# Patient Record
Sex: Male | Born: 1975
Health system: Southern US, Community
[De-identification: ages and names within clinical notes are randomized; demographics above are authoritative.]

## PROBLEM LIST (undated history)

## (undated) DIAGNOSIS — D61818 Other pancytopenia: Secondary | ICD-10-CM

## (undated) DIAGNOSIS — E11621 Type 2 diabetes mellitus with foot ulcer: Secondary | ICD-10-CM

## (undated) DIAGNOSIS — T1491XA Suicide attempt, initial encounter: Secondary | ICD-10-CM

## (undated) DIAGNOSIS — F99 Mental disorder, not otherwise specified: Secondary | ICD-10-CM

## (undated) DIAGNOSIS — F32A Depression, unspecified: Secondary | ICD-10-CM

## (undated) DIAGNOSIS — L97511 Non-pressure chronic ulcer of other part of right foot limited to breakdown of skin: Secondary | ICD-10-CM

## (undated) DIAGNOSIS — E119 Type 2 diabetes mellitus without complications: Secondary | ICD-10-CM

## (undated) DIAGNOSIS — F329 Major depressive disorder, single episode, unspecified: Secondary | ICD-10-CM

## (undated) DIAGNOSIS — K219 Gastro-esophageal reflux disease without esophagitis: Secondary | ICD-10-CM

## (undated) DIAGNOSIS — K7460 Unspecified cirrhosis of liver: Secondary | ICD-10-CM

## (undated) DIAGNOSIS — M869 Osteomyelitis, unspecified: Secondary | ICD-10-CM

## (undated) DIAGNOSIS — L0390 Cellulitis, unspecified: Secondary | ICD-10-CM

## (undated) DIAGNOSIS — L02611 Cutaneous abscess of right foot: Secondary | ICD-10-CM

## (undated) DIAGNOSIS — L97509 Non-pressure chronic ulcer of other part of unspecified foot with unspecified severity: Secondary | ICD-10-CM

## (undated) DIAGNOSIS — L03115 Cellulitis of right lower limb: Secondary | ICD-10-CM

## (undated) DIAGNOSIS — E1100 Type 2 diabetes mellitus with hyperosmolarity without nonketotic hyperglycemic-hyperosmolar coma (NKHHC): Secondary | ICD-10-CM

## (undated) DIAGNOSIS — T300 Burn of unspecified body region, unspecified degree: Secondary | ICD-10-CM

## (undated) DIAGNOSIS — E11628 Type 2 diabetes mellitus with other skin complications: Secondary | ICD-10-CM

## (undated) DIAGNOSIS — F1010 Alcohol abuse, uncomplicated: Secondary | ICD-10-CM

## (undated) DIAGNOSIS — K7010 Alcoholic hepatitis without ascites: Secondary | ICD-10-CM

## (undated) HISTORY — PX: LEG SURGERY: SHX1003

## (undated) HISTORY — DX: Suicide attempt, initial encounter: T14.91XA

## (undated) HISTORY — PX: SKIN GRAFT: SHX250

---
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
## 2009-09-25 ENCOUNTER — Emergency Department (HOSPITAL_COMMUNITY): Admission: EM | Admit: 2009-09-25 | Discharge: 2009-09-25 | Payer: Self-pay | Admitting: Emergency Medicine

## 2010-12-16 ENCOUNTER — Inpatient Hospital Stay (HOSPITAL_COMMUNITY)
Admission: EM | Admit: 2010-12-16 | Discharge: 2010-12-21 | DRG: 540 | Disposition: A | Discharge status: Home / Self Care | Payer: Medicaid Other | Attending: Internal Medicine | Admitting: Internal Medicine

## 2010-12-16 ENCOUNTER — Encounter: Payer: Self-pay | Admitting: Emergency Medicine

## 2010-12-16 DIAGNOSIS — L02619 Cutaneous abscess of unspecified foot: Secondary | ICD-10-CM | POA: Diagnosis present

## 2010-12-16 DIAGNOSIS — R7402 Elevation of levels of lactic acid dehydrogenase (LDH): Secondary | ICD-10-CM | POA: Diagnosis present

## 2010-12-16 DIAGNOSIS — L03115 Cellulitis of right lower limb: Secondary | ICD-10-CM

## 2010-12-16 DIAGNOSIS — T148XXA Other injury of unspecified body region, initial encounter: Secondary | ICD-10-CM

## 2010-12-16 DIAGNOSIS — W19XXXA Unspecified fall, initial encounter: Secondary | ICD-10-CM | POA: Diagnosis present

## 2010-12-16 DIAGNOSIS — S2020XA Contusion of thorax, unspecified, initial encounter: Secondary | ICD-10-CM | POA: Diagnosis present

## 2010-12-16 DIAGNOSIS — E876 Hypokalemia: Secondary | ICD-10-CM | POA: Diagnosis not present

## 2010-12-16 DIAGNOSIS — K7010 Alcoholic hepatitis without ascites: Secondary | ICD-10-CM | POA: Diagnosis present

## 2010-12-16 DIAGNOSIS — T65891A Toxic effect of other specified substances, accidental (unintentional), initial encounter: Secondary | ICD-10-CM | POA: Diagnosis present

## 2010-12-16 DIAGNOSIS — R7401 Elevation of levels of liver transaminase levels: Secondary | ICD-10-CM | POA: Diagnosis present

## 2010-12-16 DIAGNOSIS — F10939 Alcohol use, unspecified with withdrawal, unspecified: Secondary | ICD-10-CM | POA: Diagnosis present

## 2010-12-16 DIAGNOSIS — A4901 Methicillin susceptible Staphylococcus aureus infection, unspecified site: Secondary | ICD-10-CM | POA: Diagnosis present

## 2010-12-16 DIAGNOSIS — F1020 Alcohol dependence, uncomplicated: Secondary | ICD-10-CM | POA: Diagnosis present

## 2010-12-16 DIAGNOSIS — J9819 Other pulmonary collapse: Secondary | ICD-10-CM | POA: Diagnosis present

## 2010-12-16 DIAGNOSIS — K2920 Alcoholic gastritis without bleeding: Secondary | ICD-10-CM | POA: Diagnosis present

## 2010-12-16 DIAGNOSIS — E871 Hypo-osmolality and hyponatremia: Secondary | ICD-10-CM | POA: Diagnosis not present

## 2010-12-16 DIAGNOSIS — F1010 Alcohol abuse, uncomplicated: Secondary | ICD-10-CM | POA: Diagnosis present

## 2010-12-16 DIAGNOSIS — T510X4A Toxic effect of ethanol, undetermined, initial encounter: Secondary | ICD-10-CM | POA: Diagnosis present

## 2010-12-16 DIAGNOSIS — D6959 Other secondary thrombocytopenia: Secondary | ICD-10-CM | POA: Diagnosis present

## 2010-12-16 DIAGNOSIS — M869 Osteomyelitis, unspecified: Principal | ICD-10-CM | POA: Diagnosis present

## 2010-12-16 DIAGNOSIS — F10239 Alcohol dependence with withdrawal, unspecified: Secondary | ICD-10-CM | POA: Diagnosis present

## 2010-12-16 HISTORY — DX: Gastro-esophageal reflux disease without esophagitis: K21.9

## 2010-12-16 HISTORY — DX: Burn of unspecified body region, unspecified degree: T30.0
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
## 2010-12-16 NOTE — ED Notes (Signed)
Spanish speaker states bleeding of right foot x 3 wks getting worse.  Denies injury.  Surgery 18 years ago to same foot. Feels bad.

## 2010-12-16 NOTE — ED Notes (Signed)
Pt has an ulcer noted on the bottom of his right foot at the ball of the foot and a large scar to the bottom of the foot in the middle  Bleeding noted

## 2010-12-17 ENCOUNTER — Emergency Department (HOSPITAL_COMMUNITY): Payer: Medicaid Other

## 2010-12-17 ENCOUNTER — Observation Stay (HOSPITAL_COMMUNITY): Payer: Medicaid Other

## 2010-12-17 ENCOUNTER — Other Ambulatory Visit: Payer: Self-pay | Admitting: Orthopedic Surgery

## 2010-12-17 ENCOUNTER — Encounter (HOSPITAL_COMMUNITY): Payer: Self-pay | Admitting: Emergency Medicine

## 2010-12-17 DIAGNOSIS — L03115 Cellulitis of right lower limb: Secondary | ICD-10-CM

## 2010-12-17 DIAGNOSIS — M869 Osteomyelitis, unspecified: Secondary | ICD-10-CM | POA: Diagnosis present

## 2010-12-17 LAB — DIFFERENTIAL
Basophils Relative: 1 % (ref 0–1)
Eosinophils Absolute: 0.1 10*3/uL (ref 0.0–0.7)
Lymphocytes Relative: 32 % (ref 12–46)
Monocytes Absolute: 0.3 10*3/uL (ref 0.1–1.0)
Neutrophils Relative %: 58 % (ref 43–77)

## 2010-12-17 LAB — CBC
Hemoglobin: 14.4 g/dL (ref 13.0–17.0)
MCH: 34.6 pg — ABNORMAL HIGH (ref 26.0–34.0)
MCHC: 35.9 g/dL (ref 30.0–36.0)
Platelets: 61 10*3/uL — ABNORMAL LOW (ref 150–400)
RBC: 4.16 MIL/uL — ABNORMAL LOW (ref 4.22–5.81)

## 2010-12-17 LAB — HEPATIC FUNCTION PANEL
ALT: 48 U/L (ref 0–53)
AST: 110 U/L — ABNORMAL HIGH (ref 0–37)
Bilirubin, Direct: 0.3 mg/dL (ref 0.0–0.3)
Total Bilirubin: 0.7 mg/dL (ref 0.3–1.2)

## 2010-12-17 LAB — BASIC METABOLIC PANEL
Calcium: 8.1 mg/dL — ABNORMAL LOW (ref 8.4–10.5)
GFR calc Af Amer: >90 mL/min (ref >90)
GFR calc non Af Amer: >90 mL/min (ref >90)
Glucose, Bld: 148 mg/dL — ABNORMAL HIGH (ref 70–99)
Potassium: 3.1 mEq/L — ABNORMAL LOW (ref 3.5–5.1)
Sodium: 137 mEq/L (ref 135–145)

## 2010-12-17 LAB — VITAMIN B12: Vitamin B-12: 1520 pg/mL — ABNORMAL HIGH (ref 211–911)

## 2010-12-17 LAB — MRSA PCR SCREENING: MRSA by PCR: NEGATIVE

## 2010-12-17 LAB — GLUCOSE, CAPILLARY: Glucose-Capillary: 138 mg/dL — ABNORMAL HIGH (ref 70–99)

## 2010-12-17 LAB — HIV ANTIBODY (ROUTINE TESTING W REFLEX): HIV: NONREACTIVE

## 2010-12-17 MED ORDER — SODIUM CHLORIDE 0.9 % IJ SOLN: 3.0000 mL | INTRAMUSCULAR | Status: DC | PRN

## 2010-12-17 MED ORDER — POTASSIUM CHLORIDE CRYS ER 20 MEQ PO TBCR
40.0000 meq | EXTENDED_RELEASE_TABLET | Freq: Once | ORAL | Status: AC
  Administered 2010-12-17: 40 meq via ORAL
  Filled 2010-12-17: qty 1

## 2010-12-17 MED ORDER — VANCOMYCIN HCL IN DEXTROSE 1-5 GM/200ML-% IV SOLN
1000.0000 mg | INTRAVENOUS | Status: AC
  Administered 2010-12-17: 1000 mg via INTRAVENOUS
  Filled 2010-12-17 (×2): qty 200

## 2010-12-17 MED ORDER — THERA M PLUS PO TABS
1.0000 | ORAL_TABLET | Freq: Every day | ORAL | Status: DC
  Administered 2010-12-17 – 2010-12-21 (×5): 1 via ORAL
  Filled 2010-12-17 (×6): qty 1

## 2010-12-17 MED ORDER — PIPERACILLIN-TAZOBACTAM 3.375 G IVPB
3.3750 g | Freq: Three times a day (TID) | INTRAVENOUS | Status: DC
  Administered 2010-12-17 – 2010-12-21 (×12): 3.375 g via INTRAVENOUS
  Filled 2010-12-17 (×15): qty 50

## 2010-12-17 MED ORDER — ENOXAPARIN SODIUM 40 MG/0.4ML ~~LOC~~ SOLN
40.0000 mg | SUBCUTANEOUS | Status: DC
  Filled 2010-12-17: qty 0.4

## 2010-12-17 MED ORDER — NICOTINE 14 MG/24HR TD PT24
14.0000 mg | MEDICATED_PATCH | Freq: Every day | TRANSDERMAL | Status: DC
  Administered 2010-12-18 – 2010-12-21 (×4): 14 mg via TRANSDERMAL
  Filled 2010-12-17 (×5): qty 1

## 2010-12-17 MED ORDER — MORPHINE SULFATE 4 MG/ML IJ SOLN
4.0000 mg | Freq: Once | INTRAMUSCULAR | Status: AC
  Administered 2010-12-17: 4 mg via INTRAVENOUS
  Filled 2010-12-17 (×2): qty 1

## 2010-12-17 MED ORDER — CHLORDIAZEPOXIDE HCL 25 MG PO CAPS: 25.0000 mg | ORAL_CAPSULE | Freq: Three times a day (TID) | ORAL | Status: DC

## 2010-12-17 MED ORDER — PIPERACILLIN-TAZOBACTAM 3.375 G IVPB 30 MIN
3.3750 g | INTRAVENOUS | Status: AC
  Administered 2010-12-17: 3.375 g via INTRAVENOUS
  Filled 2010-12-17: qty 50

## 2010-12-17 MED ORDER — ACETAMINOPHEN 650 MG RE SUPP: 650.0000 mg | Freq: Four times a day (QID) | RECTAL | Status: DC | PRN

## 2010-12-17 MED ORDER — ONDANSETRON HCL 4 MG PO TABS: 4.0000 mg | ORAL_TABLET | Freq: Four times a day (QID) | ORAL | Status: DC | PRN

## 2010-12-17 MED ORDER — LORAZEPAM 1 MG PO TABS
0.0000 mg | ORAL_TABLET | Freq: Four times a day (QID) | ORAL | Status: AC
  Administered 2010-12-17 (×2): 2 mg via ORAL
  Administered 2010-12-17 – 2010-12-19 (×5): 1 mg via ORAL
  Filled 2010-12-17: qty 2

## 2010-12-17 MED ORDER — PANTOPRAZOLE SODIUM 40 MG PO TBEC
40.0000 mg | DELAYED_RELEASE_TABLET | Freq: Every day | ORAL | Status: DC
  Administered 2010-12-18 – 2010-12-21 (×4): 40 mg via ORAL
  Filled 2010-12-17 (×6): qty 1

## 2010-12-17 MED ORDER — GADOBENATE DIMEGLUMINE 529 MG/ML IV SOLN
15.0000 mL | Freq: Once | INTRAVENOUS | Status: AC | PRN
  Administered 2010-12-17: 15 mL via INTRAVENOUS

## 2010-12-17 MED ORDER — LORAZEPAM 1 MG PO TABS
1.0000 mg | ORAL_TABLET | Freq: Four times a day (QID) | ORAL | Status: AC | PRN
  Administered 2010-12-18: 1 mg via ORAL
  Filled 2010-12-17 (×5): qty 1
  Filled 2010-12-17: qty 2
  Filled 2010-12-17 (×3): qty 1

## 2010-12-17 MED ORDER — FOLIC ACID 1 MG PO TABS
1.0000 mg | ORAL_TABLET | Freq: Every day | ORAL | Status: DC
  Administered 2010-12-18 – 2010-12-21 (×4): 1 mg via ORAL
  Filled 2010-12-17 (×5): qty 1

## 2010-12-17 MED ORDER — ONDANSETRON HCL 4 MG/2ML IJ SOLN: 4.0000 mg | Freq: Four times a day (QID) | INTRAMUSCULAR | Status: DC | PRN

## 2010-12-17 MED ORDER — CLINDAMYCIN HCL 150 MG PO CAPS: 150.0000 mg | ORAL_CAPSULE | Freq: Four times a day (QID) | ORAL | Status: DC

## 2010-12-17 MED ORDER — ALUM & MAG HYDROXIDE-SIMETH 200-200-20 MG/5ML PO SUSP
30.0000 mL | Freq: Four times a day (QID) | ORAL | Status: DC | PRN
  Administered 2010-12-17: 30 mL via ORAL
  Filled 2010-12-17: qty 30

## 2010-12-17 MED ORDER — LORAZEPAM 1 MG PO TABS
0.0000 mg | ORAL_TABLET | Freq: Two times a day (BID) | ORAL | Status: AC
  Administered 2010-12-19 – 2010-12-20 (×3): 1 mg via ORAL
  Filled 2010-12-17: qty 1

## 2010-12-17 MED ORDER — TETANUS-DIPHTH-ACELL PERTUSSIS 5-2.5-18.5 LF-MCG/0.5 IM SUSP
0.5000 mL | Freq: Once | INTRAMUSCULAR | Status: AC
  Administered 2010-12-17: 0.5 mL via INTRAMUSCULAR
  Filled 2010-12-17: qty 0.5

## 2010-12-17 MED ORDER — MORPHINE SULFATE 2 MG/ML IJ SOLN: 2.0000 mg | INTRAMUSCULAR | Status: DC | PRN

## 2010-12-17 MED ORDER — FOLIC ACID 1 MG PO TABS: 1.0000 mg | ORAL_TABLET | Freq: Every day | ORAL | Status: DC

## 2010-12-17 MED ORDER — ACETAMINOPHEN 325 MG PO TABS: 650.0000 mg | ORAL_TABLET | Freq: Four times a day (QID) | ORAL | Status: DC | PRN

## 2010-12-17 MED ORDER — OXYCODONE-ACETAMINOPHEN 5-325 MG PO TABS: 1.0000 | ORAL_TABLET | ORAL | Status: AC | PRN

## 2010-12-17 MED ORDER — LORAZEPAM 2 MG/ML IJ SOLN: 1.0000 mg | Freq: Four times a day (QID) | INTRAMUSCULAR | Status: AC | PRN

## 2010-12-17 MED ORDER — VANCOMYCIN HCL IN DEXTROSE 1-5 GM/200ML-% IV SOLN
1000.0000 mg | Freq: Once | INTRAVENOUS | Status: AC
  Administered 2010-12-17: 1000 mg via INTRAVENOUS
  Filled 2010-12-17: qty 200

## 2010-12-17 MED ORDER — VANCOMYCIN HCL IN DEXTROSE 1-5 GM/200ML-% IV SOLN
1000.0000 mg | Freq: Three times a day (TID) | INTRAVENOUS | Status: DC
  Administered 2010-12-17 – 2010-12-19 (×5): 1000 mg via INTRAVENOUS
  Filled 2010-12-17 (×8): qty 200

## 2010-12-17 MED ORDER — THIAMINE HCL 100 MG/ML IJ SOLN
100.0000 mg | Freq: Every day | INTRAMUSCULAR | Status: DC
  Filled 2010-12-17 (×5): qty 2

## 2010-12-17 MED ORDER — HYDROCODONE-ACETAMINOPHEN 5-325 MG PO TABS
1.0000 | ORAL_TABLET | ORAL | Status: DC | PRN
  Administered 2010-12-17 – 2010-12-20 (×3): 2 via ORAL
  Filled 2010-12-17 (×3): qty 2

## 2010-12-17 MED ORDER — VITAMIN B-1 100 MG PO TABS
100.0000 mg | ORAL_TABLET | Freq: Every day | ORAL | Status: DC
  Administered 2010-12-18 – 2010-12-21 (×4): 100 mg via ORAL
  Filled 2010-12-17 (×5): qty 1

## 2010-12-17 MED ORDER — VITAMIN B-1 100 MG PO TABS: 100.0000 mg | ORAL_TABLET | Freq: Every day | ORAL | Status: DC

## 2010-12-17 MED ORDER — CIPROFLOXACIN HCL 500 MG PO TABS: 500.0000 mg | ORAL_TABLET | Freq: Two times a day (BID) | ORAL | Status: DC
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
## 2010-12-17 NOTE — ED Notes (Signed)
Radiology staff in ED, asked about MRI, was told Mitchell [name] was called in and would be here in about 30 minutes, will monitor.
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
## 2010-12-17 NOTE — ED Notes (Signed)
Called report to Lyda Perone, RN, awaiting STAT MR of R foot prior to transferring pt, attempted to call MRI and Radiology but no answer, will inform Diane, Consulting civil engineer and monitor.

## 2010-12-17 NOTE — ED Notes (Signed)
Collected first set of blood cultures in the right forearm vein

## 2010-12-17 NOTE — ED Notes (Signed)
Pt transported to 1538 accompanied by [place], NT with meds, chart and personal belongings, condition stable at time of transfer.
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
## 2010-12-17 NOTE — Progress Notes (Signed)
Admission Note: Patient speaks spanish and no english. RN used [employer] and spanish interpretor to complete admission assessment and complete floor introduction. Sheran Luz RN BSN
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
## 2010-12-17 NOTE — ED Notes (Signed)
Pt. had second set of blood cultures drawn. Blood was drawn out of the right hand with three cc in both the bottles.

## 2010-12-18 LAB — CBC
HCT: 40.8 % (ref 39.0–52.0)
MCHC: 34.8 g/dL (ref 30.0–36.0)
Platelets: 64 10*3/uL — ABNORMAL LOW (ref 150–400)
RDW: 12.9 % (ref 11.5–15.5)
WBC: 3.9 10*3/uL — ABNORMAL LOW (ref 4.0–10.5)

## 2010-12-18 LAB — BASIC METABOLIC PANEL
BUN: 4 mg/dL — ABNORMAL LOW (ref 6–23)
Chloride: 96 mEq/L (ref 96–112)
Creatinine, Ser: 0.44 mg/dL — ABNORMAL LOW (ref 0.50–1.35)
GFR calc Af Amer: >90 mL/min (ref >90)
GFR calc non Af Amer: >90 mL/min (ref >90)

## 2010-12-18 LAB — HEPATIC FUNCTION PANEL
AST: 117 U/L — ABNORMAL HIGH (ref 0–37)
Albumin: 3.1 g/dL — ABNORMAL LOW (ref 3.5–5.2)
Total Protein: 8.4 g/dL — ABNORMAL HIGH (ref 6.0–8.3)

## 2010-12-18 LAB — MAGNESIUM: Magnesium: 1.8 mg/dL (ref 1.5–2.5)

## 2010-12-18 MED ORDER — MAGNESIUM OXIDE 400 MG PO TABS
200.0000 mg | ORAL_TABLET | Freq: Every day | ORAL | Status: DC
  Administered 2010-12-18 – 2010-12-21 (×4): 200 mg via ORAL
  Filled 2010-12-18 (×6): qty 0.5

## 2010-12-18 MED ORDER — POTASSIUM CHLORIDE CRYS ER 20 MEQ PO TBCR
40.0000 meq | EXTENDED_RELEASE_TABLET | Freq: Every day | ORAL | Status: DC
  Administered 2010-12-18 – 2010-12-21 (×4): 40 meq via ORAL
  Filled 2010-12-18 (×5): qty 2

## 2010-12-18 MED ORDER — POTASSIUM CHLORIDE CRYS ER 20 MEQ PO TBCR
40.0000 meq | EXTENDED_RELEASE_TABLET | Freq: Once | ORAL | Status: AC
  Administered 2010-12-18: 40 meq via ORAL
  Filled 2010-12-18: qty 2

## 2010-12-18 MED ORDER — POTASSIUM CHLORIDE 10 MEQ/100ML IV SOLN
10.0000 meq | INTRAVENOUS | Status: AC
  Administered 2010-12-18 (×3): 10 meq via INTRAVENOUS
  Filled 2010-12-18 (×3): qty 100

## 2010-12-18 NOTE — Progress Notes (Signed)
Subjective: Pain right foot better. No nausea or vomiting , feeling ok.  Objective: Filed Vitals:   12/17/10 2130 12/18/10 0145 12/18/10 0500 12/18/10 1200  BP: 124/76 120/77 125/84 127/86  Pulse: 80 78 81 89  Temp: 98.9 F (37.2 C) 98.4 F (36.9 C) 98.6 F (37 C) 98.4 F (36.9 C)  TempSrc: Oral Oral Oral Oral  Resp: 18 18 18 18   Height:      Weight:      SpO2: 98% 95% 98% 97%   Weight change:   Intake/Output Summary (Last 24 hours) at 12/18/10 1244 Last data filed at 12/18/10 0600  Gross per 24 hour  Intake   1150 ml  Output    150 ml  Net   1000 ml    General: Alert, awake, oriented x3, in no acute distress.  HEENT: No bruits, no goiter.  Heart: Regular rate and rhythm, without murmurs, rubs, gallops.  Lungs: Crackles left side, bilateral air movement.  Abdomen: Soft, nontender, nondistended, positive bowel sounds.  Neuro: Grossly intact, nonfocal. Extremities: Right foot swelling decrease, redness has decrease.    Lab Results:  [facility] 12/18/10 0420 12/17/10 0210  NA 133* 137  K 2.9* 3.1*  CL 96 100  CO2 29 26  GLUCOSE 154* 148*  BUN 4* 4*  CREATININE 0.44* 0.44*  CALCIUM 8.6 8.1*  MG 1.8 --  PHOS -- --    Basename 12/18/10 0420 12/17/10 0555  AST 117* 110*  ALT 47 48  ALKPHOS 93 92  BILITOT 1.4* 0.7  PROT 8.4* 8.3  ALBUMIN 3.1* 3.1*   No results found for this basename: LIPASE:2,AMYLASE:2 in the last 72 hours  Basename 12/18/10 0420 12/17/10 0210  WBC 3.9* 4.6  NEUTROABS -- 2.7  HGB 14.2 14.4  HCT 40.8 40.1  MCV 98.1 96.4  PLT 64* 61*    Basename 12/17/10 1431  VITAMINB12 1520*  FOLATE --  FERRITIN --  TIBC --  IRON --  RETICCTPCT --    Micro Results: Recent Results (from the past 240 hour(s))  CULTURE, BLOOD (ROUTINE X 2)     Status: Normal (Preliminary result)   Collection Time   12/17/10 10:23 AM      Component Value Range Status Comment   Specimen Description BLOOD RIGHT FOREARM   Final    Special Requests BOTTLES DRAWN  AEROBIC AND ANAEROBIC 5 CC EACH   Final    Setup Time [id number]   Final    Culture     Final    Value:        BLOOD CULTURE RECEIVED NO GROWTH TO DATE CULTURE WILL BE HELD FOR 5 DAYS BEFORE ISSUING A FINAL NEGATIVE REPORT   Report Status PENDING   Incomplete   CULTURE, BLOOD (ROUTINE X 2)     Status: Normal (Preliminary result)   Collection Time   12/17/10 11:00 AM      Component Value Range Status Comment   Specimen Description BLOOD   Final    Special Requests BOTTLES DRAWN AEROBIC AND ANAEROBIC 3 CC EACH   Final    Setup Time [id number]   Final    Culture     Final    Value:        BLOOD CULTURE RECEIVED NO GROWTH TO DATE CULTURE WILL BE HELD FOR 5 DAYS BEFORE ISSUING A FINAL NEGATIVE REPORT   Report Status PENDING   Incomplete   WOUND CULTURE     Status: Normal (Preliminary result)   Collection Time  12/17/10  3:16 PM      Component Value Range Status Comment   Specimen Description FOOT   Final    Special Requests Normal   Final    Gram Stain     Final    Value: NO WBC SEEN     NO SQUAMOUS EPITHELIAL CELLS SEEN     NO ORGANISMS SEEN   Culture Culture reincubated for better growth   Final    Report Status PENDING   Incomplete   MRSA PCR SCREENING     Status: Normal   Collection Time   12/17/10  3:16 PM      Component Value Range Status Comment   MRSA by PCR NEGATIVE  NEGATIVE  Final     Studies/Results: Dg Chest 2 View  12/17/2010  *RADIOLOGY REPORT*  Clinical Data: Bruising, elevated liver enzymes.  CHEST - 2 VIEW  Comparison: None.  Findings: Hypoaeration results in interstitial crowding.  There is mild left lower lobe consolidation.  No pneumothorax.  Heart size mildly enlarged.  No acute osseous abnormality.  IMPRESSION: Hypoaeration results in interstitial crowding.  Mild left lower lobe opacity; atelectasis versus pneumonia.  Original Report Authenticated By: Waneta Martins, M.D.   Dg Abd 1 View  12/17/2010  *RADIOLOGY REPORT*  Clinical Data: Bruising,  tenderness, elevated liver enzyme.  ABDOMEN - 1 VIEW  Comparison: None.  Findings: Single frontal radiograph of the abdomen.  The upper abdomen/hemidiaphragms and the lower pelvis are excluded from the image.  Nonobstructive bowel gas pattern.  Organ outlines are normal where seen.  No acute osseous abnormality identified. Sacroiliac joints are intact.  The lower sacrum is obscured by overlying bowel.  The inferior pubic rami are not included on the image.  IMPRESSION: Nonobstructive bowel gas pattern.  Original Report Authenticated By: Waneta Martins, M.D.   Mr Foot Right W Wo Contrast  12/17/2010  *RADIOLOGY REPORT*  Clinical Data: Open wound plantar surface of the forefoot.  MRI OF THE RIGHT FOREFOOT WITHOUT AND WITH CONTRAST  Technique:  Multiplanar, multisequence MR imaging was performed both before and after administration of intravenous contrast.  Contrast: 15mL MULTIHANCE GADOBENATE DIMEGLUMINE 529 MG/ML IV SOLN  Comparison: Plain films right foot 12/17/2010 and 09/25/2009.  Findings: Fusion of the first MTP joint and synostosis across the distal first and second and second and third metatarsals is noted. Osseous fusion throughout the midfoot is also seen. The patient has severe talonavicular degenerative change.  There is a wound on the plantar surface of the forefoot with underlying enhancement compatible with cellulitis or granulation tissue.  Flattening of the plantar surfaces of the heads of the first, second and third metatarsals is noted and seen on the comparison plain films.  No focal fluid collection to suggest abscess is identified.  There is mild edema and post contrast enhancement in the plantar surface of the head of the first metatarsal. There also appears to be some soft tissue edema and enhancement subjacent to the nail of the great toe.  No focal fluid collection is identified in this location.  No definite osseous enhancement.  IMPRESSION:  1.  Plantar soft tissue wound with post  contrast enhancement compatible with the presence of granulation tissue or cellulitis. No abscess. 2.  Chronic erosions in the heads of the first, second and third metatarsals are likely pressure related.  Mild edema in the plantar surface of the head of the first metatarsal could be due to stress change or possibly osteomyelitis.  Stress change is  favored. 3.  Soft tissue edema and enhancement subjacent to the nail of the great toe may be due to cellulitis.  No abscess or convincing evidence of osteomyelitis. 4.  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  Osseous fusion of the midfoot and first MTP joint also seen.  Original Report Authenticated By: Bernadene Bell. D'ALESSIO, M.D.   Dg Foot Complete Right  12/17/2010  *RADIOLOGY REPORT*  Clinical Data: Right foot pain, swelling.  RIGHT FOOT COMPLETE - 3+ VIEW  Comparison: 09/25/2009  Findings: Diffuse osteopenia.  Multiple tarsal bone fusion, similar to prior.  Advanced midfoot and phalangeal degenerative changes. Large first metacarpal phalangeal joint spur, similar to prior. There is increased soft tissue swelling.  IMPRESSION: Increased soft tissue swelling.  Chronic osseous findings are similar to prior.  No definite acute osseous change.  Original Report Authenticated By: Waneta Martins, M.D.    Medications: I have reviewed the patient's current medications.   Patient Active Hospital Problem List:  1Right foot cellulitis: Chronic changes of osteomyelitis right foot.  Continue with Vancomycin and zosyn day 2 Appreciate ortho help. No surgery at this time. Patient will need to follow up with Dr Montez Morita in 1 week.  I will discharge patient on Ciprofloxacin and bactrim.  2Alcohol Abuse:  CIWA protocol. . Mild increase bilirubin and AST.  Thiamine and Folate.  3DVT Prophylaxis  SCD, No Lovenox due to thrombocytopenia.  4Thrombocytopenia: Likely secondary to alcohol use. Monitor.   5Nausea and vomiting :  Resolved. ? Due to alcoholic gastritis  Protonix ordered an anti-emetics  6Generalized torso bruising  After a fall.  the KUB and chest x-ray negative.  7Chest Xray atelectasis Vs PNA:  Already on antibiotics , facvor atelectasis.  8-Hypokalemia: will replete with kCL 10 meq IV times 3 run and 40 meq po times 2.  9-Transaminases: Likely secondary to alcohol, repeat in am. Will check PT.      LOS: 2 days   Lewellyn Fultz M.D.  [facility] 12/18/2010, 12:44 PM

## 2010-12-18 NOTE — Progress Notes (Signed)
Pt speaks Spanish, no other needs. Staff needs to use pacific due to holiday coverage.  Mitchell [name] 12/18/2010 8:50 AM

## 2010-12-18 NOTE — Consult Note (Signed)
WOC consult Note Reason for Consult:Right foot wound  Patient not seen in consultation as ortho consult requested simultaneously.  Dr. Myrtie Neither has seen and provided orders for conservative wound care in his note.  I will transcribe these to the nursing orders for continuing care until patient is seen by Dr. Montez Morita in his office in 1 week. I will not follow.  Please re-consult if needed. Thanks, Ladona Mow, MSN, RN, [facility], CWOCN [phone])

## 2010-12-19 LAB — CBC
Hemoglobin: 14.6 g/dL (ref 13.0–17.0)
MCH: 33.6 pg (ref 26.0–34.0)
RBC: 4.35 MIL/uL (ref 4.22–5.81)
WBC: 3.8 10*3/uL — ABNORMAL LOW (ref 4.0–10.5)

## 2010-12-19 LAB — COMPREHENSIVE METABOLIC PANEL
AST: 163 U/L — ABNORMAL HIGH (ref 0–37)
Albumin: 3.1 g/dL — ABNORMAL LOW (ref 3.5–5.2)
BUN: 4 mg/dL — ABNORMAL LOW (ref 6–23)
CO2: 25 mEq/L (ref 19–32)
Calcium: 8.6 mg/dL (ref 8.4–10.5)
Creatinine, Ser: 0.49 mg/dL — ABNORMAL LOW (ref 0.50–1.35)
GFR calc non Af Amer: >90 mL/min (ref >90)

## 2010-12-19 LAB — PROTIME-INR: INR: 1.45 (ref 0.00–1.49)

## 2010-12-19 LAB — VANCOMYCIN, TROUGH: Vancomycin Tr: 8.7 ug/mL — ABNORMAL LOW (ref 10.0–20.0)

## 2010-12-19 LAB — APTT: aPTT: 46 seconds — ABNORMAL HIGH (ref 24–37)

## 2010-12-19 MED ORDER — VANCOMYCIN HCL 1000 MG IV SOLR
1750.0000 mg | Freq: Three times a day (TID) | INTRAVENOUS | Status: DC
  Administered 2010-12-19 – 2010-12-21 (×7): 1750 mg via INTRAVENOUS
  Filled 2010-12-19 (×9): qty 1750

## 2010-12-19 NOTE — Progress Notes (Signed)
Subjective:  Feeling better less tremors. Pain right foot better.  Objective: Filed Vitals:   12/18/10 2138 12/19/10 0029 12/19/10 0500 12/19/10 1322  BP: 124/90 118/82 133/87 113/76  Pulse: 86 77 80 82  Temp: 98.6 F (37 C) 98.5 F (36.9 C) 98.6 F (37 C) 98.1 F (36.7 C)  TempSrc: Oral Oral Oral Oral  Resp: 16 18 18 18   Height:      Weight:      SpO2: 96% 99% 98% 97%   Weight change:   Intake/Output Summary (Last 24 hours) at 12/19/10 1416 Last data filed at 12/19/10 0649  Gross per 24 hour  Intake    860 ml  Output      0 ml  Net    860 ml    General: Alert, awake, oriented x3, in no acute distress.  HEENT: No bruits, no goiter.  Heart: Regular rate and rhythm, without murmurs, rubs, gallops.  Lungs: Crackles left side, bilateral air movement.  Abdomen: Soft, nontender, nondistended, positive bowel sounds.  Neuro: Grossly intact, nonfocal. Extremities: Right foot with dressing less bleeding   Lab Results:  Basename 12/19/10 0500 12/18/10 0420  NA 131* 133*  K 3.4* 2.9*  CL 99 96  CO2 25 29  GLUCOSE 176* 154*  BUN 4* 4*  CREATININE 0.49* 0.44*  CALCIUM 8.6 8.6  MG -- 1.8  PHOS -- --    Basename 12/19/10 0500 12/18/10 0420  AST 163* 117*  ALT 63* 47  ALKPHOS 95 93  BILITOT 2.0* 1.4*  PROT 8.6* 8.4*  ALBUMIN 3.1* 3.1*   No results found for this basename: LIPASE:2,AMYLASE:2 in the last 72 hours  Basename 12/19/10 0500 12/18/10 0420 12/17/10 0210  WBC 3.8* 3.9* --  NEUTROABS -- -- 2.7  HGB 14.6 14.2 --  HCT 42.3 40.8 --  MCV 97.2 98.1 --  PLT 56* 64* --    Basename 12/17/10 1431  VITAMINB12 1520*  FOLATE --  FERRITIN --  TIBC --  IRON --  RETICCTPCT --    Micro Results: Recent Results (from the past 240 hour(s))  CULTURE, BLOOD (ROUTINE X 2)     Status: Normal (Preliminary result)   Collection Time   12/17/10 10:23 AM      Component Value Range Status Comment   Specimen Description BLOOD RIGHT FOREARM   Final    Special Requests  BOTTLES DRAWN AEROBIC AND ANAEROBIC 5 CC EACH   Final    Setup Time [id number]   Final    Culture     Final    Value:        BLOOD CULTURE RECEIVED NO GROWTH TO DATE CULTURE WILL BE HELD FOR 5 DAYS BEFORE ISSUING A FINAL NEGATIVE REPORT   Report Status PENDING   Incomplete   CULTURE, BLOOD (ROUTINE X 2)     Status: Normal (Preliminary result)   Collection Time   12/17/10 11:00 AM      Component Value Range Status Comment   Specimen Description BLOOD   Final    Special Requests BOTTLES DRAWN AEROBIC AND ANAEROBIC 3 CC EACH   Final    Setup Time [id number]   Final    Culture     Final    Value:        BLOOD CULTURE RECEIVED NO GROWTH TO DATE CULTURE WILL BE HELD FOR 5 DAYS BEFORE ISSUING A FINAL NEGATIVE REPORT   Report Status PENDING   Incomplete   WOUND CULTURE     Status: Normal (  Preliminary result)   Collection Time   12/17/10  3:16 PM      Component Value Range Status Comment   Specimen Description FOOT   Final    Special Requests Normal   Final    Gram Stain     Final    Value: NO WBC SEEN     NO SQUAMOUS EPITHELIAL CELLS SEEN     NO ORGANISMS SEEN   Culture Culture reincubated for better growth   Final    Report Status PENDING   Incomplete   MRSA PCR SCREENING     Status: Normal   Collection Time   12/17/10  3:16 PM      Component Value Range Status Comment   MRSA by PCR NEGATIVE  NEGATIVE  Final     Studies/Results: Mr Foot Right W Wo Contrast  12/17/2010  *RADIOLOGY REPORT*  Clinical Data: Open wound plantar surface of the forefoot.  MRI OF THE RIGHT FOREFOOT WITHOUT AND WITH CONTRAST  Technique:  Multiplanar, multisequence MR imaging was performed both before and after administration of intravenous contrast.  Contrast: 15mL MULTIHANCE GADOBENATE DIMEGLUMINE 529 MG/ML IV SOLN  Comparison: Plain films right foot 12/17/2010 and 09/25/2009.  Findings: Fusion of the first MTP joint and synostosis across the distal first and second and second and third metatarsals is noted.  Osseous fusion throughout the midfoot is also seen. The patient has severe talonavicular degenerative change.  There is a wound on the plantar surface of the forefoot with underlying enhancement compatible with cellulitis or granulation tissue.  Flattening of the plantar surfaces of the heads of the first, second and third metatarsals is noted and seen on the comparison plain films.  No focal fluid collection to suggest abscess is identified.  There is mild edema and post contrast enhancement in the plantar surface of the head of the first metatarsal. There also appears to be some soft tissue edema and enhancement subjacent to the nail of the great toe.  No focal fluid collection is identified in this location.  No definite osseous enhancement.  IMPRESSION:  1.  Plantar soft tissue wound with post contrast enhancement compatible with the presence of granulation tissue or cellulitis. No abscess. 2.  Chronic erosions in the heads of the first, second and third metatarsals are likely pressure related.  Mild edema in the plantar surface of the head of the first metatarsal could be due to stress change or possibly osteomyelitis.  Stress change is favored. 3.  Soft tissue edema and enhancement subjacent to the nail of the great toe may be due to cellulitis.  No abscess or convincing evidence of osteomyelitis. 4.  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  Osseous fusion of the midfoot and first MTP joint also seen.  Original Report Authenticated By: Bernadene Bell. Maricela Curet, M.D.    Medications: I have reviewed the patient's current medications.   Patient Active Hospital Problem List:  1Right foot cellulitis: Chronic changes of osteomyelitis right foot.  Continue with Vancomycin and zosyn day 3  Appreciate ortho help. No surgery at this time. Patient will need to follow up with Dr Montez Morita in 1 week.  I will discharge patient on Ciprofloxacin and bactrim probably. Culture  pending.   2Alcohol Abuse:  CIWA protocol. . Mild increase bilirubin and AST.  Thiamine and Folate.  3DVT Prophylaxis  SCD, No Lovenox due to thrombocytopenia.  4Thrombocytopenia: Likely secondary to alcohol use. Monitor.   5Nausea and vomiting : Resolved.  ?  Due to alcoholic gastritis  Protonix ordered an anti-emetics  6Generalized torso bruising  After a fall.  the KUB and chest x-ray negative.  7Chest Xray atelectasis Vs PNA:  Already on antibiotics , favor atelectasis.  8-Hypokalemia: will replete with kcl daily. 9-Transaminases: Likely secondary to alcohol, mild increase, Repeat in am. I will check liver US. INR 1.4. Hyponatremia : probably secondary to potomania.      LOS: 3 days   Karis Rilling M.D.  [facility] 12/19/2010, 2:16 PM
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
## 2010-12-20 ENCOUNTER — Inpatient Hospital Stay (HOSPITAL_COMMUNITY): Payer: Medicaid Other

## 2010-12-20 LAB — CBC
HCT: 41.1 % (ref 39.0–52.0)
Hemoglobin: 14 g/dL (ref 13.0–17.0)
MCHC: 34.1 g/dL (ref 30.0–36.0)
RBC: 4.19 MIL/uL — ABNORMAL LOW (ref 4.22–5.81)

## 2010-12-20 LAB — COMPREHENSIVE METABOLIC PANEL
ALT: 79 U/L — ABNORMAL HIGH (ref 0–53)
Alkaline Phosphatase: 93 U/L (ref 39–117)
BUN: 6 mg/dL (ref 6–23)
CO2: 26 mEq/L (ref 19–32)
Chloride: 102 mEq/L (ref 96–112)
GFR calc Af Amer: >90 mL/min (ref >90)
GFR calc non Af Amer: >90 mL/min (ref >90)
Glucose, Bld: 146 mg/dL — ABNORMAL HIGH (ref 70–99)
Potassium: 3.6 mEq/L (ref 3.5–5.1)
Total Bilirubin: 2.1 mg/dL — ABNORMAL HIGH (ref 0.3–1.2)
Total Protein: 8.1 g/dL (ref 6.0–8.3)

## 2010-12-20 LAB — VANCOMYCIN, TROUGH: Vancomycin Tr: 22 ug/mL — ABNORMAL HIGH (ref 10.0–20.0)

## 2010-12-20 NOTE — Progress Notes (Signed)
Subjective: Feeling ok, pain better, swelling foot has decreased.  Objective: Filed Vitals:   12/19/10 1748 12/19/10 2206 12/20/10 0619 12/20/10 1335  BP: 112/68 99/64 106/69 106/71  Pulse: 74 71 73 87  Temp: 98.2 F (36.8 C) 98.2 F (36.8 C) 98.2 F (36.8 C) 98.1 F (36.7 C)  TempSrc: Oral Oral Oral Oral  Resp: 18 20 20 18   Height:      Weight:      SpO2: 97% 99% 98% 95%   Weight change:   Intake/Output Summary (Last 24 hours) at 12/20/10 1431 Last data filed at 12/20/10 1610  Gross per 24 hour  Intake   2490 ml  Output    600 ml  Net   1890 ml    General: Alert, awake, oriented x3, in no acute distress.  HEENT: No bruits, no goiter.  Heart: Regular rate and rhythm, without murmurs, rubs, gallops.  Lungs:  bilateral air movement, CTA.  Abdomen: Soft, nontender, nondistended, positive bowel sounds.  Neuro: Grossly intact, nonfocal. Extremities: no edema. Extremities: Right foot with dressing less bleeding    Lab Results:  Basename 12/20/10 0514 12/19/10 0500 12/18/10 0420  NA 135 131* --  K 3.6 3.4* --  CL 102 99 --  CO2 26 25 --  GLUCOSE 146* 176* --  BUN 6 4* --  CREATININE 0.62 0.49* --  CALCIUM 8.8 8.6 --  MG -- -- 1.8  PHOS -- -- --    Basename 12/20/10 0514 12/19/10 0500  AST 172* 163*  ALT 79* 63*  ALKPHOS 93 95  BILITOT 2.1* 2.0*  PROT 8.1 8.6*  ALBUMIN 3.0* 3.1*   No results found for this basename: LIPASE:2,AMYLASE:2 in the last 72 hours  Basename 12/20/10 0514 12/19/10 0500  WBC 4.1 3.8*  NEUTROABS -- --  HGB 14.0 14.6  HCT 41.1 42.3  MCV 98.1 97.2  PLT 66* 56*   Micro Results: Recent Results (from the past 240 hour(s))  CULTURE, BLOOD (ROUTINE X 2)     Status: Normal (Preliminary result)   Collection Time   12/17/10 10:23 AM      Component Value Range Status Comment   Specimen Description BLOOD RIGHT FOREARM   Final    Special Requests BOTTLES DRAWN AEROBIC AND ANAEROBIC 5 CC EACH   Final    Setup Time [id number]   Final    Culture     Final    Value:        BLOOD CULTURE RECEIVED NO GROWTH TO DATE CULTURE WILL BE HELD FOR 5 DAYS BEFORE ISSUING A FINAL NEGATIVE REPORT   Report Status PENDING   Incomplete   CULTURE, BLOOD (ROUTINE X 2)     Status: Normal (Preliminary result)   Collection Time   12/17/10 11:00 AM      Component Value Range Status Comment   Specimen Description BLOOD   Final    Special Requests BOTTLES DRAWN AEROBIC AND ANAEROBIC 3 CC EACH   Final    Setup Time [id number]   Final    Culture     Final    Value:        BLOOD CULTURE RECEIVED NO GROWTH TO DATE CULTURE WILL BE HELD FOR 5 DAYS BEFORE ISSUING A FINAL NEGATIVE REPORT   Report Status PENDING   Incomplete   WOUND CULTURE     Status: Normal (Preliminary result)   Collection Time   12/17/10  3:16 PM      Component Value Range Status Comment  Specimen Description FOOT   Final    Special Requests Normal   Final    Gram Stain     Final    Value: NO WBC SEEN     NO SQUAMOUS EPITHELIAL CELLS SEEN     NO ORGANISMS SEEN   Culture     Final    Value: ABUNDANT STAPHYLOCOCCUS AUREUS     Note: RIFAMPIN AND GENTAMICIN SHOULD NOT BE USED AS SINGLE DRUGS FOR TREATMENT OF STAPH INFECTIONS.   Report Status PENDING   Incomplete   MRSA PCR SCREENING     Status: Normal   Collection Time   12/17/10  3:16 PM      Component Value Range Status Comment   MRSA by PCR NEGATIVE  NEGATIVE  Final     Studies/Results: US Abdomen Port  12/20/2010  *RADIOLOGY REPORT*  Clinical Data:  Abdominal pain  COMPLETE ABDOMINAL ULTRASOUND  Comparison:  None.  Findings:  Gallbladder:  No shadowing gallstones or echogenic sludge.  No gallbladder wall thickening or pericholecystic fluid.  Negative sonographic Murphy's sign according to the ultrasound technologist.  Common bile duct:  5.7 mm diameter, unremarkable  Liver:  No focal lesion identified.  Within normal limits in parenchymal echogenicity.  IVC:  Appears normal.  Pancreas:  No focal abnormality seen.  Spleen:   10.3 cm craniocaudal length, unremarkable.  Right Kidney:  13.2 cm. No hydronephrosis.  Well-preserved cortex. Normal size and parenchymal echotexture without focal abnormalities.  Left Kidney:  13.5 cm. No hydronephrosis.  Well-preserved cortex. Normal size and parenchymal echotexture without focal abnormalities.  Abdominal aorta:  No aneurysm identified.  IMPRESSION: Negative abdominal ultrasound.  Original Report Authenticated By: Osa Craver, M.D.    Medications: I have reviewed the patient's current medications. 1Right foot cellulitis: Chronic changes of osteomyelitis right foot.  Continue with Vancomycin and zosyn day 4, waiting culture sensitivity, wound showed some staph.  Appreciate ortho help. No surgery at this time. Patient will need to follow up with Dr Montez Morita in 1 week.  I will discharge patient on Ciprofloxacin and bactrim probably.  2Alcohol Abuse:  CIWA protocol. . Mild increase bilirubin and AST.  Thiamine and Folate.  3DVT Prophylaxis  SCD, No Lovenox due to thrombocytopenia.  4Thrombocytopenia: Likely secondary to alcohol use. Monitor. Stable.   5Nausea and vomiting : Resolved.  ? Due to alcoholic gastritis  Protonix ordered an anti-emetics  6Generalized torso bruising  After a fall.  the KUB and chest x-ray negative.  7Chest Xray atelectasis Vs PNA:  Already on antibiotics , favor atelectasis.  8-Hypokalemia:  kcl daily.  Resolved. 9-Transaminases: Likely secondary to alcohol, mild increase, Repeat in am. . INR 1.4. [place] negative, Check Viral hepatitis. Probably alcoholic hepatitis.  Hyponatremia : probably secondary to potomania. Resolved.       LOS: 4 days   Indie Boehne M.D.  [facility] 12/20/2010, 2:31 PM

## 2010-12-20 NOTE — Progress Notes (Signed)
ANTIBIOTIC CONSULT NOTE - INITIAL  Pharmacy Consult for Vancomycin Indication: right foot cellulitis with chronic changes of osteomyletitis  No Known Allergies  Patient Measurements: Height: 5' 4.17" (163 cm) Weight: 150 lb 8 oz (68.266 kg) IBW/kg (Calculated) : 59.6   Vital Signs: Temp: 98.2 F (36.8 C) (11/25 0619) Temp src: Oral (11/25 0619) BP: 106/69 mmHg (11/25 0619) Pulse Rate: 73  (11/25 0619) Intake/Output from previous day: 11/24 0701 - 11/25 0700 In: 2850 [P.O.:1200; IV Piggyback:1650] Out: 600 [Urine:600] Intake/Output from this shift:    Labs:  Basename 12/20/10 0514 12/19/10 0500 12/18/10 0420  WBC 4.1 3.8* 3.9*  HGB 14.0 14.6 14.2  PLT 66* 56* 64*  LABCREA -- -- --  CREATININE 0.62 0.49* 0.44*   Estimated Creatinine Clearance: 107.6 ml/min (by C-G formula based on Cr of 0.62).  Basename 12/20/10 1201 12/19/10 1121  VANCOTROUGH 22.0* 8.7*  VANCOPEAK -- --  Drue Dun -- --  GENTTROUGH -- --  GENTPEAK -- --  GENTRANDOM -- --  TOBRATROUGH -- --  TOBRAPEAK -- --  TOBRARND -- --  AMIKACINPEAK -- --  AMIKACINTROU -- --  AMIKACIN -- --     Microbiology: Recent Results (from the past 720 hour(s))  CULTURE, BLOOD (ROUTINE X 2)     Status: Normal (Preliminary result)   Collection Time   12/17/10 10:23 AM      Component Value Range Status Comment   Specimen Description BLOOD RIGHT FOREARM   Final    Special Requests BOTTLES DRAWN AEROBIC AND ANAEROBIC 5 CC EACH   Final    Setup Time [id number]   Final    Culture     Final    Value:        BLOOD CULTURE RECEIVED NO GROWTH TO DATE CULTURE WILL BE HELD FOR 5 DAYS BEFORE ISSUING A FINAL NEGATIVE REPORT   Report Status PENDING   Incomplete   CULTURE, BLOOD (ROUTINE X 2)     Status: Normal (Preliminary result)   Collection Time   12/17/10 11:00 AM      Component Value Range Status Comment   Specimen Description BLOOD   Final    Special Requests BOTTLES DRAWN AEROBIC AND ANAEROBIC 3 CC EACH   Final     Setup Time [id number]   Final    Culture     Final    Value:        BLOOD CULTURE RECEIVED NO GROWTH TO DATE CULTURE WILL BE HELD FOR 5 DAYS BEFORE ISSUING A FINAL NEGATIVE REPORT   Report Status PENDING   Incomplete   WOUND CULTURE     Status: Normal (Preliminary result)   Collection Time   12/17/10  3:16 PM      Component Value Range Status Comment   Specimen Description FOOT   Final    Special Requests Normal   Final    Gram Stain     Final    Value: NO WBC SEEN     NO SQUAMOUS EPITHELIAL CELLS SEEN     NO ORGANISMS SEEN   Culture     Final    Value: ABUNDANT STAPHYLOCOCCUS AUREUS     Note: RIFAMPIN AND GENTAMICIN SHOULD NOT BE USED AS SINGLE DRUGS FOR TREATMENT OF STAPH INFECTIONS.   Report Status PENDING   Incomplete   MRSA PCR SCREENING     Status: Normal   Collection Time   12/17/10  3:16 PM      Component Value Range Status Comment   MRSA by  PCR NEGATIVE  NEGATIVE  Final     Medical History: Past Medical History  Diagnosis Date  . Burn   . GERD (gastroesophageal reflux disease)     Medications:  Scheduled:     . folic acid  1 mg Oral Daily  . LORazepam  0-4 mg Oral Q12H  . magnesium oxide  200 mg Oral Daily  . multivitamins ther. w/minerals  1 tablet Oral Daily  . nicotine  14 mg Transdermal Daily  . pantoprazole  40 mg Oral Q0600  . piperacillin-tazobactam (ZOSYN)  IV  3.375 g Intravenous Q8H  . potassium chloride  40 mEq Oral Daily  . thiamine  100 mg Oral Daily   Or  . thiamine  100 mg Intravenous Daily  . vancomycin  1,750 mg Intravenous Q8H   Assessment:  35YOM on day # 4/x vancomycin and zosyn for right foot cellulitis with chronic changes of osteomyelitis. Pt is afebrile and wbc wnl. SCr bumped slightly to 0.62.  Yesterday's vancomycin trough was 8.7 on vanc 1g IV q8h, so dose had been adjusted to 1750mg  q8h. Trough today resulted as 22 (appears supratherapeutic).  This level was drawn only 6hrs after last dose (instead of an ideal 7 1/2  hours following dose) so will keep current dosing since levels should be falling into therapeutic range at the end of the dosing interval.  Goal of Therapy:  Vancomycin trough level 15-20 mcg/ml and Zosyn adjustments per renal fxn  Plan:  Continue Vancomycin to 1750mg  IV q8h.  Follow up culture results and plan for antibiotics.  Clance Boll 12/20/2010,1:06 PM

## 2010-12-20 NOTE — Plan of Care (Signed)
Problem: Problem: Skin/Wound Progression Goal: OTHER SKIN/WOUND GOAL(S) Outcome: Progressing Dressing changes bid

## 2010-12-21 LAB — COMPREHENSIVE METABOLIC PANEL
Alkaline Phosphatase: 87 U/L (ref 39–117)
BUN: 7 mg/dL (ref 6–23)
CO2: 24 mEq/L (ref 19–32)
Chloride: 104 mEq/L (ref 96–112)
Creatinine, Ser: 0.63 mg/dL (ref 0.50–1.35)
GFR calc non Af Amer: >90 mL/min (ref >90)
Glucose, Bld: 136 mg/dL — ABNORMAL HIGH (ref 70–99)
Potassium: 3.3 mEq/L — ABNORMAL LOW (ref 3.5–5.1)
Total Bilirubin: 1.7 mg/dL — ABNORMAL HIGH (ref 0.3–1.2)

## 2010-12-21 LAB — CBC
MCH: 34.3 pg — ABNORMAL HIGH (ref 26.0–34.0)
MCV: 98.1 fL (ref 78.0–100.0)
Platelets: 71 10*3/uL — ABNORMAL LOW (ref 150–400)
RBC: 4.14 MIL/uL — ABNORMAL LOW (ref 4.22–5.81)
RDW: 13 % (ref 11.5–15.5)
WBC: 4.7 10*3/uL (ref 4.0–10.5)

## 2010-12-21 LAB — WOUND CULTURE: Special Requests: NORMAL

## 2010-12-21 MED ORDER — DOXYCYCLINE HYCLATE 50 MG PO CAPS: 100.0000 mg | ORAL_CAPSULE | Freq: Two times a day (BID) | ORAL | Status: AC

## 2010-12-21 MED ORDER — PANTOPRAZOLE SODIUM 40 MG PO TBEC: 40.0000 mg | DELAYED_RELEASE_TABLET | Freq: Every day | ORAL | Status: DC

## 2010-12-21 MED ORDER — POTASSIUM CHLORIDE CRYS ER 20 MEQ PO TBCR: 40.0000 meq | EXTENDED_RELEASE_TABLET | Freq: Every day | ORAL | Status: DC

## 2010-12-21 MED ORDER — CIPROFLOXACIN HCL 500 MG PO TABS: 500.0000 mg | ORAL_TABLET | Freq: Two times a day (BID) | ORAL | Status: AC

## 2010-12-21 NOTE — Progress Notes (Signed)
Physical Therapy Evaluation Patient Details Name: Mitchell [name] MRN: [id number] DOB: 29-Nov-1975 Today's Date: 12/21/2010 10:56-11:17 Eval 1  Problem List:  Patient Active Problem List  Diagnoses  . Osteomyelitis    Past Medical History:  Past Medical History  Diagnosis Date  . Burn   . GERD (gastroesophageal reflux disease)    Past Surgical History:  Past Surgical History  Procedure Date  . Leg surgery     PT Assessment/Plan/Recommendation PT Assessment Clinical Impression Statement: Pt pleasant and cooperative, had dizziness after getting OOB quickly and walking, resolved after seated rest break. VSS. Pt refused use of cane for ambulation. Plans to DC to sister's home.  PT Recommendation/Assessment: Patent does not need any further PT services No Skilled PT: Patient is modified independent with all activity/mobility;Other (comment) (Pt refused use of cane) PT Goals     PT Evaluation Precautions/Restrictions  Precautions Required Braces or Orthoses: Yes Other Brace/Splint: Darco shoe Restrictions Weight Bearing Restrictions: No Prior Functioning  Home Living Lives With: Spouse Receives Help From: Family (plans to DC to sister's house since wife works days) Home Layout: One level Home Access: Level entry Prior Function Level of Independence: Independent with basic ADLs;Independent with homemaking with ambulation;Independent with gait;Independent with transfers Able to Take Stairs?: Yes 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 Arousal/Alertness: Awake/alert Overall Cognitive Status: Appears within functional limits for tasks assessed Orientation Level: Oriented X4 Sensation/Coordination   Extremity Assessment RLE Assessment RLE Assessment: Within Functional Limits (ankle NT) LLE Assessment LLE Assessment: Within Functional Limits Mobility (including Balance) Transfers Transfers: Yes Sit to Stand: 6: Modified independent  (Device/Increase time);With upper extremity assist;From bed Stand to Sit: 6: Modified independent (Device/Increase time);With armrests;With upper extremity assist;To chair/3-in-1 Ambulation/Gait Ambulation/Gait: Yes Ambulation/Gait Assistance: 5: Supervision (with Darco shoe, Supervision for dizziness, & mild unsteadin) Ambulation Distance (Feet): 150 Feet Assistive device: Other (Comment) (Darco shoe) Gait Pattern: Decreased step length - left;Decreased weight shift to right Gait velocity: increased, pt somewhat impulsive, ? due to EtOH Stairs: No    Exercise    End of Session PT - End of Session Equipment Utilized During Treatment: Gait belt Activity Tolerance: Other (comment) (Pt reported dizziness after walking 25', sat for 2 min) Patient left: in chair;with call bell in reach Nurse Communication: Mobility status for ambulation General Behavior During Session: Other (comment) (Somewhat impulsive) Cognition: WFL for tasks performed Tamala Ser PT 12/21/2010  [phone]   Tamala Ser 12/21/2010, 11:36 AM

## 2010-12-21 NOTE — Progress Notes (Signed)
Occupational Therapy Evaluation Patient Details Name: Mitchell [name] MRN: [id number] DOB: 1975/11/19 Today's Date: 12/21/2010 EV2  [id number] Problem List:  Patient Active Problem List  Diagnoses  . Osteomyelitis    Past Medical History:  Past Medical History  Diagnosis Date  . Burn   . GERD (gastroesophageal reflux disease)    Past Surgical History:  Past Surgical History  Procedure Date  . Leg surgery     OT Assessment/Plan/Recommendation OT Assessment Clinical Impression Statement: Pt s/p admit for osteomyelitis and cellulitis of R foot who is able to complete all adls with supervision.  Pt's sister will be with pt at d/c.  No further acute or post acute OT needs. OT Recommendation/Assessment: Patient does not need any further OT services OT Goals    OT Evaluation Precautions/Restrictions  Precautions Required Braces or Orthoses: Yes Other Brace/Splint: Darco shoe Restrictions Weight Bearing Restrictions: No Prior Functioning Home Living Lives With: Spouse Receives Help From: Family Type of Home: House Home Layout: One level Home Access: Level entry Firefighter: Standard Home Adaptive Equipment: None Prior Function Level of Independence: Independent with basic ADLs;Independent with homemaking with ambulation;Independent with gait;Independent with transfers Able to Take Stairs?: Yes Driving: No Vocation: Part time employment Vocation Requirements: washes dishes at [employer] ADL ADL Eating/Feeding: Simulated;Set up;Other (comment) (Pt with notable tremor in Bilateral UEs) Where Assessed - Eating/Feeding: Chair Grooming: Performed;Teeth care;Supervision/safety Grooming Details (indicate cue type and reason): supervision.  Pt impulsive at times but never lost balance Where Assessed - Grooming: Standing at sink Upper Body Bathing: Simulated;Set up Where Assessed - Upper Body Bathing: Sitting, chair Lower Body Bathing: Simulated;Set up Where Assessed - Lower  Body Bathing: Sit to stand from chair Upper Body Dressing: Performed;Set up Upper Body Dressing Details (indicate cue type and reason): donned gown without assist Where Assessed - Upper Body Dressing: Sitting, bed Lower Body Dressing: Performed;Set up Lower Body Dressing Details (indicate cue type and reason): donned boot and shoe. Where Assessed - Lower Body Dressing: Sit to stand from bed Toilet Transfer: Simulated;Supervision/safety (Pt tends to be impulsive) Toilet Transfer Method: Stand pivot Toilet Transfer Equipment: Regular height toilet;Grab bars Toileting - Clothing Manipulation: Simulated;Supervision/safety Where Assessed - Toileting Clothing Manipulation: Standing Toileting - Hygiene: Supervision/safety Where Assessed - Toileting Hygiene: Sit to stand from 3-in-1 or toilet Tub/Shower Transfer: Not assessed ADL Comments: Pt overall does not need physical assist to do ADLS.  Pt is impulsive with all movements and most likely was PTA.  Pt to d/c home with sister and will have necessary assist. Vision/Perception    Cognition Cognition Arousal/Alertness: Awake/alert Overall Cognitive Status: Appears within functional limits for tasks assessed Orientation Level: Oriented X4 Sensation/Coordination Sensation Light Touch: Appears Intact Coordination Gross Motor Movements are Fluid and Coordinated: Yes Fine Motor Movements are Fluid and Coordinated: No Finger Nose Finger Test: Pt with tremor with all movements with BUES. Extremity Assessment RUE Assessment RUE Assessment: Within Functional Limits LUE Assessment LUE Assessment: Within Functional Limits Mobility  Bed Mobility Bed Mobility: Yes Supine to Sit: 5: Supervision Transfers Transfers: Yes Sit to Stand: 6: Modified independent (Device/Increase time);With upper extremity assist;From bed Stand to Sit: 6: Modified independent (Device/Increase time);With armrests;With upper extremity assist;To chair/3-in-1 Exercises     End of Session OT - End of Session Equipment Utilized During Treatment: Gait belt Activity Tolerance: Patient tolerated treatment well Patient left: in chair;with call bell in reach Nurse Communication: Mobility status for transfers General Behavior During Session: [facility] for tasks performed Cognition: [facility] for tasks performed  Hope Budds  [phone] 12/21/2010, 12:02 PM

## 2010-12-21 NOTE — Progress Notes (Signed)
TALKED TO PATIENT VIA SPANISH INTERPRETER JULIE ABOUT FOLLOW UP MEDICAL CARE; PATIENT IS AGREEABLE TO GO TO [facility]ALTH [facility] - ELIGIBILITY APT IS Feb 04, 2011 AT 11:30 AND APT WITH DR Sherron Flemings FOR Feb 08, 2011 AT 2:45PM; PATIENT GOES TO [facility] TO GET HIS PRESCRIPTIONS FILLED; FINANCIAL COUNSELOR CALLED TO TALK TO PATIENT; B. Anistyn Graddy RN, BSN, MHA
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
## 2010-12-23 LAB — CULTURE, BLOOD (ROUTINE X 2)
Culture  Setup Time: 201211222236
Culture: NO GROWTH

## 2010-12-23 MED ORDER — GADOBENATE DIMEGLUMINE 529 MG/ML IV SOLN
15.0000 mL | Freq: Once | INTRAVENOUS | Status: AC | PRN
  Administered 2010-12-23: 15 mL via INTRAVENOUS

## 2011-02-01 ENCOUNTER — Encounter: Payer: Self-pay | Admitting: Family Medicine

## 2011-02-01 ENCOUNTER — Ambulatory Visit (INDEPENDENT_AMBULATORY_CARE_PROVIDER_SITE_OTHER): Payer: Self-pay | Admitting: Family Medicine

## 2011-02-01 DIAGNOSIS — M86679 Other chronic osteomyelitis, unspecified ankle and foot: Secondary | ICD-10-CM

## 2011-02-01 DIAGNOSIS — M869 Osteomyelitis, unspecified: Secondary | ICD-10-CM

## 2011-02-01 DIAGNOSIS — L97509 Non-pressure chronic ulcer of other part of unspecified foot with unspecified severity: Secondary | ICD-10-CM

## 2011-02-01 DIAGNOSIS — L03115 Cellulitis of right lower limb: Secondary | ICD-10-CM

## 2011-02-01 MED ORDER — CIPROFLOXACIN HCL 500 MG PO TABS: 500.0000 mg | ORAL_TABLET | Freq: Two times a day (BID) | ORAL | Status: AC

## 2011-02-01 MED ORDER — DOXYCYCLINE HYCLATE 100 MG PO TABS: 100.0000 mg | ORAL_TABLET | Freq: Two times a day (BID) | ORAL | Status: DC

## 2011-02-01 NOTE — Progress Notes (Signed)
Due to language barrier, an interpreter was present during the history-taking and subsequent discussion (and for part of the physical exam) with this patient. Interpreter Wyvonnia Dusky for [employer] 15.30Due to language barrier, an interpreter was present during the history-taking and subsequent discussion (and for part of the physical exam) with this patient. Interpreter Wyvonnia Dusky 02/01/2011    02/01/2011

## 2011-02-01 NOTE — Patient Instructions (Signed)
You were seen by Dr. Louanne Belton today. Once you have seen Rudell Cobb and have her card, call back and tell our staff that you are ready for a referral to the wound care clinic.

## 2011-02-02 ENCOUNTER — Encounter: Payer: Self-pay | Admitting: Family Medicine

## 2011-02-02 NOTE — Assessment & Plan Note (Signed)
I will treat with an extended course of doxycycline and Cipro as these appear to have worked in the past. The patient was instructed to visit Rudell Cobb to see if we can get him qualified for financial assistance. At that point in time he is instructed to call our clinic and we will arrange for a wound care referral. While the patient has been seen by a surgeon in the hospital and, at that time, was not recommended for surgery, I would've a low threshold for another referral as I think the patient's chances for avoiding surgery in case of chronic osteomyelitis are small at best. At this point in time the patient is not demonstrating any systemic signs that would make me concerned about a minute loss of the limb or need for hospitalization for IV antibiotics. Patient was instructed on red flags that would prompt return.
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
## 2011-02-03 ENCOUNTER — Other Ambulatory Visit: Payer: Self-pay | Admitting: Family Medicine

## 2011-02-03 ENCOUNTER — Telehealth: Payer: Self-pay | Admitting: *Deleted

## 2011-02-03 ENCOUNTER — Other Ambulatory Visit: Payer: Self-pay | Admitting: *Deleted

## 2011-02-03 MED ORDER — DOXYCYCLINE MONOHYDRATE 100 MG PO TABS: 100.0000 mg | ORAL_TABLET | Freq: Two times a day (BID) | ORAL | Status: AC

## 2011-02-03 MED ORDER — DOXYCYCLINE MONOHYDRATE 150 MG PO TABS: 150.0000 mg | ORAL_TABLET | Freq: Two times a day (BID) | ORAL | Status: DC

## 2011-02-03 NOTE — Telephone Encounter (Signed)
Mitchell [name], . She informed Rondall Allegra that she did pick up antibiotics(2) last night. So patient has both antibiotics and taking them. I had Miranes call patient and spoke with spouse and informed her that another medication will be cal

## 2011-02-06 ENCOUNTER — Encounter (HOSPITAL_COMMUNITY): Payer: Self-pay | Admitting: Adult Health

## 2011-02-06 ENCOUNTER — Other Ambulatory Visit: Payer: Self-pay

## 2011-02-06 ENCOUNTER — Inpatient Hospital Stay (HOSPITAL_COMMUNITY)
Admission: EM | Admit: 2011-02-06 | Discharge: 2011-02-09 | DRG: 918 | Payer: Medicaid Other | Attending: Internal Medicine | Admitting: Internal Medicine

## 2011-02-06 ENCOUNTER — Emergency Department (HOSPITAL_COMMUNITY): Payer: Medicaid Other

## 2011-02-06 DIAGNOSIS — T3791XA Poisoning by unspecified systemic anti-infective and antiparasitics, accidental (unintentional), initial encounter: Secondary | ICD-10-CM | POA: Diagnosis present

## 2011-02-06 DIAGNOSIS — T1491XA Suicide attempt, initial encounter: Secondary | ICD-10-CM

## 2011-02-06 DIAGNOSIS — F329 Major depressive disorder, single episode, unspecified: Secondary | ICD-10-CM | POA: Diagnosis present

## 2011-02-06 DIAGNOSIS — T394X2A Poisoning by antirheumatics, not elsewhere classified, intentional self-harm, initial encounter: Secondary | ICD-10-CM | POA: Diagnosis present

## 2011-02-06 DIAGNOSIS — T50901A Poisoning by unspecified drugs, medicaments and biological substances, accidental (unintentional), initial encounter: Secondary | ICD-10-CM

## 2011-02-06 DIAGNOSIS — T5191XA Toxic effect of unspecified alcohol, accidental (unintentional), initial encounter: Principal | ICD-10-CM | POA: Diagnosis present

## 2011-02-06 DIAGNOSIS — M86679 Other chronic osteomyelitis, unspecified ankle and foot: Secondary | ICD-10-CM | POA: Diagnosis present

## 2011-02-06 DIAGNOSIS — T300 Burn of unspecified body region, unspecified degree: Secondary | ICD-10-CM | POA: Diagnosis present

## 2011-02-06 DIAGNOSIS — F3289 Other specified depressive episodes: Secondary | ICD-10-CM | POA: Diagnosis present

## 2011-02-06 DIAGNOSIS — D696 Thrombocytopenia, unspecified: Secondary | ICD-10-CM | POA: Diagnosis present

## 2011-02-06 DIAGNOSIS — K2920 Alcoholic gastritis without bleeding: Secondary | ICD-10-CM | POA: Diagnosis present

## 2011-02-06 DIAGNOSIS — F1010 Alcohol abuse, uncomplicated: Secondary | ICD-10-CM | POA: Diagnosis present

## 2011-02-06 DIAGNOSIS — T398X2A Poisoning by other nonopioid analgesics and antipyretics, not elsewhere classified, intentional self-harm, initial encounter: Secondary | ICD-10-CM | POA: Diagnosis present

## 2011-02-06 DIAGNOSIS — M869 Osteomyelitis, unspecified: Secondary | ICD-10-CM

## 2011-02-06 DIAGNOSIS — L97509 Non-pressure chronic ulcer of other part of unspecified foot with unspecified severity: Secondary | ICD-10-CM | POA: Diagnosis present

## 2011-02-06 DIAGNOSIS — T50992A Poisoning by other drugs, medicaments and biological substances, intentional self-harm, initial encounter: Secondary | ICD-10-CM | POA: Diagnosis present

## 2011-02-06 DIAGNOSIS — K7010 Alcoholic hepatitis without ascites: Secondary | ICD-10-CM | POA: Diagnosis present

## 2011-02-06 DIAGNOSIS — T39094A Poisoning by salicylates, undetermined, initial encounter: Secondary | ICD-10-CM | POA: Diagnosis present

## 2011-02-06 DIAGNOSIS — K219 Gastro-esophageal reflux disease without esophagitis: Secondary | ICD-10-CM | POA: Diagnosis present

## 2011-02-06 DIAGNOSIS — T6592XA Toxic effect of unspecified substance, intentional self-harm, initial encounter: Secondary | ICD-10-CM | POA: Diagnosis present

## 2011-02-06 DIAGNOSIS — R45851 Suicidal ideations: Secondary | ICD-10-CM

## 2011-02-06 DIAGNOSIS — E876 Hypokalemia: Secondary | ICD-10-CM | POA: Diagnosis present

## 2011-02-06 DIAGNOSIS — T364X1A Poisoning by tetracyclines, accidental (unintentional), initial encounter: Secondary | ICD-10-CM | POA: Diagnosis present

## 2011-02-06 HISTORY — DX: Suicide attempt, initial encounter: T14.91XA

## 2011-02-06 HISTORY — DX: Alcohol abuse, uncomplicated: F10.10

## 2011-02-06 HISTORY — DX: Alcoholic hepatitis without ascites: K70.10

## 2011-02-06 HISTORY — DX: Osteomyelitis, unspecified: M86.9

## 2011-02-06 LAB — SALICYLATE LEVEL: Salicylate Lvl: <2 mg/dL — ABNORMAL LOW (ref 2.8–20.0)

## 2011-02-06 LAB — COMPREHENSIVE METABOLIC PANEL
ALT: 42 U/L (ref 0–53)
AST: 88 U/L — ABNORMAL HIGH (ref 0–37)
CO2: 25 mEq/L (ref 19–32)
Chloride: 101 mEq/L (ref 96–112)
GFR calc non Af Amer: >90 mL/min (ref >90)
Potassium: 2.9 mEq/L — ABNORMAL LOW (ref 3.5–5.1)
Sodium: 141 mEq/L (ref 135–145)
Total Bilirubin: 1.2 mg/dL (ref 0.3–1.2)

## 2011-02-06 LAB — CARDIAC PANEL(CRET KIN+CKTOT+MB+TROPI)
CK, MB: 2.8 ng/mL (ref 0.3–4.0)
Relative Index: 2.7 — ABNORMAL HIGH (ref 0.0–2.5)
Total CK: 104 U/L (ref 7–232)
Troponin I: <0.3 ng/mL (ref <0.30)

## 2011-02-06 LAB — RAPID URINE DRUG SCREEN, HOSP PERFORMED
Amphetamines: NOT DETECTED
Barbiturates: NOT DETECTED
Benzodiazepines: NOT DETECTED
Tetrahydrocannabinol: NOT DETECTED

## 2011-02-06 LAB — DIFFERENTIAL
Basophils Relative: 1 % (ref 0–1)
Eosinophils Absolute: 0.1 10*3/uL (ref 0.0–0.7)
Monocytes Absolute: 0.3 10*3/uL (ref 0.1–1.0)
Neutrophils Relative %: 52 % (ref 43–77)

## 2011-02-06 LAB — CBC
Hemoglobin: 16.1 g/dL (ref 13.0–17.0)
MCH: 33.5 pg (ref 26.0–34.0)
MCHC: 37.1 g/dL — ABNORMAL HIGH (ref 30.0–36.0)

## 2011-02-06 LAB — PROTIME-INR: Prothrombin Time: 17.7 seconds — ABNORMAL HIGH (ref 11.6–15.2)

## 2011-02-06 LAB — ETHANOL: Alcohol, Ethyl (B): 366 mg/dL — ABNORMAL HIGH (ref 0–11)

## 2011-02-06 MED ORDER — PANTOPRAZOLE SODIUM 40 MG PO TBEC
40.0000 mg | DELAYED_RELEASE_TABLET | Freq: Every day | ORAL | Status: DC
  Administered 2011-02-07 – 2011-02-09 (×3): 40 mg via ORAL
  Filled 2011-02-06 (×4): qty 1

## 2011-02-06 MED ORDER — SODIUM CHLORIDE 0.9 % IV SOLN
Freq: Once | INTRAVENOUS | Status: AC
  Administered 2011-02-06: 16:00:00 via INTRAVENOUS

## 2011-02-06 MED ORDER — THIAMINE HCL 100 MG/ML IJ SOLN
100.0000 mg | Freq: Every day | INTRAMUSCULAR | Status: DC
  Filled 2011-02-06: qty 1

## 2011-02-06 MED ORDER — GUAIFENESIN-DM 100-10 MG/5ML PO SYRP: 5.0000 mL | ORAL_SOLUTION | ORAL | Status: DC | PRN

## 2011-02-06 MED ORDER — LORAZEPAM 1 MG PO TABS: 1.0000 mg | ORAL_TABLET | Freq: Four times a day (QID) | ORAL | Status: DC | PRN

## 2011-02-06 MED ORDER — SODIUM CHLORIDE 0.9 % IV SOLN
Freq: Once | INTRAVENOUS | Status: AC
  Administered 2011-02-06: 17:00:00 via INTRAVENOUS

## 2011-02-06 MED ORDER — CHARCOAL ACTIVATED PO LIQD
ORAL | Status: AC
  Administered 2011-02-06: 100 g via ORAL
  Filled 2011-02-06: qty 240

## 2011-02-06 MED ORDER — CHARCOAL ACTIVATED PO LIQD
100.0000 g | Freq: Once | ORAL | Status: AC
  Administered 2011-02-06: 100 g via ORAL
  Filled 2011-02-06: qty 240

## 2011-02-06 MED ORDER — LORAZEPAM 2 MG/ML IJ SOLN: 1.0000 mg | Freq: Four times a day (QID) | INTRAMUSCULAR | Status: DC | PRN

## 2011-02-06 MED ORDER — ONDANSETRON HCL 4 MG/2ML IJ SOLN
4.0000 mg | Freq: Once | INTRAMUSCULAR | Status: AC
  Administered 2011-02-06: 4 mg via INTRAVENOUS

## 2011-02-06 MED ORDER — ONDANSETRON HCL 4 MG PO TABS: 4.0000 mg | ORAL_TABLET | Freq: Four times a day (QID) | ORAL | Status: DC | PRN

## 2011-02-06 MED ORDER — FOLIC ACID 1 MG PO TABS
1.0000 mg | ORAL_TABLET | Freq: Every day | ORAL | Status: DC
  Administered 2011-02-07 – 2011-02-09 (×3): 1 mg via ORAL
  Filled 2011-02-06 (×3): qty 1

## 2011-02-06 MED ORDER — POTASSIUM CHLORIDE 10 MEQ/100ML IV SOLN
10.0000 meq | INTRAVENOUS | Status: AC
  Administered 2011-02-06 (×4): 10 meq via INTRAVENOUS
  Filled 2011-02-06 (×4): qty 100

## 2011-02-06 MED ORDER — ONDANSETRON HCL 4 MG/2ML IJ SOLN: 4.0000 mg | Freq: Four times a day (QID) | INTRAMUSCULAR | Status: DC | PRN

## 2011-02-06 MED ORDER — ADULT MULTIVITAMIN W/MINERALS CH
1.0000 | ORAL_TABLET | Freq: Every day | ORAL | Status: DC
  Administered 2011-02-07 – 2011-02-09 (×3): 1 via ORAL
  Filled 2011-02-06 (×3): qty 1

## 2011-02-06 MED ORDER — ALBUTEROL SULFATE (5 MG/ML) 0.5% IN NEBU: 2.5000 mg | INHALATION_SOLUTION | RESPIRATORY_TRACT | Status: DC | PRN

## 2011-02-06 MED ORDER — VITAMIN B-1 100 MG PO TABS
100.0000 mg | ORAL_TABLET | Freq: Every day | ORAL | Status: DC
  Administered 2011-02-07: 100 mg via ORAL
  Filled 2011-02-06: qty 1

## 2011-02-06 MED ORDER — POTASSIUM CHLORIDE IN NACL 20-0.9 MEQ/L-% IV SOLN
INTRAVENOUS | Status: AC
  Administered 2011-02-07 – 2011-02-08 (×4): via INTRAVENOUS
  Filled 2011-02-06 (×3): qty 1000
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
## 2011-02-06 NOTE — ED Notes (Signed)
Pt. And belongings have both been wanded.
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
## 2011-02-06 NOTE — ED Notes (Addendum)
Overdose on cipro, doxy, asa, pt is alert, oriented intentionally tried to OD. Pt wished to harm self. ETOH on borad and vomitting

## 2011-02-06 NOTE — ED Notes (Signed)
Pt. Inventory: 1 pair of jeans, 1 pair of underwear, 1 t-shirt, 1 belt, 1 pair of socks. Medications found on patient: 1 bottle labeled aspirin (17 pills), 1 bottle of Ciprofloxacin, 1 bottle of Doxy-Cycl, 2 capsules of penicillin, $11.50 in cash, and 1 screw. 1 bag of belongings located behind nurses station.

## 2011-02-07 DIAGNOSIS — T39094A Poisoning by salicylates, undetermined, initial encounter: Secondary | ICD-10-CM

## 2011-02-07 DIAGNOSIS — T3791XA Poisoning by unspecified systemic anti-infective and antiparasitics, accidental (unintentional), initial encounter: Secondary | ICD-10-CM

## 2011-02-07 DIAGNOSIS — T5191XA Toxic effect of unspecified alcohol, accidental (unintentional), initial encounter: Principal | ICD-10-CM

## 2011-02-07 DIAGNOSIS — T50992A Poisoning by other drugs, medicaments and biological substances, intentional self-harm, initial encounter: Secondary | ICD-10-CM

## 2011-02-07 DIAGNOSIS — T364X1A Poisoning by tetracyclines, accidental (unintentional), initial encounter: Secondary | ICD-10-CM

## 2011-02-07 DIAGNOSIS — T394X2A Poisoning by antirheumatics, not elsewhere classified, intentional self-harm, initial encounter: Secondary | ICD-10-CM

## 2011-02-07 DIAGNOSIS — T6592XA Toxic effect of unspecified substance, intentional self-harm, initial encounter: Secondary | ICD-10-CM

## 2011-02-07 LAB — HEPATIC FUNCTION PANEL
AST: 65 U/L — ABNORMAL HIGH (ref 0–37)
Albumin: 3 g/dL — ABNORMAL LOW (ref 3.5–5.2)
Total Bilirubin: 1 mg/dL (ref 0.3–1.2)

## 2011-02-07 LAB — BASIC METABOLIC PANEL
BUN: <3 mg/dL — ABNORMAL LOW (ref 6–23)
CO2: 26 mEq/L (ref 19–32)
Calcium: 7.6 mg/dL — ABNORMAL LOW (ref 8.4–10.5)
Creatinine, Ser: 0.51 mg/dL (ref 0.50–1.35)
GFR calc non Af Amer: >90 mL/min (ref >90)
Glucose, Bld: 157 mg/dL — ABNORMAL HIGH (ref 70–99)

## 2011-02-07 LAB — CBC
MCH: 33 pg (ref 26.0–34.0)
MCHC: 36.1 g/dL — ABNORMAL HIGH (ref 30.0–36.0)
MCV: 91.3 fL (ref 78.0–100.0)
Platelets: 39 10*3/uL — ABNORMAL LOW (ref 150–400)

## 2011-02-07 LAB — CARDIAC PANEL(CRET KIN+CKTOT+MB+TROPI)
CK, MB: 2.6 ng/mL (ref 0.3–4.0)
Relative Index: INVALID (ref 0.0–2.5)
Total CK: 100 U/L (ref 7–232)
Total CK: 97 U/L (ref 7–232)

## 2011-02-07 LAB — MAGNESIUM: Magnesium: 1.5 mg/dL (ref 1.5–2.5)

## 2011-02-07 MED ORDER — POTASSIUM CHLORIDE CRYS ER 20 MEQ PO TBCR
20.0000 meq | EXTENDED_RELEASE_TABLET | Freq: Two times a day (BID) | ORAL | Status: DC
  Administered 2011-02-07 – 2011-02-09 (×5): 20 meq via ORAL
  Filled 2011-02-07 (×6): qty 1

## 2011-02-07 MED ORDER — THIAMINE HCL 100 MG/ML IJ SOLN
Freq: Once | INTRAVENOUS | Status: AC
  Administered 2011-02-07: 15:00:00 via INTRAVENOUS
  Filled 2011-02-07: qty 1000

## 2011-02-07 MED ORDER — LORAZEPAM 2 MG/ML IJ SOLN
1.0000 mg | INTRAMUSCULAR | Status: DC | PRN
  Administered 2011-02-07: 1 mg via INTRAVENOUS
  Filled 2011-02-07: qty 1
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
## 2011-02-08 LAB — COMPREHENSIVE METABOLIC PANEL
ALT: 36 U/L (ref 0–53)
AST: 70 U/L — ABNORMAL HIGH (ref 0–37)
Albumin: 3.2 g/dL — ABNORMAL LOW (ref 3.5–5.2)
CO2: 28 mEq/L (ref 19–32)
Calcium: 8.8 mg/dL (ref 8.4–10.5)
Sodium: 135 mEq/L (ref 135–145)
Total Protein: 8.7 g/dL — ABNORMAL HIGH (ref 6.0–8.3)
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
## 2011-02-08 NOTE — Consults (Signed)
WOC consult Note Reason for Consult: Foot wounds (Chronic).  Has been treated by Dr. Montez Morita for osteo (Ortho). Wound type:two wounds present on plantar aspect of foot.   Pressure Ulcer POA: /No Measurement: Metatarsal head of right great toe: 1cm x 1cm x .2cm.  Metatarsal head of third digit:.5cm round intact blood-filled blister. Wound bed: Of Right great toe wound: clean, pink. Drainage (amount, consistency, odor) small amount serosanguinous. None from blister. Periwound:intact. Dressing procedure/placement/frequency: Will suggest twice daily NS continually moist. While the cause of this admission is unrelated, you may wish to consult Ortho (Dr. Montez Morita) to let him know of patient's admission. I will not follow.  Please re-consult if needed. Thanks, Ladona Mow, MSN, RN, [facility], CWOCN [phone])
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
## 2011-02-08 NOTE — Progress Notes (Signed)
Met with Pt, psych MD and interpreter.  Pt amenable to [facility] tx but is concerned about the care of his children.  Pt to attempt to find care for his children and will discuss this with CSW tomorrow.  CSW to continue to follow.  Providence Crosby, LCSWA Clinical Social Work [phone]

## 2011-02-09 ENCOUNTER — Inpatient Hospital Stay (HOSPITAL_COMMUNITY)
Admission: EM | Admit: 2011-02-09 | Discharge: 2011-02-13 | DRG: 882 | Disposition: A | Discharge status: Home / Self Care | Payer: Federal, State, Local not specified - Other | Source: Intra-hospital | Attending: Psychiatry | Admitting: Psychiatry

## 2011-02-09 DIAGNOSIS — T368X1A Poisoning by other systemic antibiotics, accidental (unintentional), initial encounter: Secondary | ICD-10-CM

## 2011-02-09 DIAGNOSIS — M86679 Other chronic osteomyelitis, unspecified ankle and foot: Secondary | ICD-10-CM

## 2011-02-09 DIAGNOSIS — F4325 Adjustment disorder with mixed disturbance of emotions and conduct: Principal | ICD-10-CM | POA: Diagnosis present

## 2011-02-09 DIAGNOSIS — T39094A Poisoning by salicylates, undetermined, initial encounter: Secondary | ICD-10-CM

## 2011-02-09 DIAGNOSIS — T364X1A Poisoning by tetracyclines, accidental (unintentional), initial encounter: Secondary | ICD-10-CM

## 2011-02-09 DIAGNOSIS — Z79899 Other long term (current) drug therapy: Secondary | ICD-10-CM

## 2011-02-09 DIAGNOSIS — T394X2A Poisoning by antirheumatics, not elsewhere classified, intentional self-harm, initial encounter: Secondary | ICD-10-CM

## 2011-02-09 DIAGNOSIS — Z7982 Long term (current) use of aspirin: Secondary | ICD-10-CM

## 2011-02-09 DIAGNOSIS — T50992A Poisoning by other drugs, medicaments and biological substances, intentional self-harm, initial encounter: Secondary | ICD-10-CM

## 2011-02-09 DIAGNOSIS — F1010 Alcohol abuse, uncomplicated: Secondary | ICD-10-CM

## 2011-02-09 DIAGNOSIS — K219 Gastro-esophageal reflux disease without esophagitis: Secondary | ICD-10-CM

## 2011-02-09 LAB — CBC
HCT: 39.3 % (ref 39.0–52.0)
Hemoglobin: 14.5 g/dL (ref 13.0–17.0)
MCH: 33.8 pg (ref 26.0–34.0)
MCHC: 36.9 g/dL — ABNORMAL HIGH (ref 30.0–36.0)
MCV: 91.6 fL (ref 78.0–100.0)

## 2011-02-09 LAB — DIFFERENTIAL
Basophils Relative: 1 % (ref 0–1)
Eosinophils Absolute: 0.1 10*3/uL (ref 0.0–0.7)
Monocytes Absolute: 0.5 10*3/uL (ref 0.1–1.0)
Monocytes Relative: 12 % (ref 3–12)

## 2011-02-09 MED ORDER — POTASSIUM CHLORIDE CRYS ER 20 MEQ PO TBCR
40.0000 meq | EXTENDED_RELEASE_TABLET | Freq: Two times a day (BID) | ORAL | Status: DC
  Administered 2011-02-09 – 2011-02-13 (×8): 40 meq via ORAL
  Filled 2011-02-09: qty 2
  Filled 2011-02-09: qty 56
  Filled 2011-02-09: qty 1
  Filled 2011-02-09: qty 2
  Filled 2011-02-09: qty 56
  Filled 2011-02-09 (×2): qty 2
  Filled 2011-02-09: qty 1
  Filled 2011-02-09 (×5): qty 2

## 2011-02-09 MED ORDER — PANTOPRAZOLE SODIUM 40 MG PO TBEC
40.0000 mg | DELAYED_RELEASE_TABLET | Freq: Every day | ORAL | Status: DC
  Administered 2011-02-10 – 2011-02-13 (×4): 40 mg via ORAL
  Filled 2011-02-09 (×5): qty 1
  Filled 2011-02-09: qty 14

## 2011-02-09 MED ORDER — ALUM & MAG HYDROXIDE-SIMETH 200-200-20 MG/5ML PO SUSP: 30.0000 mL | ORAL | Status: DC | PRN

## 2011-02-09 MED ORDER — ACETAMINOPHEN 325 MG PO TABS
650.0000 mg | ORAL_TABLET | Freq: Four times a day (QID) | ORAL | Status: DC | PRN
Start: 2011-02-09 — End: 2011-02-13

## 2011-02-09 MED ORDER — TRAZODONE HCL 50 MG PO TABS
50.0000 mg | ORAL_TABLET | Freq: Every evening | ORAL | Status: DC | PRN
  Administered 2011-02-10 – 2011-02-12 (×3): 50 mg via ORAL
  Filled 2011-02-09 (×4): qty 1

## 2011-02-09 MED ORDER — ADULT MULTIVITAMIN W/MINERALS CH: 1.0000 | ORAL_TABLET | Freq: Every day | ORAL | Status: DC

## 2011-02-09 MED ORDER — VITAMIN B-1 100 MG PO TABS
100.0000 mg | ORAL_TABLET | Freq: Every day | ORAL | Status: DC
  Administered 2011-02-10 – 2011-02-13 (×4): 100 mg via ORAL
  Filled 2011-02-09 (×2): qty 1
  Filled 2011-02-09: qty 14
  Filled 2011-02-09 (×3): qty 1

## 2011-02-09 MED ORDER — ACETAMINOPHEN 325 MG PO TABS: 650.0000 mg | ORAL_TABLET | Freq: Four times a day (QID) | ORAL | Status: DC | PRN

## 2011-02-09 MED ORDER — DOXYCYCLINE HYCLATE 100 MG PO TABS
100.0000 mg | ORAL_TABLET | Freq: Two times a day (BID) | ORAL | Status: DC
  Administered 2011-02-09: 100 mg via ORAL
  Filled 2011-02-09 (×3): qty 1

## 2011-02-09 MED ORDER — MAGNESIUM HYDROXIDE 400 MG/5ML PO SUSP: 30.0000 mL | Freq: Every day | ORAL | Status: DC | PRN

## 2011-02-09 MED ORDER — FOLIC ACID 1 MG PO TABS: 1.0000 mg | ORAL_TABLET | Freq: Every day | ORAL | Status: DC

## 2011-02-09 MED ORDER — MAGNESIUM OXIDE 400 MG PO TABS: 400.0000 mg | ORAL_TABLET | Freq: Two times a day (BID) | ORAL | Status: DC

## 2011-02-09 MED ORDER — CITALOPRAM HYDROBROMIDE 20 MG PO TABS
20.0000 mg | ORAL_TABLET | Freq: Every day | ORAL | Status: DC
  Administered 2011-02-09 – 2011-02-12 (×4): 20 mg via ORAL
  Filled 2011-02-09 (×5): qty 1
  Filled 2011-02-09: qty 14
  Filled 2011-02-09: qty 1

## 2011-02-09 MED ORDER — FOLIC ACID 1 MG PO TABS
1.0000 mg | ORAL_TABLET | Freq: Every day | ORAL | Status: DC
  Administered 2011-02-10 – 2011-02-13 (×4): 1 mg via ORAL
  Filled 2011-02-09: qty 14
  Filled 2011-02-09 (×5): qty 1

## 2011-02-09 MED ORDER — THIAMINE HCL 100 MG PO TABS: 100.0000 mg | ORAL_TABLET | Freq: Every day | ORAL | Status: DC
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
## 2011-02-09 NOTE — Progress Notes (Signed)
Patient ID: Mitchell [name], male   DOB: Apr 22, 1975, 36 y.o.   MRN: [id number] Pt attempted suicide by OD on his antibiotics and aspirin and drinking them down with beer. Pt states he is depressed due to two stressor his foot is taking longer than expected to heal and so he is unable to work like he use to. Therefore he is unable to pay rent and keep a roof over his families head. Pt feels that he is not able to take care of his family like he use to so it caused him to be depressed and want to commit suicide. Pt is pleasant and cooperative. Pt primary language is spanish and needs a Nurse, learning disability. Pt also has an infection to his right foot that requires dressing changes.
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
## 2011-02-09 NOTE — Progress Notes (Signed)
Patient ID: Mitchell [name], male   DOB: Feb 27, 1975, 36 y.o.   MRN: [id number] Patient needs spanish interpreter which he had this afternoon and is returning tonight for group.  Patient uses wheelchair for right foot problem.   Dressing changed per doctor order after patient showered.   Patient went to dining room for dinner in wheelchair.   Patient denied SI and HI.   Denied A/V hallucinations.   Denied pain.  Patient has been cooperative and pleasant.

## 2011-02-09 NOTE — Progress Notes (Signed)
Per [facility], bed available.  Notified Pt, RN and MD.    Pt notified family.  Pt signed consents.  Consents faxed to [facility].  Pt to be d/c'd.  Providence Crosby, LCSWA Clinical Social Work [phone]
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
## 2011-02-09 NOTE — Progress Notes (Signed)
Per [facility], Pt has been accepted.  Awaiting a bed.  Interpreter contacted Pt's sister, Eugenio Hoes, at [phone], for collateral information.  Per sister, Pt is in need of help for his mental health.  Sister stated that Pt has been encouraged in the past to get help but that he's been reluctant to seek help.  When asked, sister stated that she didn't have any concerns regarding Pt's children's care in the home.  She stated that she is concerned, however, that Pt's 61-year-old seems to be acting out what he's hearing and seeing his father do.  The son will say that he wants to kill himself, that he's going to cut his veins like his father.  Pt's sister stated that the family has been concerned about this and they have sought assistance from a therapist.  CSW to continue to follow.  Providence Crosby, LCSWA Clinical Social Work [phone]

## 2011-02-10 MED ORDER — MINOCYCLINE HCL 100 MG PO CAPS
100.0000 mg | ORAL_CAPSULE | ORAL | Status: DC
  Administered 2011-02-10 – 2011-02-13 (×7): 100 mg via ORAL
  Filled 2011-02-10: qty 1
  Filled 2011-02-10: qty 10
  Filled 2011-02-10 (×2): qty 1
  Filled 2011-02-10: qty 10
  Filled 2011-02-10 (×7): qty 1

## 2011-02-10 NOTE — Progress Notes (Signed)
Recreation Therapy Notes  02/10/2011         Time: 1430      Group Topic/Focus: The focus of the group is on enhancing the patients' ability to cope with stressors. Patients participated in a relaxation exercise with components of guided imagery, progressive muscle relaxation, and positive affirmations.   Participation Level: Active  Participation Quality: Appropriate and Attentive  Affect: Blunted  Cognitive: Oriented   Additional Comments: None.   Avanell Banwart 02/10/2011 4:15 PM

## 2011-02-10 NOTE — Progress Notes (Signed)
Patient ID: Mitchell [name], male   DOB: 1975-04-18, 36 y.o.   MRN: [id number]  Interpretor was with the pt during group and most of evening shift. Pt reported depressed because due to the injury and condition of his rt foot, it hours were cut from 40 to 10. Stated he couldn't pay the rent and keep a roof over his family's head.  Pt has 2 kids ages 86 and 48 and with his live in girlfriend. 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  Pt reported that even though his alcohol level was 366, however pt states he doesn't drink and only had it that evening that he attempted to commit suicide.

## 2011-02-10 NOTE — [facility] Suicide Risk Assessment (Signed)
Suicide Risk Assessment  Admission Assessment     Demographic factors:  Assessment Details Time of Assessment: Admission Information Obtained From: Patient  Current Mental Status: Patient seen and evaluated in treatment team with interpreter. Chart reviewed. Patient stated that his mood was "not good". His affect was mood congruent and constricted. He denied any current thoughts of self injurious behavior, suicidal ideation or homicidal ideation. He contracted for safety on the unit.  There were no auditory or visual hallucinations, paranoia, delusional thought processes, or mania noted.  Thought process was linear and goal directed.  No psychomotor agitation or retardation was noted. His speech was normal rate, tone and volume. Eye contact was good. Judgment and insight are limited.  Patient has been up and engaged on the unit.  No acute safety concerns reported from team.   Loss Factors:  Loss Factors: Financial problems / change in socioeconomic status; trouble supporting his family s/p reduction in work hours  Historical Factors: Pt denied psych history and any prior suicide attempts.  No hx on antidepressants.  Denied regular alcohol or drug use.  Requested move to [address].  Risk Reduction Factors:  Risk Reduction Factors: Responsible for children under 69 years of age;Sense of responsibility to family;Employed;Positive social support;Positive therapeutic relationship  CLINICAL FACTORS: r/o AD with mixed depressive and anxiety s/s vs. Depressive Disorder NOS; Alcohol Use (no regular alcohol or drug use reported, no w/d s/s at this time).   COGNITIVE FEATURES THAT CONTRIBUTE TO RISK: none.  SUICIDE RISK: Pt viewed as a moderate chronic increased risk of harm to self in light of his past hx and risk factors/recent attempt.  No acute safety concerns on the unit.  Pt contracting for safety and in need of crisis stabilization & Tx.  Meds:   . citalopram  20 mg Oral QHS  . folic acid  1 mg  Oral Daily  . minocycline  100 mg Oral BH-qamhs  . pantoprazole  40 mg Oral Q breakfast  . potassium chloride  40 mEq Oral BID  . thiamine  100 mg Oral Daily  . DISCONTD: doxycycline  100 mg Oral Q12H    PLAN OF CARE: Pt admitted for crisis stabilization and treatment.  Please see orders.   Medications reviewed with pt and medication education provided.  Pt seen and evaluated with treatment team and interpreter.  Will continue q15 minute checks per unit protocol.  No clinical indication for one on one level of observation at this time.  Pt contracting for safety.  Mental health treatment, medication management and continued sobriety will mitigate against the increased risk of harm to self and/or others.  Discussed the importance of Tx with pt, as well as, tools to move forward in a healthy & safe manner.  Pt agreeable with the plan.  Discussed with the team.  Will transfer to [address] if possible, per pt request.  Lupe Carney 02/10/2011, 11:11 AM

## 2011-02-10 NOTE — Progress Notes (Signed)
Pt stated via interpreter that he denied SI/HI, denies pain, has no medical problems. Pt is moving to [address] via MD orders Pleasant and cooperative

## 2011-02-10 NOTE — H&P (Signed)
Pt seen and evaluated upon admission with treatment team and interpreter.  Completed Admission Suicide Risk Assessment.  See orders.  Pt agreeable with plan.  Discussed with team.  Agree with A/P.
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
## 2011-02-10 NOTE — Tx Team (Signed)
Interdisciplinary Treatment Plan Update (Adult)  Date:  02/10/2011  Time Reviewed:  10:01 AM   Progress in Treatment: Attending groups: Yes Participating in groups:  Yes Taking medication as prescribed: Yes Tolerating medication:  Yes Family/Significant othe contact made:  Counselor assessing for appropriate contact Patient understands diagnosis:  Yes Discussing patient identified problems/goals with staff:  Yes Medical problems stabilized or resolved:  Yes Denies suicidal/homicidal ideation: Yes Issues/concerns per patient self-inventory:  None identified Other: N/A  New problem(s) identified: None Identified  Reason for Continuation of Hospitalization: Depression Medication stabilization Suicidal ideation  Interventions implemented related to continuation of hospitalization: mood stabilization, medication monitoring and adjustment, group therapy and psycho education, safety checks q 15 mins  Additional comments: Pt is Spanish speaking - translator used during treatment team  Estimated length of stay: 3-5 days  Discharge Plan: SW will refer pt to appropriate follow up   New goal(s): N/A  Review of initial/current patient goals per problem list:    1.  Goal(s): Address substance use  Met:  Yes  Target date: by discharge  As evidenced by: Pt used alcohol as a suicide attempt - assessed and alcohol doesn't appear to be an issue  2.  Goal (s): Reduce depressive symptoms  Met:  No  Target date: by discharge  As evidenced by: Reducing depression from a 10 to a 3 as reported by pt.    3.  Goal(s): Eliminate suicidal ideation  Met:  No  Target date: by discharge  As evidenced by: pt denying SI   Attendees: Patient:  Mitchell [name] 02/10/2011 10:03 AM   Family:     Physician:  Lupe Carney, DO 02/10/2011 10:01 AM   Nursing: Carolynn Comment, RN 02/10/2011 10:01 AM   Case Manager:  Reyes Ivan, LCSWA 02/10/2011  10:01 AM   Counselor:  Ronda Fairly,  LCSWA 02/10/2011  10:01 AM   Other:  Richelle Ito, LCSW 02/10/2011 10:01 AM   Other:  Ewing Schlein, RN 02/10/2011 10:04 AM   Other:  Tanya Nones, SW intern 02/10/2011 10:04 AM   Other:      Scribe for Treatment Team:   Reyes Ivan 02/10/2011 10:01 AM
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
## 2011-02-10 NOTE — Progress Notes (Signed)
Pt denies pain, denies medical problems, denies SI/HI. Pt seems appropriate and cooperative, smiling. No meds started yet.

## 2011-02-11 DIAGNOSIS — F329 Major depressive disorder, single episode, unspecified: Secondary | ICD-10-CM

## 2011-02-11 DIAGNOSIS — F3289 Other specified depressive episodes: Secondary | ICD-10-CM

## 2011-02-11 NOTE — Progress Notes (Signed)
Patient ID: Mitchell [name], male   DOB: 1975-09-03, 36 y.o.   MRN: [id number] Pt. Reports depression at 5 out of 10. He denies pain, but does request right foot dressing after shower.  Writer redressed and assessed foot, no drainage no redness. Pt. Denies SHI. Monica(interpeuter) available. Staff will continue to monitor q92min for safety.

## 2011-02-11 NOTE — Progress Notes (Signed)
[facility] MD Progress Note  02/11/2011 11:35 AM  S/O: Patient seen and evaluated with interpreter. Chart reviewed. Patient stated that his mood was "better". His affect was mood congruent and brighter. He denied any current thoughts of self injurious behavior, suicidal ideation or homicidal ideation. He contracted for safety on the unit. There were no auditory or visual hallucinations, paranoia, delusional thought processes, or mania noted. Thought process was linear and goal directed. No psychomotor agitation or retardation was noted. His speech was normal rate, tone and volume. Eye contact was good. Judgment and insight are limited. Patient has been up and engaged on the unit. No acute safety concerns reported from team.    Sleep:  Number of Hours: 5.25   Vital Signs:Blood pressure 120/82, pulse 101, temperature 97.4 F (36.3 C), temperature source Oral, resp. rate 18, height 5\' 2"  (1.575 m), weight 67.586 kg (149 lb).  Lab Results: No results found for this or any previous visit (from the past 48 hour(s)).  Meds:   . citalopram  20 mg Oral QHS  . folic acid  1 mg Oral Daily  . minocycline  100 mg Oral BH-qamhs  . pantoprazole  40 mg Oral Q breakfast  . potassium chloride  40 mEq Oral BID  . thiamine  100 mg Oral Daily    A/P: Depressive Disorder NOS; r/o Alcohol Abuse.    Will continue current meds and programming on 500, discussed with Dr. Dan Humphreys.  Will continue to follow.  Medication education completed.  Pros, cons, risks, potential side effects and benefits (including no treatment) were discussed with pt.  Pt agreeable with the plan.  See orders.  Discussed with team.   Lupe Carney 02/11/2011, 11:35 AM

## 2011-02-11 NOTE — Progress Notes (Signed)
[facility] Group Notes:  (Counselor/Nursing/MHT/Case Management/Adjunct)  02/11/2011 2:52 PM  Type of Therapy:  Group Therapy  Participation Level:  Active  Participation Quality:  Appropriate and Attentive  Affect:  Appropriate  Cognitive:  Alert and Appropriate  Insight:  Limited  Engagement in Group:  Limited  Engagement in Therapy:  Limited  Modes of Intervention:  Education and Exploration  Summary of Progress/Problems: Patient participated in relaxation technique using Love and Care Meditation.   Mitchell [name] 02/11/2011, 2:52 PM  Cosigned by: Angus Palms, LCSW

## 2011-02-11 NOTE — Tx Team (Signed)
Initial Interdisciplinary Treatment Plan  PATIENT STRENGTHS: (choose at least two) Ability for insight Average or above average intelligence Supportive family/friends  PATIENT STRESSORS: Financial difficulties Health problems Occupational concerns Substance abuse   PROBLEM LIST: Problem List/Patient Goals Date to be addressed Date deferred Reason deferred Estimated date of resolution  Suicidal attempt by OD      ETOH abuse      Rt foot injury      Depression                                     DISCHARGE CRITERIA:  Ability to meet basic life and health needs Improved stabilization in mood, thinking, and/or behavior Medical problems require only outpatient monitoring Motivation to continue treatment in a less acute level of care Need for constant or close observation no longer present Reduction of life-threatening or endangering symptoms to within safe limits Safe-care adequate arrangements made Verbal commitment to aftercare and medication compliance Withdrawal symptoms are absent or subacute and managed without 24-hour nursing intervention  PRELIMINARY DISCHARGE PLAN: Attend aftercare/continuing care group Attend 12-step recovery group Outpatient therapy Participate in family therapy Return to previous living arrangement Return to previous work or school arrangements  PATIENT/FAMIILY INVOLVEMENT: This treatment plan has been presented to and reviewed with the patient, Mitchell [name], and/or family member.  The patient and family have been given the opportunity to ask questions and make suggestions.  Mitchell [name] 02/11/2011, 5:11 AM

## 2011-02-11 NOTE — Progress Notes (Signed)
Pt attended discharge planning group and actively participated with his translator.  Pt presents with calm mood and affect.  Pt ranks depression and anxiety at a 6 today.  Pt denies SI.  Pt states he has enjoyed groups on the 500 hall because he feels he can relax and learning how to control her anxiety.  Pt continues to minimize his drinking pattern.  Pt referred to [facility] of the [place] for medication management and therapy, as they have Spanish speakers there.  No further needs at this time.  Safety planning and suicide prevention discussed.     Mitchell [name], LCSWA 02/11/2011  10:53 AM

## 2011-02-11 NOTE — Progress Notes (Signed)
Patient ID: Mitchell [name], male   DOB: [birth year], 36 y.o.   MRN: [id number] Pt. Denies pain, dressing in tact and dry on right foot. Pt. Denies SHI. Pt. In wheelchair, maneuvering well. Staff will assist as needed. Staff will monitor q71min for safety.
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
## 2011-02-11 NOTE — Progress Notes (Signed)
Pt attends groups and interpreter is there with him. Pt has had no complaints. Pt dressing on his right foot is dry and intact. Pt was offered support and encouragement. Pt receptive to treatment and safety maintained on unit.

## 2011-02-12 NOTE — [facility] Suicide Risk Assessment (Signed)
Suicide Risk Assessment  Discharge Assessment     Demographic factors:  See chart.  Current Mental Status: Patient seen and evaluated with interpreter. Chart reviewed. Patient stated that his mood was "better". His affect was mood congruent and brighter. He denied any current thoughts of self injurious behavior, suicidal ideation or homicidal ideation. He contracted for safety on the unit and requested d/c home on Saturday. There were no auditory or visual hallucinations, paranoia, delusional thought processes, or mania noted. Thought process was linear and goal directed. No psychomotor agitation or retardation was noted. His speech was normal rate, tone and volume. Eye contact was good. Judgment and insight are limited. Patient has been up and engaged on the unit. No acute safety concerns reported from team.   Loss Factors:  Financial problems / change in socioeconomic status; trouble supporting his family s/p reduction in work hours  Historical Factors: Pt denied psych history and any prior suicide attempts.  No hx on antidepressants.  Denied regular alcohol or drug use.    Risk Reduction Factors:  Responsible for children under age 59 years of age;Sense of responsibility to family;Employed;Positive social support;Positive therapeutic relationship; willingness for Tx/meds  CLINICAL FACTORS: Depressive Disorder NOS  COGNITIVE FEATURES THAT CONTRIBUTE TO RISK: none.  SUICIDE RISK: Pt viewed as a chronic increased risk of harm to self in light of his past hx and risk factors/recent attempt.  No acute safety concerns since on the unit.  Pt contracting for safety and requesting discharge.  Meds:   . citalopram  20 mg Oral QHS  . folic acid  1 mg Oral Daily  . minocycline  100 mg Oral BH-qamhs  . pantoprazole  40 mg Oral Q breakfast  . potassium chloride  40 mEq Oral BID  . thiamine  100 mg Oral Daily    PLAN OF CARE: Pt seen and evaluated with interpreter.  Chart reviewed.  Pt stable for  and requesting discharge on Saturday am. Pt contracting for safety and does not currently meet [facility] involuntary commitment criteria for continued hospitalization.  Mental health treatment, medication management and continued sobriety will mitigate against the increased risk of harm to self and/or others.  Discussed the importance of Tx further with pt, as well as, tools to move forward in a healthy & safe manner.  Pt agreeable with the plan.  Discussed with the team.  Please see orders, follow up plans per team and full discharge summary to be completed by physician extender.   Lupe Carney 02/12/2011, 5:34 PM

## 2011-02-12 NOTE — Progress Notes (Signed)
[facility] Group Notes: (Counselor/Nursing/MHT/Case Management/Adjunct) 02/12/2011   @  11:00am   Type of Therapy:  Group Therapy  Participation Level:  Limited  Participation Quality:  Attentive, Appropriate  Affect:  Appropriate  Cognitive:  Appropriate  Insight:  Limited  Engagement in Group: Good  Engagement in Therapy:  Limited  Modes of Intervention:  Support and Exploration  Summary of Progress/Problems: Mitchell [name] was very attentive, though he did not share much. At the end of group, [name] did state that the relaxation exercises he learned here were very helpful to him, and he would like to use them in his everyday life after discharge.    [name] 02/12/2011 12:24 PM

## 2011-02-12 NOTE — Progress Notes (Addendum)
[facility] Adult Inpatient Family/Significant Other Suicide Prevention Education  Suicide Prevention Education:  Education Completed;  Mitchell [name], wife,  has been identified by the patient as the family member/significant other with whom the patient will be residing, and identified as the person(s) who will aid the patient in the event of a mental health crisis (suicidal ideations/suicide attempt).  With written consent from the patient, the family member/significant other has been provided the following suicide prevention education, prior to the and/or following the discharge of the patient.   * Interpreter was used for this call.   The suicide prevention education provided includes the following:  Suicide risk factors  Suicide prevention and interventions  National Suicide Hotline telephone number  [facility] assessment telephone number  [facility] Emergency Assistance 911  [facility] and/or Residential Mobile Crisis Unit telephone number  Request made of family/significant other to:  Remove weapons (e.g., guns, rifles, knives), all items previously/currently identified as safety concern.    Remove drugs/medications (over-the-counter, prescriptions, illicit drugs), all items previously/currently identified as a safety concern.  Mitchell [name] reports she does not believe Mitchell [name] is a danger to himself or others, but points out that she cannot be sure as she has not seen him since he entered the hospital. She reported that he does not have access to any weapons, and that he does not have many risk factors for suicide. Mitchell [name], via interpreter, verbalized understanding of suicide prevention information and had no further questions. Counselor informed her that a Spanish version of suicide prevention information will be included in discharge paperwork and encouraged her to talk to [place] about it.   Mitchell [name] 02/12/2011, 2:51 PM

## 2011-02-12 NOTE — Progress Notes (Signed)
Pt attended discharge planning group and actively participated.  Pt's translator was not present for group but pt attempted to discuss and interacted with peers.  SW met with pt later with translator present.  SW discussed the CPS report, as SW receieved a message from the CPS worker yesterday.  Pt states the CPS report was made due to pt attempting suicide in the presence of the children.  Pt states there were no other concerns mentioned in the report when the CPS worker visited him yesterday.  SW attempted to contact the CPS worker today and left a voicemail message.  Pt ranks depression at a 5 and anxiety at a 0 today.  Pt denies SI.  Pt states he has gained great coping skills while being here.  Pt states he has learned to improve his self esteem, think before reacting and meditate.  Pt states that mediation is the best skill he learned and requested a relaxation CD from the counselor.  Pt plans to return back to work on Monday and spend time with his children this weekend.  Pt states his wife will provide transportation home.  No further needs at this time.  Safety planning and suicide prevention discussed.     Mitchell [name], LCSWA 02/12/2011  12:06 PM

## 2011-02-12 NOTE — Progress Notes (Signed)
[facility] Group Notes:  (Counselor/Nursing/MHT/Case Management/Adjunct)  02/12/2011 1:15pm  Type of Therapy:  Psychoeducational Skills - [facility] [place]  Participation Level:  Active  Participation Quality:  Appropriate and Attentive  Affect:  Blunted  Cognitive:  Appropriate  Insight:  Good  Engagement in Group:  Good  Engagement in Therapy:  None  Modes of Intervention:  Education and Support  Summary of Progress/Problems: [name] was attentive during the group and expressed interest in [facility] services.   [name] 02/12/2011  2:25 PM

## 2011-02-12 NOTE — Progress Notes (Signed)
Pt rates depression at a 4 and hopelessness at a 1. Pt attends groups and interacts well with peers and staff. Pt was offered support and encouragement. Pt states he is feeling better. Pt also had his dressing on his right foot changed today. Pt dressing is dry and intact. Pt denies SI/HI. Pt is receptive to treatment and safety is maintained on unit.

## 2011-02-12 NOTE — Progress Notes (Signed)
[facility] Case Management Discharge Plan:  Will you be returning to the same living situation after discharge: Yes,  return to own home At discharge, do you have transportation home?:Yes,  wife to provide transportation Do you have the ability to pay for your medications:Yes,  access to meds  Interagency Information:   Release of information consent forms completed and in the chart; pt's signature needed at discharge.   Release of information consent forms completed and in the chart;  Patient's signature needed at discharge.  Patient to Follow up at:  Follow-up Information    Follow up with [facility] of the [place] on 02/15/2011. (Walk in clinic Monday - Friday 8-12 pm, 1-3 pm)    Contact [address]:   [address] [place], [place] [address] [phone]         Patient denies SI/HI:   Yes,  pt denies    Safety Planning and Suicide Prevention discussed:  Yes,  discussed with pt today  Barrier to discharge identified:No.  Summary and Recommendations: Pt to follow up at [facility] where they are able to provide Spanish speaking pts services.  No recommendations from SW.  No further needs voiced by pt.  Pt stable to discharge.     Carmina Miller 02/12/2011, 12:07 PM

## 2011-02-13 MED ORDER — TRAZODONE HCL 50 MG PO TABS: 50.0000 mg | ORAL_TABLET | Freq: Every evening | ORAL | Status: DC | PRN

## 2011-02-13 MED ORDER — CITALOPRAM HYDROBROMIDE 20 MG PO TABS: 20.0000 mg | ORAL_TABLET | Freq: Every day | ORAL | Status: DC

## 2011-02-13 MED ORDER — MINOCYCLINE HCL 100 MG PO CAPS: 100.0000 mg | ORAL_CAPSULE | ORAL | Status: DC

## 2011-02-13 NOTE — Progress Notes (Signed)
Report received from C. Derrell Lolling RN who received report from N. Armanda Magic RN while Clinical research associate was admitting a patient. Writer observed patient lying in bed asleep with eyes closed resp. even and unlabored, no distress noted. Safety maintained on unit, will continue to monitor.

## 2011-02-13 NOTE — Progress Notes (Signed)
Patient ID: Mitchell [name], male   DOB: 07-Feb-1975, 36 y.o.   MRN: [id number] 02/13/2011 Mitchell [name] is DC'd today per MD order. HE is given DC instructions ( with interpreter present) as well as DC sample meds and presriptions. He states he understands these instructions and can and will comply. He signed consent for release of his medical record and then his suicide risk assessment are completed by this nurse.He denies audit, vis, and tactile halluc as well as suicidal ideation. His belongings are returned to him and he is escorted off the unit as pper POC PD RN [facility]
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
## 2011-02-13 NOTE — Progress Notes (Signed)
[facility] Group Notes:  (Counselor/Nursing/MHT/Case Management/Adjunct)  02/13/2011 1315pm  Type of Therapy:  Group Therapy  Participation Level:  Active  Participation Quality:  Appropriate, Attentive, and Sharing  Affect:  Appropriate  Cognitive:  Appropriate  Insight:  Good  Engagement in Group:  Good  Engagement in Therapy:  Good  Modes of Intervention:  Clarification, Education, Problem-solving and Socialization  Summary of Progress/Problems:  Pt. Attended and participated in group on Self-Sabotage and Enabling behaviors and beliefs. Pt. Identified a self-sabotaging belief/behavior and processed on how to break-free from their self-sabotage. Pt. Stated that he cannot identify a sabotaging belief or behavior. Pt. Stated that during a crisis situation he will start to reach out more to others to communicate.   Mitchell [name] 02/13/2011, 3:13 PM

## 2011-02-16 NOTE — Progress Notes (Signed)
Patient Discharge Instructions:  Admission Note Faxed,  02/16/2011 After Visit Summary Faxed,  02/16/2011 Faxed to the Next Level Care provider:  02/16/2011 Facesheet faxed 02/16/2011   Faxed to [facility] @ [phone]  Wandra Scot, 02/16/2011, 2:40 PM
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
## 2011-03-09 ENCOUNTER — Encounter: Payer: Self-pay | Admitting: Family Medicine

## 2011-03-09 ENCOUNTER — Ambulatory Visit (INDEPENDENT_AMBULATORY_CARE_PROVIDER_SITE_OTHER): Payer: Self-pay | Admitting: Family Medicine

## 2011-03-09 VITALS — BP 118/76 | HR 75 | Temp 97.7°F | Wt 157.6 lb

## 2011-03-09 DIAGNOSIS — F4325 Adjustment disorder with mixed disturbance of emotions and conduct: Secondary | ICD-10-CM

## 2011-03-09 DIAGNOSIS — M869 Osteomyelitis, unspecified: Secondary | ICD-10-CM

## 2011-03-09 MED ORDER — CITALOPRAM HYDROBROMIDE 20 MG PO TABS: 20.0000 mg | ORAL_TABLET | Freq: Every day | ORAL | Status: DC

## 2011-03-09 MED ORDER — TRAZODONE HCL 50 MG PO TABS: 50.0000 mg | ORAL_TABLET | Freq: Every evening | ORAL | Status: DC | PRN

## 2011-03-09 MED ORDER — PANTOPRAZOLE SODIUM 40 MG PO TBEC: 40.0000 mg | DELAYED_RELEASE_TABLET | Freq: Every day | ORAL | Status: DC

## 2011-03-09 NOTE — Progress Notes (Signed)
Subjective: The patient is a 36 y.o. year old male who presents today for followup. The patient was hospitalized at behavioral health for suicide attempt in the middle of January. During this time he was started on Celexa, and taken off of his long-term antibiotics. Since that time, he reports that he has been doing well from an emotional standpoint, although he has been feeling slightly more anxious since he ran out of Celexa about 10 days ago. He also has not been sleeping well since he ran out of his trazodone also about 10 days ago. There is significantly less redness in his foot although he still does have intermittent swelling. He has been off of antibiotics for about the last 10 days.  The patient now has an orange card.  Objective:  Filed Vitals:   03/09/11 1344  BP: 118/76  Pulse: 75  Temp: 97.7 F (36.5 C)   Gen: No acute distress Extremities: Left foot is normal. Right foot shows evidence of previous skin grafting surgeries. There is minimal swelling of the right foot and ankle. Nonhealing ulcer on the right first MTP joint without any purulent drainage. This ulcer measures approximately 1 cm in diameter. There is not any evidence of skin breakdown surrounding it, however the area does bleed easily. 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 Wound at distal end of scar has closed. There is no erythema or streaking.  Assessment/Plan: Adjustment Disorder Chronic oseto  Please also see individual problems in problem list for problem-specific plans.

## 2011-03-09 NOTE — Assessment & Plan Note (Signed)
I will restart the patient on his chronic antibiotics. He still has both Cipro and Doxy. These to her chosen to cover both Pseudomonas and MRSA. I will have him back in 3 weeks. I will also put in a referral to wound care to obtain their expertise in this matter. There is not currently any evidence of cellulitis.

## 2011-03-09 NOTE — Assessment & Plan Note (Signed)
I will restart the patient on both Celexa and trazodone. No current suicidal or homicidal ideation. Patient to return in 3-4 weeks to assess effect.

## 2011-03-09 NOTE — Progress Notes (Signed)
Interpreter Wyvonnia Dusky for Dr Louanne Belton 13.30

## 2011-03-09 NOTE — Patient Instructions (Signed)
It was good to see you today! I want you to start back on both the Cipro and the doxycycline. You'll be taking them twice daily. I would like you to be on them for the next month. I am sending in a referral to our wound care center. I want them to help [place] with your foot wound. Come back to see me in 3-4 weeks.

## 2011-03-11 ENCOUNTER — Encounter (HOSPITAL_BASED_OUTPATIENT_CLINIC_OR_DEPARTMENT_OTHER): Payer: Self-pay | Attending: Internal Medicine

## 2011-03-11 DIAGNOSIS — R209 Unspecified disturbances of skin sensation: Secondary | ICD-10-CM | POA: Insufficient documentation

## 2011-03-11 DIAGNOSIS — Z945 Skin transplant status: Secondary | ICD-10-CM | POA: Insufficient documentation

## 2011-03-11 DIAGNOSIS — L97509 Non-pressure chronic ulcer of other part of unspecified foot with unspecified severity: Secondary | ICD-10-CM | POA: Insufficient documentation

## 2011-03-11 DIAGNOSIS — K219 Gastro-esophageal reflux disease without esophagitis: Secondary | ICD-10-CM | POA: Insufficient documentation
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
## 2011-03-30 ENCOUNTER — Ambulatory Visit (INDEPENDENT_AMBULATORY_CARE_PROVIDER_SITE_OTHER): Payer: Self-pay | Admitting: Family Medicine

## 2011-03-30 ENCOUNTER — Encounter: Payer: Self-pay | Admitting: Family Medicine

## 2011-03-30 DIAGNOSIS — M869 Osteomyelitis, unspecified: Secondary | ICD-10-CM

## 2011-03-30 DIAGNOSIS — F4325 Adjustment disorder with mixed disturbance of emotions and conduct: Secondary | ICD-10-CM

## 2011-03-30 NOTE — Assessment & Plan Note (Signed)
However the patient finished his current prescriptions of antibiotics. When he finishes them he will have approximately 3 months of antibiotic treatment. I feel that this is appropriate treatment for his possible osteo-. I will see the patient back after he has been discharge from the wound care clinic.

## 2011-03-30 NOTE — Assessment & Plan Note (Signed)
Patient is doing well with no substance or alcohol use. I will continue his current medications. No suicidal or homicidal ideation.

## 2011-03-30 NOTE — Progress Notes (Signed)
Interpreter Wyvonnia Dusky for Dr Louanne Belton at 14.00 [facility]

## 2011-03-30 NOTE — Patient Instructions (Signed)
It was great to see you today.

## 2011-03-30 NOTE — Progress Notes (Signed)
Subjective: The patient is a 36 y.o. year old male who presents today for followup of foot wound. The patient is now going to [facility]. He is seeing them on a weekly basis. He does not have any problems with fevers, chills, or redness in the foot. He does continue to have some mild swelling when he stands or walks for long periods of time.  The patient reports that his mood is much improved since January. He has not been drinking any alcohol since that time. He continues to take his medications.  Objective:  Filed Vitals:   03/30/11 1400  BP: 102/61  Pulse: 63  Temp: 98.1 F (36.7 C)   Gen: No acute distress, normal affect Ext: Left foot is normal. Right foot shows evidence of previous skin grafting surgeries. There is minimal swelling of the right foot and ankle. Nonhealing ulcer on the right first MTP joint without any purulent drainage. This ulcer measures approximately 0.5 cm in diameter. There is not any evidence of skin breakdown surrounding it.  There is some callous formation on the forefoot. 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 Wound at distal end of scar has closed. There is no erythema or streaking.   Assessment/Plan:  Please also see individual problems in problem list for problem-specific plans.

## 2011-04-01 ENCOUNTER — Encounter (HOSPITAL_BASED_OUTPATIENT_CLINIC_OR_DEPARTMENT_OTHER): Payer: Self-pay | Attending: Internal Medicine

## 2011-04-01 DIAGNOSIS — Z945 Skin transplant status: Secondary | ICD-10-CM | POA: Insufficient documentation

## 2011-04-01 DIAGNOSIS — L97509 Non-pressure chronic ulcer of other part of unspecified foot with unspecified severity: Secondary | ICD-10-CM | POA: Insufficient documentation

## 2011-04-01 DIAGNOSIS — Z79899 Other long term (current) drug therapy: Secondary | ICD-10-CM | POA: Insufficient documentation

## 2011-04-08 ENCOUNTER — Encounter (HOSPITAL_BASED_OUTPATIENT_CLINIC_OR_DEPARTMENT_OTHER): Payer: Self-pay

## 2011-04-13 ENCOUNTER — Emergency Department (HOSPITAL_COMMUNITY)
Admission: EM | Admit: 2011-04-13 | Discharge: 2011-04-15 | Disposition: A | Discharge status: Other Acute Inpatient Hospital | Payer: Self-pay | Attending: Emergency Medicine | Admitting: Emergency Medicine

## 2011-04-13 ENCOUNTER — Encounter (HOSPITAL_COMMUNITY): Payer: Self-pay | Admitting: Emergency Medicine

## 2011-04-13 DIAGNOSIS — K219 Gastro-esophageal reflux disease without esophagitis: Secondary | ICD-10-CM | POA: Insufficient documentation

## 2011-04-13 DIAGNOSIS — Z79899 Other long term (current) drug therapy: Secondary | ICD-10-CM | POA: Insufficient documentation

## 2011-04-13 DIAGNOSIS — F1010 Alcohol abuse, uncomplicated: Secondary | ICD-10-CM | POA: Insufficient documentation

## 2011-04-13 DIAGNOSIS — IMO0002 Reserved for concepts with insufficient information to code with codable children: Secondary | ICD-10-CM | POA: Insufficient documentation

## 2011-04-13 DIAGNOSIS — R45851 Suicidal ideations: Secondary | ICD-10-CM | POA: Insufficient documentation

## 2011-04-13 DIAGNOSIS — F10929 Alcohol use, unspecified with intoxication, unspecified: Secondary | ICD-10-CM

## 2011-04-13 HISTORY — DX: Mental disorder, not otherwise specified: F99

## 2011-04-13 LAB — CBC
MCH: 32.6 pg (ref 26.0–34.0)
MCHC: 36.5 g/dL — ABNORMAL HIGH (ref 30.0–36.0)
MCV: 89.4 fL (ref 78.0–100.0)
Platelets: 83 10*3/uL — ABNORMAL LOW (ref 150–400)
RBC: 4.32 MIL/uL (ref 4.22–5.81)
RDW: 13.4 % (ref 11.5–15.5)

## 2011-04-13 LAB — COMPREHENSIVE METABOLIC PANEL
ALT: 37 U/L (ref 0–53)
Albumin: 3.4 g/dL — ABNORMAL LOW (ref 3.5–5.2)
Alkaline Phosphatase: 97 U/L (ref 39–117)
BUN: 6 mg/dL (ref 6–23)
Chloride: 106 mEq/L (ref 96–112)
Glucose, Bld: 199 mg/dL — ABNORMAL HIGH (ref 70–99)
Potassium: 3.4 mEq/L — ABNORMAL LOW (ref 3.5–5.1)
Sodium: 140 mEq/L (ref 135–145)
Total Bilirubin: 0.7 mg/dL (ref 0.3–1.2)

## 2011-04-13 LAB — DIFFERENTIAL
Basophils Absolute: 0.1 10*3/uL (ref 0.0–0.1)
Basophils Relative: 1 % (ref 0–1)
Monocytes Relative: 9 % (ref 3–12)
Neutro Abs: 2.6 10*3/uL (ref 1.7–7.7)
Neutrophils Relative %: 43 % (ref 43–77)

## 2011-04-13 LAB — ETHANOL: Alcohol, Ethyl (B): 309 mg/dL — ABNORMAL HIGH (ref 0–11)

## 2011-04-13 LAB — RAPID URINE DRUG SCREEN, HOSP PERFORMED
Amphetamines: NOT DETECTED
Barbiturates: NOT DETECTED

## 2011-04-13 MED ORDER — LORAZEPAM 1 MG PO TABS: 1.0000 mg | ORAL_TABLET | Freq: Three times a day (TID) | ORAL | Status: DC | PRN

## 2011-04-13 MED ORDER — ADULT MULTIVITAMIN W/MINERALS CH
1.0000 | ORAL_TABLET | Freq: Every day | ORAL | Status: DC
  Administered 2011-04-14: 1 via ORAL
  Filled 2011-04-13: qty 1

## 2011-04-13 MED ORDER — CITALOPRAM HYDROBROMIDE 20 MG PO TABS
20.0000 mg | ORAL_TABLET | Freq: Every day | ORAL | Status: DC
  Filled 2011-04-13 (×2): qty 1

## 2011-04-13 MED ORDER — ONDANSETRON HCL 4 MG PO TABS: 4.0000 mg | ORAL_TABLET | Freq: Three times a day (TID) | ORAL | Status: DC | PRN

## 2011-04-13 MED ORDER — NICOTINE 21 MG/24HR TD PT24
21.0000 mg | MEDICATED_PATCH | Freq: Every day | TRANSDERMAL | Status: DC
  Filled 2011-04-13: qty 1

## 2011-04-13 MED ORDER — IBUPROFEN 600 MG PO TABS: 600.0000 mg | ORAL_TABLET | Freq: Three times a day (TID) | ORAL | Status: DC | PRN

## 2011-04-13 MED ORDER — ACETAMINOPHEN 325 MG PO TABS: 650.0000 mg | ORAL_TABLET | ORAL | Status: DC | PRN

## 2011-04-13 MED ORDER — MAGNESIUM OXIDE 400 MG PO TABS
400.0000 mg | ORAL_TABLET | Freq: Two times a day (BID) | ORAL | Status: DC
  Administered 2011-04-14 (×2): 400 mg via ORAL
  Filled 2011-04-13 (×5): qty 1

## 2011-04-13 MED ORDER — ZIPRASIDONE MESYLATE 20 MG IM SOLR
20.0000 mg | Freq: Once | INTRAMUSCULAR | Status: AC
  Administered 2011-04-13: 20 mg via INTRAMUSCULAR
  Filled 2011-04-13: qty 20

## 2011-04-13 MED ORDER — ALUM & MAG HYDROXIDE-SIMETH 200-200-20 MG/5ML PO SUSP: 30.0000 mL | ORAL | Status: DC | PRN

## 2011-04-13 MED ORDER — PANTOPRAZOLE SODIUM 40 MG PO TBEC
40.0000 mg | DELAYED_RELEASE_TABLET | Freq: Every day | ORAL | Status: DC
  Administered 2011-04-14: 40 mg via ORAL
  Filled 2011-04-13: qty 1

## 2011-04-13 MED ORDER — ZOLPIDEM TARTRATE 10 MG PO TABS: 10.0000 mg | ORAL_TABLET | Freq: Every evening | ORAL | Status: DC | PRN
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
## 2011-04-13 NOTE — ED Notes (Signed)
Pt OX. 89% RA. Put pt. On 2L of O2.

## 2011-04-13 NOTE — ED Notes (Signed)
Unable to obtain vital signs due to Pt. Trying to hang himself with blood pressure cord.
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
## 2011-04-13 NOTE — ED Notes (Signed)
Pt's contact is his wife-Rafaela Kelby Fam [phone]

## 2011-04-14 ENCOUNTER — Encounter (HOSPITAL_COMMUNITY): Payer: Self-pay | Admitting: *Deleted

## 2011-04-14 MED ORDER — HYDROMORPHONE HCL PF 1 MG/ML IJ SOLN
1.0000 mg | Freq: Once | INTRAMUSCULAR | Status: AC
  Administered 2011-04-14: 1 mg via INTRAMUSCULAR
  Filled 2011-04-14: qty 1

## 2011-04-14 NOTE — ED Notes (Signed)
Family at bedside, eating dinner.

## 2011-04-14 NOTE — ED Notes (Signed)
Restraints released- sitter remains at bedside. Patient aware of surroundings, answers appropriately. Understands limited [place]

## 2011-04-14 NOTE — BH Assessment (Signed)
Pt accepted to [facility], [place] to [facility], bed 303-2. EDP and RN notified. Support paperwork signed and faxed to [facility].

## 2011-04-14 NOTE — BH Assessment (Signed)
Writer created new IVC paperwork and faxed to magistrate as pt's earlier IVC paperwork and Findings are missing. Writer spoke with English as a second language teacher who completed original IVC paperwork and she was also unable to locate pt's paperwork from when he was in Room 7 before being transferred to [employer] [facility].

## 2011-04-14 NOTE — BH Assessment (Signed)
Assessment Note   Mitchell [name] is a 36 y.o. male who presents to [facility]led with SI, plan to stab self.  Pt is drowsy, lethargic and sleepy due to chemical restraints, the information provided in this assessment is collateral and pt will need to be re-assessed.  Pt requires an interpreter.  While in the ed, pt became agitated and violent.  Pt tried to hang self in ed by using blood pressure cords and other medical apparatus, pt wrapped cord around neck.  Prior to coming to ed, pt attempted to stab self and pt was brought in by spouse.  Pt then became aggressive with medical staff, requiring security and police officer to restrain pt.  Pt was issues chemical restraint(20mg  Geodon) and also placed in 4-point restraints.  Pt is calm and sleeping.  This writer observed wound on the bottom of left foot that has been bandaged, not known what caused wound.  Per ed nurse Hessie Diener, wound will be re-dressed.  As of 3am, right wrist restraint and left restraint were removed from pt.  Pt is now IVC. Pt told interpreter that he has run out of his meds. BAL=309  Axis I: Alcohol Abuse and Mood Disorder NOS Axis II: Deferred Axis III:  Past Medical History  Diagnosis Date  . Burn   . GERD (gastroesophageal reflux disease)   . Alcohol abuse   . Foot osteomyelitis, right   . Alcoholic hepatitis   . Alcoholic gastritis   . Attempted suicide 02/06/2011  . Mental disorder    Axis IV: other psychosocial or environmental problems, problems related to social environment and problems with primary support group Axis V: 21-30 behavior considerably influenced by delusions or hallucinations OR serious impairment in judgment, communication OR inability to function in almost all areas  Past Medical History:  Past Medical History  Diagnosis Date  . Burn   . GERD (gastroesophageal reflux disease)   . Alcohol abuse   . Foot osteomyelitis, right   . Alcoholic hepatitis   . Alcoholic gastritis   . Attempted suicide 02/06/2011  .  Mental disorder     Past Surgical History  Procedure Date  . Leg surgery ~2003    Crush injury to right lower extremity and foot    Family History:  Family History  Problem Relation Age of Onset  . Diabetes type II Sister   . Diabetes Mother   . Diabetes Father     Social History:  reports that he has never smoked. He has never used smokeless tobacco. He reports that he drinks about 1.2 ounces of alcohol per week. He reports that he does not use illicit drugs.  Additional Social History:  Alcohol / Drug Use Pain Medications: None  Prescriptions: None  Over the Counter: None  History of alcohol / drug use?: Yes Substance #1 Name of Substance 1: Alcohol  1 - Age of First Use: Unk  1 - Amount (size/oz): Unk 1 - Frequency: Unk  1 - Duration: Unk  1 - Last Use / Amount: 04/13/11 Allergies: No Known Allergies  Home Medications:  Medications Prior to Admission  Medication Dose Route Frequency Provider Last Rate Last Dose  . acetaminophen (TYLENOL) tablet 650 mg  650 mg Oral Q4H PRN Ward Givens, MD      . alum & mag hydroxide-simeth (MAALOX/MYLANTA) 200-200-20 MG/5ML suspension 30 mL  30 mL Oral PRN Ward Givens, MD      . citalopram (CELEXA) tablet 20 mg  20 mg Oral QHS Iva  Hildred Laser, MD      . ibuprofen (ADVIL,MOTRIN) tablet 600 mg  600 mg Oral Q8H PRN Ward Givens, MD      . LORazepam (ATIVAN) tablet 1 mg  1 mg Oral Q8H PRN Ward Givens, MD      . magnesium oxide (MAG-OX) tablet 400 mg  400 mg Oral BID Ward Givens, MD      . mulitivitamin with minerals tablet 1 tablet  1 tablet Oral Daily Ward Givens, MD      . nicotine (NICODERM CQ - dosed in mg/24 hours) patch 21 mg  21 mg Transdermal Daily Ward Givens, MD      . ondansetron (ZOFRAN) tablet 4 mg  4 mg Oral Q8H PRN Ward Givens, MD      . pantoprazole (PROTONIX) EC tablet 40 mg  40 mg Oral Q0600 Ward Givens, MD      . ziprasidone (GEODON) injection 20 mg  20 mg Intramuscular Once Ward Givens, MD   20 mg at 04/13/11 2050  .  zolpidem (AMBIEN) tablet 10 mg  10 mg Oral QHS PRN Ward Givens, MD       Medications Prior to Admission  Medication Sig Dispense Refill  . citalopram (CELEXA) 20 MG tablet Take 1 tablet (20 mg total) by mouth at bedtime.  30 tablet  1  . magnesium oxide (MAG-OX 400) 400 MG tablet Take 1 tablet (400 mg total) by mouth 2 (two) times daily.  40 tablet  1  . Multiple Vitamin (MULITIVITAMIN WITH MINERALS) TABS Take 1 tablet by mouth daily.  30 tablet  1  . pantoprazole (PROTONIX) 40 MG tablet Take 1 tablet (40 mg total) by mouth daily at 6 (six) AM.  30 tablet  1    OB/GYN Status:  No LMP for male patient.  General Assessment Data Location of Assessment: [facility] Living Arrangements: Spouse/significant other Can pt return to current living arrangement?: Yes Admission Status: Involuntary Is patient capable of signing voluntary admission?: No Transfer from: Acute Hospital Referral Source: MD  Education Status Is patient currently in school?: No Current Grade: None  Highest grade of school patient has completed: None  Name of school: None  Contact person: None   Risk to self Suicidal Ideation: Yes-Currently Present Suicidal Intent: Yes-Currently Present Is patient at risk for suicide?: Yes Suicidal Plan?: Yes-Currently Present Specify Current Suicidal Plan: Hang self, Stab self  Access to Means: Yes Specify Access to Suicidal Means: Ropes, Cords, Knives What has been your use of drugs/alcohol within the last 12 months?: Abuses Alcohol  Previous Attempts/Gestures: Yes How many times?: 0  (Unk ) Other Self Harm Risks: None  Triggers for Past Attempts: None known Intentional Self Injurious Behavior: None Family Suicide History: No Recent stressful life event(s): Other (Comment) (Unk at this time; pt intoxicated ) Persecutory voices/beliefs?: No Depression: Yes Depression Symptoms: Feeling angry/irritable Substance abuse history and/or treatment for substance abuse?: No Suicide  prevention information given to non-admitted patients: Not applicable  Risk to Others Homicidal Ideation: No Thoughts of Harm to Others: No Current Homicidal Intent: No Current Homicidal Plan: No Access to Homicidal Means: No Identified Victim: None  History of harm to others?: No Assessment of Violence: On admission Violent Behavior Description: Pt agitated, agressive with med staff  Does patient have access to weapons?: No Criminal Charges Pending?: No Does patient have a court date: No  Psychosis Hallucinations: None noted Delusions: None noted  Mental Status Report  Appear/Hygiene: Disheveled Eye Contact: Poor Motor Activity: Unremarkable Speech: Unable to assess Level of Consciousness: Unable to assess;Sedated;Sleeping Mood: Angry;Threatening;Irritable Affect: Appropriate to circumstance Anxiety Level: Moderate Thought Processes:  (Unable to assess) Judgement: Impaired Orientation: Person;Place;Time;Situation Obsessive Compulsive Thoughts/Behaviors: None  Cognitive Functioning Concentration: Normal Memory: Recent Intact;Remote Intact IQ: Average Insight: Poor Impulse Control: Poor Appetite: Fair Weight Loss: 0  Weight Gain: 0  Sleep: No Change Total Hours of Sleep: 8  Vegetative Symptoms: None  Prior Inpatient Therapy Prior Inpatient Therapy: No Prior Therapy Dates: None  Prior Therapy Facilty/Provider(s): None  Reason for Treatment: None   Prior Outpatient Therapy Prior Outpatient Therapy: No Prior Therapy Dates: None  Prior Therapy Facilty/Provider(s): None  Reason for Treatment: None   ADL Screening (condition at time of admission) Patient's cognitive ability adequate to safely complete daily activities?: Yes Patient able to express need for assistance with ADLs?: Yes Independently performs ADLs?: Yes Weakness of Legs: None Weakness of Arms/Hands: None       Abuse/Neglect Assessment (Assessment to be complete while patient is  alone) Physical Abuse: Denies Verbal Abuse: Denies Sexual Abuse: Denies Exploitation of patient/patient's resources: Denies Self-Neglect: Denies Values / Beliefs Cultural Requests During Hospitalization: None Spiritual Requests During Hospitalization: None Consults Spiritual Care Consult Needed: No Social Work Consult Needed: No Merchant navy officer (For Healthcare) Advance Directive: Patient does not have advance directive;Patient would not like information Pre-existing out of facility DNR order (yellow form or pink MOST form): No    Additional Information 1:1 In Past 12 Months?: No CIRT Risk: No Elopement Risk: No Does patient have medical clearance?: Yes     Disposition:  Disposition Disposition of Patient: Inpatient treatment program Type of inpatient treatment program: Adult  On Site Evaluation by:   Reviewed with Physician:     Murrell Redden 04/14/2011 4:05 AM

## 2011-04-15 ENCOUNTER — Inpatient Hospital Stay (HOSPITAL_COMMUNITY)
Admission: AD | Admit: 2011-04-15 | Discharge: 2011-04-21 | DRG: 897 | Disposition: A | Discharge status: Home / Self Care | Payer: PRIVATE HEALTH INSURANCE | Source: Ambulatory Visit | Attending: Psychiatry | Admitting: Psychiatry

## 2011-04-15 ENCOUNTER — Encounter (HOSPITAL_COMMUNITY): Payer: Self-pay | Admitting: *Deleted

## 2011-04-15 ENCOUNTER — Ambulatory Visit: Payer: Self-pay | Admitting: Family Medicine

## 2011-04-15 DIAGNOSIS — F1994 Other psychoactive substance use, unspecified with psychoactive substance-induced mood disorder: Secondary | ICD-10-CM

## 2011-04-15 DIAGNOSIS — R45851 Suicidal ideations: Secondary | ICD-10-CM

## 2011-04-15 DIAGNOSIS — M79609 Pain in unspecified limb: Secondary | ICD-10-CM

## 2011-04-15 DIAGNOSIS — K219 Gastro-esophageal reflux disease without esophagitis: Secondary | ICD-10-CM

## 2011-04-15 DIAGNOSIS — Z79899 Other long term (current) drug therapy: Secondary | ICD-10-CM

## 2011-04-15 DIAGNOSIS — F4325 Adjustment disorder with mixed disturbance of emotions and conduct: Secondary | ICD-10-CM

## 2011-04-15 DIAGNOSIS — F1020 Alcohol dependence, uncomplicated: Principal | ICD-10-CM

## 2011-04-15 MED ORDER — CHLORDIAZEPOXIDE HCL 25 MG PO CAPS
25.0000 mg | ORAL_CAPSULE | Freq: Every day | ORAL | Status: AC
  Administered 2011-04-18: 25 mg via ORAL
  Filled 2011-04-15: qty 1

## 2011-04-15 MED ORDER — CITALOPRAM HYDROBROMIDE 20 MG PO TABS
20.0000 mg | ORAL_TABLET | Freq: Every day | ORAL | Status: DC
  Administered 2011-04-15 – 2011-04-20 (×6): 20 mg via ORAL
  Filled 2011-04-15 (×8): qty 1

## 2011-04-15 MED ORDER — IBUPROFEN 800 MG PO TABS
800.0000 mg | ORAL_TABLET | ORAL | Status: AC
  Administered 2011-04-15: 800 mg via ORAL
  Filled 2011-04-15 (×2): qty 1

## 2011-04-15 MED ORDER — MAGNESIUM HYDROXIDE 400 MG/5ML PO SUSP: 30.0000 mL | Freq: Every day | ORAL | Status: DC | PRN

## 2011-04-15 MED ORDER — IBUPROFEN 600 MG PO TABS
600.0000 mg | ORAL_TABLET | Freq: Three times a day (TID) | ORAL | Status: DC
  Administered 2011-04-15 – 2011-04-21 (×24): 600 mg via ORAL
  Filled 2011-04-15 (×35): qty 1

## 2011-04-15 MED ORDER — NICOTINE 21 MG/24HR TD PT24
21.0000 mg | MEDICATED_PATCH | Freq: Every day | TRANSDERMAL | Status: DC
  Filled 2011-04-15 (×2): qty 1

## 2011-04-15 MED ORDER — LOPERAMIDE HCL 2 MG PO CAPS: 2.0000 mg | ORAL_CAPSULE | ORAL | Status: AC | PRN

## 2011-04-15 MED ORDER — CHLORDIAZEPOXIDE HCL 25 MG PO CAPS: 25.0000 mg | ORAL_CAPSULE | Freq: Four times a day (QID) | ORAL | Status: AC | PRN

## 2011-04-15 MED ORDER — ALUM & MAG HYDROXIDE-SIMETH 200-200-20 MG/5ML PO SUSP
30.0000 mL | ORAL | Status: DC | PRN
  Administered 2011-04-18 (×2): 30 mL via ORAL

## 2011-04-15 MED ORDER — CHLORDIAZEPOXIDE HCL 25 MG PO CAPS
25.0000 mg | ORAL_CAPSULE | Freq: Four times a day (QID) | ORAL | Status: AC
  Administered 2011-04-15 (×4): 25 mg via ORAL
  Filled 2011-04-15 (×4): qty 1

## 2011-04-15 MED ORDER — ADULT MULTIVITAMIN W/MINERALS CH
1.0000 | ORAL_TABLET | Freq: Every day | ORAL | Status: DC
  Administered 2011-04-15 (×2): 1 via ORAL
  Filled 2011-04-15 (×2): qty 1

## 2011-04-15 MED ORDER — ONDANSETRON 4 MG PO TBDP: 4.0000 mg | ORAL_TABLET | Freq: Four times a day (QID) | ORAL | Status: AC | PRN

## 2011-04-15 MED ORDER — CHLORDIAZEPOXIDE HCL 25 MG PO CAPS
25.0000 mg | ORAL_CAPSULE | Freq: Three times a day (TID) | ORAL | Status: AC
  Administered 2011-04-16 (×3): 25 mg via ORAL
  Filled 2011-04-15 (×3): qty 1

## 2011-04-15 MED ORDER — CHLORDIAZEPOXIDE HCL 25 MG PO CAPS
25.0000 mg | ORAL_CAPSULE | Freq: Once | ORAL | Status: AC
  Administered 2011-04-15: 25 mg via ORAL
  Filled 2011-04-15: qty 1

## 2011-04-15 MED ORDER — VITAMIN B-1 100 MG PO TABS
100.0000 mg | ORAL_TABLET | Freq: Every day | ORAL | Status: DC
  Administered 2011-04-16 – 2011-04-21 (×6): 100 mg via ORAL
  Filled 2011-04-15 (×8): qty 1

## 2011-04-15 MED ORDER — PANTOPRAZOLE SODIUM 40 MG PO TBEC
40.0000 mg | DELAYED_RELEASE_TABLET | Freq: Every day | ORAL | Status: DC
  Administered 2011-04-15 – 2011-04-21 (×7): 40 mg via ORAL
  Filled 2011-04-15 (×8): qty 1

## 2011-04-15 MED ORDER — TRAZODONE HCL 50 MG PO TABS
50.0000 mg | ORAL_TABLET | Freq: Every evening | ORAL | Status: DC | PRN
  Administered 2011-04-15 – 2011-04-20 (×7): 50 mg via ORAL
  Filled 2011-04-15 (×17): qty 1

## 2011-04-15 MED ORDER — CHLORDIAZEPOXIDE HCL 25 MG PO CAPS
25.0000 mg | ORAL_CAPSULE | ORAL | Status: AC
  Administered 2011-04-17 (×2): 25 mg via ORAL
  Filled 2011-04-15 (×2): qty 1

## 2011-04-15 MED ORDER — THIAMINE HCL 100 MG/ML IJ SOLN
100.0000 mg | Freq: Once | INTRAMUSCULAR | Status: AC
  Administered 2011-04-15: 100 mg via INTRAMUSCULAR

## 2011-04-15 MED ORDER — HYDROXYZINE HCL 25 MG PO TABS: 25.0000 mg | ORAL_TABLET | Freq: Four times a day (QID) | ORAL | Status: AC | PRN

## 2011-04-15 MED ORDER — ADULT MULTIVITAMIN W/MINERALS CH
1.0000 | ORAL_TABLET | Freq: Every day | ORAL | Status: DC
  Administered 2011-04-16 – 2011-04-21 (×6): 1 via ORAL
  Filled 2011-04-15 (×9): qty 1

## 2011-04-15 MED ORDER — CHLORDIAZEPOXIDE HCL 25 MG PO CAPS: 25.0000 mg | ORAL_CAPSULE | Freq: Three times a day (TID) | ORAL | Status: DC | PRN

## 2011-04-15 NOTE — Progress Notes (Signed)
Adult Psychosocial Assessment Update Interdisciplinary Team  Previous Behavior Health Hospital admissions/discharges:  Admissions Discharges  Date: 02/09/2011 Date: 1/19/20113  Date: Date:  Date: Date:  Date: Date:  Date: Date:   Changes since the last Psychosocial Assessment (including adherence to outpatient mental health and/or substance abuse treatment, situational issues contributing to decompensation and/or relapse). Patient reports he recently started to have a "big pain" in his foot and began drinking beer  Patient stated he had not drank any alcohol since he last discharge in January of 2013,  Until he drank 8 beers last Tuesday. Patient stated his girlfriend and son brought him to  The hospital. Patient also reported he was unable to adhere to his last follow-up   Appointment because the appointment was schedule on a holiday and the offices were   Closed.   Discharge Plan 1. Will you be returning to the same living situation after discharge?   Yes: X No:      If no, what is your plan?    Patient reports he plans to return living with his girlfriend upon discharge.       2. Would you like a referral for services when you are discharged? Yes: X    If yes, for what services?  No:       [facility] and Recommendations (to be completed by the evaluator) Patient is a 36 year old male. Patient admitted with diagnosis of Alcohol Abuse and Mood  Disorder NOS. Patient reports relapsing into drinking alcohol since his last hospitalization  Patient would benefit from crisis stabilization, medication evaluation, psycho ed and  Group therapy, case management for discharge planning.                 Signature:  Wilmon Arms, 04/15/2011 3:05 PM

## 2011-04-15 NOTE — ED Notes (Signed)
Pt was IVC'd but the other side did not send it back here so the [facility] redid the papers and the police will bring them with them to take him to behavioral health.  They have been called and are on their way to the magistrates office and will bring papers with them and take pt to [facility].

## 2011-04-15 NOTE — Discharge Planning (Signed)
Met with pt and interpretor on this day. Pt was recently admitted to [facility] in January. Pt states that he came to the hospital because he feels sadness related to his decline in health, financial issues, and the death of his father years ago. Pt feels that his father's death is the main source of his sadness because he was unable to see his father before he died. Pt denies any substance abuse and reports that he has not had a drink of alcohol since he discharged in January until having one beer on Tuesday. Pt requested to transfer to the [facility] [address] because he does not feel like he is an alcoholic or that he has any issues with substance abuse. Pt states that he feels the groups on [facility] will help him to handle stressful situations. Pt was scheduled to follow up with [facility] [place] last admission but states that the agency was closed when he went for his appointment. Pt states that he is willing to pursue [facility] [place] for follow up for counseling services again.

## 2011-04-15 NOTE — Tx Team (Signed)
Initial Interdisciplinary Treatment Plan  PATIENT STRENGTHS: (choose at least two) Ability for insight Average or above average intelligence Capable of independent living General fund of knowledge Motivation for treatment/growth Physical Health Supportive family/friends Work skills  PATIENT STRESSORS: Financial difficulties Health problems Loss of Dad 8 yrs ago* Medication change or noncompliance   PROBLEM LIST: Problem List/Patient Goals Date to be addressed Date deferred Reason deferred Estimated date of resolution  "See if you can find me a place that I could go more for therapy" 04/15/11     "When I was here therapy did me well"            Increased risk for suicide 04/15/11     Depresssion 04/15/11                              DISCHARGE CRITERIA:  Ability to meet basic life and health needs Adequate post-discharge living arrangements Improved stabilization in mood, thinking, and/or behavior Medical problems require only outpatient monitoring Motivation to continue treatment in a less acute level of care Need for constant or close observation no longer present Reduction of life-threatening or endangering symptoms to within safe limits Verbal commitment to aftercare and medication compliance  PRELIMINARY DISCHARGE PLAN: Attend aftercare/continuing care group Outpatient therapy Participate in family therapy Return to previous work or school arrangements  PATIENT/FAMIILY INVOLVEMENT: This treatment plan has been presented to and reviewed with the patient, Mitchell [name], and/or family member.  The patient and family have been given the opportunity to ask questions and make suggestions.  Mitchell [name] 04/15/2011, 2:09 AM

## 2011-04-15 NOTE — Progress Notes (Addendum)
Patient and writer uncovered right foot to look at abraded area. There is a raw area on bottom size of half dollar. No drainage notes. Unable to complete self-inventory until interpreter arrives. Patient understands some English and is able to answer some questions. Given Librium as ordered and thiamine injection. Patient adamant about Thiamine injection being given in left deltoid. CIWA 7. Librium protocol started.

## 2011-04-15 NOTE — [facility] Suicide Risk Assessment (Signed)
Suicide Risk Assessment  Admission Assessment      Demographic factors:  See chart.  Current Mental Status: Patient seen and evaluated with interpreter. Chart reviewed. Patient stated that his mood was "down" because he stopped his medications. His affect was mood congruent and constricted. He denied any current thoughts of self injurious behavior, suicidal ideation or homicidal ideation. He contracted for safety on the unit and requested transfer to [address] where he "found peace and new skills" during his last admission.  There were no auditory or visual hallucinations, paranoia, delusional thought processes, or mania noted. Thought process was linear and goal directed. No psychomotor agitation or retardation was noted. His speech was normal rate, tone and volume. Eye contact was good. Judgment and insight are limited. Patient has been up and engaged on the unit. No acute safety concerns reported from team.   Pt is minimizing his alcohol intake.  Discussed binge drinking patterns.  Limited insight.  Loss Factors:  Financial problems / change in socioeconomic status; trouble supporting his family s/p reduction in work hours  Historical Factors: No hx on antidepressants prior to last admission; med noncompliance; past SI with attempts   Risk Reduction Factors:  Responsible for children under age 39 years of age;Sense of responsibility to family;Employed;Positive social support;Positive therapeutic relationship; willingness for Tx/meds  CLINICAL FACTORS: Depressive Disorder NOS; r/o Alcohol Abuse vs. Dependence (binge pattern)  COGNITIVE FEATURES THAT CONTRIBUTE TO RISK: none.  SUICIDE RISK: Pt viewed as a chronic increased risk of harm to self in light of his past hx and risk factors/recent attempt.  No acute safety concerns since on the unit.  Pt contracting for safety and requesting transfer to [address].  PLAN OF CARE: Pt admitted for crisis stabilization, detox and treatment.  Please see  orders.   Medications reviewed with pt and medication education provided.  Full librium taper most likely not necessary, but pt requesting continuation at this time.  Started by admitting provider.  Will continue q15 minute checks per unit protocol.  No clinical indication for one on one level of observation at this time.  Pt contracting for safety.  Mental health treatment, medication management and continued sobriety will mitigate against the increased risk of harm to self and/or others.  Discussed the importance of recovery with pt, as well as, tools to move forward in a healthy & safe manner.  Pt agreeable with the plan.  Discussed with the team. Accepted to 500 by Dr. Dan Humphreys.  Outpt meds restarted.  Kuroski-Mazzei, Kaytee Taliercio 04/15/2011, 1:50 PM

## 2011-04-15 NOTE — Progress Notes (Signed)
Patient ID: Mitchell [name], male   DOB: 13-Sep-1975, 36 y.o.   MRN: [id number] Pt was flat and depressed during the adm process. Became tearful when discussing a "loss". Stated that he regrets not seeing his dad for 20 yrs because he died 8 yrs ago.  Writer had the assistance of an interpretor and when he began discussing his dad, his nose began to bleed.  Pt has osteomyelitis in his right foot, etoh hepatitis, and etoh gastritis. Pt had thoughts of SI prior ER and according to the report attempted to hang himself with the call bell cord. Restrained while in the waiting area due to kicking. When asked about SI after arrival pt informed the writer that we helped him the last time he was here, and would come to staff with any problems. Pt complained of pain and insomnia and was given a now order for 800mg  ibuprofen and his scheduled trazodone. Support and encouragement was offered.

## 2011-04-15 NOTE — Progress Notes (Signed)
[facility] Group Notes:  (Counselor/Nursing/MHT/Case Management/Adjunct)  04/15/2011 12:59 PM  Type of Therapy:  Group Therapy  Participation Level:  Minimal  Participation Quality:  Appropriate and Attentive  Affect:  Appropriate  Cognitive:  Appropriate and Oriented  Insight:  Limited  Engagement in Group:  Limited  Engagement in Therapy:  Limited  Modes of Intervention:  Clarification, Education, Support and Exploration  Summary of Progress/Problems: Interpreter was present with patient during group therapy session. In discussing life and balance, patient reported he lacks affection. Patient provided limited participation in group discussion.   Wilmon Arms 04/15/2011, 12:59 PM
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
## 2011-04-16 NOTE — Progress Notes (Signed)
Talked with pt through use of interpreter.  Pt. Feels that he would feel better if he went to th [facility] hall.  Denies SI/HI and denies A/V hallucinations.  Contracts for safety.  Encouraged pt.. To elevate foot.  Support given.  Pt. Attended Karaoke.

## 2011-04-16 NOTE — Progress Notes (Signed)
Pt reports depression as a 7 with no si or hi. Gave medications and reported to PA to access rt foot. Pt going to groups with interpretor. Safety maintained on unit.

## 2011-04-16 NOTE — Treatment Plan (Signed)
Interdisciplinary Treatment Plan Update (Adult)  Date: 04/16/2011  Time Reviewed: 8:06 AM   Progress in Treatment: Attending groups: Yes Participating in groups: Yes Taking medication as prescribed: Yes Tolerating medication: Yes   Family/Significant other contact made: Wife Patient understands diagnosis:  Yes As evidenced by asking for help with depression,  Denies alcohol use/abuse Discussing patient identified problems/goals with staff:  Yes See below Medical problems stabilized or resolved:  Yes Denies suicidal/homicidal ideation: No intermittent Issues/concerns per patient self-inventory:  Not filled out Other:  New problem(s) identified: N/A  Reason for Continuation of Hospitalization: Depression Medication stabilization Withdrawal symptoms  Interventions implemented related to continuation of hospitalization: Medication stabilization,  Encourage group attendance and participation  Additional comments:  Estimated length of stay:2-3 days  Discharge Plan:Return home, follow up outpt New goal(s): N/A  Review of initial/current patient goals per problem list:   1.  Goal(s):Safely detox from alcohol  Met:  No  Target date:3/24  As evidenced by:no withdrawal symptoms, stable vitals  2.  Goal (s):Eliminate SI  Met:  No  Target date:3/25  As evidenced [id number] report  3.  Goal(s):Decrease depression  Met:  No  Target date:3/25  As evidenced [id number] will report depression at a manageable level   4.  Goal(s):  Met:  No  Target date:  As evidenced by:  Attendees: Patient:    Family:     Physician:  Lupe Carney 04/16/2011 8:06 AM   Nursing:    04/16/2011 8:06 AM   Case Manager:  Richelle Ito, LCSW 04/16/2011 8:06 AM   Counselor:   04/16/2011 8:06 AM   Other:     Other:     Other:     Other:      Scribe for Treatment Team:   Ida Rogue, 04/16/2011 8:06 AM

## 2011-04-16 NOTE — Progress Notes (Signed)
[facility] Group Notes:  (Counselor/Nursing/MHT/Case Management/Adjunct)  04/15/11 1:08 PM  Type of Therapy:  Group Therapy  Participation Level:  Active  Participation Quality:  Attentive and Sharing  Affect:  Depressed and Flat  Cognitive:  Oriented  Insight:  Limited  Engagement in Group:  Good  Modes of Intervention:  Activity, Clarification, Socialization and Support  Summary of Progress/Problems: [place], with help of interpreter, was attentive and participated in group activity. He chose two photos to represent balance. A photo of a family was chosen to represent a life of balance "as family is everything to me" They are my balance.    [name] 04/16/2011, 1:08 PM

## 2011-04-16 NOTE — Progress Notes (Signed)
Pt laying in bed resting with eyes closed. Respirations even and unlabored. No distress noted.  

## 2011-04-16 NOTE — Discharge Planning (Signed)
Pt present with interpretor during morning group. Pt states that he is doing well today and still hopeful about moving to the 500 hall which he feels will be more beneficial for him. Pt will follow up with [facility] for meds after d/c.

## 2011-04-16 NOTE — Progress Notes (Signed)
Patient ID: Mitchell [name], male   DOB: [birth year], 36 y.o.   MRN: [id number] Pt. Interpreter present [facility]). Pt. Went to group reports "good".  Pt. Reports depression at "6". Writer review meds, pt. Familiar with meds. Pt. Pleasant, jokes. Denies SHI. Staff will monitor q20min for safety. Pt. Remains safe on the unit.

## 2011-04-17 NOTE — Progress Notes (Signed)
04/17/2011 Nursing D 1900 D Pt is seen OOB UAL on the [facility] today..he is assisted with communication this AM via interpreter. HE makes good eye contact. HE denies feeling suicidal. HE attends his groups, denies withdrawal symptoms and is compliant with his meds. A He completed his self inventory sheet and on it he wrote he rated his feelings of depression and hopelessnesss " 7 / 4 " and that he denied SI. R Safety is maintained and POC includes fostering therapeutic relationship already established PD RN [facility]

## 2011-04-17 NOTE — Progress Notes (Signed)
[facility] Group Notes:  (Counselor/Nursing/MHT/Case Management/Adjunct)  04/17/2011 2:34 PM  Type of Therapy:  Group Therapy  Participation Level:  Did Not Attend   Neila Gear 04/17/2011, 2:34 PM

## 2011-04-17 NOTE — Progress Notes (Signed)
Patient ID: Mitchell [name], male   DOB: 06-02-1975, 36 y.o.   MRN: [id number] Pt is walking with boot on infected foot.  He says he is not depressed.  His stay here has been good.  He is able to work.  He has food for his sons.  He tries to express self with [place] and [place].  His affect is full, he has good eye contact and indicates he feels better.  Knowing him from pre-admission at one of the medical services. His mood is much improved.

## 2011-04-17 NOTE — Progress Notes (Signed)
[facility] Group Notes:  (Counselor/Nursing/MHT/Case Management/Adjunct)  04/17/2011 10:03 AM  Type of Therapy:  After Care Planning group  Pt. attended after care planning group and was given [place] Suicide Prevention  Information and crisis and hotline numbers to call if needed. Pt.'s in the group agreed to use and call them if needed. Pt. states he is okay this morning and rates his energy level at a 7. Pt. had a interpreter and was asked if he had any SI or HI and the pt. said no. Pt. told therapist that he came i on Wednesday due to going to [facility] about problem with foot and then the doctor smelled alcohol on him. Pt. stated that due to past SI history that they sent him to [facility].    Mitchell [name] 04/17/2011, 10:03 AM

## 2011-04-18 NOTE — Progress Notes (Signed)
Patient ID: Mitchell [name], male   DOB: October 03, 1975, 36 y.o.   MRN: [id number]  Pt. attended and participated in aftercare planning group. Pt denied S/I and H/I. Pt. accepted information on suicide prevention, warning signs to look for with suicide and crisis line numbers to use. The pt. agreed to call crisis line numbers if having warning signs or having thoughts of suicide. Pt. listed their current anxiety level as 5 and depression as a 3. Pt has no other case management needs at this time.

## 2011-04-18 NOTE — Progress Notes (Signed)
Patient ID: Mitchell [name], male   DOB: 1975/08/31, 36 y.o.   MRN: [id number] Pt. In hall with family, smiling. Pt. Denies SHI. Pt. Reports indigestion, writer to assess meds for availability. Pt. Still having depression as "4" of 10. Pt.will be monitored q82min for safety.

## 2011-04-18 NOTE — Progress Notes (Signed)
Pt observed resting in bed with eyes closed. Audible snoring can be heard. RR WNL, even and unlabored. Level III obs maintained for safety. Pt remains safe. Lawrence [place]

## 2011-04-18 NOTE — Progress Notes (Signed)
04/18/2011  Nursing D 1900 Mitchell [name] is seen [facility] U[facility] on the [facility] t0day...tolerated well. HE is seen laughing and joking with the staff today...more and more. He makes good eye contact. He is engaged in his POC. He attended his groups as planned. He is compliant with his meds... A He completed his self inventory  And on it he wrote he denied SI, he rated his feelings of depression and hopelessnesss " 4  / 5 " and stated his DC plans include contin to take his meds , following up with therapist and psychiatrist. R Safety is amintaiend and POC  In place PD RN [facility]

## 2011-04-18 NOTE — Progress Notes (Signed)
[facility] Group Notes:  (Counselor/Nursing/MHT/Case Management/Adjunct)  04/18/2011 3:24 PM  Type of Therapy:  Group Therapy  Participation Level:  Active  Participation Quality:  Appropriate, Attentive, Sharing and Supportive  Affect:  Appropriate  Cognitive:  Appropriate  Insight:  Good  Engagement in Group:  Good  Engagement in Therapy:  Good  Modes of Intervention:  Clarification, Socialization and Support  Summary of Progress/Problems: Pt. participated in a support group on supports  and how to find supports when they are not available in their life. The  group discussed healthy and unhealthy support and were encouraged by therapist leading the group to seek healthy and multiple forms of support that can aid in their recovery. Pt. stated that his wife and his children are his support. Pt. Spoke about his supports(wife) being there for him when his own family was not there for them . Pt. Was very appreciative of his supports and stated that his wife know him very well.  Lamar Blinks [place] 04/18/2011, 3:24 PM

## 2011-04-18 NOTE — Progress Notes (Signed)
  Mitchell [name] is a 36 y.o. male [id number] 05/05/1975  04/15/2011 Principal Problem:  *Substance induced mood disorder   Mental Status: Has been active in [place]. Not actively suicidal according to staff.    Subjective/Objective: Sound asleep and interpreter not available.     Filed Vitals:   04/18/11 0603  BP: 117/71  Pulse: 83  Temp:   Resp:     Lab Results:   BMET    Component Value Date/Time   NA 140 04/13/2011 2035   K 3.4* 04/13/2011 2035   CL 106 04/13/2011 2035   CO2 26 04/13/2011 2035   GLUCOSE 199* 04/13/2011 2035   BUN 6 04/13/2011 2035   CREATININE 0.57 04/13/2011 2035   CALCIUM 8.3* 04/13/2011 2035   GFRNONAA >90 04/13/2011 2035   GFRAA >90 04/13/2011 2035    Medications:  Scheduled:     . chlordiazePOXIDE  25 mg Oral BH-qamhs   Followed by  . chlordiazePOXIDE  25 mg Oral Daily  . citalopram  20 mg Oral QHS  . ibuprofen  600 mg Oral TID WC & HS  . mulitivitamin with minerals  1 tablet Oral Daily  . pantoprazole  40 mg Oral Q1200  . thiamine  100 mg Oral Daily  . traZODone  50 mg Oral QHS,MR X 1     PRN Meds alum & mag hydroxide-simeth, chlordiazePOXIDE, chlordiazePOXIDE, hydrOXYzine, loperamide, magnesium hydroxide, ondansetron  Plan : Continue current plan of care   Danya Spearman,MICKIE D. 04/18/2011

## 2011-04-19 DIAGNOSIS — F1020 Alcohol dependence, uncomplicated: Principal | ICD-10-CM

## 2011-04-19 MED ORDER — INSULIN ASPART 100 UNIT/ML ~~LOC~~ SOLN
3.0000 [IU] | Freq: Three times a day (TID) | SUBCUTANEOUS | Status: DC
  Administered 2011-04-19 – 2011-04-21 (×7): 3 [IU] via SUBCUTANEOUS

## 2011-04-19 MED ORDER — METFORMIN HCL 500 MG PO TABS
500.0000 mg | ORAL_TABLET | Freq: Once | ORAL | Status: AC
  Administered 2011-04-19: 500 mg via ORAL
  Filled 2011-04-19: qty 1

## 2011-04-19 MED ORDER — INSULIN ASPART 100 UNIT/ML ~~LOC~~ SOLN
0.0000 [IU] | Freq: Three times a day (TID) | SUBCUTANEOUS | Status: DC
  Administered 2011-04-19: 15 [IU] via SUBCUTANEOUS
  Administered 2011-04-20: 5 [IU] via SUBCUTANEOUS
  Administered 2011-04-20 (×2): 8 [IU] via SUBCUTANEOUS
  Administered 2011-04-21: 5 [IU] via SUBCUTANEOUS
  Administered 2011-04-21: 15 [IU] via SUBCUTANEOUS

## 2011-04-19 MED ORDER — METFORMIN HCL 500 MG PO TABS
500.0000 mg | ORAL_TABLET | Freq: Two times a day (BID) | ORAL | Status: DC
  Administered 2011-04-19 – 2011-04-21 (×4): 500 mg via ORAL
  Filled 2011-04-19 (×9): qty 1

## 2011-04-19 NOTE — Progress Notes (Signed)
Pt riding around in a wheel chair and stating that he is having pain. States it's less now that he is using the wheelchair. Given support, reassurance and praise.

## 2011-04-19 NOTE — Progress Notes (Signed)
Recreation Therapy Group Note  Date: 04/19/2011        Time: 1145       Group Topic/Focus: Patient invited to participate in animal assisted therapy. Pets as a coping skill and responsibility were discussed.   Participation Level: Active  Participation Quality: Appropriate and Attentive  Affect: Appropriate  Cognitive: Appropriate and Oriented   Additional Comments: None   

## 2011-04-19 NOTE — Progress Notes (Signed)
Patient ID: Mitchell [name], male   DOB: 07/14/1975, 36 y.o.   MRN: [id number] Pt. With interrupter [name]), expresses concern about blood sugar. Writer explained to them that often time an injury or infectious process can alter blood sugar levels. Pt. Wondering why blood work taken on last week was not indicative. Writer told pt. The levels were probably not up at this time. Pt. Nods in agreement. Writer told pt. Physician will schedule blood work as needed and keep him abreast of the results. Staff will monitor q73min for safety. Pt. Remains safety on the unit.

## 2011-04-19 NOTE — Progress Notes (Signed)
[facility] MD Progress Note  04/19/2011 7:32 PM  Diagnosis:  Axis I: Substance Induced Mood Disorder  ADL's:  Intact  Sleep: Fair  Appetite:  Fair  Suicidal Ideation:  Pt denies any suicidal thoughts Homicidal Ideation:  Denies adamantly any homicidal thoughts.  Mental Status Examination/Evaluation: Objective:  Appearance: Casual  Eye Contact::  Good  Speech:  Broken English  Volume:  Normal  Mood:  All better Anxiety: All better  Affect:  Congruent  Thought Process:  Coherent  Orientation:  Full  Thought Content:  WDL  Suicidal Thoughts:  No  Homicidal Thoughts:  No  Memory:  Immediate;   Good  Judgement:  Impaired  Insight:  Lacking  Psychomotor Activity:  Normal  Concentration:  Fair  Recall:  Fair  Akathisia:  No  AIMS (if indicated):     Assets:  Communication Skills Desire for Improvement Housing Intimacy Social Support  Sleep:  Number of Hours: 6.5    Vital Signs:Blood pressure 118/79, pulse 73, temperature 97.6 F (36.4 C), temperature source Oral, resp. rate 18, height 5' 1.25" (1.556 m), weight 71.668 kg (158 lb). Current Medications: Current Facility-Administered Medications  Medication Dose Route Frequency Provider Last Rate Last Dose  . alum & mag hydroxide-simeth (MAALOX/MYLANTA) 200-200-20 MG/5ML suspension 30 mL  30 mL Oral Q4H PRN Jorje Guild, PA-C   30 mL at 04/18/11 2016  . chlordiazePOXIDE (LIBRIUM) capsule 25 mg  25 mg Oral Q8H PRN Jorje Guild, PA-C      . citalopram (CELEXA) tablet 20 mg  20 mg Oral QHS Jorje Guild, PA-C   20 mg at 04/18/11 2200  . ibuprofen (ADVIL,MOTRIN) tablet 600 mg  600 mg Oral TID WC & HS Jorje Guild, PA-C   600 mg at 04/19/11 1722  . insulin aspart (novoLOG) injection 0-15 Units  0-15 Units Subcutaneous TID WC Sanjuana Kava, NP   15 Units at 04/19/11 1722  . insulin aspart (novoLOG) injection 3 Units  3 Units Subcutaneous TID WC Sanjuana Kava, NP   3 Units at 04/19/11 1723  . magnesium hydroxide (MILK OF MAGNESIA) suspension 30 mL   30 mL Oral Daily PRN Jorje Guild, PA-C      . metFORMIN (GLUCOPHAGE) tablet 500 mg  500 mg Oral BID WC Sanjuana Kava, NP   500 mg at 04/19/11 1722  . metFORMIN (GLUCOPHAGE) tablet 500 mg  500 mg Oral Once [employer], DO   500 mg at 04/19/11 1254  . mulitivitamin with minerals tablet 1 tablet  1 tablet Oral Daily Sanjuana Kava, NP   1 tablet at 04/19/11 0845  . pantoprazole (PROTONIX) EC tablet 40 mg  40 mg Oral Q1200 Jorje Guild, PA-C   40 mg at 04/19/11 1200  . thiamine (VITAMIN B-1) tablet 100 mg  100 mg Oral Daily Sanjuana Kava, NP   100 mg at 04/19/11 0845  . traZODone (DESYREL) tablet 50 mg  50 mg Oral QHS,MR X 1 Jorje Guild, PA-C   50 mg at 04/18/11 2200    Lab Results:  Results for orders placed during the hospital encounter of 04/15/11 (from the past 48 hour(s))  GLUCOSE, RANDOM     Status: Abnormal   Collection Time   04/19/11  6:15 AM      Component Value Range Comment   Glucose, Bld 274 (*) 70 - 99 (mg/dL)   GLUCOSE, CAPILLARY     Status: Abnormal   Collection Time   04/19/11  4:50 PM  Component Value Range Comment   Glucose-Capillary 372 (*) 70 - 99 (mg/dL)     Physical Findings: AIMS:  , ,  ,  ,    CIWA:  CIWA-Ar Total: 0  COWS:     Treatment Plan Summary: Daily contact with patient to assess and evaluate symptoms and progress in treatment Medication management  Plan: Pt states that he has learned some things since coming back into the hospital.  He asks to be discharged tomorrow.  Will need a translator to help with that decision.  Mitchell [name] 04/19/2011, 7:32 PM

## 2011-04-19 NOTE — Discharge Planning (Signed)
Met with Mitchell [name] and Mitchell [name], interpreter, inidividually.  Mitchell [name] states he is doing much better and is ready for d/c tomorrow.  He explained the last time he left [facility] was closed 2 days in a row, and after that he quit trying to get in.  Assured him I would call today to get a follow up appointment with a Spanish speaking therapist if one is available.  Asked him about his plan for sobriety.  He has none.  His response was to say that his family is his support, and that he does not want to be a bad example for his son, but also related that he drinks due to on-going medical issues with foot, and I pointed out that is not resolved.  States he needs a crises number so that he can call for help instead of drinking.  Gave him number for Mobile Crises.  Denied SI.  States his wife and son came to visit this weekend and that went very well.  Limited insight.  Will set up for outpt services.

## 2011-04-20 LAB — GLUCOSE, CAPILLARY: Glucose-Capillary: 291 mg/dL — ABNORMAL HIGH (ref 70–99)

## 2011-04-20 NOTE — Progress Notes (Signed)
[facility] Adult Inpatient Family/Significant Other Suicide Prevention Education  Suicide Prevention Education:  Education Completed; Mitchell [name] at [phone]  was identified by the patient as the family member/significant other with whom the patient will be residing, and identified as the person(s) who will aid the patient in the event of a mental health crisis (suicidal ideations/suicide attempt).  With written consent from the patient, the family member/significant other has been provided the following suicide prevention education, prior to the discharge of the patient with the aid of the interpreter provided on the unit. The conversation took place on April 15, 2011 immediately before interpreter's afternoon break.   The suicide prevention education provided includes the following:  Suicide risk factors  Suicide prevention and interventions  National Suicide Hotline telephone number  [facility] assessment telephone number  [facility] Emergency Assistance 911  [facility] and/or Residential Mobile Crisis Unit telephone number  Request made of family/significant other to:  Remove weapons (e.g., guns, rifles, knives), all items previously/currently identified as safety concern.    Remove drugs/medications (over-the-counter, prescriptions, illicit drugs), all items previously/currently identified as a safety concern.  The family member/significant other verbalizes understanding of the suicide prevention education information provided.  The family member/significant other agrees to remove the items of safety concern listed above.  Mitchell [name] 04/20/2011, 12:16 PM
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
## 2011-04-20 NOTE — Progress Notes (Signed)
[facility] Group Notes: (Counselor/Nursing/MHT/Case Management/Adjunct) 04/20/2011   @ 11:00 Feelings About Diagnosis  Type of Therapy:  Group Therapy  Participation Level:  Minimal  Participation Quality:  Attentive   Affect:  Appropriate  Cognitive:  Appropriate  Insight:  None  Engagement in Group: Limited  Engagement in Therapy:  Limited  Modes of Intervention:  Support and Exploration  Summary of Progress/Problems: Monty  was attentive but not engaged in group process     [name] 04/20/2011  3:44 PM

## 2011-04-20 NOTE — Progress Notes (Signed)
[facility] MD Progress Note  04/20/2011 9:03 PM  Diagnosis:  Axis I: Substance Induced Mood Disorder  Pt is concerned about his insulin.  He forgot that he has gotten it after lunch.  In a lengthy discussion with the translator, he confessed that he had never given his addiction much thought.  He was challenged to consider it similar to dealing with gravity.  Addiction and gravity take just as much time off each day - NONE. And that it took years to develop strength, reflexes, and responses to gravity.  It will take the same thing - years to develop strength, reflexes, and responses to his addiction.  He confessed that he had gone to a Spanish speaking AA mtg in [place].  I printed out for him some information from the [employer] in [place].  He was appreciative of that.   ADL's:  Intact  Sleep: Fair  Appetite:  Fair  Suicidal Ideation:  Pt denies any suicidal thoughts Homicidal Ideation:  Denies adamantly any homicidal thoughts.  Mental Status Examination/Evaluation: Objective:  Appearance: Casual  Eye Contact::  Good  Speech:  Broken English  Volume:  Normal  Mood:  7/10 on a scale of 1 is the best and 10 is the worst. Anxiety: 5/10 on the same scale.  Affect:  Congruent  Thought Process:  Coherent  Orientation:  Full  Thought Content:  WDL  Suicidal Thoughts:  No  Homicidal Thoughts:  No  Memory:  Immediate;   Good  Judgement:  Impaired  Insight:  Lacking  Psychomotor Activity:  Normal  Concentration:  Fair  Recall:  Fair  Akathisia:  No  AIMS (if indicated):     Assets:  Communication Skills Desire for Improvement Housing Intimacy Social Support  Sleep:  Number of Hours: 6    Vital Signs:Blood pressure 103/71, pulse 67, temperature 97.6 F (36.4 C), temperature source Oral, resp. rate 18, height 5' 1.25" (1.556 m), weight 71.668 kg (158 lb). Current Medications: Current Facility-Administered Medications  Medication Dose Route Frequency Provider Last Rate Last Dose  .  alum & mag hydroxide-simeth (MAALOX/MYLANTA) 200-200-20 MG/5ML suspension 30 mL  30 mL Oral Q4H PRN Jorje Guild, PA-C   30 mL at 04/18/11 2016  . chlordiazePOXIDE (LIBRIUM) capsule 25 mg  25 mg Oral Q8H PRN Jorje Guild, PA-C      . citalopram (CELEXA) tablet 20 mg  20 mg Oral QHS Jorje Guild, PA-C   20 mg at 04/19/11 2140  . ibuprofen (ADVIL,MOTRIN) tablet 600 mg  600 mg Oral TID WC & HS Jorje Guild, PA-C   600 mg at 04/20/11 1644  . insulin aspart (novoLOG) injection 0-15 Units  0-15 Units Subcutaneous TID WC Sanjuana Kava, NP   8 Units at 04/20/11 1717  . insulin aspart (novoLOG) injection 3 Units  3 Units Subcutaneous TID WC Sanjuana Kava, NP   3 Units at 04/20/11 1716  . magnesium hydroxide (MILK OF MAGNESIA) suspension 30 mL  30 mL Oral Daily PRN Jorje Guild, PA-C      . metFORMIN (GLUCOPHAGE) tablet 500 mg  500 mg Oral BID WC Sanjuana Kava, NP   500 mg at 04/20/11 1644  . mulitivitamin with minerals tablet 1 tablet  1 tablet Oral Daily Sanjuana Kava, NP   1 tablet at 04/20/11 [phone]  . pantoprazole (PROTONIX) EC tablet 40 mg  40 mg Oral Q1200 Jorje Guild, PA-C   40 mg at 04/20/11 1211  . thiamine (VITAMIN B-1) tablet 100 mg  100 mg Oral Daily Sanjuana Kava, NP   100 mg at 04/20/11 4098  . traZODone (DESYREL) tablet 50 mg  50 mg Oral QHS,MR X 1 Jorje Guild, PA-C   50 mg at 04/19/11 2140    Lab Results:  Results for orders placed during the hospital encounter of 04/15/11 (from the past 48 hour(s))  GLUCOSE, RANDOM     Status: Abnormal   Collection Time   04/19/11  6:15 AM      Component Value Range Comment   Glucose, Bld 274 (*) 70 - 99 (mg/dL)   GLUCOSE, CAPILLARY     Status: Abnormal   Collection Time   04/19/11  4:50 PM      Component Value Range Comment   Glucose-Capillary 372 (*) 70 - 99 (mg/dL)   GLUCOSE, CAPILLARY     Status: Abnormal   Collection Time   04/20/11  1:02 AM      Component Value Range Comment   Glucose-Capillary 254 (*) 70 - 99 (mg/dL)    Comment 1 Notify RN      Comment 2  Documented in Chart     GLUCOSE, CAPILLARY     Status: Abnormal   Collection Time   04/20/11  5:53 AM      Component Value Range Comment   Glucose-Capillary 249 (*) 70 - 99 (mg/dL)    Comment 1 Notify RN     GLUCOSE, CAPILLARY     Status: Abnormal   Collection Time   04/20/11 11:58 AM      Component Value Range Comment   Glucose-Capillary 291 (*) 70 - 99 (mg/dL)   GLUCOSE, CAPILLARY     Status: Abnormal   Collection Time   04/20/11  4:55 PM      Component Value Range Comment   Glucose-Capillary 270 (*) 70 - 99 (mg/dL)     Physical Findings: AIMS:  , ,  ,  ,    CIWA:  CIWA-Ar Total: 0  COWS:     Treatment Plan Summary: Daily contact with patient to assess and evaluate symptoms and progress in treatment Medication management  Plan: Consider discharge tomorrow.  Miller Limehouse 04/20/2011, 9:03 PM

## 2011-04-20 NOTE — Progress Notes (Signed)
[facility] Group Notes: (Counselor/Nursing/MHT/Case Management/Adjunct) 04/20/2011   @1 :15pm Relaxation Techniques  Type of Therapy:  Group Therapy  Participation Level:  Good  Participation Quality:  Attentive, Appropriate  Affect:  Appropriate  Cognitive:  Appropriate  Insight:  Good  Engagement in Group:  Good   Engagement in Therapy:  Good  Modes of Intervention:  Support and Exploration  Summary of Progress/Problems: Sherard participated in focused breathing and progressive muscle relaxation exercises. He stated that the exercise was very helpful and requested a copy of the cd used .

## 2011-04-20 NOTE — Progress Notes (Addendum)
Pt presents calm and cooperative this evening. Pt presents with no concerns he wishes for this writer to address. Pt admits to some pain and nods with writer that his motrin at bedtime will be sufficient. Pt is denying any SI at this time. Pt also denies having any depression at this time. Pt still uses wheelchair to ambulate in halls. Continued support and availability has been extended to this pt. Pt safety remains with q10min checks. Pt has asked this writer to print off information on a diet to help manage his blood glucose levels. Pt was not sure why he was given insulin. Pt was educated on his trending high cbg's and hga1c. Pt was informed of normal limits for his blood glucose levels and hga1c. Writer verbalized understanding of information with the aid of an interpreter to relay the information to him.

## 2011-04-20 NOTE — Progress Notes (Signed)
Grief and loss group  Facilitated grief and loss group on [address] [facility]. Discussed group rules and limits of privacy/confidentiality. Discussed types of loss and normal grief reactions. Invited group members to share experiences w/ grief; topics focused on death/dying and interpersonal losses. Group was initially quiet but progressively engaged, was supportive of one another.   Pt presented to group w/ interpreter. Pt was active and engaged in group, pt stated that he immigrated to [place] 8y ago and has not been able to see father since he left; stated that father died few yrs ago and he has since dealt w/ guilt over not seeing him and making peace w/ father (stated that father was angry that he had left his country). Pt stated that his younger brother always reminds him that he should have made peace w/ father. Pt received support and feedback from group that he may be able to find peace by living his life in way that will honor father's memory. Pt stated that he felt supported by group and found feedback helpful.  Darlyne Schmiesing B MS, LPCA, NCC

## 2011-04-21 DIAGNOSIS — F1994 Other psychoactive substance use, unspecified with psychoactive substance-induced mood disorder: Secondary | ICD-10-CM

## 2011-04-21 LAB — GLUCOSE, CAPILLARY: Glucose-Capillary: 399 mg/dL — ABNORMAL HIGH (ref 70–99)

## 2011-04-21 MED ORDER — ADULT MULTIVITAMIN W/MINERALS CH: 1.0000 | ORAL_TABLET | Freq: Every day | ORAL | Status: DC

## 2011-04-21 MED ORDER — CITALOPRAM HYDROBROMIDE 20 MG PO TABS: 20.0000 mg | ORAL_TABLET | Freq: Every day | ORAL | Status: DC

## 2011-04-21 MED ORDER — PANTOPRAZOLE SODIUM 40 MG PO TBEC: 40.0000 mg | DELAYED_RELEASE_TABLET | Freq: Every day | ORAL | Status: DC

## 2011-04-21 MED ORDER — FOLIC ACID 1 MG PO TABS: 1.0000 mg | ORAL_TABLET | Freq: Every day | ORAL | Status: DC

## 2011-04-21 MED ORDER — METFORMIN HCL 500 MG PO TABS: 500.0000 mg | ORAL_TABLET | Freq: Two times a day (BID) | ORAL | Status: DC

## 2011-04-21 MED ORDER — MAGNESIUM OXIDE 400 MG PO TABS: 400.0000 mg | ORAL_TABLET | Freq: Two times a day (BID) | ORAL | Status: DC

## 2011-04-21 MED ORDER — INSULIN ASPART 100 UNIT/ML ~~LOC~~ SOLN: 3.0000 [IU] | Freq: Three times a day (TID) | SUBCUTANEOUS | Status: DC

## 2011-04-21 MED ORDER — IBUPROFEN 600 MG PO TABS: 600.0000 mg | ORAL_TABLET | Freq: Three times a day (TID) | ORAL | Status: AC

## 2011-04-21 MED ORDER — FOLIC ACID 1 MG PO TABS
1.0000 mg | ORAL_TABLET | Freq: Every day | ORAL | Status: DC
  Filled 2011-04-21 (×2): qty 7

## 2011-04-21 MED ORDER — MAGNESIUM OXIDE 400 MG PO TABS
400.0000 mg | ORAL_TABLET | Freq: Two times a day (BID) | ORAL | Status: DC
  Filled 2011-04-21 (×2): qty 6

## 2011-04-21 MED ORDER — TRAZODONE HCL 50 MG PO TABS: 50.0000 mg | ORAL_TABLET | Freq: Every evening | ORAL | Status: DC | PRN

## 2011-04-21 NOTE — Progress Notes (Signed)
[facility] Group Notes:  (Counselor/Nursing/MHT/Case Management/Adjunct)  04/21/2011 12:00 PM  Type of Therapy:  Group Therapy  Participation Level:  Minimal  Participation Quality:  Drowsy  Affect:  Appropriate  Cognitive:  Appropriate  Insight:  Limited  Engagement in Group:  Limited  Engagement in Therapy:  Limited  Modes of Intervention:  Support  Summary of Progress/Problems:Attended group but appeared to sleep through much of it.  [place], Vista Sawatzky 04/21/2011, 12:00 PM

## 2011-04-21 NOTE — Progress Notes (Signed)
04/21/2011         Time: 1415      Group Topic/Focus: The focus of this group is on understanding the role anger plays in guiding behavior and ways to manage that anger in order to solve problems.  Participation Level: Active  Participation Quality: Appropriate   Affect: Appropriate  Cognitive: Oriented   Additional Comments: Patient joking with peers, says he is looking forward to discharge today. Patient reports one of his triggers is when people act like they are perfect and try and tell him what to do.   Malcom Selmer 04/21/2011 3:06 PM

## 2011-04-21 NOTE — Tx Team (Signed)
Interdisciplinary Treatment Plan Update (Adult)  Date:  04/21/2011  Time Reviewed:  10:17 AM   Progress in Treatment: Attending groups: Yes Participating in groups:  Yes Taking medication as prescribed:  Yes Tolerating medication: Yes Family/Significant othe contact made:  Yes, contact made with: wife, [name] Patient understands diagnosis: Yes Discussing patient identified problems/goals with staff:  Yes Medical problems stabilized or resolved: Yes Denies suicidal/homicidal ideation: Yes Issues/concerns per patient self-inventory:  No  Other:  New problem(s) identified: None  Reason for Continuation of Hospitalization: Appropriate for discharge today  Interventions implemented related to continuation of hospitalization:  Medication stabilization, safety checks q 15 mins, group attendance  Additional comments:  Estimated length of stay: discharge today  Discharge Plan:  Mitchell [name] will discharge home and will follow up with [facility]  New goal(s):  Review of initial/current patient goals per problem list:   1. Goal(s):Safely detox from alcohol  Met: Yes Target date:3/24  As evidenced by: Mitchell [name] has successfully completed detox protocol\   2. Goal (s):Eliminate SI  Met: Yes Target date:3/25  As evidenced by: Mitchell [name] reports no suicidal thoughts   3. Goal(s):Decrease depression  Met: Yes Target date:3/25  As evidenced [id number] rates depression level at 0 today   Attendees: Patient:  Mitchell [name] 04/21/2011 10:17 AM  Family:     Physician:  Dr Orson Aloe, MD 04/21/2011 10:17 AM  Nursing:   Omelia Blackwater, RN 04/21/2011 10:17 AM  Case Manager:  Juline Patch, LCSW 04/21/2011 10:17 AM  Counselor:  Angus Palms, LCSW 04/21/2011 10:17 AM  Other:  Reyes Ivan, LCSWA 04/21/2011 10:17 AM  Other:  Manuela Schwartz, RN 04/21/2011 10:17 AM  Other:  Charlyne Mom, doctoral psych intern 04/21/2011 10:17 AM  Other:      Scribe for Treatment Team:     [name], 04/21/2011 10:17 AM

## 2011-04-21 NOTE — Discharge Summary (Signed)
I agree with this D/C Summary.  
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
## 2011-04-21 NOTE — Discharge Instructions (Signed)
Attend 90 meetings in 90 days. Get trusted sponsor from the advise of others or from whomever in meetings seems to make sense, has a proven track record, will hold you responsible for your sobriety, and both expects and insists on total abstinence.  Work the steps HONESTLY with the trusted sponsor. Get obsessed with your recovery by often reminding yourself of how DEADLY this dredged horrible disease of addiction JUST IS. Focus the first month on speaker meetings where you specifically look at how your life has been wrecked by drugs/alcohol and how your life has been similar to that of the speakers.   

## 2011-04-21 NOTE — Progress Notes (Signed)
Pt D/C home. Pt rx and follow-up visits reviewed and pt verbalized understanding. Pt denies SI/HI. Pt belongings were returned.

## 2011-04-21 NOTE — Discharge Summary (Signed)
Physician Discharge Summary Note  Patient:  Mitchell [name] is an 36 y.o., male MRN:  [id number] DOB:  Jan 14, 1976 Patient phone:  [phone] (home)  Patient address:   [address] [place] [address],   Date of Admission:  04/15/2011 Date of Discharge: 04/21/11  Reason for Admission: Suicidal ideation with plan to stab self  Discharge Diagnoses: Principal Problem:  *Substance induced mood disorder   Axis Diagnosis:   AXIS I:  Substance Induced Mood Disorder AXIS II:  Deferred AXIS III:   Past Medical History  Diagnosis Date  . Burn   . GERD (gastroesophageal reflux disease)   . Alcohol abuse   . Foot osteomyelitis, right   . Alcoholic hepatitis   . Alcoholic gastritis   . Attempted suicide 02/06/2011  . Mental disorder    AXIS IV:  economic problems, educational problems, housing problems, occupational problems, other psychosocial or environmental problems and problems related to social environment AXIS V:  68  Level of Care:  OP  Hospital Course: This assessment was conducted using a Hispanic interpreter [name] from the language resource. This is a 36 year old Hispanic male, admitted to [facility] from the [facility] ED with complaints of suicidal ideation with plans to stab self. While at the ED, report indicated that [name] attempted to hung himself using a call light cord. Patient was recently a patient here at [facility] for alcohol detox. Today, patient reports, "I went to see my regular doctor the other day, my doctor started to ask me some questions about suicide. I admitted thinking about suicide for the past 15 days. The reason for my thinking this way is because of my Rt. Foot infection. I worry and panic all the time because my foot is not getting better. It got infection to it and the infection has reached my bone. My foot hurts all the time".  While a patient in this hospital, [name] was started on Librium protocol to help with alcohol  withdrawal symptoms, Citalopram 20 mg for his mood disorder. He was also enrolled in group counseling and activities. A spanish interpreter from the Language resource was also provided to assist with his interaction with staff and his treatment team members. Patient received medication management and monitoring for his other medical conditions. Patient participated actively in group counseling and tolerated his treatment regimen without any significant side effects and or reactions.  Patient mode of mobility while in this hospital is via wheelchair. This is as a result of his Rt. Foot infection associated with osteomyelitis. He wore his Rt. Shoe brace whenever he is up and about. Patient met with treatment team this am with his interpreter present. He agreed with the team that he is ready for discharge. He denies any suicidal, homicidal ideations, auditory and or visual hallucinations. He denies any delusional thinking. He will continue psychiatric care on an outpatient basis at the family Services with Mitchell [name] a Hispanic speaking therapist. The address, date and time for this appointment provided for patient. Patient received 2 weeks worth supply samples of his discharge medications.  He is currently being discharged to his home with all personal belongings. Mr. Marlene [name] was also provided with information on a Hispanic speaking AA meetings to help him maintain sobriety. He left [facility] facility with all personal belongings in no apparent distress via family transport.  Consults:  None  Significant Diagnostic Studies:  None  Discharge Vitals:   Blood pressure 108/69, pulse 72, temperature 98  F (36.7 C), temperature source Oral, resp. rate 16, height 5' 1.25" (1.556 m), weight 71.668 kg (158 lb).  Mental Status Exam: See Mental Status Examination and Suicide Risk Assessment completed by Attending Physician prior to discharge.  Discharge destination:  Home  Is patient on multiple antipsychotic therapies  at discharge:  No   Has Patient had three or more failed trials of antipsychotic monotherapy by history:  No  Recommended Plan for Multiple Antipsychotic Therapies: NA   Medication List  As of 04/21/2011  3:03 PM   ASK your doctor about these medications      Indication    citalopram 20 MG tablet   Commonly known as: CELEXA   Take 1 tablet (20 mg total) by mouth at bedtime.       folic acid 1 MG tablet   Commonly known as: FOLVITE   Take 1 mg by mouth daily.       magnesium oxide 400 MG tablet   Commonly known as: MAG-OX   Take 1 tablet (400 mg total) by mouth 2 (two) times daily.       mulitivitamin with minerals Tabs   Take 1 tablet by mouth daily.       pantoprazole 40 MG tablet   Commonly known as: PROTONIX   Take 1 tablet (40 mg total) by mouth daily at 6 (six) AM.       traZODone 50 MG tablet   Commonly known as: DESYREL   Take 50 mg by mouth at bedtime. For sleep            Follow-up Information    Follow up with [facility] #  [phone]. (Call this number before you drink alcohol)       Follow up with [facility]. Mitchell [name], Spanish speaking therapist, told me to have you walk in  on Thursday at 8AM to be seen for your initial assessment)    Contact information:   [address] [[phone]         Follow-up recommendations:  Other:  Keep all scheduled follow-up appointments as recommended.  Comments:  Take all medications as prescribed.                       Report any adverse effects of medications to your outpatient provider promptly.   SignedArmandina Stammer I 04/21/2011, 3:03 PM

## 2011-04-22 ENCOUNTER — Ambulatory Visit (INDEPENDENT_AMBULATORY_CARE_PROVIDER_SITE_OTHER): Payer: Self-pay | Admitting: Family Medicine

## 2011-04-22 VITALS — BP 133/85 | HR 68 | Temp 97.7°F | Ht 61.25 in | Wt 169.6 lb

## 2011-04-22 DIAGNOSIS — M869 Osteomyelitis, unspecified: Secondary | ICD-10-CM

## 2011-04-22 MED ORDER — DOXYCYCLINE HYCLATE 100 MG PO TABS: 100.0000 mg | ORAL_TABLET | Freq: Two times a day (BID) | ORAL | Status: DC

## 2011-04-22 MED ORDER — INSULIN ASPART 100 UNIT/ML ~~LOC~~ SOLN: 4.0000 [IU] | Freq: Three times a day (TID) | SUBCUTANEOUS | Status: DC

## 2011-04-22 MED ORDER — LEVOFLOXACIN 500 MG PO TABS: 500.0000 mg | ORAL_TABLET | Freq: Every day | ORAL | Status: DC

## 2011-04-22 NOTE — Assessment & Plan Note (Signed)
Discussed with patient at length. Do not see any signs of infection but would be concerned about recurrent infection due to patient likely would need further intervention such as amputation another infection occurred. At this time will try some oral medications some doxycycline and Levaquin to cover for Pseudomonas. We'll continue these for 2 weeks. Patient is to continue the same pain medications already has and followup  with Dr. Louanne Belton next week. Patient may need followup with orthopedic physician as well. Did not do any imaging at this time but would consider it if we think it is getting worse. Patient encouraged to continue outpatient management and wound care as well.

## 2011-04-22 NOTE — Patient Instructions (Addendum)
Very nice to meet you. I am going to give you some antibiotics to take I when she to increase your insulin by one unit with each meal. I when she to come back and see Dr. Luan Pulling next week at scheduled appointment. I also want you to call and schedule appointment with your orthopedic surgeon.  Encantado de conocerte. Voy a darle algunos antibiticos para tomarI cuando ella para aumentar su insulina en una unidad con cada comida. Yo cuando ella de regresar y ver a la Dra. prxima semana rica en cita. Tambin quiero llamar y programar una cita con su cirujano ortopdico.

## 2011-04-22 NOTE — Progress Notes (Signed)
  Subjective:    Patient ID: Mitchell [name], male    DOB: 06/29/1975, 36 y.o.   MRN: [id number]  HPI 36 year old male with significant history of osteomyelitis of the right foot with multiple skin grafts over the years coming in with worsening right foot pain. Patient was seen by her primary care physician approximately 3 weeks ago, had been off medications for approximately one week before that time. Patient recently was admitted to behavioral health for 2 days due to some suicidal ideation. Patient states he is feeling much better and is on his medication and take it more regularly. Patient though states that the pain in his foot has increased from a regular 2/10 up to a 6/10. Patient denies any more redness denies any open sores. Patient has been seen by wound clinic on a regular basis and continue seeing them as such. Patient denies any discharge and states that the foot appears to look the same. Patient denies any fevers chills nausea vomiting abdominal pain shortness of breath or chest pain.   Review of Systems As stated in history of present illness    Objective:   Physical Exam Gen: No acute distress, normal affect Ext: Left foot is normal. Right foot shows evidence of previous skin grafting surgeries. There is minimal edema of the right foot and ankle. Nonhealing ulcer on the right first MTP joint without any purulent drainage on plantar aspect. This ulcer measures approximately 0.4 cm in diameter. There is not any evidence of skin breakdown surrounding it and good granulation tissue.  There is some callous formation on the forefoot. 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     Assessment & Plan:

## 2011-04-23 NOTE — Progress Notes (Signed)
Patient Discharge Instructions:  Psychiatric Admission Assessment Note Provided,  04/23/2011 After Visit Summary (AVS) Provided,  04/23/2011 Face Sheet Provided, 04/23/2011 Faxed/Sent to the Next Level Care provider:  04/23/2011 Sent Suicide Risk Assessment - Discharge Assessment 04/23/2011  Faxed to [facility] - [name] @ [phone]  Wandra Scot, 04/23/2011, 3:00 PM

## 2011-04-27 ENCOUNTER — Encounter: Payer: Self-pay | Admitting: Family Medicine

## 2011-04-27 ENCOUNTER — Ambulatory Visit (INDEPENDENT_AMBULATORY_CARE_PROVIDER_SITE_OTHER): Payer: Medicaid Other | Admitting: Family Medicine

## 2011-04-27 VITALS — BP 108/70 | HR 81 | Wt 165.0 lb

## 2011-04-27 DIAGNOSIS — M869 Osteomyelitis, unspecified: Secondary | ICD-10-CM

## 2011-04-27 DIAGNOSIS — K047 Periapical abscess without sinus: Secondary | ICD-10-CM | POA: Insufficient documentation

## 2011-04-27 DIAGNOSIS — F4325 Adjustment disorder with mixed disturbance of emotions and conduct: Secondary | ICD-10-CM

## 2011-04-27 DIAGNOSIS — E119 Type 2 diabetes mellitus without complications: Secondary | ICD-10-CM

## 2011-04-27 MED ORDER — TRAMADOL HCL 50 MG PO TABS: 50.0000 mg | ORAL_TABLET | Freq: Three times a day (TID) | ORAL | Status: AC | PRN

## 2011-04-27 MED ORDER — GLIMEPIRIDE 2 MG PO TABS: 2.0000 mg | ORAL_TABLET | Freq: Every day | ORAL | Status: DC

## 2011-04-27 MED ORDER — FOLIC ACID 1 MG PO TABS: 1.0000 mg | ORAL_TABLET | Freq: Every day | ORAL | Status: DC

## 2011-04-27 MED ORDER — SULFAMETHOXAZOLE-TRIMETHOPRIM 800-160 MG PO TABS: 1.0000 | ORAL_TABLET | Freq: Two times a day (BID) | ORAL | Status: AC

## 2011-04-27 NOTE — Progress Notes (Signed)
Interpreter [name] [place] Namihira for Dr Ritch 

## 2011-04-27 NOTE — Patient Instructions (Signed)
It was good to see you today! I want you to stop taking your insulin and start taking amaryl. Come back to see me in 1 week. (OK to double book)

## 2011-05-04 ENCOUNTER — Ambulatory Visit: Payer: Self-pay | Admitting: Family Medicine

## 2011-05-07 DIAGNOSIS — E118 Type 2 diabetes mellitus with unspecified complications: Secondary | ICD-10-CM | POA: Insufficient documentation

## 2011-05-07 NOTE — Assessment & Plan Note (Signed)
Bactrim in an attempt to treat both the abscess and the patient's foot. I will also make a referral to dentistry as I feel the patient likely needs incision and drainage. I will see the patient back in close followup.

## 2011-05-07 NOTE — Assessment & Plan Note (Signed)
As the patient is unable to afford as previously prescribed antibiotics I will make an attempt to switching to Bactrim and plan to see him back in one week. If he is not improving I will likely add additional antibiotic coverage. At some point in the not-too-distant future, when this acute episode has come down, I would like to get plain films of his feet to see if there is ongoing evidence of bony infection.

## 2011-05-07 NOTE — Progress Notes (Signed)
Patient ID: Mitchell [name], male   DOB: [birth year], 36 y.o.   MRN: [id number] Subjective: The patient is a 36 y.o. year old male who presents today for hospital followup and continued right foot pain.  1: Hospital followup: Patient was hospitalized again for a suicide attempt and suicidal ideation. Today he says that his mood is fine and that he is taking his medications well. He reports that he is avoiding alcohol. He does not feel that there is anything I can do to help him in this respect.  2: Right foot pain: Patient was seen here at the family practice clinic several days ago for his right foot pain. He was prescribed several antibiotics he has not been able to fill. He reports that they're too expensive. His right foot pain continues and he reports swelling and warmth. He does not report any new discharge from his foot. He denies any fevers, chills, nausea, vomiting, or diarrhea.  3: Dental abscess: Patient reports that he has been having significant swelling in his right lower jaw. This area is painful and he is having some pus drainage into his mouth. This been going on for several days. Systemic symptoms are not noted as explained in #2.  4: Diabetes: This was a new diagnosis giving him that his most recent hospitalization. He was started on metformin and insulin. The patient's A1c was obtained it was not terribly elevated during the hospital admission. He reports that his blood sugars have been routinely running in the 200s. He does not have any questions about his medication dosing.  Pt is a non-smoker and currently is denying any alcohol use, although he does have a history of such.  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  Objective:  Filed Vitals:   04/27/11 1423  BP: 108/70  Pulse: 81   Gen: No distress, happy HEENT: There is notable swelling of the right mandible. There is pain on palpation. Exam inside the mouth demonstrates poor dentition with swelling and possible  purulent drainage from an area in the mid section of the lower jaw on the right. CV: Regular rate and rhythm Resp: CTABl Ext: Left foot is normal. Right foot shows evidence of previous skin grafting surgeries. There is minimal edema of the right foot and ankle. Nonhealing ulcer on the right first MTP joint without any purulent drainage on plantar aspect. This ulcer measures approximately 0.4 cm in diameter. There is not any evidence of skin breakdown surrounding it and good granulation tissue. There is some callous formation on the forefoot. 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  The right midfoot is slightly swoolen and painful on palpation.  Pain is also present in the distal leg on palpation.  No knots or ropes.   Assessment/Plan:  Please also see individual problems in problem list for problem-specific plans.

## 2011-05-07 NOTE — Assessment & Plan Note (Signed)
While I would not be surprised if the patient does have some underlying diabetes, I feel that his current infections are likely what is causing his sugars to be particularly high. As this is a new diagnosis I think that jumping directly to insulin may be slightly premature. I will stop the patient's insulin for now and start a low-dose Amaryl.

## 2011-05-07 NOTE — Assessment & Plan Note (Signed)
I am very troubled by this patient's behavior. As he is not willing to discuss the emotional problems that are causing his suicide attempts with me it makes it somewhat hard to prevent future hospital admissions in the future. I tried to provide as much emotional support as is possible, and I will continue to stress the importance of medication compliance and avoiding alcohol or other mind altering drugs.

## 2011-05-18 ENCOUNTER — Encounter: Payer: Self-pay | Admitting: Family Medicine

## 2011-05-18 ENCOUNTER — Ambulatory Visit (INDEPENDENT_AMBULATORY_CARE_PROVIDER_SITE_OTHER): Payer: Medicaid Other | Admitting: Family Medicine

## 2011-05-18 VITALS — BP 118/64 | HR 76 | Temp 98.3°F | Ht 62.0 in | Wt 169.0 lb

## 2011-05-18 DIAGNOSIS — E118 Type 2 diabetes mellitus with unspecified complications: Secondary | ICD-10-CM

## 2011-05-18 DIAGNOSIS — K047 Periapical abscess without sinus: Secondary | ICD-10-CM

## 2011-05-18 DIAGNOSIS — M869 Osteomyelitis, unspecified: Secondary | ICD-10-CM

## 2011-05-18 DIAGNOSIS — F4325 Adjustment disorder with mixed disturbance of emotions and conduct: Secondary | ICD-10-CM

## 2011-05-18 MED ORDER — METFORMIN HCL 1000 MG PO TABS: 1000.0000 mg | ORAL_TABLET | Freq: Two times a day (BID) | ORAL | Status: DC

## 2011-05-18 MED ORDER — MAGNESIUM OXIDE 400 MG PO TABS: 400.0000 mg | ORAL_TABLET | Freq: Two times a day (BID) | ORAL | Status: DC

## 2011-05-18 MED ORDER — CITALOPRAM HYDROBROMIDE 40 MG PO TABS: 40.0000 mg | ORAL_TABLET | Freq: Every day | ORAL | Status: DC

## 2011-05-18 NOTE — Patient Instructions (Signed)
It was great to see you today! i want you to go over to the hospital and get and x-ray of your foot sometime in the next week or two. We are increasing the dose of your metformin to 1000mg  two times daily We are also increasing the dose of your celexa to 40 mg daily. Come back to see me in about a month or sooner if you need to.

## 2011-05-18 NOTE — Assessment & Plan Note (Signed)
After discussion with the patient, we will try increasing his Celexa to 40 mg daily and see if this helps further with his mood. Patient says that his mood is fairly good most of the time is interested in trying a slightly higher dose.

## 2011-05-18 NOTE — Assessment & Plan Note (Signed)
The patient does not have any evidence of active infection at this time. I will obtain an x-ray of the right foot to see if there is any evidence of bone breakdown.

## 2011-05-18 NOTE — Assessment & Plan Note (Signed)
I will increase the patient's metformin 2000 mg twice daily and see him back in one month. At that time we will review his daily blood sugars. He is instructed to bring his blood sugar meter with him to this appointment.

## 2011-05-18 NOTE — Assessment & Plan Note (Signed)
Improved on antibiotics. Dental referral has been made. If his condition worsens we will restart antibiotics.

## 2011-05-18 NOTE — Progress Notes (Signed)
Patient ID: Mitchell [name], male   DOB: Apr 08, 1975, 36 y.o.   MRN: [id number] Subjective: The patient is a 36 y.o. year old male who presents today for followup of his diabetes, dental problems, foot problems, and mood.  1: Diabetes: Patient reports that his minimum blood sugar has been 74. His preprandial blood sugars have typically been running in the range of 122-20. He has not brought his meter with him today. He is not having significant problems with diarrhea or upset stomach.  2: Foot pain and swelling: The patient reports that, after finishing his course of antibiotics, his foot is no longer causing him problems. He continues to have the nonhealing wound on the plantar aspect of his foot, and has not been to the wound center in several weeks.  3: Mood disorder: Patient reports that he is still taking his Celexa but he feels it does help somewhat with some of his mood problems. He denies any current suicidal or homicidal ideation.  4: Jaw swelling: Since completing his course of antibiotics the patient notes that his jaw swelling has decreased significantly. He still has not heard from a dentist about obtaining an appointment.  No current alcohol or tobacco use. ROS is per HPI. Social and medical history reviewed and updated as appropriate.  Objective:  Filed Vitals:   05/18/11 1337  BP: 118/64  Pulse: 76  Temp: 98.3 F (36.8 C)   Gen: No acute distress, happy and appropriate HEENT: Mucous members are moist, extraocular movements intact, there is no notable swelling of the jaw at this point in time. CV: Regular rate and rhythm, no murmurs appreciated Resp: Clear to auscultation bilaterally Ext: Left foot is normal. Right foot shows evidence of previous skin grafting surgeries. There is minimal edema of the right foot and ankle. Nonhealing ulcer on the right first MTP joint without any purulent drainage on plantar aspect. This ulcer measures approximately 0.4 cm in diameter. There is not  any evidence of skin breakdown surrounding it and good granulation tissue. There is some callous formation on the forefoot. 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   Assessment/Plan:  Please also see individual problems in problem list for problem-specific plans.

## 2011-05-25 ENCOUNTER — Ambulatory Visit (HOSPITAL_COMMUNITY)
Admission: RE | Admit: 2011-05-25 | Discharge: 2011-05-25 | Disposition: A | Discharge status: Home / Self Care | Payer: Self-pay | Source: Ambulatory Visit | Attending: Family Medicine | Admitting: Family Medicine

## 2011-05-25 DIAGNOSIS — M899 Disorder of bone, unspecified: Secondary | ICD-10-CM | POA: Insufficient documentation

## 2011-05-25 DIAGNOSIS — L02619 Cutaneous abscess of unspecified foot: Secondary | ICD-10-CM | POA: Insufficient documentation

## 2011-05-25 DIAGNOSIS — M949 Disorder of cartilage, unspecified: Secondary | ICD-10-CM | POA: Insufficient documentation

## 2011-05-25 DIAGNOSIS — M869 Osteomyelitis, unspecified: Secondary | ICD-10-CM

## 2011-05-25 DIAGNOSIS — L03119 Cellulitis of unspecified part of limb: Secondary | ICD-10-CM | POA: Insufficient documentation

## 2011-06-16 ENCOUNTER — Ambulatory Visit: Payer: Self-pay | Admitting: Family Medicine

## 2011-06-16 VITALS — BP 128/84 | HR 90 | Temp 99.0°F | Resp 16 | Wt 163.0 lb

## 2011-06-16 DIAGNOSIS — F329 Major depressive disorder, single episode, unspecified: Secondary | ICD-10-CM

## 2011-06-16 DIAGNOSIS — E118 Type 2 diabetes mellitus with unspecified complications: Secondary | ICD-10-CM

## 2011-06-16 DIAGNOSIS — E119 Type 2 diabetes mellitus without complications: Secondary | ICD-10-CM

## 2011-06-16 DIAGNOSIS — F1994 Other psychoactive substance use, unspecified with psychoactive substance-induced mood disorder: Secondary | ICD-10-CM

## 2011-06-16 DIAGNOSIS — F1010 Alcohol abuse, uncomplicated: Secondary | ICD-10-CM

## 2011-06-16 DIAGNOSIS — F32A Depression, unspecified: Secondary | ICD-10-CM

## 2011-06-16 NOTE — Patient Instructions (Signed)
Dependencia qumica  (Chemical Dependency)  La dependencia qumica es una forma de adiccin a las drogas o al alcohol. Se caracteriza por la conducta repetida de bsqueda y [place] de drogas y alcohol a pesar de las consecuencias dainas para la salud y la seguridad propia y Engineer, agricultural de Economist.  FACTORES DE RIESGO  Ciertas situaciones o conductas aumentan el riesgo de sufrir dependencia qumica. Ellos son:   Mitchell [name] familiar de dependencia qumica.   Historia de trastornos Mitchell [name], como depresin o ansiedad.   Ambiente familiar en los que las drogas y el alcohol estn fcilmente disponibles.   Consumo de alcohol o drogas a edades tempranas.  SNTOMAS  Los siguientes sntomas indican dependencia qumica:   Imposibilidad para poner lmite al consumo.   Nuseas, transpiracin, temblores y ansiedad que aparecen cuando no se consume.   Aumento en la cantidad de droga o alcohol necesaria para emborracharse o lograr la euforia provocada por la droga.  Las Education officer, museum sienten estos sntomas pueden evaluar su dependencia, [employer] las siguientes preguntas:   Sus familiares o amigos le han dicho que estn preocupados por su consumo de alcohol o drogas?   Alguna vez le contaron las cosas que usted haca bajo los efectos del consumo, y que no recuerda?   Miente con respecto al consumo o en las cantidades que [place]?   Tiene dificultad para Education officer, environmental las tareas de [employer] si no consume?   Su rendimiento en el trabajo o la escuela es menor debido a su consumo?   Se siente mal al consumir pero lo sigue haciendo?   Si no consume, se siente incmodo en situaciones sociales?   Consume para olvidar problemas?  Si la respuesta es positiva a alguna de estas preguntas, indica que tiene dependencia qumica. Le sugerimos que realice una evaluacin con un profesional.  Document Released: 01/11/2005 Document Revised: 12/31/2010 [facility] Patient Information 2012 [place],  [place].  Depresin (Depression) Usted presenta signos de depresin. Es un trastorno que puede ocurrir a Actuary. Con frecuencia es difcil de Public house manager. Mitchell [name] persona puede estar deprimida y adems tener momentos de Archivist. La depresin interfiere en la capacidad bsica para funcionar en la vida diaria. Obstaculiza tanto las [employer] hbitos de sueo, alimentacin y [place]. CAUSAS Se cree que la causa es un desequilibrio en las sustancias qumicas del cerebro. El origen puede ser un hecho displacentero. La crisis en una relacin, la muerte de un familiar, preocupaciones econmicas, la jubilacin y otros factores estresantes son causas normales de depresin. Tambin puede comenzar sin causa aparente. Otros factores que pueden tener incidencia: algunas enfermedades, algunos medicamentos, factores genticos, y consumo excesivo de alcohol o drogas. SNTOMAS  Sentimiento de desdicha o desvalorizacin.   Cansancio crnico o sensacin de agotamiento.   Pensamientos y acciones de autodestruccin.   Dificultad para dormir o dormir demasiado.   Comer ms de lo habitual o no alimentarse en absoluto.   Cefaleas o ansiedad.   Dificultad para concentrase o tomar decisiones.   Sntomas fsicos sin causa y consumo de drogas.  TRATAMIENTO Generalmente mejora si se realiza Pharmacist, community. Entre ellos se incluyen:  Medicamentos antidepresivos. Puede demorara algunas semanas antes de llegar a la dosis [place] y a los beneficios.   Converse con un terapeuta, ministro, consejero o amigo. Estas personas pueden ayudarlo a comprender su problema y a controlar nuevamente sus actos.   Consuma una dieta saludable.   Ralice actividad fisica de [place] regular, como caminar durante 30 minutos [address].  No consuma alcohol ni drogas.  El tratamiento de la depresin puede llevar 6 meses o ms. El tratamiento debe mantenerse para evitar que los sntomas vuelvan a Research officer, trade union.  Asegrese de comunicarse con el profesional que lo asiste y Surveyor, mining una entrevista de control, como se lo ha sugerido el equipo que lo ha [place]. SOLICITE ATENCIN MDICA DE INMEDIATO SI:  Comienza a tener pensamientos acerca de lastimarse o daar a Economist.   Comunquese con el servicio de emergencias de su localidad (911 en los [place]).   Concurra al servicio de emergencias mdicas de su localidad.   Comunquese con la Lnea Telefnica Nacional para la Prevencin del Suicidio (National Suicide Prevention Lifeline ) al 1-800-273-TALK 9798049368).  Document Released: 01/11/2005 Document Revised: 12/31/2010 [facility] Patient Information 2012 [place], [place].

## 2011-06-16 NOTE — Progress Notes (Signed)
  Subjective:    Patient ID: Cortlandt Capuano, male    DOB: Jun 08, 1975, 36 y.o.   MRN: [id number]  HPI Pt presents today with request to check CBG. Family is translating.  Pt had binge drinking episode earlier today. Drank 4 40oz bottles of beer.  Took insulin, and wants [place] to check CBG.  Baseline hx/o adjustment/mood disorder/substance abuse related mood disorder. No discernable suicidal ideations.  No abd pain, polyuria, polydypsia.  Has had BH admission in the past.    Review of Systems See HPI, otherwise ROS negative     Objective:   Physical Exam Gen: up in chair, intermittently tearful, emotionally labile, intoxicated HEENT: NCAT, EOMI, TMs clear bilaterally CV: RRR, no murmurs auscultated PULM: CTAB, no wheezes, rales, rhoncii ABD: S/NT/+ bowel sounds  EXT: 2+ peripheral pulses    Assessment & Plan:

## 2011-06-16 NOTE — Assessment & Plan Note (Signed)
CBG today was 121. Advised against further ETOH intake. Instructed to otherwise continue with normal diabetic regimen

## 2011-06-17 NOTE — Assessment & Plan Note (Signed)
Flare of this today 2/2 alcohol. Mildly intoxicated. Noted behavioral admission in the past. No discernible HI/SI. Discussed importance of follow up with PCP in 24-48 hours.

## 2011-06-18 ENCOUNTER — Ambulatory Visit: Payer: Self-pay | Admitting: Family Medicine

## 2011-07-14 ENCOUNTER — Encounter: Payer: Self-pay | Admitting: Family Medicine

## 2011-07-14 ENCOUNTER — Ambulatory Visit (INDEPENDENT_AMBULATORY_CARE_PROVIDER_SITE_OTHER): Payer: Self-pay | Admitting: Family Medicine

## 2011-07-14 VITALS — BP 129/91 | HR 78 | Ht 62.0 in | Wt 161.0 lb

## 2011-07-14 DIAGNOSIS — E118 Type 2 diabetes mellitus with unspecified complications: Secondary | ICD-10-CM

## 2011-07-14 DIAGNOSIS — F1994 Other psychoactive substance use, unspecified with psychoactive substance-induced mood disorder: Secondary | ICD-10-CM

## 2011-07-14 DIAGNOSIS — E119 Type 2 diabetes mellitus without complications: Secondary | ICD-10-CM

## 2011-07-14 DIAGNOSIS — M869 Osteomyelitis, unspecified: Secondary | ICD-10-CM

## 2011-07-14 DIAGNOSIS — T50904A Poisoning by unspecified drugs, medicaments and biological substances, undetermined, initial encounter: Secondary | ICD-10-CM

## 2011-07-14 MED ORDER — METFORMIN HCL 1000 MG PO TABS: 1000.0000 mg | ORAL_TABLET | Freq: Two times a day (BID) | ORAL | Status: DC

## 2011-07-14 MED ORDER — IBUPROFEN 600 MG PO TABS: 600.0000 mg | ORAL_TABLET | Freq: Three times a day (TID) | ORAL | Status: AC | PRN

## 2011-07-14 NOTE — Assessment & Plan Note (Addendum)
Contracts for safety.  Has shown initiative in restablishing with his therapist in 2 days.  Discussed red flags to seek urgent medical care.  He is agreeable to restarting celexa.  Was on 40 mg daily, advised a half tab for 1 week, the back to 40 mg daily.  Will fu with PCP in 1 week to assess continue compliance and address any further needs.

## 2011-07-14 NOTE — Progress Notes (Signed)
  Subjective:    Patient ID: Mitchell [name], male    DOB: Jan 10, 1976, 36 y.o.   MRN: [id number]  HPI  Work in appt to follow-up blood sugar.  Appears reason for visit is that he has been under increased stress after the death of his son.  Substance induced mood disorder:  States has been sober for 5 months.  This is not consistent with previous office histories.  States has been more depressed after his oldest son passed away in [place] from a GSW.  [place], [place].  States he has committed himself to improving his life because that was the promise he made to his son as he was dying.  He has made an appointment with his therapist in 2 days.He states hs has a good relationship with this therapist.  Has not taken celexa or any of his meds in 1.5 months  Diabetes;  Has not taken either metformin or glipizide for 1.5 months due to stress.  Has not check his blood sugar since march based on his glucometer he brings in today  Foot wound:  Has chronic foot wound, previous doctors concerned for osteo.  Patient has an appointment tomorrow at the wound center to continue care and get re-evaluated.  Review of Systems See HPI    Objective:   Physical Exam Spanish interpeter present  GEN: Alert & Oriented CV:  Regular Rate & Rhythm, no murmur Respiratory:  Normal work of breathing, CTAB Abd:  + BS, soft, no tenderness to palpation Ext: right foot with chronic wound.  Not open, no purlence.  Painful to palpation. No cellulitis.       Assessment & Plan:

## 2011-07-14 NOTE — Progress Notes (Signed)
Interpreter Ivon Oelkers Namihira for Dr Brisco 

## 2011-07-14 NOTE — Assessment & Plan Note (Signed)
a1c today 6.5% off medicine for at least 1.5 months.  Advised will only restart metformin at this time.  Will follow-up with PCP and will continue to monitor to see if he needs to ass back amaryl.

## 2011-07-14 NOTE — Assessment & Plan Note (Signed)
Chronic foot wound.  Hard to evaluate- possible osteo given chronicity.  Last xray does not show definite signs.  No overt cellulitis or signs of systemic infection.  Patient has follow-up with established wound care center.  Will defer any further evaluation at this time.

## 2011-07-15 ENCOUNTER — Encounter (HOSPITAL_BASED_OUTPATIENT_CLINIC_OR_DEPARTMENT_OTHER): Payer: Self-pay | Attending: Internal Medicine

## 2011-07-15 DIAGNOSIS — L84 Corns and callosities: Secondary | ICD-10-CM | POA: Insufficient documentation

## 2011-07-15 DIAGNOSIS — E1149 Type 2 diabetes mellitus with other diabetic neurological complication: Secondary | ICD-10-CM | POA: Insufficient documentation

## 2011-07-15 DIAGNOSIS — L97509 Non-pressure chronic ulcer of other part of unspecified foot with unspecified severity: Secondary | ICD-10-CM | POA: Insufficient documentation

## 2011-07-15 DIAGNOSIS — Z79899 Other long term (current) drug therapy: Secondary | ICD-10-CM | POA: Insufficient documentation

## 2011-07-15 DIAGNOSIS — E1142 Type 2 diabetes mellitus with diabetic polyneuropathy: Secondary | ICD-10-CM | POA: Insufficient documentation

## 2011-07-15 DIAGNOSIS — E1169 Type 2 diabetes mellitus with other specified complication: Secondary | ICD-10-CM | POA: Insufficient documentation

## 2011-07-15 LAB — GLUCOSE, CAPILLARY: Glucose-Capillary: 314 mg/dL — ABNORMAL HIGH (ref 70–99)
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
## 2011-07-30 ENCOUNTER — Ambulatory Visit (INDEPENDENT_AMBULATORY_CARE_PROVIDER_SITE_OTHER): Payer: Self-pay | Admitting: Family Medicine

## 2011-07-30 ENCOUNTER — Encounter: Payer: Self-pay | Admitting: Family Medicine

## 2011-07-30 VITALS — BP 121/78 | HR 68 | Temp 98.2°F | Ht 62.0 in | Wt 162.9 lb

## 2011-07-30 DIAGNOSIS — B353 Tinea pedis: Secondary | ICD-10-CM

## 2011-07-30 DIAGNOSIS — K047 Periapical abscess without sinus: Secondary | ICD-10-CM

## 2011-07-30 DIAGNOSIS — F4325 Adjustment disorder with mixed disturbance of emotions and conduct: Secondary | ICD-10-CM

## 2011-07-30 DIAGNOSIS — E118 Type 2 diabetes mellitus with unspecified complications: Secondary | ICD-10-CM

## 2011-07-30 MED ORDER — GLIMEPIRIDE 4 MG PO TABS: 4.0000 mg | ORAL_TABLET | Freq: Every day | ORAL | Status: DC

## 2011-07-30 MED ORDER — KETOCONAZOLE 2 % EX GEL: CUTANEOUS | Status: DC

## 2011-07-30 MED ORDER — GLIMEPIRIDE 2 MG PO TABS: 4.0000 mg | ORAL_TABLET | Freq: Every day | ORAL | Status: DC

## 2011-07-30 MED ORDER — CLINDAMYCIN HCL 300 MG PO CAPS: 300.0000 mg | ORAL_CAPSULE | Freq: Three times a day (TID) | ORAL | Status: AC

## 2011-07-30 NOTE — Patient Instructions (Signed)
It was good to see you today! I want you to come back in 1 week so we can check on your foot. Please increase the dose of your medication glimepiride.  This is for your high blood sugars.  I have given you a new prescription. I am also giving you an antibiotic for your infected tooth.

## 2011-07-30 NOTE — Assessment & Plan Note (Signed)
I am somewhat uncertain if this is a fungal infection versus a bacterial superinfection. Topical ketoconazole, also cover bacterial with the oral clindamycin used for his dental infection and will have him return to clinic in one week for recheck

## 2011-07-30 NOTE — Assessment & Plan Note (Signed)
Patient does report some elevated blood sugars in symptoms from these. I think this is likely related to his chronically infected tooth. I will increase his Amaryl to 4 mg daily. Patient is not having any problems with hypoglycemia. Last A1c was 6.5.

## 2011-07-30 NOTE — Progress Notes (Signed)
Subjective: The patient is a 36 y.o. year old male who presents today for f/u.  1) Foot infection: Pt reports that one month ago area came up in the area of medial maleolus.  Was red and slightly painful.  Since then has been enlarging in a circular fashion.the area is mildly scaling. He does not note any itching. Is not having any systemic symptoms. He continues to be seen wound care but has not mentioned this finding to them. 2: Depression: Patient reports that his mood is fine. He denies any suicidal or homicidal thoughts. He denies any excessive sadness. Patient reports that he continues to take his Celexa with no significant problems. 3. Ankle pain: Patient continues to have problems with a tooth in his right lower jaw. He was seen by a dentist 15 days ago. Dentist does refer him to an Transport planner. He continues to have pain, swelling, and difficulty with any pressure over the right side of his face or neck. 4. Diabetes: Patient reports that his blood sugars have generally been in the 150s in the mornings when he awakens. Recently, however, they have been creeping up into the  300s. He does have some problems with dizziness and his blood sugar is at high.  Patient's past medical, social, and family history were reviewed and updated as appropriate. History  Substance Use Topics  . Smoking status: Never Smoker   . Smokeless tobacco: Never Used  . Alcohol Use: 1.2 oz/week    2 Cans of beer per week     occasional    Objective:  Filed Vitals:   07/30/11 0903  BP: 121/78  Pulse: 68  Temp: 98.2 F (36.8 C)   Gen: NAD HEENT: Right lower molar is mostly missing with some swelling and significant tenderness surrounding it.  No frank pus.  Right side of face and neck is tender to touch. Ext: 4cm circular area of slight erythema with scaling. Area is somewhat tender to touch but is not warm. There is no erythema elsewhere. Other chronic wounds are as described in previous notes with little  improvement.  Assessment/Plan:  Please also see individual problems in problem list for problem-specific plans.

## 2011-07-30 NOTE — Assessment & Plan Note (Signed)
Patient appears to have a worsening of his symptoms. He has been referred to an oral surgeon by a dentist. He plans on going with them. Due to worsening of his symptoms on exam, I will give him a ten-day course of clindamycin.

## 2011-07-30 NOTE — Assessment & Plan Note (Signed)
Patient reports that his mood is stable. I have some concerns about this as he has a tendency to report this to me when there are things that are bothering him. Per report of our interpreter his son was recently killed in [place]. I am concerned that his mood will take a turn for the worse and wants close followup because of this. I will not adjust his medications at this point in time. We will try to do a PHQ-9 at his next visit.

## 2011-08-05 ENCOUNTER — Encounter: Payer: Self-pay | Admitting: Family Medicine

## 2011-08-05 ENCOUNTER — Ambulatory Visit (INDEPENDENT_AMBULATORY_CARE_PROVIDER_SITE_OTHER): Payer: Self-pay | Admitting: Family Medicine

## 2011-08-05 ENCOUNTER — Encounter (HOSPITAL_BASED_OUTPATIENT_CLINIC_OR_DEPARTMENT_OTHER): Payer: Self-pay | Attending: Internal Medicine

## 2011-08-05 VITALS — BP 127/79 | HR 72 | Temp 98.7°F | Ht 62.0 in | Wt 161.8 lb

## 2011-08-05 DIAGNOSIS — E118 Type 2 diabetes mellitus with unspecified complications: Secondary | ICD-10-CM

## 2011-08-05 DIAGNOSIS — K047 Periapical abscess without sinus: Secondary | ICD-10-CM

## 2011-08-05 DIAGNOSIS — L84 Corns and callosities: Secondary | ICD-10-CM | POA: Insufficient documentation

## 2011-08-05 DIAGNOSIS — L97509 Non-pressure chronic ulcer of other part of unspecified foot with unspecified severity: Secondary | ICD-10-CM | POA: Insufficient documentation

## 2011-08-05 DIAGNOSIS — E1169 Type 2 diabetes mellitus with other specified complication: Secondary | ICD-10-CM | POA: Insufficient documentation

## 2011-08-05 DIAGNOSIS — F4325 Adjustment disorder with mixed disturbance of emotions and conduct: Secondary | ICD-10-CM

## 2011-08-05 DIAGNOSIS — M869 Osteomyelitis, unspecified: Secondary | ICD-10-CM

## 2011-08-05 DIAGNOSIS — E1149 Type 2 diabetes mellitus with other diabetic neurological complication: Secondary | ICD-10-CM | POA: Insufficient documentation

## 2011-08-05 DIAGNOSIS — E1142 Type 2 diabetes mellitus with diabetic polyneuropathy: Secondary | ICD-10-CM | POA: Insufficient documentation

## 2011-08-05 DIAGNOSIS — Z79899 Other long term (current) drug therapy: Secondary | ICD-10-CM | POA: Insufficient documentation

## 2011-08-05 NOTE — Progress Notes (Signed)
  Subjective:    Patient ID: Mitchell [name], male    DOB: 11/04/1975, 36 y.o.   MRN: [id number]  HPI Here for 1 week follow-up with PCP  Right foot:  History of osteomyelitis, possible cellulitis.  At last visit, PCp put patient on topical antifungal and was on clindamycin for tooth.  Patient saw wound care clinic this week.  I cannot see their documentation but patient states he is returning in one week for further treatment with a cast.  Tooth pain:  Much improved since being onclindamycin.  No nausea, vomting, fever.  Has not yet heard about his appt with oral surgery.  DM:  Was increased to 4 mg of glipizide at last appt.  Fasting cbg this am 160.  Last a1c 6.5%   Review of Systems See HPI    Objective:   Physical Exam  GEN: Alert & Oriented, No acute distress CV:  Regular Rate & Rhythm, no murmur Respiratory:  Normal work of breathing, CTAB Abd:  + BS, soft, no tenderness to palpation Ext: no pre-tibial edema Right foot:  Did not remove bandage placed by wound care this am. Mouth:  Front gums bleeding, tender.    no facial swelling      Assessment & Plan:

## 2011-08-05 NOTE — Assessment & Plan Note (Signed)
Stable.  Will finish course of abx.  Advised him of importance of getting definitive treatment with oral surgery.

## 2011-08-05 NOTE — Progress Notes (Signed)
Subjective:     Patient ID: Mitchell [name], male   DOB: [birth year], 36 y.o.   MRN: [id number]  HPI Mitchell [name] is a 36 y/o gentleman with a history of diabetes, chronic leg ulcer, and dental abscess who presents to clinic today for management of his multiple medical issues.   He says he has felt well since he was in clinic last week. Since starting the antibiotics, the wound in his mouth has felt much better. It is still a little bit painful, but definitely better.   He states that he was seen in the wound clinic this morning for the wound on his foot. He says the physicians there said the wound is not healing how they want it to, so they are going to put some type of cast on his foot.   He blood sugar was 163 this morning.   He has no other complaints today. Denies fever, chills, nausea, diarrhea.   Specifically denies any changes in mood.   Review of Systems As per above.    Objective:   Physical Exam General: No acute distress. HEENT: MMM, previous wound in the mouth is non-erythematous, no pus. Still tender to palpation.   CV: RRR, no murmurs, rubs, or gallops Pulm: Lungs clear to auscultation bilaterally Ext: Right foot is wrapped in a bandage. Unable to evaluate mood.     Assessment:  36 year old man with diabetes mellitus following up for a chronic foot wound and a dental abscess. Doing well, both his foot wound and abscess appear well controlled.   Plan:  1. Foot wound: Being followed by the wound care clinic. Will defer management to them.  2. Dental abscess: Encouraged to finish prescribed course of clindamycin. Explained that the antibiotics will not cure the abscess, and that he needs to make an appointment to see the oral surgeon.

## 2011-08-05 NOTE — Patient Instructions (Addendum)
Follow up in 3 months

## 2011-08-05 NOTE — Assessment & Plan Note (Addendum)
Reports he is doing well.  Not eager to discuss.  Will follow-up with PCP

## 2011-08-05 NOTE — Assessment & Plan Note (Signed)
Now under care of wound care center.

## 2011-08-05 NOTE — Assessment & Plan Note (Signed)
Stable. Continue current dose. 

## 2011-08-12 ENCOUNTER — Encounter (HOSPITAL_BASED_OUTPATIENT_CLINIC_OR_DEPARTMENT_OTHER): Payer: Self-pay

## 2011-08-19 ENCOUNTER — Other Ambulatory Visit (HOSPITAL_BASED_OUTPATIENT_CLINIC_OR_DEPARTMENT_OTHER): Payer: Self-pay | Admitting: Internal Medicine

## 2011-08-19 ENCOUNTER — Ambulatory Visit (HOSPITAL_COMMUNITY)
Admission: RE | Admit: 2011-08-19 | Discharge: 2011-08-19 | Disposition: A | Discharge status: Home / Self Care | Payer: Self-pay | Source: Ambulatory Visit | Attending: Internal Medicine | Admitting: Internal Medicine

## 2011-08-19 DIAGNOSIS — M79671 Pain in right foot: Secondary | ICD-10-CM

## 2011-08-19 DIAGNOSIS — M79609 Pain in unspecified limb: Secondary | ICD-10-CM | POA: Insufficient documentation

## 2011-08-19 DIAGNOSIS — M899 Disorder of bone, unspecified: Secondary | ICD-10-CM | POA: Insufficient documentation

## 2011-08-19 LAB — GLUCOSE, CAPILLARY: Glucose-Capillary: 243 mg/dL — ABNORMAL HIGH (ref 70–99)

## 2011-08-26 ENCOUNTER — Encounter (HOSPITAL_BASED_OUTPATIENT_CLINIC_OR_DEPARTMENT_OTHER): Payer: Self-pay | Attending: Internal Medicine

## 2011-08-26 DIAGNOSIS — E1169 Type 2 diabetes mellitus with other specified complication: Secondary | ICD-10-CM | POA: Insufficient documentation

## 2011-08-26 DIAGNOSIS — L97509 Non-pressure chronic ulcer of other part of unspecified foot with unspecified severity: Secondary | ICD-10-CM | POA: Insufficient documentation

## 2011-08-27 LAB — GLUCOSE, CAPILLARY: Glucose-Capillary: 113 mg/dL — ABNORMAL HIGH (ref 70–99)

## 2011-09-08 ENCOUNTER — Ambulatory Visit (INDEPENDENT_AMBULATORY_CARE_PROVIDER_SITE_OTHER): Payer: Self-pay | Admitting: Family Medicine

## 2011-09-08 VITALS — BP 102/65 | HR 62 | Temp 98.1°F | Wt 160.1 lb

## 2011-09-08 DIAGNOSIS — E118 Type 2 diabetes mellitus with unspecified complications: Secondary | ICD-10-CM

## 2011-09-08 DIAGNOSIS — R358 Other polyuria: Secondary | ICD-10-CM

## 2011-09-08 DIAGNOSIS — R197 Diarrhea, unspecified: Secondary | ICD-10-CM

## 2011-09-08 DIAGNOSIS — K529 Noninfective gastroenteritis and colitis, unspecified: Secondary | ICD-10-CM

## 2011-09-08 LAB — POCT URINALYSIS DIPSTICK
Nitrite, UA: NEGATIVE
Spec Grav, UA: 1.02
Urobilinogen, UA: 0.2
pH, UA: 6.5

## 2011-09-08 LAB — GLUCOSE, CAPILLARY: Glucose-Capillary: 145 mg/dL — ABNORMAL HIGH (ref 70–99)

## 2011-09-08 LAB — POCT UA - MICROSCOPIC ONLY

## 2011-09-08 NOTE — Addendum Note (Signed)
Addended by: [place], Terrance Usery on: 09/08/2011 04:19 PM   Modules accepted: Orders

## 2011-09-08 NOTE — Progress Notes (Signed)
Patient ID: Mitchell [name], male   DOB: [birth year], 36 y.o.   MRN: [id number] Subjective: The patient is a 36 y.o. year old male who presents today for elevated blood sugar.  Patient reports that, for the last week he has been having problems with elevated blood sugars (into the 400s).  Prior to one week ago, reports blood sugars have been in the 140s-160s.  Is still taking both metformin and glimepiride.  Is having some hot flashes but no actual fevers.  Has been having some diarrhea (occasionally had a little blood) for the last month.  Has taken pepto-bismal which helps.  Has also had occasionally nosebleeds.  Stopped taking celexa about 4 days ago.  Patient's past medical, social, and family history were reviewed and updated as appropriate. History  Substance Use Topics  . Smoking status: Never Smoker   . Smokeless tobacco: Never Used  . Alcohol Use: 1.2 oz/week    2 Cans of beer per week     occasional    Objective:  Filed Vitals:   09/08/11 0940  BP: 124/80  Pulse: 68  Temp: 98.1 F (36.7 C)   Gen: NAD HEENT: MMM, poor dentition but no obvious infection, TM normal bilaterally CV: RRR Resp: CTABL Ext: Small, 1cm ulcer on base of right foot, no obvious drainage or erythema  Assessment/Plan:  Please also see individual problems in problem list for problem-specific plans.

## 2011-09-08 NOTE — Assessment & Plan Note (Signed)
Patient is using medications appropriately.  His CBG is not terribly elevated today.  i am not entirely sure what is causing his problems but he is not terribly hyperglycemic today and his last A1c was less than 7.  i am going to suggest that we monitor and wait.  i wonder if he has some type of minor infection causing his problems.  I will send UA and culture along with stool studies, in case diarrhea is inciting event.  Plan to see him back in 2 weeks.  If his diabetes has suddenly worsened without infection, will need to begin inuslin therapy but would prefer to avoid this if possible.

## 2011-09-08 NOTE — Patient Instructions (Addendum)
I am not entirely sure what has been causing your high blood sugars.  I would like to see you back in about two weeks so we can see if they are continuing to cause you problems. I want to get a stool sample so we can look for any infections that are causing your diarrhea.  We will get back with you on results of this but these tests typically take about a week to get.  No estoy del todo seguro de lo que ha sido la causa de sus altos niveles de International aid/development worker. Me gustara verte de nuevo en un par de semanas para que podamos ver si siguen causarle problemas. Quiero obtener una muestra de heces para que podamos buscar cualquier tipo de infeccin que causan la diarrea. Nos pondremos en contacto con usted [employer] de [place], [place] estas pruebas suelen tardar alrededor de Neomia Dear semana para llegar.

## 2011-09-29 ENCOUNTER — Ambulatory Visit: Payer: Self-pay | Admitting: Family Medicine

## 2011-09-30 ENCOUNTER — Encounter (HOSPITAL_BASED_OUTPATIENT_CLINIC_OR_DEPARTMENT_OTHER): Payer: No Typology Code available for payment source | Attending: Internal Medicine

## 2011-12-08 ENCOUNTER — Emergency Department (HOSPITAL_COMMUNITY)
Admission: EM | Admit: 2011-12-08 | Discharge: 2011-12-09 | Disposition: A | Discharge status: Home / Self Care | Payer: Self-pay | Attending: Emergency Medicine | Admitting: Emergency Medicine

## 2011-12-08 ENCOUNTER — Encounter (HOSPITAL_COMMUNITY): Payer: Self-pay | Admitting: Emergency Medicine

## 2011-12-08 DIAGNOSIS — F419 Anxiety disorder, unspecified: Secondary | ICD-10-CM

## 2011-12-08 DIAGNOSIS — F411 Generalized anxiety disorder: Secondary | ICD-10-CM | POA: Insufficient documentation

## 2011-12-08 DIAGNOSIS — F3289 Other specified depressive episodes: Secondary | ICD-10-CM | POA: Insufficient documentation

## 2011-12-08 DIAGNOSIS — F329 Major depressive disorder, single episode, unspecified: Secondary | ICD-10-CM

## 2011-12-08 DIAGNOSIS — Z8719 Personal history of other diseases of the digestive system: Secondary | ICD-10-CM | POA: Insufficient documentation

## 2011-12-08 DIAGNOSIS — F1010 Alcohol abuse, uncomplicated: Secondary | ICD-10-CM | POA: Insufficient documentation

## 2011-12-08 DIAGNOSIS — Z79899 Other long term (current) drug therapy: Secondary | ICD-10-CM | POA: Insufficient documentation

## 2011-12-08 DIAGNOSIS — Z8739 Personal history of other diseases of the musculoskeletal system and connective tissue: Secondary | ICD-10-CM | POA: Insufficient documentation

## 2011-12-08 DIAGNOSIS — F1410 Cocaine abuse, uncomplicated: Secondary | ICD-10-CM | POA: Insufficient documentation

## 2011-12-08 DIAGNOSIS — Z8659 Personal history of other mental and behavioral disorders: Secondary | ICD-10-CM | POA: Insufficient documentation

## 2011-12-08 DIAGNOSIS — F10929 Alcohol use, unspecified with intoxication, unspecified: Secondary | ICD-10-CM

## 2011-12-08 LAB — CBC
HCT: 46.3 % (ref 39.0–52.0)
MCH: 32.4 pg (ref 26.0–34.0)
MCHC: 36.7 g/dL — ABNORMAL HIGH (ref 30.0–36.0)
MCV: 88.4 fL (ref 78.0–100.0)
Platelets: 118 10*3/uL — ABNORMAL LOW (ref 150–400)
RDW: 13.8 % (ref 11.5–15.5)

## 2011-12-08 LAB — COMPREHENSIVE METABOLIC PANEL
AST: 80 U/L — ABNORMAL HIGH (ref 0–37)
Albumin: 3.3 g/dL — ABNORMAL LOW (ref 3.5–5.2)
BUN: 5 mg/dL — ABNORMAL LOW (ref 6–23)
Calcium: 8.4 mg/dL (ref 8.4–10.5)
Chloride: 100 mEq/L (ref 96–112)
Creatinine, Ser: 0.75 mg/dL (ref 0.50–1.35)
Total Bilirubin: 0.8 mg/dL (ref 0.3–1.2)

## 2011-12-08 LAB — RAPID URINE DRUG SCREEN, HOSP PERFORMED
Amphetamines: NOT DETECTED
Cocaine: POSITIVE — AB
Opiates: NOT DETECTED

## 2011-12-08 LAB — ETHANOL: Alcohol, Ethyl (B): 328 mg/dL — ABNORMAL HIGH (ref 0–11)

## 2011-12-08 MED ORDER — POTASSIUM CHLORIDE CRYS ER 20 MEQ PO TBCR
40.0000 meq | EXTENDED_RELEASE_TABLET | Freq: Once | ORAL | Status: AC
  Administered 2011-12-08: 40 meq via ORAL
  Filled 2011-12-08: qty 2

## 2011-12-08 MED ORDER — ACETAMINOPHEN 325 MG PO TABS: 650.0000 mg | ORAL_TABLET | ORAL | Status: DC | PRN

## 2011-12-08 MED ORDER — LORAZEPAM 1 MG PO TABS
1.0000 mg | ORAL_TABLET | Freq: Three times a day (TID) | ORAL | Status: DC | PRN
  Administered 2011-12-09: 1 mg via ORAL
  Filled 2011-12-08: qty 1

## 2011-12-08 NOTE — Progress Notes (Signed)
CSW attempted to assess patient, however due to pt high bac level and high cbg's, csw/act will wait to assess patient when more medically stable and sober.   Mitchell [name]  [phone] 12/08/2011 9:32pm

## 2011-12-08 NOTE — ED Notes (Signed)
Spanish speaking pt, limited English speaking, presents for medical clearance, SI no plan, alcohol abuse.  Lost job 3 mos ago due to issues with drinking.  Pt agitated at home.  [employer] called to intervene, brought pt in for eval.  Calm & cooperative at present.
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
## 2011-12-09 MED ORDER — RISPERIDONE 0.5 MG PO TABS: 0.5000 mg | ORAL_TABLET | Freq: Every day | ORAL | Status: DC

## 2011-12-09 NOTE — ED Provider Notes (Addendum)
Filed Vitals:   12/09/11 0628  BP: 138/75  Pulse: 91  Temp: 98.5 F (36.9 C)  Resp: 20   Pt seen and assessed. NAD this am. Here for etoh abuse and SI. Pending placement.  Raeford Razor, MD 12/09/11 0750  Dilated by psychiatry. This consultation report was reviewed. They feel that patient safe for discharge at this time. Prescription provided for Risperdal per the recommendations. Outpatient resources provided.  Raeford Razor, MD 12/09/11 [phone]

## 2011-12-09 NOTE — ED Notes (Signed)
Pt awake, standing in doorway, denies any complaints, denies needing anything

## 2011-12-09 NOTE — ED Notes (Signed)
Pt discharged home after telepsych.  Instructions reviewed.  Pt requested bus pass after taking him to cashier's window.  Notified CSW and will try to get this for pt.  He also was given script for Risperdal and resources of homeless shelters per consult with Dr. Berlin Hun of [facility].

## 2011-12-10 ENCOUNTER — Encounter (HOSPITAL_COMMUNITY): Payer: Self-pay | Admitting: Emergency Medicine

## 2011-12-10 ENCOUNTER — Ambulatory Visit (HOSPITAL_COMMUNITY)
Admission: RE | Admit: 2011-12-10 | Discharge: 2011-12-10 | Disposition: A | Discharge status: Home / Self Care | Payer: Self-pay | Attending: Psychiatry | Admitting: Psychiatry

## 2011-12-10 ENCOUNTER — Emergency Department (HOSPITAL_COMMUNITY)
Admission: EM | Admit: 2011-12-10 | Discharge: 2011-12-10 | Disposition: A | Discharge status: Home / Self Care | Payer: Self-pay | Attending: Emergency Medicine | Admitting: Emergency Medicine

## 2011-12-10 ENCOUNTER — Emergency Department (HOSPITAL_COMMUNITY): Payer: Self-pay

## 2011-12-10 DIAGNOSIS — Z79899 Other long term (current) drug therapy: Secondary | ICD-10-CM | POA: Insufficient documentation

## 2011-12-10 DIAGNOSIS — Z87828 Personal history of other (healed) physical injury and trauma: Secondary | ICD-10-CM | POA: Insufficient documentation

## 2011-12-10 DIAGNOSIS — L97509 Non-pressure chronic ulcer of other part of unspecified foot with unspecified severity: Secondary | ICD-10-CM | POA: Insufficient documentation

## 2011-12-10 DIAGNOSIS — E11621 Type 2 diabetes mellitus with foot ulcer: Secondary | ICD-10-CM

## 2011-12-10 DIAGNOSIS — F3289 Other specified depressive episodes: Secondary | ICD-10-CM | POA: Insufficient documentation

## 2011-12-10 DIAGNOSIS — Z91199 Patient's noncompliance with other medical treatment and regimen due to unspecified reason: Secondary | ICD-10-CM | POA: Insufficient documentation

## 2011-12-10 DIAGNOSIS — F29 Unspecified psychosis not due to a substance or known physiological condition: Secondary | ICD-10-CM | POA: Insufficient documentation

## 2011-12-10 DIAGNOSIS — F329 Major depressive disorder, single episode, unspecified: Secondary | ICD-10-CM | POA: Insufficient documentation

## 2011-12-10 DIAGNOSIS — Z8619 Personal history of other infectious and parasitic diseases: Secondary | ICD-10-CM | POA: Insufficient documentation

## 2011-12-10 DIAGNOSIS — F1020 Alcohol dependence, uncomplicated: Secondary | ICD-10-CM | POA: Insufficient documentation

## 2011-12-10 DIAGNOSIS — E1149 Type 2 diabetes mellitus with other diabetic neurological complication: Secondary | ICD-10-CM | POA: Insufficient documentation

## 2011-12-10 DIAGNOSIS — Z9119 Patient's noncompliance with other medical treatment and regimen: Secondary | ICD-10-CM | POA: Insufficient documentation

## 2011-12-10 DIAGNOSIS — Z8719 Personal history of other diseases of the digestive system: Secondary | ICD-10-CM | POA: Insufficient documentation

## 2011-12-10 DIAGNOSIS — F489 Nonpsychotic mental disorder, unspecified: Secondary | ICD-10-CM | POA: Insufficient documentation

## 2011-12-10 LAB — COMPREHENSIVE METABOLIC PANEL
CO2: 23 mEq/L (ref 19–32)
Calcium: 8.5 mg/dL (ref 8.4–10.5)
Creatinine, Ser: 0.74 mg/dL (ref 0.50–1.35)
GFR calc Af Amer: >90 mL/min (ref >90)
GFR calc non Af Amer: >90 mL/min (ref >90)
Glucose, Bld: 148 mg/dL — ABNORMAL HIGH (ref 70–99)
Total Protein: 8 g/dL (ref 6.0–8.3)

## 2011-12-10 LAB — URINALYSIS, ROUTINE W REFLEX MICROSCOPIC
Bilirubin Urine: NEGATIVE
Glucose, UA: NEGATIVE mg/dL
Specific Gravity, Urine: 1.004 — ABNORMAL LOW (ref 1.005–1.030)
pH: 6.5 (ref 5.0–8.0)

## 2011-12-10 LAB — CBC
Hemoglobin: 14.9 g/dL (ref 13.0–17.0)
MCH: 33.7 pg (ref 26.0–34.0)
MCHC: 37.4 g/dL — ABNORMAL HIGH (ref 30.0–36.0)
MCV: 88.5 fL (ref 78.0–100.0)
Platelets: 125 10*3/uL — ABNORMAL LOW (ref 150–400)
RBC: 4.42 MIL/uL (ref 4.22–5.81)

## 2011-12-10 LAB — RAPID URINE DRUG SCREEN, HOSP PERFORMED
Cocaine: NOT DETECTED
Opiates: NOT DETECTED

## 2011-12-10 LAB — URINE MICROSCOPIC-ADD ON

## 2011-12-10 MED ORDER — KETOROLAC TROMETHAMINE 30 MG/ML IJ SOLN
30.0000 mg | Freq: Once | INTRAMUSCULAR | Status: AC
  Administered 2011-12-10: 30 mg via INTRAMUSCULAR
  Filled 2011-12-10: qty 1
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
## 2011-12-10 NOTE — ED Notes (Signed)
Discharge instructions reviewed with patient at assistance of translator, [place], NT.

## 2011-12-10 NOTE — ED Provider Notes (Signed)
Medical screening examination/treatment/procedure(s) were performed by non-physician practitioner and as supervising physician I was immediately available for consultation/collaboration.  Gerhard Munch, MD 12/10/11 [phone]

## 2011-12-10 NOTE — ED Notes (Addendum)
Patient states that he has recently stopped working due to the wound on his right foot. States that his pain is unbearable and that he wants to take his own life, that he has been dealing with the pain for 18 years. Patient states that his father recently passed away and he had not seen him in 16 years and that he feels guilty for not being there for him. His mother is also having health issues and he states he cannot see her, she is in [place]. States that in his culture men are supposed to provide for their household and he feels useless that he is unable to do that for his family and kids at this time. Pt is crying at this point.   Roel Cluck, NT

## 2011-12-10 NOTE — ED Notes (Addendum)
Patient in [place] A Bed. Patient speaks little english. [place], NT/Sitter at bedside to translate. Patient states his foot hurts. No other c/o pain. Patient in blue scrubs. Patient cooperative but tearful. States his foot is in "A lot" of pain.
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
## 2011-12-10 NOTE — ED Notes (Signed)
Pt from [facility] to ED for medical clearance. Pt depressed expresses suicidal thoughts, reports auditory hallucinations, and abusing alcohol per [facility]. Pt speaks spanish. Pt denies suicidal thoughts at the moment.

## 2011-12-14 ENCOUNTER — Inpatient Hospital Stay (HOSPITAL_COMMUNITY): Payer: Medicaid Other

## 2011-12-14 ENCOUNTER — Emergency Department (HOSPITAL_COMMUNITY): Payer: Medicaid Other

## 2011-12-14 ENCOUNTER — Encounter (HOSPITAL_COMMUNITY): Payer: Self-pay | Admitting: *Deleted

## 2011-12-14 ENCOUNTER — Inpatient Hospital Stay (HOSPITAL_COMMUNITY)
Admission: EM | Admit: 2011-12-14 | Discharge: 2011-12-20 | DRG: 854 | Disposition: A | Discharge status: Home / Self Care | Payer: Medicaid Other | Attending: Family Medicine | Admitting: Family Medicine

## 2011-12-14 ENCOUNTER — Ambulatory Visit: Payer: Self-pay | Admitting: Family Medicine

## 2011-12-14 ENCOUNTER — Telehealth: Payer: Self-pay | Admitting: *Deleted

## 2011-12-14 DIAGNOSIS — M869 Osteomyelitis, unspecified: Secondary | ICD-10-CM | POA: Diagnosis present

## 2011-12-14 DIAGNOSIS — F329 Major depressive disorder, single episode, unspecified: Secondary | ICD-10-CM | POA: Diagnosis present

## 2011-12-14 DIAGNOSIS — R443 Hallucinations, unspecified: Secondary | ICD-10-CM | POA: Diagnosis present

## 2011-12-14 DIAGNOSIS — F1994 Other psychoactive substance use, unspecified with psychoactive substance-induced mood disorder: Secondary | ICD-10-CM

## 2011-12-14 DIAGNOSIS — E1169 Type 2 diabetes mellitus with other specified complication: Secondary | ICD-10-CM | POA: Diagnosis present

## 2011-12-14 DIAGNOSIS — R45851 Suicidal ideations: Secondary | ICD-10-CM

## 2011-12-14 DIAGNOSIS — B3749 Other urogenital candidiasis: Secondary | ICD-10-CM | POA: Diagnosis present

## 2011-12-14 DIAGNOSIS — F1020 Alcohol dependence, uncomplicated: Secondary | ICD-10-CM | POA: Diagnosis present

## 2011-12-14 DIAGNOSIS — L03119 Cellulitis of unspecified part of limb: Secondary | ICD-10-CM | POA: Diagnosis present

## 2011-12-14 DIAGNOSIS — L02419 Cutaneous abscess of limb, unspecified: Secondary | ICD-10-CM | POA: Diagnosis present

## 2011-12-14 DIAGNOSIS — Z833 Family history of diabetes mellitus: Secondary | ICD-10-CM

## 2011-12-14 DIAGNOSIS — F1010 Alcohol abuse, uncomplicated: Secondary | ICD-10-CM

## 2011-12-14 DIAGNOSIS — K219 Gastro-esophageal reflux disease without esophagitis: Secondary | ICD-10-CM | POA: Diagnosis present

## 2011-12-14 DIAGNOSIS — L0390 Cellulitis, unspecified: Secondary | ICD-10-CM

## 2011-12-14 DIAGNOSIS — A419 Sepsis, unspecified organism: Principal | ICD-10-CM | POA: Diagnosis present

## 2011-12-14 DIAGNOSIS — E119 Type 2 diabetes mellitus without complications: Secondary | ICD-10-CM | POA: Diagnosis present

## 2011-12-14 DIAGNOSIS — L03115 Cellulitis of right lower limb: Secondary | ICD-10-CM | POA: Diagnosis present

## 2011-12-14 DIAGNOSIS — M8610 Other acute osteomyelitis, unspecified site: Secondary | ICD-10-CM

## 2011-12-14 DIAGNOSIS — M549 Dorsalgia, unspecified: Secondary | ICD-10-CM | POA: Diagnosis present

## 2011-12-14 DIAGNOSIS — E118 Type 2 diabetes mellitus with unspecified complications: Secondary | ICD-10-CM | POA: Diagnosis present

## 2011-12-14 DIAGNOSIS — F32A Depression, unspecified: Secondary | ICD-10-CM | POA: Diagnosis present

## 2011-12-14 DIAGNOSIS — R109 Unspecified abdominal pain: Secondary | ICD-10-CM | POA: Diagnosis present

## 2011-12-14 DIAGNOSIS — K047 Periapical abscess without sinus: Secondary | ICD-10-CM

## 2011-12-14 DIAGNOSIS — M899 Disorder of bone, unspecified: Secondary | ICD-10-CM | POA: Diagnosis present

## 2011-12-14 DIAGNOSIS — F4325 Adjustment disorder with mixed disturbance of emotions and conduct: Secondary | ICD-10-CM | POA: Diagnosis present

## 2011-12-14 DIAGNOSIS — K7010 Alcoholic hepatitis without ascites: Secondary | ICD-10-CM | POA: Diagnosis present

## 2011-12-14 DIAGNOSIS — B353 Tinea pedis: Secondary | ICD-10-CM

## 2011-12-14 DIAGNOSIS — L97509 Non-pressure chronic ulcer of other part of unspecified foot with unspecified severity: Secondary | ICD-10-CM | POA: Diagnosis present

## 2011-12-14 DIAGNOSIS — M9080 Osteopathy in diseases classified elsewhere, unspecified site: Secondary | ICD-10-CM | POA: Diagnosis present

## 2011-12-14 HISTORY — DX: Depression, unspecified: F32.A

## 2011-12-14 HISTORY — DX: Major depressive disorder, single episode, unspecified: F32.9

## 2011-12-14 HISTORY — DX: Type 2 diabetes mellitus without complications: E11.9

## 2011-12-14 LAB — URINALYSIS, ROUTINE W REFLEX MICROSCOPIC
Glucose, UA: 500 mg/dL — AB
Ketones, ur: NEGATIVE mg/dL
Leukocytes, UA: NEGATIVE
Nitrite: NEGATIVE
Protein, ur: 100 mg/dL — AB
Urobilinogen, UA: 1 mg/dL (ref 0.0–1.0)

## 2011-12-14 LAB — RAPID URINE DRUG SCREEN, HOSP PERFORMED
Amphetamines: NOT DETECTED
Barbiturates: NOT DETECTED
Benzodiazepines: NOT DETECTED
Tetrahydrocannabinol: NOT DETECTED

## 2011-12-14 LAB — CBC
HCT: 35.2 % — ABNORMAL LOW (ref 39.0–52.0)
MCHC: 36.7 g/dL — ABNORMAL HIGH (ref 30.0–36.0)
MCHC: 36.6 g/dL — ABNORMAL HIGH (ref 30.0–36.0)
Platelets: 99 10*3/uL — ABNORMAL LOW (ref 150–400)
RDW: 14.1 % (ref 11.5–15.5)
WBC: 9.2 10*3/uL (ref 4.0–10.5)
WBC: 9.3 10*3/uL (ref 4.0–10.5)

## 2011-12-14 LAB — COMPREHENSIVE METABOLIC PANEL
ALT: 73 U/L — ABNORMAL HIGH (ref 0–53)
AST: 103 U/L — ABNORMAL HIGH (ref 0–37)
Albumin: 3.1 g/dL — ABNORMAL LOW (ref 3.5–5.2)
Alkaline Phosphatase: 125 U/L — ABNORMAL HIGH (ref 39–117)
Chloride: 105 mEq/L (ref 96–112)
Potassium: 3.1 mEq/L — ABNORMAL LOW (ref 3.5–5.1)
Sodium: 141 mEq/L (ref 135–145)
Total Protein: 8.2 g/dL (ref 6.0–8.3)

## 2011-12-14 LAB — LIPASE, BLOOD: Lipase: 44 U/L (ref 11–59)

## 2011-12-14 LAB — GLUCOSE, CAPILLARY

## 2011-12-14 LAB — URINE MICROSCOPIC-ADD ON

## 2011-12-14 MED ORDER — MAGNESIUM OXIDE 400 (241.3 MG) MG PO TABS
400.0000 mg | ORAL_TABLET | Freq: Two times a day (BID) | ORAL | Status: DC
  Administered 2011-12-14 – 2011-12-19 (×10): 400 mg via ORAL
  Filled 2011-12-14 (×13): qty 1

## 2011-12-14 MED ORDER — ACETAMINOPHEN 650 MG RE SUPP: 650.0000 mg | Freq: Four times a day (QID) | RECTAL | Status: DC | PRN

## 2011-12-14 MED ORDER — PANTOPRAZOLE SODIUM 20 MG PO TBEC
20.0000 mg | DELAYED_RELEASE_TABLET | Freq: Every day | ORAL | Status: DC
  Administered 2011-12-14 – 2011-12-20 (×6): 20 mg via ORAL
  Filled 2011-12-14 (×7): qty 1

## 2011-12-14 MED ORDER — VITAMIN B-1 100 MG PO TABS
100.0000 mg | ORAL_TABLET | Freq: Every day | ORAL | Status: DC
  Administered 2011-12-14 – 2011-12-19 (×5): 100 mg via ORAL
  Filled 2011-12-14 (×7): qty 1

## 2011-12-14 MED ORDER — SODIUM CHLORIDE 0.9 % IV BOLUS (SEPSIS)
1000.0000 mL | Freq: Once | INTRAVENOUS | Status: AC
  Administered 2011-12-14: 1000 mL via INTRAVENOUS

## 2011-12-14 MED ORDER — HYDROCODONE-ACETAMINOPHEN 5-325 MG PO TABS: 1.0000 | ORAL_TABLET | ORAL | Status: DC | PRN

## 2011-12-14 MED ORDER — ENOXAPARIN SODIUM 40 MG/0.4ML ~~LOC~~ SOLN
40.0000 mg | SUBCUTANEOUS | Status: DC
  Administered 2011-12-14 – 2011-12-19 (×6): 40 mg via SUBCUTANEOUS
  Filled 2011-12-14 (×7): qty 0.4

## 2011-12-14 MED ORDER — LORAZEPAM 1 MG PO TABS: 1.0000 mg | ORAL_TABLET | Freq: Four times a day (QID) | ORAL | Status: AC | PRN

## 2011-12-14 MED ORDER — TRAZODONE HCL 50 MG PO TABS
50.0000 mg | ORAL_TABLET | Freq: Every day | ORAL | Status: DC
  Administered 2011-12-14 – 2011-12-19 (×6): 50 mg via ORAL
  Filled 2011-12-14 (×7): qty 2

## 2011-12-14 MED ORDER — DIPHENHYDRAMINE HCL 50 MG/ML IJ SOLN
25.0000 mg | Freq: Once | INTRAMUSCULAR | Status: AC
  Administered 2011-12-14: 25 mg via INTRAVENOUS

## 2011-12-14 MED ORDER — INSULIN ASPART 100 UNIT/ML ~~LOC~~ SOLN
0.0000 [IU] | Freq: Every day | SUBCUTANEOUS | Status: DC
  Administered 2011-12-15 – 2011-12-19 (×3): 2 [IU] via SUBCUTANEOUS

## 2011-12-14 MED ORDER — LORAZEPAM 2 MG/ML IJ SOLN: 1.0000 mg | Freq: Four times a day (QID) | INTRAMUSCULAR | Status: AC | PRN

## 2011-12-14 MED ORDER — THIAMINE HCL 100 MG/ML IJ SOLN
Freq: Once | INTRAVENOUS | Status: AC
  Administered 2011-12-14: 20:00:00 via INTRAVENOUS
  Filled 2011-12-14 (×2): qty 1000

## 2011-12-14 MED ORDER — CIPROFLOXACIN IN D5W 400 MG/200ML IV SOLN
400.0000 mg | Freq: Once | INTRAVENOUS | Status: AC
  Administered 2011-12-14: 400 mg via INTRAVENOUS
  Filled 2011-12-14: qty 200

## 2011-12-14 MED ORDER — FOLIC ACID 1 MG PO TABS
1.0000 mg | ORAL_TABLET | Freq: Every day | ORAL | Status: DC
  Administered 2011-12-14 – 2011-12-19 (×5): 1 mg via ORAL
  Filled 2011-12-14 (×7): qty 1

## 2011-12-14 MED ORDER — IBUPROFEN 400 MG PO TABS
400.0000 mg | ORAL_TABLET | ORAL | Status: DC | PRN
  Administered 2011-12-14 – 2011-12-19 (×6): 400 mg via ORAL
  Filled 2011-12-14 (×7): qty 1

## 2011-12-14 MED ORDER — THIAMINE HCL 100 MG/ML IJ SOLN
100.0000 mg | Freq: Every day | INTRAMUSCULAR | Status: DC
  Administered 2011-12-14: 100 mg via INTRAVENOUS
  Filled 2011-12-14 (×7): qty 1

## 2011-12-14 MED ORDER — ADULT MULTIVITAMIN W/MINERALS CH: 1.0000 | ORAL_TABLET | Freq: Every day | ORAL | Status: DC

## 2011-12-14 MED ORDER — CITALOPRAM HYDROBROMIDE 40 MG PO TABS
40.0000 mg | ORAL_TABLET | Freq: Every day | ORAL | Status: DC
  Administered 2011-12-14 – 2011-12-20 (×6): 40 mg via ORAL
  Filled 2011-12-14 (×7): qty 1

## 2011-12-14 MED ORDER — VANCOMYCIN HCL IN DEXTROSE 1-5 GM/200ML-% IV SOLN
1000.0000 mg | Freq: Once | INTRAVENOUS | Status: AC
  Administered 2011-12-14: 1000 mg via INTRAVENOUS
  Filled 2011-12-14: qty 200

## 2011-12-14 MED ORDER — PIPERACILLIN-TAZOBACTAM 3.375 G IVPB
3.3750 g | Freq: Three times a day (TID) | INTRAVENOUS | Status: DC
  Administered 2011-12-14 – 2011-12-19 (×14): 3.375 g via INTRAVENOUS
  Filled 2011-12-14 (×18): qty 50

## 2011-12-14 MED ORDER — INSULIN ASPART 100 UNIT/ML ~~LOC~~ SOLN
0.0000 [IU] | Freq: Three times a day (TID) | SUBCUTANEOUS | Status: DC
  Administered 2011-12-15 – 2011-12-16 (×2): 1 [IU] via SUBCUTANEOUS
  Administered 2011-12-16: 2 [IU] via SUBCUTANEOUS
  Administered 2011-12-17: 1 [IU] via SUBCUTANEOUS
  Administered 2011-12-18: 2 [IU] via SUBCUTANEOUS
  Administered 2011-12-19: 1 [IU] via SUBCUTANEOUS
  Administered 2011-12-19: 3 [IU] via SUBCUTANEOUS
  Administered 2011-12-20 (×2): 1 [IU] via SUBCUTANEOUS

## 2011-12-14 MED ORDER — DIPHENHYDRAMINE HCL 50 MG/ML IJ SOLN
INTRAMUSCULAR | Status: AC
  Filled 2011-12-14: qty 1

## 2011-12-14 MED ORDER — ACETAMINOPHEN 325 MG PO TABS
650.0000 mg | ORAL_TABLET | Freq: Four times a day (QID) | ORAL | Status: DC | PRN
  Administered 2011-12-16: 650 mg via ORAL
  Filled 2011-12-14: qty 2

## 2011-12-14 MED ORDER — DEXTROSE 5 % IV SOLN
1.0000 g | INTRAVENOUS | Status: DC
  Filled 2011-12-14: qty 10

## 2011-12-14 MED ORDER — RISPERIDONE 0.5 MG PO TABS
0.5000 mg | ORAL_TABLET | Freq: Every day | ORAL | Status: DC
  Administered 2011-12-14: 0.5 mg via ORAL
  Filled 2011-12-14 (×2): qty 1

## 2011-12-14 NOTE — ED Notes (Signed)
Pat,RN at [facility] campus given report on pt. Pt will go to room 5529 via care link. Will continue to monitor until has been transferred.

## 2011-12-14 NOTE — ED Notes (Signed)
Pt itching; MD notified and gave orders for benadryl IV. Will continue to monitor.

## 2011-12-14 NOTE — ED Notes (Signed)
PATIENT UNABLE TO VOID AT THIS TIME

## 2011-12-14 NOTE — ED Notes (Signed)
Rafella, pt's wife, contact number: [phone].
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
## 2011-12-14 NOTE — Progress Notes (Signed)
1830 Patient arrived to floor from [facility] ED via Care Link . Dr. Fara Boros made aware patient is nontelemetry per MD. Right foot redden, swollen and tender to touch. Sitter at bedside.  1845 Dr. Fara Boros in to assess patient. Patient speaks very little [place].
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
## 2011-12-15 ENCOUNTER — Encounter (HOSPITAL_COMMUNITY): Payer: Self-pay | Admitting: Certified Registered Nurse Anesthetist

## 2011-12-15 ENCOUNTER — Inpatient Hospital Stay (HOSPITAL_COMMUNITY): Payer: Medicaid Other

## 2011-12-15 ENCOUNTER — Encounter (HOSPITAL_COMMUNITY): Payer: Self-pay | Admitting: General Practice

## 2011-12-15 ENCOUNTER — Encounter (HOSPITAL_COMMUNITY)
Admission: EM | Disposition: A | Discharge status: Home / Self Care | Payer: Self-pay | Source: Home / Self Care | Attending: Family Medicine

## 2011-12-15 DIAGNOSIS — R45851 Suicidal ideations: Secondary | ICD-10-CM

## 2011-12-15 DIAGNOSIS — R109 Unspecified abdominal pain: Secondary | ICD-10-CM | POA: Diagnosis present

## 2011-12-15 DIAGNOSIS — B3749 Other urogenital candidiasis: Secondary | ICD-10-CM | POA: Diagnosis present

## 2011-12-15 DIAGNOSIS — L03115 Cellulitis of right lower limb: Secondary | ICD-10-CM | POA: Diagnosis present

## 2011-12-15 DIAGNOSIS — F329 Major depressive disorder, single episode, unspecified: Secondary | ICD-10-CM | POA: Diagnosis present

## 2011-12-15 LAB — COMPREHENSIVE METABOLIC PANEL
ALT: 39 U/L (ref 0–53)
Alkaline Phosphatase: 87 U/L (ref 39–117)
CO2: 28 mEq/L (ref 19–32)
Chloride: 105 mEq/L (ref 96–112)
GFR calc Af Amer: >90 mL/min (ref >90)
GFR calc non Af Amer: >90 mL/min (ref >90)
Glucose, Bld: 150 mg/dL — ABNORMAL HIGH (ref 70–99)
Potassium: 3 mEq/L — ABNORMAL LOW (ref 3.5–5.1)
Sodium: 139 mEq/L (ref 135–145)
Total Bilirubin: 1 mg/dL (ref 0.3–1.2)

## 2011-12-15 LAB — CBC WITH DIFFERENTIAL/PLATELET
HCT: 34.9 % — ABNORMAL LOW (ref 39.0–52.0)
Hemoglobin: 12.7 g/dL — ABNORMAL LOW (ref 13.0–17.0)
Lymphocytes Relative: 20 % (ref 12–46)
Lymphs Abs: 1 10*3/uL (ref 0.7–4.0)
MCHC: 36.4 g/dL — ABNORMAL HIGH (ref 30.0–36.0)
Monocytes Absolute: 0.6 10*3/uL (ref 0.1–1.0)
Monocytes Relative: 12 % (ref 3–12)
Neutro Abs: 3.6 10*3/uL (ref 1.7–7.7)
Neutrophils Relative %: 68 % (ref 43–77)
RBC: 3.85 MIL/uL — ABNORMAL LOW (ref 4.22–5.81)

## 2011-12-15 LAB — SURGICAL PCR SCREEN
MRSA, PCR: NEGATIVE
Staphylococcus aureus: NEGATIVE

## 2011-12-15 LAB — URINE MICROSCOPIC-ADD ON

## 2011-12-15 LAB — URINALYSIS, ROUTINE W REFLEX MICROSCOPIC
Glucose, UA: NEGATIVE mg/dL
Ketones, ur: 15 mg/dL — AB
Protein, ur: 100 mg/dL — AB

## 2011-12-15 LAB — GLUCOSE, CAPILLARY
Glucose-Capillary: 143 mg/dL — ABNORMAL HIGH (ref 70–99)
Glucose-Capillary: 118 mg/dL — ABNORMAL HIGH (ref 70–99)
Glucose-Capillary: 204 mg/dL — ABNORMAL HIGH (ref 70–99)

## 2011-12-15 LAB — HEMOGLOBIN A1C: Mean Plasma Glucose: 151 mg/dL — ABNORMAL HIGH (ref <117)

## 2011-12-15 SURGERY — CANCELLED PROCEDURE
Laterality: Right

## 2011-12-15 MED ORDER — POTASSIUM CHLORIDE CRYS ER 20 MEQ PO TBCR
20.0000 meq | EXTENDED_RELEASE_TABLET | Freq: Two times a day (BID) | ORAL | Status: AC
  Administered 2011-12-15 (×2): 20 meq via ORAL
  Filled 2011-12-15 (×2): qty 1

## 2011-12-15 MED ORDER — FLUCONAZOLE 150 MG PO TABS
150.0000 mg | ORAL_TABLET | Freq: Once | ORAL | Status: AC
  Administered 2011-12-15: 150 mg via ORAL
  Filled 2011-12-15: qty 1

## 2011-12-15 MED ORDER — RISPERIDONE 2 MG PO TBDP
2.0000 mg | ORAL_TABLET | Freq: Every day | ORAL | Status: DC | PRN
  Administered 2011-12-16: 2 mg via ORAL
  Filled 2011-12-15: qty 1

## 2011-12-15 MED ORDER — VANCOMYCIN HCL 1000 MG IV SOLR
750.0000 mg | Freq: Three times a day (TID) | INTRAVENOUS | Status: DC
  Administered 2011-12-15 – 2011-12-19 (×12): 750 mg via INTRAVENOUS
  Filled 2011-12-15 (×15): qty 750

## 2011-12-15 SURGICAL SUPPLY — 66 items
BANDAGE CONFORM 3  STR LF (GAUZE/BANDAGES/DRESSINGS) IMPLANT
BANDAGE ELASTIC 3 VELCRO ST LF (GAUZE/BANDAGES/DRESSINGS) IMPLANT
BANDAGE GAUZE 4  KLING STR (GAUZE/BANDAGES/DRESSINGS) IMPLANT
BLADE AVERAGE 25X9 (BLADE) IMPLANT
BLADE MINI RND TIP GREEN BEAV (BLADE) IMPLANT
BLADE SURG 10 STRL SS (BLADE) ×2 IMPLANT
BNDG COHESIVE 1X5 TAN STRL LF (GAUZE/BANDAGES/DRESSINGS) IMPLANT
BNDG COHESIVE 4X5 TAN STRL (GAUZE/BANDAGES/DRESSINGS) ×2 IMPLANT
BNDG COHESIVE 6X5 TAN STRL LF (GAUZE/BANDAGES/DRESSINGS) ×4 IMPLANT
BNDG ESMARK 4X9 LF (GAUZE/BANDAGES/DRESSINGS) ×2 IMPLANT
BNDG GAUZE STRTCH 6 (GAUZE/BANDAGES/DRESSINGS) ×6 IMPLANT
CLOTH BEACON ORANGE TIMEOUT ST (SAFETY) ×2 IMPLANT
CORDS BIPOLAR (ELECTRODE) ×2 IMPLANT
COVER SURGICAL LIGHT HANDLE (MISCELLANEOUS) ×2 IMPLANT
CUFF TOURNIQUET SINGLE 18IN (TOURNIQUET CUFF) ×2 IMPLANT
CUFF TOURNIQUET SINGLE 24IN (TOURNIQUET CUFF) IMPLANT
CUFF TOURNIQUET SINGLE 34IN LL (TOURNIQUET CUFF) IMPLANT
CUFF TOURNIQUET SINGLE 44IN (TOURNIQUET CUFF) IMPLANT
DRAPE ORTHO SPLIT 77X108 STRL (DRAPES) ×2
DRAPE SURG 17X23 STRL (DRAPES) IMPLANT
DRAPE SURG ORHT 6 SPLT 77X108 (DRAPES) ×2 IMPLANT
DRAPE U-SHAPE 47X51 STRL (DRAPES) ×2 IMPLANT
DURAPREP 26ML APPLICATOR (WOUND CARE) ×2 IMPLANT
ELECT CAUTERY BLADE 6.4 (BLADE) IMPLANT
ELECT REM PT RETURN 9FT ADLT (ELECTROSURGICAL) ×2
ELECTRODE REM PT RTRN 9FT ADLT (ELECTROSURGICAL) ×1 IMPLANT
GAUZE SPONGE 2X2 8PLY STRL LF (GAUZE/BANDAGES/DRESSINGS) IMPLANT
GAUZE XEROFORM 1X8 LF (GAUZE/BANDAGES/DRESSINGS) ×2 IMPLANT
GLOVE BIOGEL PI IND STRL 8 (GLOVE) ×2 IMPLANT
GLOVE BIOGEL PI INDICATOR 8 (GLOVE) ×2
GLOVE ORTHO TXT STRL SZ7.5 (GLOVE) ×2 IMPLANT
GOWN PREVENTION PLUS LG XLONG (DISPOSABLE) IMPLANT
GOWN PREVENTION PLUS XLARGE (GOWN DISPOSABLE) ×2 IMPLANT
GOWN STRL NON-REIN LRG LVL3 (GOWN DISPOSABLE) ×2 IMPLANT
HANDPIECE INTERPULSE COAX TIP (DISPOSABLE)
KIT BASIN OR (CUSTOM PROCEDURE TRAY) ×2 IMPLANT
KIT ROOM TURNOVER OR (KITS) ×2 IMPLANT
MANIFOLD NEPTUNE II (INSTRUMENTS) ×2 IMPLANT
NEEDLE HYPO 25GX1X1/2 BEV (NEEDLE) IMPLANT
NS IRRIG 1000ML POUR BTL (IV SOLUTION) ×2 IMPLANT
PACK ORTHO EXTREMITY (CUSTOM PROCEDURE TRAY) ×2 IMPLANT
PAD ARMBOARD 7.5X6 YLW CONV (MISCELLANEOUS) ×4 IMPLANT
PAD CAST 4YDX4 CTTN HI CHSV (CAST SUPPLIES) IMPLANT
PADDING CAST ABS 4INX4YD NS (CAST SUPPLIES) ×2
PADDING CAST ABS COTTON 4X4 ST (CAST SUPPLIES) ×2 IMPLANT
PADDING CAST COTTON 4X4 STRL (CAST SUPPLIES)
PADDING CAST COTTON 6X4 STRL (CAST SUPPLIES) ×2 IMPLANT
SET HNDPC FAN SPRY TIP SCT (DISPOSABLE) IMPLANT
SPECIMEN JAR SMALL (MISCELLANEOUS) ×2 IMPLANT
SPONGE GAUZE 2X2 STER 10/PKG (GAUZE/BANDAGES/DRESSINGS)
SPONGE GAUZE 4X4 12PLY (GAUZE/BANDAGES/DRESSINGS) ×2 IMPLANT
SPONGE LAP 18X18 X RAY DECT (DISPOSABLE) ×2 IMPLANT
STOCKINETTE IMPERVIOUS 9X36 MD (GAUZE/BANDAGES/DRESSINGS) ×2 IMPLANT
SUCTION FRAZIER TIP 10 FR DISP (SUCTIONS) IMPLANT
SUT ETHILON 2 0 FS 18 (SUTURE) ×6 IMPLANT
SUT ETHILON 3 0 PS 1 (SUTURE) ×4 IMPLANT
SUT VIC AB 2-0 FS1 27 (SUTURE) IMPLANT
SYR CONTROL 10ML LL (SYRINGE) IMPLANT
TOWEL OR 17X24 6PK STRL BLUE (TOWEL DISPOSABLE) ×2 IMPLANT
TOWEL OR 17X26 10 PK STRL BLUE (TOWEL DISPOSABLE) ×2 IMPLANT
TUBE ANAEROBIC SPECIMEN COL (MISCELLANEOUS) IMPLANT
TUBE CONNECTING 12X1/4 (SUCTIONS) ×2 IMPLANT
TUBE FEEDING 5FR 15 INCH (TUBING) IMPLANT
UNDERPAD 30X30 INCONTINENT (UNDERPADS AND DIAPERS) ×2 IMPLANT
WATER STERILE IRR 1000ML POUR (IV SOLUTION) ×2 IMPLANT
YANKAUER SUCT BULB TIP NO VENT (SUCTIONS) ×2 IMPLANT

## 2011-12-15 NOTE — Progress Notes (Signed)
Interpreter Mitchell [name] for Dr Konkol 
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
## 2011-12-15 NOTE — Progress Notes (Signed)
Patient ID: Oswell Brackin, male   DOB: 04/13/1975, 36 y.o.   MRN: [id number] S: Received a call from the nurse, during the patients MRI his IV became disconnected and the banana bag infusion was reconnected off pump and directly to IV site. The patient therefore received the entire liter at gravity rate. The nurse reports the patient is stable and having no complications.   O: BP 112/70  Pulse 92  Temp 100.2 F (37.9 C) (Oral)  Resp 20  Ht 5\' 4"  (1.626 m)  Wt 153 lb 6.4 oz (69.582 kg)  BMI 26.33 kg/m2  SpO2 96%  A/P: - Pharmacy contacted to discuss possible side effects of event  - Not likely to have any negative effect on patient  - Small chance of allergic reactions, however patient has been on this without reactions prior to               event. Therefore, reaction is unlikely. - Advised Nurse to monitor for allergic reactions such as hives, shortness of breath, angioedema and to call immediately if complications would arise.

## 2011-12-15 NOTE — Progress Notes (Signed)
Interpreter Wyvonnia Dusky for [employer] CSW and [employer]

## 2011-12-15 NOTE — Discharge Summary (Signed)
Physician Discharge Summary  Patient ID: Mitchell [name] MRN: [id number] DOB/AGE: 29-May-1975 36 y.o.  Admit date: 12/14/2011 Discharge date: 12/21/2011  Admission Diagnoses: Diabetes type 2 with right foot infection  Discharge Diagnoses:  Principal Problem:  *Cellulitis of right foot Active Problems:  Osteomyelitis  Diabetes mellitus type 2 with complications  Yeast dermatitis of penis  Abdominal pain in male patient  Suicidal ideation  Depression  Diabetes mellitus   Discharged Condition: good  Hospital Course:  Patient was admitted to the hospital for sepsis infection. Upon presentation he proved to have gross hematuria for 2 months with dysuria. Diffuse  abdominal pain radiating into the back, vomit and lack of appetite for a month.  Extreme RUQ pain. He is a chronic alcoholic and has been binge drinking for 2 weeks. He has been reportedly, by wife,severely depressed and suicidal. A sitter remained in the room with him during his admission. Orthopedic surgeons consulted and performed a right ray amputation. Psychiatry consulted and made recommendations during the course of his hospital stay. An ultrasound was obtained of his abdomen and showed a moderate cirrhotic liver. A CT abd/pelvis was obtained for his chronic hematuria, resulted moderate cirrhosis of the liver and no notable etiology for his hematuria. He consistently had pancytopenia during his stay and it is felt to be contributed to his chronic history of alcoholism. If it is continued outpatient follow up may be warranted. Outpatient psychiatry treatment, with a spanish speaking individual or group is recommended on discharge and information has been given to him prior to discharge. Alcohol cessation counseling with a spanish speaking group would also be of benefit and he also has information regarding this type of counseling. He was discharged on Augmentin PO x 10 days, with a follow up with ortho and PCP arranged.    Of Note:  Patient had complaints of diarrhea during admission, however he states it had cleared just prior to discharge and refused stool sample and wanted discharged. Not certain if his explanation is accurate or he just wanted to go home. Nurse stated she had received no reports of diarrhea from him.  *An outpatient urology consult is recommended for his sustained hematuria.  Consults: psychiatry and orthopedic surgery  Significant Diagnostic Studies: CT, MRI see above  Treatments: IV zosyn and vancomycin. Home on PO Augmentin x 10 days.  Discharge Exam: Blood pressure 116/77, pulse 69, temperature 99.1 F (37.3 C), temperature source Oral, resp. rate 20, height 5\' 4"  (1.626 m), weight 155 lb 3.3 oz (70.4 kg), SpO2 99.00%. Intake/Output this shift:   Gen: NAD  HEENT: MMM, EOMI  CV: RRR no m/r/g  Abd: soft, ND. Tenderness in the RUQ.  Musc: R great toe amputated w/ dressings on place. R leg w/ LE scarring above dressings. <3 sec cap refill in toes. Dressings were left in place and are c/d/i  Psych: pleasant. Normal thought present  Neuro: CN grossly intact.  Disposition: 01-Home or Self Care      Discharge Orders    Future Appointments: Provider: Department: Dept Phone: Center:   12/29/2011 3:15 PM Brent Bulla, MD [facility] [phone] [employer]     Future Orders Please Complete By Expires   Diet - low sodium heart healthy      Increase activity slowly      Discharge instructions      Comments:   Follow up with your PCP. If you have diarrhea you have to contact your PCP   Call MD for:  temperature >100.4      Call MD for:  redness, tenderness, or signs of infection (pain, swelling, redness, odor or green/yellow discharge around incision site)          Medication List     As of 12/21/2011  9:34 AM    STOP taking these medications         ibuprofen 600 MG tablet   Commonly known as: ADVIL,MOTRIN      magnesium oxide 400 MG tablet   Commonly known as:  MAG-OX      TAKE these medications         amoxicillin-clavulanate 875-125 MG per tablet   Commonly known as: AUGMENTIN   Take 1 tablet by mouth every 12 (twelve) hours.      citalopram 40 MG tablet   Commonly known as: CELEXA   Take 40 mg by mouth daily.      glimepiride 4 MG tablet   Commonly known as: AMARYL   Take 1 tablet (4 mg total) by mouth daily before breakfast.      metFORMIN 1000 MG tablet   Commonly known as: GLUCOPHAGE   Take 1 tablet (1,000 mg total) by mouth 2 (two) times daily with a meal. For diabetes      multivitamin with minerals Tabs   Take 1 tablet by mouth daily.      pantoprazole 20 MG tablet   Commonly known as: PROTONIX   Take 1 tablet (20 mg total) by mouth daily.      risperiDONE 0.5 MG tablet   Commonly known as: RISPERDAL   Take 1 tablet (0.5 mg total) by mouth at bedtime.      traZODone 50 MG tablet   Commonly known as: DESYREL   Take 50-100 mg by mouth at bedtime.         Follow-up Information    Call [facility]. (To schedule appointment for alcohol and depression counseling)    Contact information:    [address], [place], [place] [address]    [phone]      Follow up with DUDA,MARCUS V, MD. In 2 weeks.   Contact information:   [address] [place] [address] [phone]       Follow up with [name], MD. On 12/29/2011. (@3 :15)    Contact information:   [address] [place] [address] [phone]          Signed: Felix Pacini 12/21/2011, 9:34 AM
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
## 2011-12-15 NOTE — Progress Notes (Signed)
Noted and agree.  Please see my co-sign of the H&PE for my note of today.

## 2011-12-15 NOTE — Care Management Note (Addendum)
    Page 1 of 1   12/20/2011     4:41:26 PM   CARE MANAGEMENT NOTE 12/20/2011  Patient:  Mitchell [name], Mitchell [name]   Account Number:  [id number]  Date Initiated:  12/15/2011  Documentation initiated by:  Letha Cape  Subjective/Objective Assessment:   dx cellulitis  admit- lives with girlfriend.  pta independent.     Action/Plan:   pt eval- no pt follow up.   Anticipated DC Date:  12/20/2011   Anticipated DC Plan:  HOME/SELF CARE      DC Planning Services  CM consult  MATCH Program      Choice offered to / List presented to:             Status of service:  Completed, signed off Medicare Important Message given?   (If response is "NO", the following Medicare IM given date fields will be blank) Date Medicare IM given:   Date Additional Medicare IM given:    Discharge Disposition:  HOME/SELF CARE  Per UR Regulation:  Reviewed for med. necessity/level of care/duration of stay  If discussed at Long Length of Stay Meetings, dates discussed:    Comments:  12/20/11 11:38 Letha Cape RN, BSN [phone] patient for dc today, patient has transportation and has Match Program letter, patient want to go home on metformin not insulin, will inform Md.  Patient state he has crutches at home and somone will bring them for him ,he does not want a rolling walker.Pateint will need to get meds from outpt pharmacy which closes at 5:30 pm, awaiting psych MD to clear patient.  12/15/11 18:03 Letha Cape RN, BSN [phone] patient lives with girlfriend, pta independent.  Patient has transportation at discharge.  Patient states he can afford $3 for medications for each script at discharge, he will need ast with Match Program.  NCM will continue to follow for dc needs.

## 2011-12-15 NOTE — Progress Notes (Signed)
ANTIBIOTIC CONSULT NOTE - FOLLOW UP  Pharmacy Consult for Zosyn and Vancomycin Indication: coverage for UTI, prosatitis, and septic arthritis vs osteomyelitis right foot  No Known Allergies  Patient Measurements: Height: 5\' 4"  (162.6 cm) Weight: 153 lb 6.4 oz (69.582 kg) IBW/kg (Calculated) : 59.2   Vital Signs: Temp: 98.4 F (36.9 C) (11/20 0629) Temp src: Oral (11/20 0629) BP: 104/65 mmHg (11/20 0629) Pulse Rate: 68  (11/20 0629)  Labs:  Basename 12/15/11 0615 12/14/11 1935 12/14/11 1234  WBC -- 9.3 9.2  HGB -- 12.9* 14.0  PLT -- 99* 144*  LABCREA -- -- --  CREATININE 0.83 0.74 0.72   Estimated Creatinine Clearance: 102 ml/min (by C-G formula based on Cr of 0.83).   Microbiology: Recent Results (from the past 720 hour(s))  WOUND CULTURE     Status: Normal (Preliminary result)   Collection Time   12/14/11  2:12 PM      Component Value Range Status Comment   Specimen Description FOOT   Final    Special Requests NONE   Final    Gram Stain     Final    Value: NO WBC SEEN     FEW SQUAMOUS EPITHELIAL CELLS PRESENT     FEW GRAM NEGATIVE RODS   Culture NO GROWTH   Final    Report Status PENDING   Incomplete    Assessment:   Vancomycin 1 gram IV given at [facility] 11/19 ~2:40 pm.  Zosyn ordered on transfer to [facility], now to continue Vancomycin.  Tmax 102.7, WBC 9.3.  Gram negative rod on gram stain, but no growth to date. Good renal function. PO Diflucan x 1 today for yeast on UA.  Goal of Therapy:  Vancomycin trough level 15-20 mcg/ml appropriate Zosyn dose for renal function and infection  Plan:   Vancomycin 750 mg IV q8hrs.  Continue Zosyn 3.375 grams IV q8hrs (each infused over 4 hours).   Will follow up renal function, culture data and other studies, for any need to adjust regimen or check vancomycin trough level.    Platelet count 99K today.  Will need to watch on Lovenox 40 mg SQ q24hrs for VTE prophylaxis.  Dennie Fetters, RPh Pager:  [phone] 12/15/2011,11:43 AM

## 2011-12-15 NOTE — Progress Notes (Signed)
Interpreter Wyvonnia Dusky for [facility]
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
## 2011-12-15 NOTE — H&P (Signed)
Seen and examined.  Discussed with Dr. Ermalinda Memos and McGill.  Agree with their documentation and management.  Briefly, 36 yo Hispanic male with chronic medical and psychiatric problems (DM, alcoholism, psychosis and suicidal ideations),  Presents with a not-clear-cut constellation of symptoms.  Major issues: 1. Fever and tachycardia which is likely infectious and would meet criteria for sepsis.  He is a mildly immunocompromised host (DM and EtOHism).  Source?  Possible etiologies include:  A. Pyelo/UTI.  Patient does have Rt UQ and Rt. CVA tenderness and urine with bacteria and blood.  He is uncircumcised so the urine could be contaminant and his subjective symptoms are unreliable.  B. Foot cellulitis/early osteo.  Chronic right foot injury/skin breakdown/scaring.  He also has a red swollen toe, which he states is unchanged from the chronic state (but looks like cellulitis to me meeting him for the first time.)  Obvious concern for a diabetic foot infection.  C. His major complaint is Rt upper quadrant pain (improved since yesterday).  Cholecystitis would explain his symptoms.  Alcoholic hepatitis could cause the pain .  DTs could cause the fever and tachycardia, but he does not have any other physical exam findings for DTs. Whatever, prudent choice is broad spectrum antibiotics until his clinic course becomes more clear. 2. Suicidal ideations - agree with sitter and will need psych consult. Please see resident not for other medical problems.

## 2011-12-15 NOTE — Progress Notes (Signed)
Pt went down for MRI with a bag of NS with thiamine, folic acid, & multivitamins running. When the pt arrived the MRI technician attempted to disconnect the IV and blood started to come out of the IV so they reconnected it. The MRI technician did not run the fluid through the pump at the programed rate so it went in much faster than it should have. By the time the pt got back to the floor (5500) the bag was empty. The MD on call was notified and she consulted with the pharmacist and both agreed that there was nothing significant, other than a rash, that could occur from this. The pt showed no signs of rash. Will continue to monitor.
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
## 2011-12-15 NOTE — Progress Notes (Signed)
Clinical Social Work Department CLINICAL SOCIAL WORK PSYCHIATRY SERVICE LINE ASSESSMENT 12/15/2011  Patient:  Mitchell [name]  Account:  [id number]  Admit Date:  12/14/2011  Clinical Social Worker:  Unk Lightning, LCSW  Date/Time:  12/15/2011 11:00 AM Referred by:  Physician  Date referred:  12/15/2011 Reason for Referral  Substance Abuse  Behavioral Health Issues   Presenting Symptoms/Problems (In the person's/family's own words):   Patient admits with suicidal ideations. Patient reports that he told his MD over the phone that he felt like hurting himself and MD requested he be admitted to the hospital.   Abuse/Neglect/Trauma History (check all that apply)  Denies history   Abuse/Neglect/Trauma Comments:   Psychiatric History (check all that apply)  Inpatient/hospitilization   Psychiatric medications:  Celexa  Risperdal   Current Mental Health Hospitalizations/Previous Mental Health History:   Patient reports no follow up since recent [facility] admission   Current provider:   N/A   Place and Date:   N/A   Current Medications:   acetaminophen, acetaminophen, ibuprofen, LORazepam, LORazepam, [DISCONTINUED] HYDROcodone-acetaminophen                        . [COMPLETED] ciprofloxacin  400 mg Intravenous Once  . citalopram  40 mg Oral Daily  . [COMPLETED] diphenhydrAMINE      . [COMPLETED] diphenhydrAMINE  25 mg Intravenous Once  . enoxaparin (LOVENOX) injection  40 mg Subcutaneous Q24H  . [COMPLETED] fluconazole  150 mg Oral Once  . folic acid  1 mg Oral Daily  . insulin aspart  0-5 Units Subcutaneous QHS  . insulin aspart  0-9 Units Subcutaneous TID WC  . magnesium oxide  400 mg Oral BID  . pantoprazole  20 mg Oral Daily  . piperacillin-tazobactam (ZOSYN)  IV  3.375 g Intravenous Q8H  . potassium chloride  20 mEq Oral BID  . risperiDONE  0.5 mg Oral QHS  . [COMPLETED] general admission iv infusion   Intravenous Once  . [COMPLETED] sodium chloride  1,000 mL Intravenous Once    . thiamine  100 mg Oral Daily   Or     . thiamine  100 mg Intravenous Daily  . traZODone  50-100 mg Oral QHS  . vancomycin  750 mg Intravenous Q8H  . [COMPLETED] vancomycin  1,000 mg Intravenous Once  . [DISCONTINUED] cefTRIAXone (ROCEPHIN)  IV  1 g Intravenous Q24H  . [DISCONTINUED] multivitamin with minerals  1 tablet Oral Daily   Previous Impatient Admission/Date/Reason:   Patient admitted to [facility] this month and stayed for 3 days. Patient admitted for suicidal ideations.   Emotional Health / Current Symptoms    Suicide/Self Harm  Suicidal ideation (ex: "I can't take any more,I wish I could disappear")  Suicide attempt in past (date/description)   Suicide attempt in the past:   Patient reports about 8 months he attempted to kill himself by overdosing on medications. Patient reports no further attempts.   Other harmful behavior:   Patient reports he has had SI off and on for several years. Patient is unable to determine what triggered his attempt 8 months ago since that was his only attempt but he has had SI. Patient reports no SI or HI at this time.   Psychotic/Dissociative Symptoms  Auditory Hallucinations   Other Psychotic/Dissociative Symptoms:   Patient reports he hears voices that talk to him. The voices are not commanding but patient describes them as "background voices".    Attention/Behavioral Symptoms  Within Normal Limits  Other Attention / Behavioral Symptoms:    Cognitive Impairment  Within Normal Limits   Other Cognitive Impairment:    Mood and Adjustment  DEPRESSION    Stress, Anxiety, Trauma, Any Recent Loss/Stressor  Relationship  Other - See comment   Anxiety (frequency):   Phobia (specify):   Compulsive behavior (specify):   Obsessive behavior (specify):   Other:   Patient recently lost his job at [employer] due to not attending his shift because he was drinking. Patient has wife and children but is not living with them currently and  staying with his sister.   Substance Abuse/Use  Current substance use   SBIRT completed (please refer for detailed history):  Y  Self-reported substance use:   Patient reports he has been drinking 15 beers for the past 15 days. Patient denies any other substance use.   Urinary Drug Screen Completed:  Y Alcohol level:   292    Environmental/Housing/Living Arrangement  With Family Member   Who is in the home:   Sister   Emergency contact:  Beatriz-[phone]   Financial  IPRS   Patient's Strengths and Goals (patient's own words):   Patient reports he wants to "get better and stop drinking"    Patient has support through sister. Patient agreeable to outpatient follow up. Patient has transportation to get to outpatient appointments.   Clinical Social Worker's Interpretive Summary:   CSW received referral from MD due to patient substance use and SI. CSW reviewed chart which stated patient was recently at [facility] and at [facility]. CSW met with patient at bedside with interpreter present.    CSW introduced myself and explained role. Patient agreeable to CSW consult and open throughout discussion. Patient reports he came to hospital because of wound on his leg. Patient also reports that he was speaking with MD on the phone and told MD of SI. Patient reports that MD asked patient to be admitted. Patient reports that he has had SI for several years but only 1 attempt. Patient reports no SI or HI at this time. Patient has auditory hallucinations but is able to distinguish between voices and reality. Patient recently admitted to [facility] and reports this was beneficial but did not have any follow up after hospitalization.    Patient reports that he has been drinking for the past 15 days. SBIRT completed with patient and CSW and patient discussed relationship between depression and substance use. Patient shows good insight in recognizing that increased alcohol use and increase his depression. Patient and  CSW discussed triggers to substance use and SI. Patient reports he recently lost his job and is living with his sister. Patient did not go into much detail but he used to live with wife and children. Patient struggles with identifying triggers for depression and reports he has been depressed for several years.    CSW spoke with patient regarding outpatient follow up to assist with SI and substance use. CSW informed patient of the benefit of counseling in order to recognize patterns and triggers and to increase positive coping skills. CSW encouraged patient to follow up with outpatient services at [facility] [place] and also provided patient with Mobile Crisis information. CSW encouraged patient to always call 911 if he felt he was going to hurt himself and could not get other formal supports.   Disposition:  Outpatient referral made/needed   Coverage for [facility]

## 2011-12-15 NOTE — Progress Notes (Signed)
Patient ID: Mitchell [name], male   DOB: Apr 07, 1975, 36 y.o.   MRN: [id number] I have to cancel surgery for tonight due to other emergent cases.  Will have to reschedule for tomorrow evening vs Friday due to the OR schedule.  I will let him eat.  I have spoken to him about this as well.  Continue IV antibiotics for now.

## 2011-12-15 NOTE — Progress Notes (Signed)
Family Medicine Teaching Service Attending Note  I discussed patient Mitchell [name]  with Dr. Claiborne Billings and reviewed their note for today.  I agree with their assessment and plan.
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
## 2011-12-16 LAB — COMPREHENSIVE METABOLIC PANEL
ALT: 28 U/L (ref 0–53)
ALT: 29 U/L (ref 0–53)
AST: 37 U/L (ref 0–37)
Alkaline Phosphatase: 86 U/L (ref 39–117)
CO2: 25 mEq/L (ref 19–32)
Calcium: 7.7 mg/dL — ABNORMAL LOW (ref 8.4–10.5)
Chloride: 102 mEq/L (ref 96–112)
Creatinine, Ser: 0.84 mg/dL (ref 0.50–1.35)
Creatinine, Ser: 0.82 mg/dL (ref 0.50–1.35)
GFR calc Af Amer: >90 mL/min (ref >90)
GFR calc non Af Amer: >90 mL/min (ref >90)
Glucose, Bld: 134 mg/dL — ABNORMAL HIGH (ref 70–99)
Potassium: 3.1 mEq/L — ABNORMAL LOW (ref 3.5–5.1)
Sodium: 139 mEq/L (ref 135–145)
Total Bilirubin: 1.1 mg/dL (ref 0.3–1.2)
Total Protein: 6.4 g/dL (ref 6.0–8.3)

## 2011-12-16 LAB — CBC WITH DIFFERENTIAL/PLATELET
Basophils Absolute: 0 10*3/uL (ref 0.0–0.1)
Basophils Relative: 1 % (ref 0–1)
Eosinophils Absolute: 0 10*3/uL (ref 0.0–0.7)
Eosinophils Relative: 1 % (ref 0–5)
Lymphocytes Relative: 27 % (ref 12–46)
Lymphs Abs: 1 10*3/uL (ref 0.7–4.0)
MCH: 32.7 pg (ref 26.0–34.0)
MCHC: 36.8 g/dL — ABNORMAL HIGH (ref 30.0–36.0)
MCV: 88.9 fL (ref 78.0–100.0)
Neutrophils Relative %: 59 % (ref 43–77)
Neutrophils Relative %: 61 % (ref 43–77)
Platelets: 67 10*3/uL — ABNORMAL LOW (ref 150–400)
Platelets: 65 10*3/uL — ABNORMAL LOW (ref 150–400)
RBC: 3.64 MIL/uL — ABNORMAL LOW (ref 4.22–5.81)
RBC: 3.7 MIL/uL — ABNORMAL LOW (ref 4.22–5.81)
RDW: 13.8 % (ref 11.5–15.5)
WBC: 3.8 10*3/uL — ABNORMAL LOW (ref 4.0–10.5)

## 2011-12-16 LAB — URINE CULTURE: Colony Count: 10000

## 2011-12-16 LAB — GLUCOSE, CAPILLARY
Glucose-Capillary: 128 mg/dL — ABNORMAL HIGH (ref 70–99)
Glucose-Capillary: 136 mg/dL — ABNORMAL HIGH (ref 70–99)

## 2011-12-16 MED ORDER — POTASSIUM CHLORIDE CRYS ER 20 MEQ PO TBCR
20.0000 meq | EXTENDED_RELEASE_TABLET | Freq: Two times a day (BID) | ORAL | Status: AC
  Administered 2011-12-16 (×2): 20 meq via ORAL
  Filled 2011-12-16: qty 2
  Filled 2011-12-16: qty 1

## 2011-12-16 NOTE — Progress Notes (Signed)
Patient ID: Mitchell [name], male   DOB: [birth year], 36 y.o.   MRN: [id number] Right foot with thin atrophic scar tissue both dorsal and plantar. Patient has a weak dorsalis pedis pulse with a strong posterior tibial pulse. Examination he has cellulitis Wagner grade 3 ulcer and osteomyelitis of the first metatarsal right foot. Review of the radiographs shows a subtalar and midfoot fusion. Patient has significant deformity of the forefoot.  Assessment: Osteomyelitis ulceration cellulitis right foot first metatarsal head.  Plan: Will plan for a right foot first ray amputation tomorrow afternoon n.p.o. after midnight tonight.

## 2011-12-16 NOTE — Progress Notes (Signed)
Family Medicine Teaching Service PGY-1   Subjective: Interpreter present this morning. Mitchell [name] reports he is feeling a "little bit" better. He is smiling more today during our conversation. He does admit it still hurts when he urinates. He reports his foot and stomach do not hurt as bad. He still has no appetite and did not eat yesterday because he was suppose to go to surgery, but it has been rescheduled for tomorrow. I have encouraged him to attempt to eat something today. He is asking for pain medications today.   Objective: Vital signs in last 24 hours: Temp:  [98.7 F (37.1 C)-99 F (37.2 C)] 98.7 F (37.1 C) (11/21 0735) Pulse Rate:  [66-79] 66  (11/21 0735) Resp:  [16-18] 16  (11/21 0735) BP: (116-124)/(69-75) 119/69 mmHg (11/21 0735) SpO2:  [97 %-98 %] 98 % (11/21 0735) Weight change:  Last BM Date: 12/15/11  Intake/Output from previous day: 11/20 0701 - 11/21 0700 In: 300 [IV Piggyback:300] Out: -    Physical Exam: Constitutional: He appears less agitated today.  Cardiovascular: Normal rate, regular rhythm, normal heart sounds and intact distal pulses.  No murmur heard.  Pulmonary/Chest: Effort normal and breath sounds normal. No respiratory distress. He has no wheezes. He has no rales.  Abdominal: Soft. Bowel sounds are hypoactive. He exhibits no distension, no fluid wave, no ascites and no mass. There is tenderness diffusely. Right >left. Tender around to his back on the right side. There is guarding. Again, patient is difficult to read. Genitourinary: Uncircumcised. Penile tenderness present.  Musculoskeletal: He exhibits tenderness.  Right foot warm to touch along medial edge of large toe to mid foot.rednessand swelling present. Plantar aspect of foot has multiple open wounds, with drainage.  Neurological: He is alert and oriented to person, place, and time.  Skin: Skin is warm and dry. No rash noted.  Psychiatric: He seems in better spirits today and again denies  SI. Back: Back pain is diffusely tender thoracic to lumbar including bilateral flanks. Bilateral CVA tenderness.   Lab Results:  [facility] 12/16/11 0706 12/15/11 1232  WBC 3.8* 5.3  HGB 12.0* 12.7*  HCT 32.6* 34.9*  PLT 67* 75*   BMET  Basename 12/16/11 0706 12/15/11 0615  NA 139 139  K 3.1* 3.0*  CL 104 105  CO2 24 28  GLUCOSE 134* 150*  BUN 7 6  CREATININE 0.84 0.83  CALCIUM 7.7* 7.6*    Studies/Results: US Abdomen Complete  12/15/2011  *RADIOLOGY REPORT*  Clinical Data:  Evaluate for ascites  ABDOMINAL ULTRASOUND COMPLETE  Comparison:  12/20/2010  Findings: No evidence of ascites.  Gallbladder:  No gallstones, gallbladder wall thickening, or pericholecystic fluid.  Common Bile Duct:  Within normal limits in caliber.  Liver: No focal mass lesion identified.  The liver is heterogeneous and there is a suspected nodular contour.  IVC:  Appears normal.  Pancreas:  No abnormality identified.  Spleen:  The spleen is now mildly enlarged with a greatest dimension of 12.3 cm.  Previously, the largest dimension was 10.3 cm.  Right kidney:  Normal in size and parenchymal echogenicity.  No evidence of mass or hydronephrosis.  Left kidney:  Normal in size and parenchymal echogenicity.  No evidence of mass or hydronephrosis.  Abdominal Aorta:  No aneurysm identified.  IMPRESSION: Interval development of mild splenomegaly.  Suspected cirrhotic change of the liver.  CT or MR of the liver may be helpful to further characterize.   Original Report Authenticated By: Jolaine Click, M.D.  Mr Foot Right Wo Contrast  12/15/2011  *RADIOLOGY REPORT*  Clinical Data: Osteomyelitis.  MRI OF THE RIGHT FOREFOOT WITHOUT CONTRAST  Technique:  Multiplanar, multisequence MR imaging was performed. No intravenous contrast was administered.  Comparison: 12/14/2011.  Findings: Severe osteopenia.  Ankylosis between the second third metatarsal heads.  Ankylosis of the midfoot.  Ulceration is present over the eroded first  metatarsal head.  There is phlegmon in the soft tissues extending up to the surface of bone and cortical osteolysis along the plantar aspect of the first metatarsal head and neck.  Prior radiographs show the destroyed first MTP joint space and MRI confirms that with small dorsal joint effusion.  There is no ankylosis across the joint.  Erosive changes are present at the other MTP joints along with hammertoe.  Diffuse subcutaneous edema of the forefoot compatible with cellulitis or dependent edema.  Small midfoot joint effusions are present. Appears to be partial ankylosis of the first tarsometatarsal junction.  The no discrete soft tissue abscess is identified.  Bone marrow edema is present in the base of the proximal phalanx of the great toe, suggesting septic arthritis and associated osteomyelitis of the proximal phalanx.  IMPRESSION: Plantar foot ulceration overlying the first metatarsal head and neck with bone marrow edema and cortical osteolysis compatible with osteomyelitis.  Suspect first MTP septic arthritis and associated osteomyelitis of the proximal phalanx of the great toe.   Original Report Authenticated By: Andreas Newport, M.D.    Mr Ankle Right  Wo Contrast  12/15/2011  *RADIOLOGY REPORT*  Clinical Data: Osteomyelitis versus cellulitis.  Chronic right foot pain.  Fever.  MRI RIGHT ANKLE WITHOUT CONTRAST  Technique:  Multiplanar, multisequence MR imaging of the right ankle was performed.  No intravenous contrast was administered.  Comparison:  Radiographs 12/14/2011.  Findings:  Poly articular ankylosis is present.  The ankle joint remains open.  Subtalar joints are fused.  Midfoot joints are fused as well.  The talonavicular joint is severely degenerated and remains open.  Achilles tendon and plantar fascia are within normal limits.  There is no evidence of hind foot, distal leg or ankle osteomyelitis. Severe ankle osteoarthritis.  No osteochondral lesion of the talar dome.  The medial and lateral  ankle stabilizing ligaments appear within normal limits.  Diminutive posteromedial and posterolateral tendons, likely secondary ankylosis.  The anterior tendon group appears intact.  Diffuse edema is present in the distal leg and ankle, compatible with dependent edema or cellulitis in the appropriate clinical setting. Osteopenia is again noted.  Foot muscular atrophy.  IMPRESSION: 1.  Polyarticular ankylosis.  Question juvenile rheumatoid arthritis.  Other chronic inflammatory arthritides could produce the same result. 2.  Severe ankle and talonavicular osteoarthritis. 3.  Diffuse leg and ankle edema which may represent dependent edema or cellulitis in the appropriate clinical setting. 4.  No osteomyelitis or abscess.   Original Report Authenticated By: Andreas Newport, M.D.    Dg Foot Complete Right  12/14/2011  *RADIOLOGY REPORT*  Clinical Data: Open wound on the plantar surface of the foot  RIGHT FOOT COMPLETE - 3+ VIEW  Comparison: Right foot films of 11/15  Findings: The bones of the right foot are osteopenic and there is chronic deformity of the hindfoot, midfoot, and forefoot.  However, no radiographic evidence of osteomyelitis is seen with certainty. If more sensitive assessment is warranted MRI would be recommended.  IMPRESSION: Osteopenia and chronic deformity.  No definite radiographic evidence of osteomyelitis.  Consider MRI if warranted.   Original Report  Authenticated By: Dwyane Dee, M.D.     Medications: I have reviewed the patient's current medications.   Assessment/Plan:  Mitchell [name] is a 36 y.o. year old male With PMH of DM2, Chronic R foot wounds, and EtOH abuse presenting with suicidal ideation, fever, and foot pain.  Sepsis (on admission) - Febrile to 102.7 and HR 105, of note, WBC is normal at 9 --> resolved - possible sources of infection are chronic R foot wound and/or possible GU infection as explained    below.  - Vanc and zosyn, will narrow once source of fever is discovered    R foot pain  - MRI foot and ankle: Osteomyelitis and cellulitis - Vanc and zosyn  - Wound care consult: dressing applied. - Preliminary superficial wound cultures: GNR - PLT: 75 --> 67 - surgery rescheduled for tonight. I&D with probable right large toe amputation. Abdominal/back pain with Dysuria  - With abdominal pain, CVA tenderness, and tender prostate concerned for prostatitis, pyelonephritis, renal calculi, bladder/kidney pathology.  - pain needs to be addressed today, however with his substance abuse history and possible liver/kidney dysfunction this is going to be a difficult decision. - zosyn as above will cover likely organisms.  - UA: Many yeast : Diflucan 150 mg PO yesterday UA: - 1st specimen: not sterile container  - 2nd: After abx coverage  - Urine probe G/C: Pending  DM2  - Last A1C in 06/2011 was 6.5 --> 6.9 (now)  - Yeast dermatitis of penis: Diflucan 150 mg yesterday - Hold metformin and glyburide  - SSI - Moderate  - Glucose range: 118-204 Psych: Inpatient at [facility] 3-4 months ago for 2 days  - Depression:  - Feeling increasingly depressed over the last three months  - Suicidal ideation:  - Patient denies current SI, however admits to recent SI  - Sitter at bedside  - Hallucinations  - Auditory hallucinations for last three months  - Continue Risperdal PRN, celexa, and trazadone  - Psych consulted: Thank you for your recommendations. - Per [facility] notes, pt was supposed to be admitted to [facility] on 12/10/11, but no bed was immediately available, so he was to get medical clearance and wait for bed in [facility]- it is unclear if he was ever actually admitted or just d/c'ed home from the ED  Substance abuse (EtOH)  - CIWA --> has not needed intervention as of yet - offer detox and counseling prior to discharge - of note, patient did have UDS + cocaine 12/08/11 at [facility] ED  FEN/GI: Carb modified, NS at 125 mL/hr, KCl IVPG x4 Prophylaxis: SQ Lovenox, po PPI for possible EtOH  gastritis  Disposition: Med surg, when available  Code Status: Full    LOS: 2 days   Lovelace Cerveny 12/16/2011, 9:03 AM
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
## 2011-12-16 NOTE — Progress Notes (Signed)
FMT[facility] Attending Daily Note: Anet Logsdon MD [phone] pager office [phone] I  have seen and examined this patient, reviewed their chart. I have discussed this patient with the resident. I agree with the resident's findings, assessment and care plan. 

## 2011-12-17 ENCOUNTER — Encounter (HOSPITAL_COMMUNITY): Payer: Self-pay | Admitting: Anesthesiology

## 2011-12-17 ENCOUNTER — Encounter (HOSPITAL_COMMUNITY)
Admission: EM | Disposition: A | Discharge status: Home / Self Care | Payer: Self-pay | Source: Home / Self Care | Attending: Family Medicine

## 2011-12-17 ENCOUNTER — Inpatient Hospital Stay (HOSPITAL_COMMUNITY): Payer: Medicaid Other | Admitting: Anesthesiology

## 2011-12-17 ENCOUNTER — Encounter (HOSPITAL_COMMUNITY): Payer: Self-pay | Admitting: Certified Registered Nurse Anesthetist

## 2011-12-17 DIAGNOSIS — E119 Type 2 diabetes mellitus without complications: Secondary | ICD-10-CM | POA: Diagnosis present

## 2011-12-17 HISTORY — PX: AMPUTATION: SHX166

## 2011-12-17 LAB — COMPREHENSIVE METABOLIC PANEL
ALT: 30 U/L (ref 0–53)
Albumin: 2.4 g/dL — ABNORMAL LOW (ref 3.5–5.2)
Alkaline Phosphatase: 90 U/L (ref 39–117)
Calcium: 8.2 mg/dL — ABNORMAL LOW (ref 8.4–10.5)
GFR calc Af Amer: >90 mL/min (ref >90)
Glucose, Bld: 140 mg/dL — ABNORMAL HIGH (ref 70–99)
Potassium: 3.3 mEq/L — ABNORMAL LOW (ref 3.5–5.1)
Sodium: 141 mEq/L (ref 135–145)
Total Protein: 6.9 g/dL (ref 6.0–8.3)

## 2011-12-17 LAB — CBC WITH DIFFERENTIAL/PLATELET
Basophils Absolute: 0 10*3/uL (ref 0.0–0.1)
Basophils Relative: 1 % (ref 0–1)
Eosinophils Absolute: 0 10*3/uL (ref 0.0–0.7)
Eosinophils Relative: 1 % (ref 0–5)
MCH: 32.8 pg (ref 26.0–34.0)
MCHC: 36.2 g/dL — ABNORMAL HIGH (ref 30.0–36.0)
MCV: 90.7 fL (ref 78.0–100.0)
Neutrophils Relative %: 50 % (ref 43–77)
Platelets: 77 10*3/uL — ABNORMAL LOW (ref 150–400)
RBC: 3.96 MIL/uL — ABNORMAL LOW (ref 4.22–5.81)
RDW: 14.1 % (ref 11.5–15.5)

## 2011-12-17 LAB — PROTIME-INR
INR: 1.25 (ref 0.00–1.49)
Prothrombin Time: 15.5 seconds — ABNORMAL HIGH (ref 11.6–15.2)

## 2011-12-17 LAB — GLUCOSE, CAPILLARY
Glucose-Capillary: 126 mg/dL — ABNORMAL HIGH (ref 70–99)
Glucose-Capillary: 116 mg/dL — ABNORMAL HIGH (ref 70–99)
Glucose-Capillary: 104 mg/dL — ABNORMAL HIGH (ref 70–99)
Glucose-Capillary: 99 mg/dL (ref 70–99)
Glucose-Capillary: 97 mg/dL (ref 70–99)
Glucose-Capillary: 239 mg/dL — ABNORMAL HIGH (ref 70–99)

## 2011-12-17 LAB — WOUND CULTURE: Culture: NO GROWTH

## 2011-12-17 SURGERY — AMPUTATION, FOOT, RAY
Anesthesia: Regional | Site: Foot | Laterality: Right | Wound class: Dirty or Infected

## 2011-12-17 MED ORDER — PROPOFOL 10 MG/ML IV BOLUS
INTRAVENOUS | Status: DC | PRN
  Administered 2011-12-17: 200 mg via INTRAVENOUS

## 2011-12-17 MED ORDER — ONDANSETRON HCL 4 MG/2ML IJ SOLN: 4.0000 mg | Freq: Once | INTRAMUSCULAR | Status: AC | PRN

## 2011-12-17 MED ORDER — GLYCOPYRROLATE 0.2 MG/ML IJ SOLN
INTRAMUSCULAR | Status: DC | PRN
  Administered 2011-12-17: 0.2 mg via INTRAVENOUS

## 2011-12-17 MED ORDER — LIDOCAINE HCL (CARDIAC) 10 MG/ML IV SOLN
INTRAVENOUS | Status: DC | PRN
  Administered 2011-12-17: 100 mg via INTRAVENOUS

## 2011-12-17 MED ORDER — HYDROMORPHONE HCL PF 1 MG/ML IJ SOLN: 0.2500 mg | INTRAMUSCULAR | Status: DC | PRN

## 2011-12-17 MED ORDER — MIDAZOLAM HCL 2 MG/2ML IJ SOLN: 1.0000 mg | INTRAMUSCULAR | Status: DC | PRN

## 2011-12-17 MED ORDER — CHLORHEXIDINE GLUCONATE 4 % EX LIQD
60.0000 mL | Freq: Once | CUTANEOUS | Status: AC
  Administered 2011-12-17: 4 via TOPICAL
  Filled 2011-12-17: qty 60

## 2011-12-17 MED ORDER — FLUCONAZOLE 150 MG PO TABS
150.0000 mg | ORAL_TABLET | Freq: Every day | ORAL | Status: DC
  Administered 2011-12-18 – 2011-12-19 (×2): 150 mg via ORAL
  Filled 2011-12-17 (×4): qty 1

## 2011-12-17 MED ORDER — LACTATED RINGERS IV SOLN
INTRAVENOUS | Status: DC | PRN
  Administered 2011-12-17 (×2): via INTRAVENOUS

## 2011-12-17 MED ORDER — 0.9 % SODIUM CHLORIDE (POUR BTL) OPTIME
TOPICAL | Status: DC | PRN
  Administered 2011-12-17: 1000 mL

## 2011-12-17 MED ORDER — FENTANYL CITRATE 0.05 MG/ML IJ SOLN: 50.0000 ug | INTRAMUSCULAR | Status: DC | PRN

## 2011-12-17 MED ORDER — FENTANYL CITRATE 0.05 MG/ML IJ SOLN
INTRAMUSCULAR | Status: DC | PRN
  Administered 2011-12-17 (×2): 50 ug via INTRAVENOUS

## 2011-12-17 SURGICAL SUPPLY — 39 items
BANDAGE ESMARK 6X9 LF (GAUZE/BANDAGES/DRESSINGS) IMPLANT
BANDAGE GAUZE ELAST BULKY 4 IN (GAUZE/BANDAGES/DRESSINGS) ×2 IMPLANT
BLADE SAW SGTL MED 73X18.5 STR (BLADE) IMPLANT
BNDG COHESIVE 4X5 TAN STRL (GAUZE/BANDAGES/DRESSINGS) ×2 IMPLANT
BNDG COHESIVE 6X5 TAN STRL LF (GAUZE/BANDAGES/DRESSINGS) IMPLANT
BNDG ESMARK 6X9 LF (GAUZE/BANDAGES/DRESSINGS)
CLOTH BEACON ORANGE TIMEOUT ST (SAFETY) ×2 IMPLANT
CUFF TOURNIQUET SINGLE 34IN LL (TOURNIQUET CUFF) IMPLANT
CUFF TOURNIQUET SINGLE 44IN (TOURNIQUET CUFF) IMPLANT
DRAPE U-SHAPE 47X51 STRL (DRAPES) ×2 IMPLANT
DRSG ADAPTIC 3X8 NADH LF (GAUZE/BANDAGES/DRESSINGS) ×2 IMPLANT
DURAPREP 26ML APPLICATOR (WOUND CARE) ×2 IMPLANT
ELECT REM PT RETURN 9FT ADLT (ELECTROSURGICAL) ×2
ELECTRODE REM PT RTRN 9FT ADLT (ELECTROSURGICAL) ×1 IMPLANT
GLOVE BIOGEL PI IND STRL 9 (GLOVE) ×1 IMPLANT
GLOVE BIOGEL PI INDICATOR 9 (GLOVE) ×1
GLOVE SURG ORTHO 9.0 STRL STRW (GLOVE) ×2 IMPLANT
GOWN PREVENTION PLUS XLARGE (GOWN DISPOSABLE) ×2 IMPLANT
GOWN SRG XL XLNG 56XLVL 4 (GOWN DISPOSABLE) ×1 IMPLANT
GOWN STRL NON-REIN XL XLG LVL4 (GOWN DISPOSABLE) ×1
KIT BASIN OR (CUSTOM PROCEDURE TRAY) ×2 IMPLANT
KIT ROOM TURNOVER OR (KITS) ×2 IMPLANT
MANIFOLD NEPTUNE II (INSTRUMENTS) ×2 IMPLANT
NS IRRIG 1000ML POUR BTL (IV SOLUTION) ×2 IMPLANT
PACK ORTHO EXTREMITY (CUSTOM PROCEDURE TRAY) ×2 IMPLANT
PAD ARMBOARD 7.5X6 YLW CONV (MISCELLANEOUS) ×4 IMPLANT
PAD CAST 4YDX4 CTTN HI CHSV (CAST SUPPLIES) IMPLANT
PADDING CAST COTTON 4X4 STRL (CAST SUPPLIES)
SPONGE GAUZE 4X4 12PLY (GAUZE/BANDAGES/DRESSINGS) ×2 IMPLANT
SPONGE LAP 18X18 X RAY DECT (DISPOSABLE) ×2 IMPLANT
STAPLER VISISTAT 35W (STAPLE) ×2 IMPLANT
STOCKINETTE IMPERVIOUS LG (DRAPES) IMPLANT
SUCTION FRAZIER TIP 10 FR DISP (SUCTIONS) ×2 IMPLANT
SUT ETHILON 2 0 PSLX (SUTURE) ×4 IMPLANT
TOWEL OR 17X24 6PK STRL BLUE (TOWEL DISPOSABLE) ×2 IMPLANT
TOWEL OR 17X26 10 PK STRL BLUE (TOWEL DISPOSABLE) ×2 IMPLANT
TUBE CONNECTING 12X1/4 (SUCTIONS) ×2 IMPLANT
UNDERPAD 30X30 INCONTINENT (UNDERPADS AND DIAPERS) ×2 IMPLANT
WATER STERILE IRR 1000ML POUR (IV SOLUTION) IMPLANT

## 2011-12-17 NOTE — Progress Notes (Signed)
Report given to Kathy RN

## 2011-12-17 NOTE — Anesthesia Procedure Notes (Signed)
Anesthesia Regional Block:  Ankle block  Pre-Anesthetic Checklist: ,, timeout performed, Correct Patient, Correct Site, Correct Laterality, Correct Procedure, Correct Position, site marked, Risks and benefits discussed,  Surgical consent,  Pre-op evaluation,  At surgeon's request and post-op pain management  Laterality: Right  Prep: Maximum Sterile Barrier Precautions used, chloraprep and alcohol swabs       Needles:  Injection technique: Single-shot      Needle Gauge: 25 and 25 G    Additional Needles: Ankle block Narrative:  Start time: 12/17/2011 4:00 PM End time: 12/17/2011 4:05 PM Injection made incrementally with aspirations every 5 mL.  Performed by: Personally  Anesthesiologist: Maren Beach MD  Additional Notes: Pt accepts procedure. 15cc ( 7cc 2% Lidocaine and 7cc 0.5% Marcaine ) Ant tibial and Post tibial. GES

## 2011-12-17 NOTE — Anesthesia Preprocedure Evaluation (Addendum)
Anesthesia Evaluation  Patient identified by MRN, date of birth, ID band Patient awake    Reviewed: Allergy & Precautions, H&P , NPO status , Patient's Chart, lab work & pertinent test results  History of Anesthesia Complications Negative for: history of anesthetic complications  Airway Mallampati: I TM Distance: >3 FB Neck ROM: full    Dental   Pulmonary neg pulmonary ROS,          Cardiovascular Rhythm:regular Rate:Normal     Neuro/Psych Depression Suicide attempts alcohol abuse    GI/Hepatic GERD-  Medicated and Controlled,(+)     substance abuse   , Hepatitis -, Toxin Related  Endo/Other  diabetes, Well Controlled, Oral Hypoglycemic Agents  Renal/GU      Musculoskeletal   Abdominal   Peds  Hematology   Anesthesia Other Findings   Reproductive/Obstetrics                         Anesthesia Physical Anesthesia Plan  ASA: III  Anesthesia Plan: General and Regional   Post-op Pain Management: MAC Combined w/ Regional for Post-op pain   Induction: Intravenous  Airway Management Planned: LMA  Additional Equipment:   Intra-op Plan:   Post-operative Plan: Extubation in OR  Informed Consent: I have reviewed the patients History and Physical, chart, labs and discussed the procedure including the risks, benefits and alternatives for the proposed anesthesia with the patient or authorized representative who has indicated his/her understanding and acceptance.     Plan Discussed with: CRNA, Anesthesiologist and Surgeon  Anesthesia Plan Comments:        Anesthesia Quick Evaluation

## 2011-12-17 NOTE — Anesthesia Postprocedure Evaluation (Signed)
Anesthesia Post Note  Patient: Mitchell [name]  Procedure(s) Performed: Procedure(s) (LRB): AMPUTATION RAY (Right)  Anesthesia type: general  Patient location: PACU  Post pain: Pain level controlled  Post assessment: Patient's Cardiovascular Status Stable  Last Vitals:  Filed Vitals:   12/17/11 1915  BP: 114/75  Pulse: 74  Temp: 36.8 C  Resp: 19    Post vital signs: Reviewed and stable  Level of consciousness: sedated  Complications: No apparent anesthesia complications

## 2011-12-17 NOTE — Progress Notes (Signed)
Patient ID: Mitchell [name], male   DOB: 02/04/1975, 36 y.o.   MRN: [id number] Family Medicine Teaching Service PGY-1  Subjective: Interpreter present this morning.  Patient reports he is feeling much better this morning. Is foot is only hurting a "little bit." He reports his urine is still red in color, but it is slightly better. He is not experiencing as much pain with urination as prior. His yeast infection is starting to clear and he is cleansing the area well [name]. His abdominal pian is improved, however he is still greatly tender on the RUQ. He reports his back pain has also improved greatly. He has no other complaints and we discussed all his results thus far. Patient reports he is "improving." He does admit to an episode of diarrhea yesterday after eating a meal.   Objective: Vital signs in last 24 hours: Temp:  [98.2 F (36.8 C)-98.6 F (37 C)] 98.2 F (36.8 C) (11/22 0300) Pulse Rate:  [56-70] 58  (11/22 0950) Resp:  [15-16] 16  (11/22 0950) BP: (116-126)/(57-84) 119/71 mmHg (11/22 0950) SpO2:  [99 %] 99 % (11/22 0950) Weight change:  Last BM Date: 12/16/11  Intake/Output from previous day: 11/21 0701 - 11/22 0700 In: 980 [P.O.:580; IV Piggyback:400] Out: -    Physical Exam: Constitutional: He appears less agitated today.  Cardiovascular: Normal rate, regular rhythm, normal heart sounds and intact distal pulses.  No murmur heard.  Pulmonary/Chest: Effort normal and breath sounds normal. No respiratory distress. He has no wheezes. He has no rales.  Abdominal: Soft. Bowel sounds positive. He exhibits slight distension, no fluid wave, no ascites and no mass. There is tenderness on the  Right.Tender around to his back on the right side. There is guarding.  Genitourinary: Uncircumcised. Penile tenderness present.  Musculoskeletal: He exhibits tenderness.  Right foot warm to touch along medial edge of large toe to mid foot.rednessand swelling present. Plantar aspect of foot has  multiple wounds, One open with drainage. Color is darker today. Grayish. Less swollen. Odor present. Neurological: He is alert and oriented to person, place, and time.  Skin: Skin is warm and dry. No rash noted.  Psychiatric: He seems in better spirits today and again denies SI. Back: improved. Pain on right side only.  Lab Results:  [facility] 12/17/11 0858 12/16/11 0919  WBC 2.8* 3.3*  HGB 13.0 12.1*  HCT 35.9* 32.9*  PLT 77* 65*   BMET  Basename 12/17/11 0858 12/16/11 0919  NA 141 135  K 3.3* 3.1*  CL 106 102  CO2 26 25  GLUCOSE 140* 269*  BUN 5* 7  CREATININE 0.87 0.82  CALCIUM 8.2* 7.7*    Studies/Results: US Abdomen Complete  12/15/2011  *RADIOLOGY REPORT*  Clinical Data:  Evaluate for ascites  ABDOMINAL ULTRASOUND COMPLETE  Comparison:  12/20/2010  Findings: No evidence of ascites.  Gallbladder:  No gallstones, gallbladder wall thickening, or pericholecystic fluid.  Common Bile Duct:  Within normal limits in caliber.  Liver: No focal mass lesion identified.  The liver is heterogeneous and there is a suspected nodular contour.  IVC:  Appears normal.  Pancreas:  No abnormality identified.  Spleen:  The spleen is now mildly enlarged with a greatest dimension of 12.3 cm.  Previously, the largest dimension was 10.3 cm.  Right kidney:  Normal in size and parenchymal echogenicity.  No evidence of mass or hydronephrosis.  Left kidney:  Normal in size and parenchymal echogenicity.  No evidence of mass or hydronephrosis.  Abdominal Aorta:  No  aneurysm identified.  IMPRESSION: Interval development of mild splenomegaly.  Suspected cirrhotic change of the liver.  CT or MR of the liver may be helpful to further characterize.   Original Report Authenticated By: Jolaine Click, M.D.     Medications: I have reviewed the patient's current medications.   Assessment/Plan:  Mitchell [name] is a 36 y.o. year old male With PMH of DM2, Chronic R foot wounds, and EtOH abuse presenting with suicidal  ideation, fever, abdominal pain and foot pain.  Sepsis (on admission) - Febrile to 102.7 and HR 105, of note, WBC is normal at 9 --> resolved - possible sources of infection are chronic R foot wound and/or possible GU infection as explained    below.  - Vanc and zosyn, will narrow once source of fever is discovered   R foot pain  - MRI foot and ankle: Osteomyelitis and cellulitis - Vanc and zosyn  - Wound care consult:ed, no dressing in place. - Preliminary superficial wound cultures: GNR - PLT: 75 --> 67-->77 - surgery rescheduled for today. I&D with probable right large toe amputation.  Abdominal/back pain with Dysuria  - With right sided abdominal pain and tender prostate concerned for prostatitis,  renal calculi, bladder/kidney pathology.  - pain needs to be addressed today, however with his substance abuse history and possible liver/kidney dysfunction this is going to be a difficult decision. - zosyn as above will cover likely organisms.  - UA:   -RBC TNTC and Many yeast  - GBS positive, vanc sensitive  - Treated yeast with diflucan 150 mg PO 11/20 - Urine probe G/C: Negative - CT abd/pelvis today  DM2  - Last A1C in 06/2011 was 6.5 --> 6.9 (now)  - Yeast dermatitis of penis: Diflucan 150 mg yesterday - Hold metformin and glyburide  - SSI - Moderate  - Glucose range: 92 - 153  Psych: Inpatient at [facility] 3-4 months ago for 2 days  - Depression:  - Feeling increasingly depressed over the last three months  - Suicidal ideation:  - Patient denies current SI, however admits to recent SI  - Sitter at bedside  - Hallucinations  - Auditory hallucinations for last three months  - Continue Risperdal PRN, celexa, and trazadone  - Psych consulted: Thank you for your recommendations.  Substance abuse (EtOH)  - CIWA --> has not needed intervention as of yet - offer detox and counseling prior to discharge - of note, patient did have UDS + cocaine 12/08/11 at [facility] ED   Diarrhea: - if  diarrhea continues will consider stool studies - Probable antibiotic reaction  FEN/GI: Carb modified, NS at 125 mL/hr, KCl IVPG x4 Prophylaxis: SQ Lovenox, po PPI for possible EtOH gastritis  Disposition: Med surg, when available  Code Status: Full    LOS: 3 days   Felix Pacini 12/17/2011, 11:35 AM

## 2011-12-17 NOTE — Transfer of Care (Signed)
Immediate Anesthesia Transfer of Care Note  Patient: Mitchell [name]  Procedure(s) Performed: Procedure(s) (LRB) with comments: AMPUTATION RAY (Right) - 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  Patient Location: PACU  Anesthesia Type:General  Level of Consciousness: awake, alert  and oriented  Airway & Oxygen Therapy: Patient Spontanous Breathing and Patient connected to face mask oxygen  Post-op Assessment: Report given to PACU RN, Post -op Vital signs reviewed and stable and Patient moving all extremities X 4  Post vital signs: Reviewed and stable  Complications: No apparent anesthesia complications

## 2011-12-17 NOTE — Progress Notes (Signed)
Interpreter Biana Haggar Namihira for MD °
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
## 2011-12-17 NOTE — Interval H&P Note (Signed)
History and Physical Interval Note:  12/17/2011 5:25 PM  Mitchell [name]  has presented today for surgery, with the diagnosis of Osteomyelitis Right Great Toe  The various methods of treatment have been discussed with the patient and family. After consideration of risks, benefits and other options for treatment, the patient has consented to  Procedure(s) (LRB) with comments: AMPUTATION RAY (Right) - 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 as a surgical intervention .  The patient's history has been reviewed, patient examined, no change in status, stable for surgery.  I have reviewed the patient's chart and labs.  Questions were answered to the patient's satisfaction.     Rockford Leinen V

## 2011-12-17 NOTE — Progress Notes (Signed)
Family Medicine Teaching Service Attending Note  I interviewed and examined patient Mitchell [name] and reviewed their tests and x-rays.  I discussed with Dr. Claiborne Billings and reviewed their note for today.  I agree with their assessment and plan.     Agree with getting Abdomen CT with gross hematuria Proceed as per ortho for foot

## 2011-12-17 NOTE — H&P (View-Only) (Signed)
Patient ID: Mitchell [name], male   DOB: 03/12/1975, 36 y.o.   MRN: [id number] Right foot with thin atrophic scar tissue both dorsal and plantar. Patient has a weak dorsalis pedis pulse with a strong posterior tibial pulse. Examination he has cellulitis Wagner grade 3 ulcer and osteomyelitis of the first metatarsal right foot. Review of the radiographs shows a subtalar and midfoot fusion. Patient has significant deformity of the forefoot.  Assessment: Osteomyelitis ulceration cellulitis right foot first metatarsal head.  Plan: Will plan for a right foot first ray amputation tomorrow afternoon n.p.o. after midnight tonight. 

## 2011-12-18 ENCOUNTER — Inpatient Hospital Stay (HOSPITAL_COMMUNITY): Payer: Medicaid Other

## 2011-12-18 LAB — BASIC METABOLIC PANEL
Chloride: 105 mEq/L (ref 96–112)
Creatinine, Ser: 1.06 mg/dL (ref 0.50–1.35)
GFR calc Af Amer: >90 mL/min (ref >90)
GFR calc non Af Amer: 88 mL/min — ABNORMAL LOW (ref >90)
Potassium: 3.1 mEq/L — ABNORMAL LOW (ref 3.5–5.1)

## 2011-12-18 LAB — CBC
HCT: 30.7 % — ABNORMAL LOW (ref 39.0–52.0)
RDW: 14.2 % (ref 11.5–15.5)
WBC: 4 10*3/uL (ref 4.0–10.5)

## 2011-12-18 LAB — GLUCOSE, CAPILLARY
Glucose-Capillary: 108 mg/dL — ABNORMAL HIGH (ref 70–99)
Glucose-Capillary: 79 mg/dL (ref 70–99)

## 2011-12-18 MED ORDER — POTASSIUM CHLORIDE CRYS ER 20 MEQ PO TBCR
40.0000 meq | EXTENDED_RELEASE_TABLET | Freq: Once | ORAL | Status: AC
  Administered 2011-12-18: 40 meq via ORAL
  Filled 2011-12-18: qty 2

## 2011-12-18 MED ORDER — IOHEXOL 300 MG/ML  SOLN
150.0000 mL | Freq: Once | INTRAMUSCULAR | Status: AC | PRN
  Administered 2011-12-18: 150 mL via INTRAVENOUS

## 2011-12-18 NOTE — Evaluation (Signed)
Physical Therapy Evaluation Patient Details Name: Mitchell [name] MRN: [id number] DOB: 10/11/1975 Today's Date: 12/18/2011 Time: 9562-1308 PT Time Calculation (min): 17 min  PT Assessment / Plan / Recommendation Clinical Impression  Patient is a 36 yo male s/p ray amputation rt. foot.  Patient will benefit from acute PT to maximize independence prior to discharge home with family.    PT Assessment  Patient needs continued PT services    Follow Up Recommendations  No PT follow up;Supervision/Assistance - 24 hour    Does the patient have the potential to tolerate intense rehabilitation      Barriers to Discharge        Equipment Recommendations  Rolling walker with 5" wheels    Recommendations for Other Services     Frequency Min 4X/week    Precautions / Restrictions Precautions Precautions: Fall Restrictions Weight Bearing Restrictions: Yes RLE Weight Bearing: Non weight bearing   Pertinent Vitals/Pain       Mobility  Bed Mobility Bed Mobility: Supine to Sit;Sitting - Scoot to Edge of Bed Supine to Sit: 5: Supervision;With rails;HOB flat Sitting - Scoot to Edge of Bed: 5: Supervision Details for Bed Mobility Assistance: No cues or assist needed Transfers Transfers: Sit to Stand;Stand to Sit Sit to Stand: 4: Min guard;With upper extremity assist;From bed Stand to Sit: 4: Min guard;With upper extremity assist;To bed Details for Transfer Assistance: Verbal and tactile cues for hand placement.  Tactile and visual cues for NWB RLE. Ambulation/Gait Ambulation/Gait Assistance: 4: Min assist Ambulation Distance (Feet): 64 Feet Assistive device: Rolling walker Ambulation/Gait Assistance Details: Verbal and visual cues for safe use of RW, and for gait sequence.  Patient able to maintain NWB status RLE. Gait Pattern: Step-to pattern;Decreased step length - left;Trunk flexed           PT Diagnosis: Difficulty walking;Abnormality of gait;Generalized weakness;Acute pain  PT  Problem List: Decreased strength;Decreased activity tolerance;Decreased balance;Decreased mobility;Decreased knowledge of use of DME;Decreased knowledge of precautions;Pain PT Treatment Interventions: DME instruction;Gait training;Functional mobility training;Patient/family education   PT Goals Acute Rehab PT Goals PT Goal Formulation: With patient Time For Goal Achievement: 12/25/11 Potential to Achieve Goals: Good Pt will go Supine/Side to Sit: Independently;with HOB 0 degrees PT Goal: Supine/Side to Sit - Progress: Goal set today Pt will go Sit to Supine/Side: Independently;with HOB 0 degrees PT Goal: Sit to Supine/Side - Progress: Goal set today Pt will go Sit to Stand: with supervision;with upper extremity assist PT Goal: Sit to Stand - Progress: Goal set today Pt will go Stand to Sit: with supervision;with upper extremity assist PT Goal: Stand to Sit - Progress: Goal set today Pt will Ambulate: >150 feet;with supervision;with rolling walker PT Goal: Ambulate - Progress: Goal set today  Visit Information  Last PT Received On: 12/18/11 Assistance Needed: +1    Subjective Data  Subjective: Minimal conversation.  Answers  yes/no Patient Stated Goal: To get home   Prior Functioning  Home Living Lives With: Spouse Available Help at Discharge: Family;Available 24 hours/day Type of Home: Apartment Home Access: Level entry Home Layout: One level Home Adaptive Equipment: None Prior Function Level of Independence: Independent Able to Take Stairs?: Yes Driving: No Vocation: Unemployed Communication Communication: Prefers language other than English    Cognition  Overall Cognitive Status: Difficult to assess Difficult to assess due to: Non-English speaking Arousal/Alertness: Awake/alert Orientation Level: Appears intact for tasks assessed Behavior During Session: Anxious    Extremity/Trunk Assessment Right Upper Extremity Assessment RUE ROM/Strength/Tone: [facility] for tasks  assessed  Left Upper Extremity Assessment LUE ROM/Strength/Tone: [facility] for tasks assessed Right Lower Extremity Assessment RLE ROM/Strength/Tone: Deficits;Unable to fully assess;Due to pain RLE ROM/Strength/Tone Deficits: Able to move RLE against gravity Left Lower Extremity Assessment LLE ROM/Strength/Tone: [facility] for tasks assessed Trunk Assessment Trunk Assessment: Normal   Balance    End of Session PT - End of Session Equipment Utilized During Treatment: Gait belt (Post-op shoe RLE) Activity Tolerance: Patient limited by pain;Patient limited by fatigue Patient left: in bed;with call bell/phone within reach;with nursing in room (sitting on EOB; sitter present) Nurse Communication: Mobility status;Weight bearing status  GP     Vena Austria 12/18/2011, 3:08 PM Durenda Hurt. Renaldo Fiddler, [facility] Acute Rehab Services Pager [phone]

## 2011-12-18 NOTE — Progress Notes (Signed)
ANTIBIOTIC CONSULT NOTE - FOLLOW UP  Pharmacy Consult for Vancomycin/Zosyn Indication: osteomyelitis of great R toe, UTI, prostatitis  No Known Allergies  Patient Measurements: Height: 5\' 4"  (162.6 cm) Weight: 155 lb 3.3 oz (70.4 kg) IBW/kg (Calculated) : 59.2   Vital Signs: Temp: 98.3 F (36.8 C) (11/23 0557) Temp src: Oral (11/23 0557) BP: 98/66 mmHg (11/23 0557) Pulse Rate: 61  (11/23 0557) Intake/Output from previous day: 11/22 0701 - 11/23 0700 In: 1760 [P.O.:360; I.V.:1000; IV Piggyback:400] Out: 30 [Blood:30] Intake/Output from this shift: Total I/O In: 360 [P.O.:360] Out: -   Labs:  Basename 12/18/11 0625 12/17/11 0858 12/16/11 0919  WBC 4.0 2.8* 3.3*  HGB 11.0* 13.0 12.1*  PLT 93* 77* 65*  LABCREA -- -- --  CREATININE 1.06 0.87 0.82   Estimated Creatinine Clearance: 79.9 ml/min (by C-G formula based on Cr of 1.06). No results found for this basename: VANCOTROUGH:2,VANCOPEAK:2,VANCORANDOM:2,GENTTROUGH:2,GENTPEAK:2,GENTRANDOM:2,TOBRATROUGH:2,TOBRAPEAK:2,TOBRARND:2,AMIKACINPEAK:2,AMIKACINTROU:2,AMIKACIN:2, in the last 72 hours   Microbiology: Recent Results (from the past 720 hour(s))  CULTURE, BLOOD (ROUTINE X 2)     Status: Normal (Preliminary result)   Collection Time   12/14/11 12:55 PM      Component Value Range Status Comment   Specimen Description BLOOD LEFT ARM   Final    Special Requests BOTTLES DRAWN AEROBIC AND ANAEROBIC 5CC   Final    Culture  Setup Time 12/15/2011 00:34   Final    Culture     Final    Value:        BLOOD CULTURE RECEIVED NO GROWTH TO DATE CULTURE WILL BE HELD FOR 5 DAYS BEFORE ISSUING A FINAL NEGATIVE REPORT   Report Status PENDING   Incomplete   WOUND CULTURE     Status: Normal   Collection Time   12/14/11  2:12 PM      Component Value Range Status Comment   Specimen Description FOOT   Final    Special Requests NONE   Final    Gram Stain     Final    Value: NO WBC SEEN     FEW SQUAMOUS EPITHELIAL CELLS PRESENT     FEW  GRAM NEGATIVE RODS   Culture NO GROWTH 2 DAYS   Final    Report Status 12/17/2011 FINAL   Final   CULTURE, BLOOD (ROUTINE X 2)     Status: Normal (Preliminary result)   Collection Time   12/14/11  4:05 PM      Component Value Range Status Comment   Specimen Description BLOOD LEFT ARM   Final    Special Requests BOTTLES DRAWN AEROBIC AND ANAEROBIC 5mL   Final    Culture  Setup Time 12/15/2011 00:34   Final    Culture     Final    Value:        BLOOD CULTURE RECEIVED NO GROWTH TO DATE CULTURE WILL BE HELD FOR 5 DAYS BEFORE ISSUING A FINAL NEGATIVE REPORT   Report Status PENDING   Incomplete   URINE CULTURE     Status: Normal   Collection Time   12/14/11  8:37 PM      Component Value Range Status Comment   Specimen Description URINE, RANDOM   Final    Special Requests vanc, cipro   Final    Culture  Setup Time 12/14/2011 22:16   Final    Colony Count 10,000 COLONIES/ML   Final    Culture     Final    Value: GROUP B STREP(S.AGALACTIAE)ISOLATED  Note: TESTING AGAINST S. AGALACTIAE NOT ROUTINELY PERFORMED DUE TO PREDICTABILITY OF AMP/PEN/VAN SUSCEPTIBILITY.   Report Status 12/16/2011 FINAL   Final   SURGICAL PCR SCREEN     Status: Normal   Collection Time   12/15/11  1:02 PM      Component Value Range Status Comment   MRSA, PCR NEGATIVE  NEGATIVE Final    Staphylococcus aureus NEGATIVE  NEGATIVE Final   GC/CHLAMYDIA PROBE AMP     Status: Normal   Collection Time   12/15/11  3:04 PM      Component Value Range Status Comment   CT Probe RNA NEGATIVE  NEGATIVE Final    GC Probe RNA NEGATIVE  NEGATIVE Final     Anti-infectives     Start     Dose/Rate Route Frequency Ordered Stop   12/17/11 1300   fluconazole (DIFLUCAN) tablet 150 mg        150 mg Oral Daily 12/17/11 1145     12/15/11 1200   vancomycin (VANCOCIN) 750 mg in sodium chloride 0.9 % 150 mL IVPB        750 mg 150 mL/hr over 60 Minutes Intravenous Every 8 hours 12/15/11 1142     12/15/11 0900   fluconazole (DIFLUCAN)  tablet 150 mg        150 mg Oral  Once 12/15/11 0857 12/15/11 1139   12/14/11 2000  piperacillin-tazobactam (ZOSYN) IVPB 3.375 g       3.375 g 12.5 mL/hr over 240 Minutes Intravenous Every 8 hours 12/14/11 1925     12/14/11 1930   cefTRIAXone (ROCEPHIN) 1 g in dextrose 5 % 50 mL IVPB  Status:  Discontinued        1 g 100 mL/hr over 30 Minutes Intravenous Every 24 hours 12/14/11 1911 12/14/11 1917   12/14/11 1430   vancomycin (VANCOCIN) IVPB 1000 mg/200 mL premix        1,000 mg 200 mL/hr over 60 Minutes Intravenous  Once 12/14/11 1341 12/14/11 1541   12/14/11 1345   ciprofloxacin (CIPRO) IVPB 400 mg        400 mg 200 mL/hr over 60 Minutes Intravenous  Once 12/14/11 1341 12/14/11 1702          Assessment:  Pt is a 36 y/o M s/p right foot first ray amputation for osteomyelitis, along with likely prostatitis per MD. Pharmacy is consulted to dose vancomycin/zosyn. WBC 4, Afebrile, Plts 93 (up from 77), will watch plts as patient is on lovenox, likely due to alcoholic hepatitis. Renal function stable  Zosyn 11/19>> Vanc 11/19>> Cipro 11/19 x 1 Flucon PO 11/20 x 1, 11/22>>  11/19 - wound cx -ng x 2 day, final (but few GNR on gram stain) 11/19 - urine - 10k/ml Strep agalactiae 11/19 - blood x 2 - ngtd 11/20 - Chlamydia probe - neg 11/20 - MRSA screen neg  Goal of Therapy:  Vancomycin trough level 15-20 mcg/ml  Plan:  - Continue vancomycin 750mg  IV q8h - Get vancomycin trough on 11/24 at 1130 - Continue zosyn at current dose - Follow clinical progression - K low, being replaced, f/u   Abran Duke, PharmD Clinical Pharmacist Phone: [phone] Pager: [phone] 12/18/2011 1:47 PM

## 2011-12-18 NOTE — Progress Notes (Signed)
Family Medicine Teaching Service Attending Note  I discussed patient Mitchell [name]  with Dr. Claiborne Billings and reviewed their note for today.  I agree with their assessment and plan.     Continue IV antibiotics until signs of local infection resolve Need psychiatry review for need for sitter or in patient behav evaluation Will need eventual cystoscopy for persistent gross hematuria

## 2011-12-18 NOTE — Progress Notes (Signed)
Family Medicine Teaching Service PGY-1 progress note  Subjective: Mitchell [name] 36 y.o.   Telephone interpreter Mitchell [name]. Mitchell [name] has had his surgery. He feels some pain , rating it a 7/10 in his foot, but feels it has improved. He reports continued dysuria and hematuria, however he feels it is also getting better. His yeast infection of his penis is almost completely cleared and he has been able to further retract the foreskin than prior. His abdominal pain is improving greatly, however he still reports it is hurting on the right side to his back.   Objective: Vital signs in last 24 hours: Temp:  [97.1 F (36.2 C)-98.3 F (36.8 C)] 98.3 F (36.8 C) (11/23 0557) Pulse Rate:  [61-81] 61  (11/23 0557) Resp:  [16-21] 20  (11/23 0557) BP: (98-128)/(66-83) 98/66 mmHg (11/23 0557) SpO2:  [96 %-99 %] 99 % (11/23 0557) Weight change:  Last BM Date: 12/16/11  Intake/Output from previous day: 11/22 0701 - 11/23 0700 In: 1760 [P.O.:360; I.V.:1000; IV Piggyback:400] Out: 30 [Blood:30] Intake/Output this shift:   Constitutional: He appears well today. In pain though. Cardiovascular: Normal rate, regular rhythm, normal heart sounds and intact distal pulses.  No murmur heard.  Pulmonary/Chest: Effort normal and breath sounds normal. No respiratory distress. He has no wheezes. He has no rales.  Abdominal: Soft. Bowel sounds positive. He exhibits slight distension, no fluid wave, no ascites and no mass. There is tenderness on the Right.Tender around to his back on the right side.  Genitourinary: Uncircumcised. Penile tenderness present, but improving.  Musculoskeletal: He exhibits tenderness. Dressing applied, bandaged plus ACE wrap. 2nd-5th toes visualized, pink. Cap refill good. Sensation intact.  Psychiatric: He seems well today. Smiling. Sitter present. Back: improved. Pain on right side only.   Lab Results:  [facility] 12/18/11 0625 12/17/11 0858  WBC 4.0 2.8*  HGB 11.0* 13.0  HCT 30.7*  35.9*  PLT 93* 77*   BMET  Basename 12/18/11 0625 12/17/11 0858  NA 138 141  K 3.1* 3.3*  CL 105 106  CO2 26 26  GLUCOSE 152* 140*  BUN 6 5*  CREATININE 1.06 0.87  CALCIUM 7.9* 8.2*   Medications: I have reviewed the patient's current medications.  Assessment/Plan: Mitchell [name] is a 36 y.o. year old male With PMH of DM2, Chronic R foot wounds, and EtOH abuse presenting with suicidal ideation, fever, abdominal pain and foot pain.  Sepsis--> resolved  R foot pain  - s/p ray amputation right foot - Vanc and zosyn IV ABX, continue per ortho - Wound care per ortho - Preliminary superficial wound cultures: GNR   Abdominal/back pain with Dysuria  - With right sided abdominal pain and tender prostate concerned for prostatitis, renal calculi, bladder/kidney pathology.  - zosyn as above will cover likely organisms.  - UA:  -RBC TNTC and Many yeast  - Urine probe G/C: Negative  - CT abd/pelvis completed this morning: report pending  DM2  - Last A1C in 06/2011 was 6.5 --> 6.9 (now)  - Yeast dermatitis of penis: Diflucan 150 mg 11/20 and today - Hold metformin and glyburide  - SSI - Moderate   Psych: Inpatient at [facility] 3-4 months ago for 2 days  - Depression:  - Feeling increasingly depressed over the last three months  - Suicidal ideation:  - Patient denies current SI, however admits to recent SI  - Sitter at bedside  - Hallucinations  - Auditory hallucinations for last three months  - Continue Risperdal PRN, celexa, and trazadone  -  Psych consulted: Thank you for your recommendations.  Substance abuse (EtOH)  - CIWA --> has not needed intervention as of yet  - offer detox and counseling prior to discharge  - of note, patient did have UDS + cocaine 12/08/11 at [facility]  Diarrhea: --> resolved - if diarrhea continues will consider stool studies  - Probable antibiotic reaction  FEN/GI: Carb modified, NS at 125 mL/hr, KCl IVPG x4  Prophylaxis: SQ Lovenox, po PPI for possible EtOH  gastritis  Disposition: Med surg, when available  Code Status: Full    LOS: 4 days   Mitchell [name] 12/18/2011, 10:08 AM

## 2011-12-18 NOTE — Progress Notes (Signed)
PATIENT ID: Mitchell [name]        MRN:  [id number]          DOB/AGE: [birth year] / 36 y.o.    Norlene Campbell, MD   Jacqualine Code, PA-C [address] [place], [place]  [address]                             [phone]   PROGRESS NOTE  Subjective:    Tolerating Diet: yes         Patient reports pain as moderate.     Difficult to communicate because of language barrier  Objective: Vital signs in last 24 hours:   Patient Vitals for the past 24 hrs:  BP Temp Temp src Pulse Resp SpO2  12/18/11 0557 98/66 mmHg 98.3 F (36.8 C) Oral 61  20  99 %  12/17/11 2153 106/67 mmHg 98.2 F (36.8 C) - 81  18  96 %  12/17/11 1939 128/83 mmHg 97.8 F (36.6 C) Oral 70  18  96 %  12/17/11 1915 114/75 mmHg 98.2 F (36.8 C) - 74  19  96 %  12/17/11 1900 111/77 mmHg - - 72  21  96 %  12/17/11 1852 114/70 mmHg - - 73  18  96 %  12/17/11 1845 110/71 mmHg - - 77  19  96 %  12/17/11 1832 - 97.1 F (36.2 C) - - - 97 %  12/17/11 1453 121/77 mmHg 98.2 F (36.8 C) Oral 61  16  99 %  12/17/11 1200 - - - 65  - -      Intake/Output from previous day:   11/22 0701 - 11/23 0700 In: 1760 [P.O.:360; I.V.:1000] Out: 30    Intake/Output this shift:       Intake/Output      11/22 0701 - 11/23 0700 11/23 0701 - 11/24 0700   P.O. 360    I.V. (mL/kg) 1000 (14.2)    IV Piggyback 400    Total Intake(mL/kg) 1760 (25)    Blood 30    Total Output 30    Net +1730         Urine Occurrence 2 x    Stool Occurrence 1 x       LABORATORY DATA:  Basename 12/18/11 0625 12/17/11 0858 12/16/11 0919 12/16/11 0706 12/15/11 1232 12/14/11 1935 12/14/11 1234  WBC 4.0 2.8* 3.3* 3.8* 5.3 9.3 9.2  HGB 11.0* 13.0 12.1* 12.0* 12.7* 12.9* 14.0  HCT 30.7* 35.9* 32.9* 32.6* 34.9* 35.2* 38.1*  PLT 93* 77* 65* 67* 75* 99* 144*    Basename 12/18/11 0625 12/17/11 0858 12/16/11 0919 12/16/11 0706 12/15/11 0615 12/14/11 1935 12/14/11 1234  NA 138 141 135 139 139 -- 141  K 3.1* 3.3* 3.1* 3.1* 3.0* -- 3.1*  CL 105 106 102 104  105 -- 105  CO2 26 26 25 24 28  -- 24  BUN 6 5* 7 7 6  -- 4*  CREATININE 1.06 0.87 0.82 0.84 0.83 0.74 0.72  GLUCOSE 152* 140* 269* 134* 150* -- 170*  CALCIUM 7.9* 8.2* 7.7* 7.7* 7.6* -- 8.4   Lab Results  Component Value Date   INR 1.25 12/17/2011   INR 1.40 12/14/2011   INR 1.43 02/06/2011    Recent Radiographic Studies :  US Abdomen Complete  12/15/2011  *RADIOLOGY REPORT*  Clinical Data:  Evaluate for ascites  ABDOMINAL ULTRASOUND COMPLETE  Comparison:  12/20/2010  Findings: No evidence of ascites.  Gallbladder:  No gallstones, gallbladder wall thickening, or pericholecystic fluid.  Common Bile Duct:  Within normal limits in caliber.  Liver: No focal mass lesion identified.  The liver is heterogeneous and there is a suspected nodular contour.  IVC:  Appears normal.  Pancreas:  No abnormality identified.  Spleen:  The spleen is now mildly enlarged with a greatest dimension of 12.3 cm.  Previously, the largest dimension was 10.3 cm.  Right kidney:  Normal in size and parenchymal echogenicity.  No evidence of mass or hydronephrosis.  Left kidney:  Normal in size and parenchymal echogenicity.  No evidence of mass or hydronephrosis.  Abdominal Aorta:  No aneurysm identified.  IMPRESSION: Interval development of mild splenomegaly.  Suspected cirrhotic change of the liver.  CT or MR of the liver may be helpful to further characterize.   Original Report Authenticated By: Jolaine Click, M.D.    Mr Foot Right Wo Contrast  12/15/2011  *RADIOLOGY REPORT*  Clinical Data: Osteomyelitis.  MRI OF THE RIGHT FOREFOOT WITHOUT CONTRAST  Technique:  Multiplanar, multisequence MR imaging was performed. No intravenous contrast was administered.  Comparison: 12/14/2011.  Findings: Severe osteopenia.  Ankylosis between the second third metatarsal heads.  Ankylosis of the midfoot.  Ulceration is present over the eroded first metatarsal head.  There is phlegmon in the soft tissues extending up to the surface of bone and  cortical osteolysis along the plantar aspect of the first metatarsal head and neck.  Prior radiographs show the destroyed first MTP joint space and MRI confirms that with small dorsal joint effusion.  There is no ankylosis across the joint.  Erosive changes are present at the other MTP joints along with hammertoe.  Diffuse subcutaneous edema of the forefoot compatible with cellulitis or dependent edema.  Small midfoot joint effusions are present. Appears to be partial ankylosis of the first tarsometatarsal junction.  The no discrete soft tissue abscess is identified.  Bone marrow edema is present in the base of the proximal phalanx of the great toe, suggesting septic arthritis and associated osteomyelitis of the proximal phalanx.  IMPRESSION: Plantar foot ulceration overlying the first metatarsal head and neck with bone marrow edema and cortical osteolysis compatible with osteomyelitis.  Suspect first MTP septic arthritis and associated osteomyelitis of the proximal phalanx of the great toe.   Original Report Authenticated By: Andreas Newport, M.D.    Mr Ankle Right  Wo Contrast  12/15/2011  *RADIOLOGY REPORT*  Clinical Data: Osteomyelitis versus cellulitis.  Chronic right foot pain.  Fever.  MRI RIGHT ANKLE WITHOUT CONTRAST  Technique:  Multiplanar, multisequence MR imaging of the right ankle was performed.  No intravenous contrast was administered.  Comparison:  Radiographs 12/14/2011.  Findings:  Poly articular ankylosis is present.  The ankle joint remains open.  Subtalar joints are fused.  Midfoot joints are fused as well.  The talonavicular joint is severely degenerated and remains open.  Achilles tendon and plantar fascia are within normal limits.  There is no evidence of hind foot, distal leg or ankle osteomyelitis. Severe ankle osteoarthritis.  No osteochondral lesion of the talar dome.  The medial and lateral ankle stabilizing ligaments appear within normal limits.  Diminutive posteromedial and  posterolateral tendons, likely secondary ankylosis.  The anterior tendon group appears intact.  Diffuse edema is present in the distal leg and ankle, compatible with dependent edema or cellulitis in the appropriate clinical setting. Osteopenia is again noted.  Foot muscular atrophy.  IMPRESSION: 1.  Polyarticular ankylosis.  Question juvenile rheumatoid  arthritis.  Other chronic inflammatory arthritides could produce the same result. 2.  Severe ankle and talonavicular osteoarthritis. 3.  Diffuse leg and ankle edema which may represent dependent edema or cellulitis in the appropriate clinical setting. 4.  No osteomyelitis or abscess.   Original Report Authenticated By: Andreas Newport, M.D.    Dg Foot Complete Right  12/14/2011  *RADIOLOGY REPORT*  Clinical Data: Open wound on the plantar surface of the foot  RIGHT FOOT COMPLETE - 3+ VIEW  Comparison: Right foot films of 11/15  Findings: The bones of the right foot are osteopenic and there is chronic deformity of the hindfoot, midfoot, and forefoot.  However, no radiographic evidence of osteomyelitis is seen with certainty. If more sensitive assessment is warranted MRI would be recommended.  IMPRESSION: Osteopenia and chronic deformity.  No definite radiographic evidence of osteomyelitis.  Consider MRI if warranted.   Original Report Authenticated By: Dwyane Dee, M.D.    Dg Foot Complete Right  12/10/2011  *RADIOLOGY REPORT*  Clinical Data: Diabetic ulcer  RIGHT FOOT COMPLETE - 3+ VIEW  Comparison: 08/19/2011  Findings: Stable chronic deformity of the forefoot.  Diffuse osteopenia.  Mild soft tissue swelling.  Soft tissue ulceration along the plantar aspect at the level of the MTP joints.  No definite radiographic findings to suggest osteomyelitis.  IMPRESSION: No definite radiographic findings to suggest osteomyelitis.  Stable chronic deformity.   Original Report Authenticated By: Charline Bills, M.D.      Examination:  General appearance: alert and  moderate distress  Wound Exam: clean, dry, intact dressing  Drainage:  None  Vascular Exam: cap refill <2 sec  Assessment:    1 Day Post-Op  Procedure(s) (LRB): AMPUTATION RAY (Right)  ADDITIONAL DIAGNOSIS:  Principal Problem:  *Cellulitis of right foot Active Problems:  Osteomyelitis  Diabetes mellitus type 2 with complications  Yeast dermatitis of penis  Abdominal pain in male patient  Suicidal ideation  Depression  Diabetes mellitus    Plan: Continue pain mgmt and IV antibiotics Keep dressing on.       [facility] 12/18/2011 10:07 AM

## 2011-12-19 LAB — COMPREHENSIVE METABOLIC PANEL
BUN: 10 mg/dL (ref 6–23)
CO2: 24 mEq/L (ref 19–32)
Calcium: 7.9 mg/dL — ABNORMAL LOW (ref 8.4–10.5)
Creatinine, Ser: 1.83 mg/dL — ABNORMAL HIGH (ref 0.50–1.35)
GFR calc Af Amer: 53 mL/min — ABNORMAL LOW (ref >90)
GFR calc non Af Amer: 46 mL/min — ABNORMAL LOW (ref >90)
Glucose, Bld: 116 mg/dL — ABNORMAL HIGH (ref 70–99)
Total Protein: 6.3 g/dL (ref 6.0–8.3)

## 2011-12-19 LAB — CBC
Hemoglobin: 11.3 g/dL — ABNORMAL LOW (ref 13.0–17.0)
MCH: 33.4 pg (ref 26.0–34.0)
MCHC: 36.8 g/dL — ABNORMAL HIGH (ref 30.0–36.0)
MCV: 90.8 fL (ref 78.0–100.0)
RBC: 3.38 MIL/uL — ABNORMAL LOW (ref 4.22–5.81)

## 2011-12-19 LAB — GLUCOSE, CAPILLARY: Glucose-Capillary: 97 mg/dL (ref 70–99)

## 2011-12-19 MED ORDER — AMOXICILLIN-POT CLAVULANATE 875-125 MG PO TABS
1.0000 | ORAL_TABLET | Freq: Two times a day (BID) | ORAL | Status: DC
  Administered 2011-12-19 – 2011-12-20 (×3): 1 via ORAL
  Filled 2011-12-19 (×4): qty 1

## 2011-12-19 MED ORDER — POTASSIUM CHLORIDE CRYS ER 20 MEQ PO TBCR
40.0000 meq | EXTENDED_RELEASE_TABLET | Freq: Two times a day (BID) | ORAL | Status: DC
  Administered 2011-12-19 – 2011-12-20 (×3): 40 meq via ORAL
  Filled 2011-12-19 (×4): qty 2

## 2011-12-19 NOTE — Progress Notes (Signed)
Family Medicine Teaching Service Attending Note  I interviewed and examined patient Mitchell [name] and reviewed their tests and x-rays.  I discussed with Dr. Konrad Dolores and reviewed their note for today.  I agree with their assessment and plan.     Additionally  Awake alert no acute distress  Agree with discharge sitter if later today he has no suicidal ideation Change to oral antibiotics since never had systemic signs of infection Discharge nsaids with creat increase Will need outpatient urology follow up for gross hematuria

## 2011-12-19 NOTE — Progress Notes (Signed)
Family Medicine Teaching Service PGY-1 progress note  Subjective: Mitchell [name] 37 y.o.   NAEO. Feels better this am. Denies hematuria. r foot pain significantly better than yesterday. Able to put some pressure on foot w/ ambulation to bathroom. Denies feelings of depression or SI/HI.  Objective: Vital signs in last 24 hours: Temp:  [98.3 F (36.8 C)-99.8 F (37.7 C)] 98.3 F (36.8 C) (11/24 0616) Pulse Rate:  [65-85] 65  (11/24 0616) Resp:  [18-22] 18  (11/24 0616) BP: (115-128)/(70-82) 123/82 mmHg (11/24 0616) SpO2:  [97 %-98 %] 97 % (11/24 0616) Weight change:  Last BM Date: 12/18/11  Intake/Output from previous day: 11/23 0701 - 11/24 0700 In: 1600 [P.O.:1200; IV Piggyback:400] Out: -  Intake/Output this shift:   Gen: NAD HEENT: MMM, EOMI CV: RRR no m/r/g Abd: NABS. Tenderness in the epigastric to R flank region. Negative for murphy's sign and no pain at McBurny's point. No guarding Musc: R great toe amputated w/ dressings on place. R leg w/ LE scarring above dressings. <3 sec cap refill in toes. Dressings were left in place and are c/d/i Psych: pleasant. Normal thought present Neuro: CN grossly intact. Cerebellar fxn normal  Lab Results:  Basename 12/19/11 0500 12/18/11 0625  WBC 5.2 4.0  HGB 11.3* 11.0*  HCT 30.7* 30.7*  PLT 90* 93*   BMET  Basename 12/19/11 0500 12/18/11 0625  NA 140 138  K 3.3* 3.1*  CL 106 105  CO2 24 26  GLUCOSE 116* 152*  BUN 10 6  CREATININE 1.83* 1.06  CALCIUM 7.9* 7.9*   Medications: I have reviewed the patient's current medications.  Assessment/Plan: Mitchell [name] is a 36 y.o. year old male With PMH of DM2, Chronic R foot wounds, and EtOH abuse presenting with suicidal ideation, fever, abdominal pain and foot pain.  Sepsis--> resolved  R foot pain w/ osteomyelitis s/p ray amputation right foot on 12/17/11. On Vanc and Zosy since 11/19. Today is day six.  - DC Vanc and Zosyn. Start Augmentin - Wound care per ortho - Wound Cx  grew GNR  adn Staph A  Abdominal/back pain with Dysuria: Cirrhosis on CT w/ inadequate study of renal collecting ducts. Likely multifactorial, w/ concern for UTI, further hepatic damage, possible stone, and psychosomatic - continue IVF - ABX as above - Most recent UA concenring for possible UTI.   - Urine probe G/C: Negative   DM2: Last A1C in 06/2011 was 6.5 --> 6.9 (now)  - Yeast dermatitis of penis: Diflucan 150 mg x2 w/ last dose on 11/23 - Hold metformin and glyburide  - SSI - Moderate   Thrombocytopenia: Pt w/ apparent chronic thrombocytopenia. Stable today.  - continue to monitor w/ CBC.  - OK to conitnue lovenox at this time   Renal: elevated Cr today. Likely multifactorial including Vanc, NSAID administration, and likely recent insult from surgery, and chronic DM - Continue IVF - DC Vanc and Ibuprofen - Repeat BMET in am  Psych: Inpatient at [facility] 3-4 months ago for 2 days  - Depression:  - Denies feelings of depression on 11/24 - Suicidal ideation:  - Patient denies current SI, however admits to recent SI  - Sitter at bedside  - Hallucinations  - Auditory hallucinations for last three months. No hallucinations during this admission - Continue Risperdal PRN, celexa, and trazadone  - Psych consulted: Thank you for your recommendations.   Substance abuse (EtOH)  - CIWA --> has not needed intervention as of yet  - offer detox and  counseling prior to discharge  - of note, patient did have UDS + cocaine 12/08/11 at [facility] ED   Diarrhea: --> resolved - if diarrhea continues will consider stool studies  - Probable antibiotic reaction   FEN/GI: Carb modified, NS at 125 mL/hr,  - replete K w/ Kdur x1  Prophylaxis: SQ Lovenox, po PPI for possible EtOH gastritis   Disposition: Pending improvement   Code Status: Full    LOS: 5 days   MERRELL, DAVID Family Medicine Resident PGY-2 12/19/2011, 10:19 AM

## 2011-12-19 NOTE — Progress Notes (Signed)
Patient ID: Mitchell [name], male   DOB: 12/07/1975, 36 y.o.   MRN: [id number] PATIENT ID: Mitchell [name]        MRN:  [id number]          DOB/AGE: 25-Oct-1975 / 36 y.o.    Norlene Campbell, MD   Jacqualine Code, PA-C [address] [place], [place]  [address]                             [phone]   PROGRESS NOTE  Subjective:    States pain is better.  Only a little bit of pain.   Tolerating Diet: yes         Patient reports pain as mild.       Objective: Vital signs in last 24 hours:   Patient Vitals for the past 24 hrs:  BP Temp Temp src Pulse Resp SpO2  12/19/11 0616 123/82 mmHg 98.3 F (36.8 C) Oral 65  18  97 %  12/18/11 2100 128/72 mmHg 99.8 F (37.7 C) Oral 85  20  98 %  12/18/11 1500 115/70 mmHg 99 F (37.2 C) Oral 74  22  98 %      Intake/Output from previous day:   11/23 0701 - 11/24 0700 In: 1600 [P.O.:1200] Out: -    Intake/Output this shift:       Intake/Output      11/23 0701 - 11/24 0700 11/24 0701 - 11/25 0700   P.O. 1200    I.V. (mL/kg)     IV Piggyback 400    Total Intake(mL/kg) 1600 (22.7)    Blood     Total Output     Net +1600         Urine Occurrence 4 x    Stool Occurrence 1 x       LABORATORY DATA:  Basename 12/18/11 0625 12/17/11 0858 12/16/11 0919 12/16/11 0706 12/15/11 1232 12/14/11 1935 12/14/11 1234  WBC 4.0 2.8* 3.3* 3.8* 5.3 9.3 9.2  HGB 11.0* 13.0 12.1* 12.0* 12.7* 12.9* 14.0  HCT 30.7* 35.9* 32.9* 32.6* 34.9* 35.2* 38.1*  PLT 93* 77* 65* 67* 75* 99* 144*    Basename 12/19/11 0500 12/18/11 0625 12/17/11 0858 12/16/11 0919 12/16/11 0706 12/15/11 0615 12/14/11 1935 12/14/11 1234  NA 140 138 141 135 139 139 -- 141  K 3.3* 3.1* 3.3* 3.1* 3.1* 3.0* -- 3.1*  CL 106 105 106 102 104 105 -- 105  CO2 24 26 26 25 24 28  -- 24  BUN 10 6 5* 7 7 6  -- 4*  CREATININE 1.83* 1.06 0.87 0.82 0.84 0.83 0.74 --  GLUCOSE 116* 152* 140* 269* 134* 150* -- 170*  CALCIUM 7.9* 7.9* 8.2* 7.7* 7.7* 7.6* -- 8.4   Lab Results  Component Value Date   INR  1.25 12/17/2011   INR 1.40 12/14/2011   INR 1.43 02/06/2011    Recent Radiographic Studies :  Ct Abdomen Pelvis W Wo Contrast  12/18/2011  *RADIOLOGY REPORT*  Clinical Data: Severe right upper quadrant pain.  Hematuria. Recent toe amputation.  CT ABDOMEN AND PELVIS WITHOUT AND WITH CONTRAST  Technique:  Multidetector CT imaging of the abdomen and pelvis was performed without contrast material in one or both body regions, followed by contrast material(s) and further sections in one or both body regions.  Contrast: OMNIPAQUE IOHEXOL 300 MG/ML  SOLN  Comparison: Ultrasound of 12/15/2011 and 12/20/2010.  No prior CT.  Findings: Unenhanced images demonstrate no  renal calculi or hydronephrosis.    No hydroureter or ureteric calculi.  No bladder calculi.  Post contrast images demonstrate left greater than right bibasilar airspace disease.  Mild cardiomegaly, without pericardial or pleural effusion.  Moderate cirrhosis.  Capsular based calcification within the right lobe measures 6 mm on image 22/series 6 and is nonspecific.  No underlying mass identified.  Splenomegaly, with the spleen measuring nearly 16 cm cranial caudal.  Normal stomach, pancreas, gallbladder, biliary tract, adrenal glands.  No renal mass.  The contrast excretion of the renal collecting systems is poor.  The ureters are suboptimally opacified, without gross filling defect identified.  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  Likely secondary to impression by the median arcuate ligament.  There are prominent vessels in the left para-aortic station on image 56/series 6.  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  No retroperitoneal or retrocrural adenopathy.  Normal colon, appendix, and terminal ileum.  Normal small bowel without abdominal ascites.  A right external iliac node measures 1.4 cm on image 90/series 6. Prominent right inguinal node at 1.5 cm.  The bladder is not  unremarkable, but not well opacified with contrast. Normal prostate, without significant free pelvic fluid. Bilateral L5 pars defects.  IMPRESSION:  1. No acute process or explanation for hematuria.  Degraded evaluation of the collecting system, ureters and bladder secondary to suboptimal contrast opacification. 2.  Cirrhosis. 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 3.  Prominent right external iliac and inguinal nodes.  Possibly reactive, given the clinical history of recent right toe amputation. Depending on clinical concern, follow-up with abdominal pelvic CT at 3 months could be performed to confirm resolution. 4.  Bibasilar airspace disease.  Favor atelectasis.  The left lung base, infection or aspiration cannot be excluded.   Original Report Authenticated By: Jeronimo Greaves, M.D.    US Abdomen Complete  12/15/2011  *RADIOLOGY REPORT*  Clinical Data:  Evaluate for ascites  ABDOMINAL ULTRASOUND COMPLETE  Comparison:  12/20/2010  Findings: No evidence of ascites.  Gallbladder:  No gallstones, gallbladder wall thickening, or pericholecystic fluid.  Common Bile Duct:  Within normal limits in caliber.  Liver: No focal mass lesion identified.  The liver is heterogeneous and there is a suspected nodular contour.  IVC:  Appears normal.  Pancreas:  No abnormality identified.  Spleen:  The spleen is now mildly enlarged with a greatest dimension of 12.3 cm.  Previously, the largest dimension was 10.3 cm.  Right kidney:  Normal in size and parenchymal echogenicity.  No evidence of mass or hydronephrosis.  Left kidney:  Normal in size and parenchymal echogenicity.  No evidence of mass or hydronephrosis.  Abdominal Aorta:  No aneurysm identified.  IMPRESSION: Interval development of mild splenomegaly.  Suspected cirrhotic change of the liver.  CT or MR of the liver may be helpful to further characterize.   Original Report Authenticated By: Jolaine Click, M.D.    Mr Foot Right Wo Contrast  12/15/2011   *RADIOLOGY REPORT*  Clinical Data: Osteomyelitis.  MRI OF THE RIGHT FOREFOOT WITHOUT CONTRAST  Technique:  Multiplanar, multisequence MR imaging was performed. No intravenous contrast was administered.  Comparison: 12/14/2011.  Findings: Severe osteopenia.  Ankylosis between the second third metatarsal heads.  Ankylosis of the midfoot.  Ulceration is present over the eroded first metatarsal head.  There is phlegmon in the soft tissues extending up to the surface of bone and cortical osteolysis along the plantar aspect of the first metatarsal  head and neck.  Prior radiographs show the destroyed first MTP joint space and MRI confirms that with small dorsal joint effusion.  There is no ankylosis across the joint.  Erosive changes are present at the other MTP joints along with hammertoe.  Diffuse subcutaneous edema of the forefoot compatible with cellulitis or dependent edema.  Small midfoot joint effusions are present. Appears to be partial ankylosis of the first tarsometatarsal junction.  The no discrete soft tissue abscess is identified.  Bone marrow edema is present in the base of the proximal phalanx of the great toe, suggesting septic arthritis and associated osteomyelitis of the proximal phalanx.  IMPRESSION: Plantar foot ulceration overlying the first metatarsal head and neck with bone marrow edema and cortical osteolysis compatible with osteomyelitis.  Suspect first MTP septic arthritis and associated osteomyelitis of the proximal phalanx of the great toe.   Original Report Authenticated By: Andreas Newport, M.D.    Mr Ankle Right  Wo Contrast  12/15/2011  *RADIOLOGY REPORT*  Clinical Data: Osteomyelitis versus cellulitis.  Chronic right foot pain.  Fever.  MRI RIGHT ANKLE WITHOUT CONTRAST  Technique:  Multiplanar, multisequence MR imaging of the right ankle was performed.  No intravenous contrast was administered.  Comparison:  Radiographs 12/14/2011.  Findings:  Poly articular ankylosis is present.  The  ankle joint remains open.  Subtalar joints are fused.  Midfoot joints are fused as well.  The talonavicular joint is severely degenerated and remains open.  Achilles tendon and plantar fascia are within normal limits.  There is no evidence of hind foot, distal leg or ankle osteomyelitis. Severe ankle osteoarthritis.  No osteochondral lesion of the talar dome.  The medial and lateral ankle stabilizing ligaments appear within normal limits.  Diminutive posteromedial and posterolateral tendons, likely secondary ankylosis.  The anterior tendon group appears intact.  Diffuse edema is present in the distal leg and ankle, compatible with dependent edema or cellulitis in the appropriate clinical setting. Osteopenia is again noted.  Foot muscular atrophy.  IMPRESSION: 1.  Polyarticular ankylosis.  Question juvenile rheumatoid arthritis.  Other chronic inflammatory arthritides could produce the same result. 2.  Severe ankle and talonavicular osteoarthritis. 3.  Diffuse leg and ankle edema which may represent dependent edema or cellulitis in the appropriate clinical setting. 4.  No osteomyelitis or abscess.   Original Report Authenticated By: Andreas Newport, M.D.    Dg Foot Complete Right  12/14/2011  *RADIOLOGY REPORT*  Clinical Data: Open wound on the plantar surface of the foot  RIGHT FOOT COMPLETE - 3+ VIEW  Comparison: Right foot films of 11/15  Findings: The bones of the right foot are osteopenic and there is chronic deformity of the hindfoot, midfoot, and forefoot.  However, no radiographic evidence of osteomyelitis is seen with certainty. If more sensitive assessment is warranted MRI would be recommended.  IMPRESSION: Osteopenia and chronic deformity.  No definite radiographic evidence of osteomyelitis.  Consider MRI if warranted.   Original Report Authenticated By: Dwyane Dee, M.D.    Dg Foot Complete Right  12/10/2011  *RADIOLOGY REPORT*  Clinical Data: Diabetic ulcer  RIGHT FOOT COMPLETE - 3+ VIEW   Comparison: 08/19/2011  Findings: Stable chronic deformity of the forefoot.  Diffuse osteopenia.  Mild soft tissue swelling.  Soft tissue ulceration along the plantar aspect at the level of the MTP joints.  No definite radiographic findings to suggest osteomyelitis.  IMPRESSION: No definite radiographic findings to suggest osteomyelitis.  Stable chronic deformity.   Original Report Authenticated By: Charline Bills,  M.D.      Examination:  General appearance: alert, cooperative and mild distress  Wound Exam: clean, dry, intact dressing  Sensory Exam: remaining toes sensate   Vascular Exam: Cap refill < 2sec  Assessment:    2 Days Post-Op  Procedure(s) (LRB): AMPUTATION RAY (Right)  ADDITIONAL DIAGNOSIS:  Principal Problem:  *Cellulitis of right foot Active Problems:  Osteomyelitis  Diabetes mellitus type 2 with complications  Yeast dermatitis of penis  Abdominal pain in male patient  Suicidal ideation  Depression  Diabetes mellitus  Elevation of creatinine Hypokalemia   Plan: Continue OOB as tolerated Continue IV antibiotics Further orders per medicine   [facility] 12/19/2011 9:08 AM

## 2011-12-19 NOTE — Clinical Social Work Note (Signed)
CSW consulted re: DSS paperwork. CSW met with the patient at bedside and was provided DSS paperwork. This CSW will pass the paperwork on to the weekday CSW.   Lia Foyer, LCSWA [facility] Clinical Social Worker Contact #: [phone] (weekend)

## 2011-12-20 ENCOUNTER — Encounter (HOSPITAL_COMMUNITY): Payer: Self-pay | Admitting: Orthopedic Surgery

## 2011-12-20 LAB — CBC
HCT: 31.2 % — ABNORMAL LOW (ref 39.0–52.0)
Hemoglobin: 11.3 g/dL — ABNORMAL LOW (ref 13.0–17.0)
MCH: 32.6 pg (ref 26.0–34.0)
MCHC: 36.2 g/dL — ABNORMAL HIGH (ref 30.0–36.0)

## 2011-12-20 LAB — GLUCOSE, CAPILLARY: Glucose-Capillary: 128 mg/dL — ABNORMAL HIGH (ref 70–99)

## 2011-12-20 LAB — BASIC METABOLIC PANEL
BUN: 8 mg/dL (ref 6–23)
Creatinine, Ser: 1.41 mg/dL — ABNORMAL HIGH (ref 0.50–1.35)
GFR calc non Af Amer: 62 mL/min — ABNORMAL LOW (ref >90)
Glucose, Bld: 152 mg/dL — ABNORMAL HIGH (ref 70–99)
Potassium: 3.8 mEq/L (ref 3.5–5.1)

## 2011-12-20 MED ORDER — AMOXICILLIN-POT CLAVULANATE 875-125 MG PO TABS: 1.0000 | ORAL_TABLET | Freq: Two times a day (BID) | ORAL | Status: DC

## 2011-12-20 MED ORDER — PANTOPRAZOLE SODIUM 20 MG PO TBEC: 20.0000 mg | DELAYED_RELEASE_TABLET | Freq: Every day | ORAL | Status: DC

## 2011-12-20 MED ORDER — GI COCKTAIL ~~LOC~~
30.0000 mL | Freq: Once | ORAL | Status: AC
  Administered 2011-12-20: 30 mL via ORAL
  Filled 2011-12-20: qty 30

## 2011-12-20 MED ORDER — GLUCERNA SHAKE PO LIQD
237.0000 mL | Freq: Two times a day (BID) | ORAL | Status: DC
  Administered 2011-12-20: 237 mL via ORAL

## 2011-12-20 NOTE — Progress Notes (Signed)
Nutrition Follow-up  Intervention:   1. Glucerna Shake po BID, each supplement provides 220 kcal and 10 grams of protein.  2. RD will continue to follow    Assessment:   Pt with diarrhea per notes, nausea continues and PO intake is poor. Continues to have abdominal/back pain, question hepatic damage, possible stone.   S/p amputation of 1st ray right foot day 3. D/c'd from ortho services.    Diet Order:  Carb Mod PO intake: variable 25-50%  Meds: Scheduled Meds:   . amoxicillin-clavulanate  1 tablet Oral Q12H  . citalopram  40 mg Oral Daily  . enoxaparin (LOVENOX) injection  40 mg Subcutaneous Q24H  . [COMPLETED] gi cocktail  30 mL Oral Once  . insulin aspart  0-5 Units Subcutaneous QHS  . insulin aspart  0-9 Units Subcutaneous TID WC  . pantoprazole  20 mg Oral Daily  . potassium chloride  40 mEq Oral BID  . traZODone  50-100 mg Oral QHS  . [DISCONTINUED] fluconazole  150 mg Oral Daily  . [DISCONTINUED] folic acid  1 mg Oral Daily  . [DISCONTINUED] magnesium oxide  400 mg Oral BID  . [DISCONTINUED] thiamine  100 mg Intravenous Daily  . [DISCONTINUED] thiamine  100 mg Oral Daily   Continuous Infusions:  PRN Meds:.acetaminophen, acetaminophen, risperiDONE   CMP     Component Value Date/Time   NA 136 12/20/2011 1018   K 3.8 12/20/2011 1018   CL 102 12/20/2011 1018   CO2 26 12/20/2011 1018   GLUCOSE 152* 12/20/2011 1018   BUN 8 12/20/2011 1018   CREATININE 1.41* 12/20/2011 1018   CALCIUM 8.5 12/20/2011 1018   PROT 6.3 12/19/2011 0500   ALBUMIN 2.2* 12/19/2011 0500   AST 27 12/19/2011 0500   ALT 20 12/19/2011 0500   ALKPHOS 73 12/19/2011 0500   BILITOT 0.6 12/19/2011 0500   GFRNONAA 62* 12/20/2011 1018   GFRAA 72* 12/20/2011 1018    CBG (last 3)   Basename 12/20/11 1147 12/20/11 0751 12/19/11 2154  GLUCAP 128* 126* 215*     Intake/Output Summary (Last 24 hours) at 12/20/11 1454 Last data filed at 12/20/11 0900  Gross per 24 hour  Intake    222 ml    Output      0 ml  Net    222 ml    Weight Status:  155 lbs (11/21), stable with admission weight   Re-estimated needs:  2000-2200 kcal, 70-80 gm protein   Nutrition Dx:   -Inadequate oral intake (NI-2.1) r/t inability to tolerate solid foods AEB vomiting after all meals per pt report    Goal:  Goal: PO intake to meet >/=90% estimated nutrition needs . --unmet   Monitor:  PO intake, weight, labs   Clarene Duke RD, LDN Pager [phone] After Hours pager [phone]

## 2011-12-20 NOTE — Progress Notes (Signed)
Physical Therapy Treatment Patient Details Name: Mitchell [name] MRN: [id number] DOB: Sep 05, 1975 Today's Date: 12/20/2011 Time: 7829-5621 PT Time Calculation (min): 8 min  PT Assessment / Plan / Recommendation Comments on Treatment Session  Pt with toe amp on rt.  Pt moving well with crutches.    Follow Up Recommendations  No PT follow up;Supervision - Intermittent     Does the patient have the potential to tolerate intense rehabilitation     Barriers to Discharge        Equipment Recommendations  None recommended by PT (Pt has crutches.)    Recommendations for Other Services    Frequency Min 4X/week   Plan Discharge plan needs to be updated;Frequency remains appropriate    Precautions / Restrictions Restrictions RLE Weight Bearing: Non weight bearing   Pertinent Vitals/Pain     Mobility  Bed Mobility Bed Mobility: Sit to Supine Supine to Sit: 7: Independent Sitting - Scoot to Edge of Bed: 7: Independent Sit to Supine: 7: Independent Transfers Sit to Stand: 6: Modified independent (Device/Increase time);With upper extremity assist;From bed Stand to Sit: 6: Modified independent (Device/Increase time);With upper extremity assist;To bed Details for Transfer Assistance: No cues needed. Ambulation/Gait Ambulation/Gait Assistance: 5: Supervision Ambulation Distance (Feet): 60 Feet Assistive device: Crutches Ambulation/Gait Assistance Details: Pt with appropriate use of crutches. Able to maintain NWB. Gait Pattern: Step-through pattern    Exercises     PT Diagnosis:    PT Problem List:   PT Treatment Interventions:     PT Goals Acute Rehab PT Goals PT Goal: Supine/Side to Sit - Progress: Met PT Goal: Sit to Supine/Side - Progress: Met PT Goal: Sit to Stand - Progress: Met PT Goal: Stand to Sit - Progress: Met PT Goal: Ambulate - Progress: Met  Visit Information  Last PT Received On: 12/20/11 Assistance Needed: +1    Subjective Data  Subjective: Pt communicated  that he had crutches at home.   Cognition  Overall Cognitive Status: Difficult to assess Difficult to assess due to: Non-English speaking Arousal/Alertness: Awake/alert Orientation Level: Appears intact for tasks assessed Behavior During Session: [facility] for tasks performed    Balance     End of Session PT - End of Session Activity Tolerance: Patient tolerated treatment well Patient left: in bed;with call bell/phone within reach   GP     [facility] 12/20/2011, 3:46 PM  [facility] PT [phone]

## 2011-12-20 NOTE — Progress Notes (Signed)
Interpreter Mykiah Schmuck Namihira for [facility] 

## 2011-12-20 NOTE — Progress Notes (Signed)
NURSING PROGRESS NOTE  Gurkirat Basher [phone] Discharge Data: 12/20/2011 5:20 PM Attending Provider: No att. providers found KGM:WNUUV,OZDG, MD     Billie Ruddy to be D/C'd Home per MD order.  Discussed with the patient and family the After Visit Summary and all questions fully answered using spanish interpreter. All IV's discontinued with no bleeding noted. All belongings returned to patient for patient to take home.   Last Vital Signs:  Blood pressure 116/77, pulse 69, temperature 99.1 F (37.3 C), temperature source Oral, resp. rate 20, height 5\' 4"  (1.626 m), weight 70.4 kg (155 lb 3.3 oz), SpO2 99.00%.  Discharge Medication List   Medication List     As of 12/20/2011  5:20 PM    STOP taking these medications         ibuprofen 600 MG tablet   Commonly known as: ADVIL,MOTRIN      magnesium oxide 400 MG tablet   Commonly known as: MAG-OX      TAKE these medications         amoxicillin-clavulanate 875-125 MG per tablet   Commonly known as: AUGMENTIN   Take 1 tablet by mouth every 12 (twelve) hours.      citalopram 40 MG tablet   Commonly known as: CELEXA   Take 40 mg by mouth daily.      glimepiride 4 MG tablet   Commonly known as: AMARYL   Take 1 tablet (4 mg total) by mouth daily before breakfast.      metFORMIN 1000 MG tablet   Commonly known as: GLUCOPHAGE   Take 1 tablet (1,000 mg total) by mouth 2 (two) times daily with a meal. For diabetes      multivitamin with minerals Tabs   Take 1 tablet by mouth daily.      pantoprazole 20 MG tablet   Commonly known as: PROTONIX   Take 1 tablet (20 mg total) by mouth daily.      risperiDONE 0.5 MG tablet   Commonly known as: RISPERDAL   Take 1 tablet (0.5 mg total) by mouth at bedtime.      traZODone 50 MG tablet   Commonly known as: DESYREL   Take 50-100 mg by mouth at bedtime.        [facility], Elmarie Mainland, RN

## 2011-12-20 NOTE — Progress Notes (Signed)
Patient ID: Mitchell [name], male   DOB: May 28, 1975, 36 y.o.   MRN: [id number] Postoperative day 3 right foot first ray amputation. Patient is okay for discharge from orthopedic standpoint. All followup in 2 weeks. Touchdown weightbearing on the right. Change dressing as needed.

## 2011-12-20 NOTE — Progress Notes (Signed)
FMTS Attending Daily Note: Romaldo Saville MD [phone] pager office [phone] I  have seen and examined this patient, reviewed their chart. I have discussed this patient with the resident. I agree with the resident's findings, assessment and care plan. 

## 2011-12-20 NOTE — Progress Notes (Signed)
Subjective  Patient is doing well today. Laying in bed. Sitter still present at bedside. Patient has no complaints. He is wondering if he can go hmoe today. He reports he is only in a small amount of pain. He is still having dysuria, but it is not as bad. He reports his urine is normal color. His abdominal pain is minimal and RUQ only. He is still having diarrhea x4 a day since the day prior to surgery. He denies discoloration or bloody stools. He is nauseated and still has difficulty eating. He is only eating very small amounts. Objective: Vital signs in last 24 hours: Temp:  [98.5 F (36.9 C)-99 F (37.2 C)] 98.7 F (37.1 C) (11/25 0515) Pulse Rate:  [63-75] 71  (11/25 0515) Resp:  [20] 20  (11/25 0515) BP: (122-126)/(78-81) 126/81 mmHg (11/25 0515) SpO2:  [99 %-100 %] 100 % (11/25 0515) Weight change:  Last BM Date: 12/19/11  Intake/Output from previous day:   Intake/Output this shift:   Gen: NAD  HEENT: MMM, EOMI  CV: RRR no m/r/g  Abd: soft, ND. Tenderness in the RUQ.   Musc: R great toe amputated w/ dressings on place. R leg w/ LE scarring above dressings. <3 sec cap refill in toes. Dressings were left in place and are c/d/i  Psych: pleasant. Normal thought present  Neuro: CN grossly intact.  Lab Results:  Basename 12/19/11 0500 12/18/11 0625  WBC 5.2 4.0  HGB 11.3* 11.0*  HCT 30.7* 30.7*  PLT 90* 93*   BMET  Basename 12/19/11 0500 12/18/11 0625  NA 140 138  K 3.3* 3.1*  CL 106 105  CO2 24 26  GLUCOSE 116* 152*  BUN 10 6  CREATININE 1.83* 1.06  CALCIUM 7.9* 7.9*    Studies/Results: Ct Abdomen Pelvis W Wo Contrast  12/18/2011  *RADIOLOGY REPORT  IMPRESSION:  1. No acute process or explanation for hematuria.  Degraded evaluation of the collecting system, ureters and bladder secondary to suboptimal contrast opacification. 2.  Cirrhosis. 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 3.  Prominent right external iliac and inguinal nodes.  Possibly  reactive, given the clinical history of recent right toe amputation. Depending on clinical concern, follow-up with abdominal pelvic CT at 3 months could be performed to confirm resolution. 4.  Bibasilar airspace disease.  Favor atelectasis.  The left lung base, infection or aspiration cannot be excluded.   Original Report Authenticated By: Jeronimo Greaves, M.D.    Medications: I have reviewed the patient's current medications.  Assessment/Plan:  Mitchell [name] is a 36 y.o. year old male With PMH of DM2, Chronic R foot wounds, and EtOH abuse presenting with suicidal ideation, fever, abdominal pain and foot pain.  Sepsis--> resolved  R foot pain w/ osteomyelitis s/p ray amputation right foot on 12/17/11. On Vanc and Zosy since 11/19. Today is day six.  - DC Vanc and Zosyn. Start Augmentin  - Wound care per ortho; released to home today per ortho.  - Wound Cx grew GNR and Staph A  Abdominal/back pain with Dysuria: Cirrhosis on CT w/ inadequate study of renal collecting ducts. Likely multifactorial, w/ concern for UTI, further hepatic damage, possible stone, and psychosomatic  - continue IVF  - Urine probe G/C: Negative  DM2: Last A1C in 06/2011 was 6.5 --> 6.9 (now)  - Yeast dermatitis of penis: Diflucan 150 mg x2 w/ last dose on 11/23  - Hold metformin and glyburide  - SSI - Moderate  Thrombocytopenia: Pt w/ apparent chronic thrombocytopenia. Stable today.  -  continue to monitor w/ CBC.  - OK to conitnue lovenox at this time  Renal: elevated Cr today. Likely multifactorial including Vanc, NSAID administration, and likely recent insult from surgery, and chronic DM  - Continue IVF  - DC Vanc, Zosyn and Ibuprofen  - Repeat BMET in am  Psych: Inpatient at [facility] 3-4 months ago for 2 days  - Depression:  - Denies feelings of depression on 11/24  - Suicidal ideation:  - Patient denies current SI, however admits to recent SI  - Sitter at bedside discontinued - Hallucinations  - Auditory hallucinations  for last three months. No hallucinations during this admission  - Continue Risperdal PRN, celexa, and trazadone  - Psych consulted: Thank you for your recommendations.  Substance abuse (EtOH)  - CIWA --> has not needed intervention as of yet  - offer detox and counseling prior to discharge  - of note, patient did have UDS + cocaine 12/08/11 at [facility] ED  Diarrhea: --> resolved  - if diarrhea continues will consider stool studies  - Probable antibiotic reaction  FEN/GI: Carb modified, NS at 125 mL/hr,  - replete K w/ Kdur x2  Prophylaxis: SQ Lovenox, po PPI for possible EtOH gastritis  Disposition: Probable discharge today, pending psych Code Status: Full    LOS: 6 days   Mitchell [name] 12/20/2011, 9:27 AM

## 2011-12-21 LAB — CULTURE, BLOOD (ROUTINE X 2)
Culture: NO GROWTH
Culture: NO GROWTH

## 2011-12-21 MED ORDER — MIDAZOLAM HCL 5 MG/5ML IJ SOLN
INTRAMUSCULAR | Status: DC | PRN
  Administered 2011-12-17: 2 mg via INTRAVENOUS

## 2011-12-21 NOTE — Discharge Summary (Signed)
I have reviewed this discharge summary and agree.    

## 2011-12-21 NOTE — Addendum Note (Signed)
Addendum  created 12/21/11 1301 by Sharlet Salina, CRNA   Modules edited:Anesthesia Medication Administration

## 2011-12-27 ENCOUNTER — Ambulatory Visit: Payer: Self-pay | Admitting: Family Medicine

## 2011-12-27 ENCOUNTER — Telehealth: Payer: Self-pay | Admitting: *Deleted

## 2011-12-27 NOTE — Telephone Encounter (Signed)
[facility] called this AM stating patient came to their office .  He was not scheduled with them  but was given appointment time today in [facility] office. Dr. Pearletha Forge however thinks patient was meant to follow up with Ortho Surgeon. I called Dr. Audrie Lia office and he needs hospital follow up with them  . They scheduled appointment for him today at 12:00. Marines , interpretor,  will call patient to inform.

## 2011-12-28 ENCOUNTER — Other Ambulatory Visit: Payer: Self-pay | Admitting: *Deleted

## 2011-12-28 MED ORDER — RISPERIDONE 0.5 MG PO TABS: 0.5000 mg | ORAL_TABLET | Freq: Every day | ORAL | Status: DC

## 2011-12-28 MED ORDER — PANTOPRAZOLE SODIUM 20 MG PO TBEC: 20.0000 mg | DELAYED_RELEASE_TABLET | Freq: Every day | ORAL | Status: DC

## 2011-12-28 MED ORDER — GLIMEPIRIDE 4 MG PO TABS: 4.0000 mg | ORAL_TABLET | Freq: Every day | ORAL | Status: DC

## 2011-12-28 MED ORDER — OMEPRAZOLE 20 MG PO CPDR: 20.0000 mg | DELAYED_RELEASE_CAPSULE | Freq: Every day | ORAL | Status: DC

## 2011-12-28 MED ORDER — METFORMIN HCL 1000 MG PO TABS: 1000.0000 mg | ORAL_TABLET | Freq: Two times a day (BID) | ORAL | Status: DC

## 2011-12-28 MED ORDER — ADULT MULTIVITAMIN W/MINERALS CH: 1.0000 | ORAL_TABLET | Freq: Every day | ORAL | Status: DC

## 2011-12-28 NOTE — Addendum Note (Signed)
Addended by: Salomon Mast on: 12/28/2011 09:58 AM   Modules accepted: Orders

## 2011-12-28 NOTE — Telephone Encounter (Signed)
Pharmacy calling requesting change in prescription for Protonix 20 mg.  States it will cost the patient $90 which he can not afford.  Asking if Dr. Louanne Belton is willing to change to Omeprazole  20 mg which will cost $24.  Paged to Dr. Louanne Belton.  Verbal order given to change prescription.  When I went to sign new Rx, I got a high alert warning about Omperazole and  Celexa.  Will forward to Dr. Louanne Belton for further review.  Ileana Ladd

## 2011-12-28 NOTE — Telephone Encounter (Signed)
RX for Risperidone  was not sent electronically." Print " was selected . I called RX into pharmacy and verbally gave to pharmacist..

## 2011-12-28 NOTE — Telephone Encounter (Signed)
Patient comes to office today stating her does not have any of his meds except for antibiotic, citalopram, and trazodone.    Refills sent to pharmacy for metformin,glimepiride, multivitamin, and protonix   Wants refill on Risperidone but this was not prescribed from our office. Will send message to Dr. Louanne Belton to please advise.

## 2011-12-29 ENCOUNTER — Encounter: Payer: Self-pay | Admitting: Family Medicine

## 2011-12-29 ENCOUNTER — Ambulatory Visit (INDEPENDENT_AMBULATORY_CARE_PROVIDER_SITE_OTHER): Payer: No Typology Code available for payment source | Admitting: Family Medicine

## 2011-12-29 VITALS — BP 129/79 | HR 81 | Temp 98.6°F | Ht 64.0 in | Wt 159.0 lb

## 2011-12-29 DIAGNOSIS — E118 Type 2 diabetes mellitus with unspecified complications: Secondary | ICD-10-CM

## 2011-12-29 DIAGNOSIS — F329 Major depressive disorder, single episode, unspecified: Secondary | ICD-10-CM

## 2011-12-29 DIAGNOSIS — M869 Osteomyelitis, unspecified: Secondary | ICD-10-CM

## 2011-12-29 MED ORDER — CITALOPRAM HYDROBROMIDE 40 MG PO TABS: 40.0000 mg | ORAL_TABLET | Freq: Every day | ORAL | Status: DC

## 2011-12-29 MED ORDER — OMEPRAZOLE 20 MG PO CPDR: 20.0000 mg | DELAYED_RELEASE_CAPSULE | Freq: Every day | ORAL | Status: DC

## 2011-12-29 MED ORDER — METFORMIN HCL 1000 MG PO TABS: 1000.0000 mg | ORAL_TABLET | Freq: Two times a day (BID) | ORAL | Status: DC

## 2011-12-29 MED ORDER — RISPERIDONE 0.5 MG PO TABS: 0.5000 mg | ORAL_TABLET | Freq: Every day | ORAL | Status: DC

## 2011-12-29 MED ORDER — GLIMEPIRIDE 4 MG PO TABS: 4.0000 mg | ORAL_TABLET | Freq: Every day | ORAL | Status: DC

## 2011-12-29 MED ORDER — TRAZODONE HCL 50 MG PO TABS: 50.0000 mg | ORAL_TABLET | Freq: Every day | ORAL | Status: DC

## 2011-12-29 NOTE — Progress Notes (Signed)
Patient ID: Mitchell [name], male   DOB: [birth year], 36 y.o.   MRN: [id number] Subjective: The patient is a 36 y.o. year old male who presents today for hfu.  1. Diabetes:  Highs in upper 100s, no lows below 70.    2. Osteomyelitis: s/p ray excision.  Still on Abx.  No fevers/chills, no significant drainage.  Continued pain in right foot at surgical site.  3. Depression: The patient does not report any problems with depressed mood at this point in time. He is now back with his wife which is helped his mood significantly. His wife also agrees that he does not have current problems with depressed mood. He continues on his Celexa, risperidone, and trazodone. He is open to the idea of counseling.  4. Alcohol abuse: The patient reports that he will not be drinking anymore. He says that he has stopped in the past. He understands that this aggravates both his mood and his current liver problems. (The patient was diagnosed with alcoholic liver disease while recently in the hospital) he has been involved with Alcoholics Anonymous in the past and would consider being involved with them again.  Patient's past medical, social, and family history were reviewed and updated as appropriate. History  Substance Use Topics  . Smoking status: Never Smoker   . Smokeless tobacco: Never Used  . Alcohol Use: No     Comment: 12/15/2011 "3 packages of 12 beers/wk", has been abstinant following hospital admission 12/2011   Objective:  Filed Vitals:   12/29/11 1516  BP: 129/79  Pulse: 81  Temp: 98.6 F (37 C)   Gen: No acute distress CV: Regular rate and rhythm Resp: Clear to auscultation bilaterally Ext: Right foot has a healing surgical incision on the medial aspect. Is missing the great toe. There is some dried blood present over the incision site. There is no warmth, erythema, or purulent drainage.  Assessment/Plan:  Please also see individual problems in problem list for problem-specific plans.

## 2011-12-29 NOTE — Patient Instructions (Addendum)
It was great to see you today! I want you to call the clinic in [place] and find out what services they offer.  Write them down and bring the list with you when you see me again so we can decide if you should go to see them. I would highly recommend that you find an Alcoholics Anonamous group in [place] to go to. I am giving you printed prescriptions to take to the pharmacy. Our social worker, Theresia Bough, will be giving you a call.  I am also giving you her card. Please come back to see me in about a week and a half so I can see how your foot is doing.  Fue genial ver a usted hoy! Quiero que llames a la Clinical cytogeneticist y Financial risk analyst cules son los servicios que ofrecen. Escrbalas y llevar la lista con usted cuando me ves de nuevo para que podamos decidir si hay que ir a verlos. Yo recomendara que usted encuentra un grupo de Alcohlicos Anonamous en [place]. Te estoy dando recetas impresas para llevar a la farmacia. Arletta Bale social, Theresia Bough, estar dndole una llamada. Yo tambin le estoy dando su tarjeta. Por favor, vuelva a verme en una semana y media para que pueda ver cmo su pie est haciendo.

## 2012-01-07 ENCOUNTER — Encounter: Payer: Self-pay | Admitting: Family Medicine

## 2012-01-07 ENCOUNTER — Ambulatory Visit (INDEPENDENT_AMBULATORY_CARE_PROVIDER_SITE_OTHER): Payer: No Typology Code available for payment source | Admitting: Family Medicine

## 2012-01-07 VITALS — BP 121/75 | HR 73 | Temp 97.4°F | Ht 64.0 in | Wt 162.5 lb

## 2012-01-07 DIAGNOSIS — E118 Type 2 diabetes mellitus with unspecified complications: Secondary | ICD-10-CM

## 2012-01-07 DIAGNOSIS — F329 Major depressive disorder, single episode, unspecified: Secondary | ICD-10-CM

## 2012-01-07 DIAGNOSIS — M869 Osteomyelitis, unspecified: Secondary | ICD-10-CM

## 2012-01-07 NOTE — Assessment & Plan Note (Signed)
Patient's surgical site appears to be healing well. He'll continue to follow up with orthopedics. I plan to follow him very closely for some time to make certain that no evidence of infection remains.

## 2012-01-07 NOTE — Assessment & Plan Note (Signed)
Continue current medications. I will have our social worker contact him to discuss counseling and counseling options.

## 2012-01-07 NOTE — Assessment & Plan Note (Signed)
From the patient's report her blood sugar numbers this appears to be relatively well-controlled note his infection is under control. I will not make any changes in his medications at this time.

## 2012-01-10 ENCOUNTER — Telehealth: Payer: Self-pay | Admitting: Clinical

## 2012-01-10 NOTE — Telephone Encounter (Signed)
Clinical Child psychotherapist (CSW) received a referral to provide pt with counseling resources. CSW left a message with pt spouse who stated she was at work and would relay the message to pt.   Theresia Bough, MSW, Theresia Majors [phone]

## 2012-01-11 ENCOUNTER — Telehealth: Payer: Self-pay | Admitting: Clinical

## 2012-01-11 NOTE — Telephone Encounter (Signed)
Clinical Child psychotherapist (CSW) received a call back from pt. CSW explored how pt has been doing since discharging from the hospital. Pt stated he has been doing well and is currently living with his girlfriend. CSW explored pt thoughts of SI and feelings of depression. Pt denied any active suicidal thoughts or feelings of depression. CSW explored whether pt ever followed up with the resources given to him while in the hospital. Pt stated he did not follow up and lost the contact information given to him. CSW encouraged pt to follow up with [facility] [place] for outpatient counseling. CSW also informed pt that though CSW is unable to see pt regularly CSW can still be available for appointments. Pt plans to follow up with [facility] [place] and will also consider contacting CSW in the future.  Theresia Bough, MSW, Theresia Majors [phone]

## 2012-01-11 NOTE — Telephone Encounter (Signed)
Clinical Child psychotherapist (CSW) received a call back from pt. CSW explored how pt has been doing since discharging from the hospital. Pt stated he has been doing well and is currently living with his girlfriend. CSW explored pt thoughts of SI and feelings of depression. Pt denied any active suicidal thoughts or feelings of depression. CSW explored whether pt ever followed up with the resources given to him while in the hospital. Pt stated he did not and lost the contact information given to him. CSW encouraged pt to follow up with [facility] [place] for outpatient counseling. CSW also informed pt that thought

## 2012-01-14 ENCOUNTER — Emergency Department (HOSPITAL_COMMUNITY)
Admission: EM | Admit: 2012-01-14 | Discharge: 2012-01-15 | Disposition: A | Discharge status: Home / Self Care | Payer: No Typology Code available for payment source | Attending: Emergency Medicine | Admitting: Emergency Medicine

## 2012-01-14 ENCOUNTER — Encounter (HOSPITAL_COMMUNITY): Payer: Self-pay | Admitting: *Deleted

## 2012-01-14 DIAGNOSIS — Z8719 Personal history of other diseases of the digestive system: Secondary | ICD-10-CM | POA: Insufficient documentation

## 2012-01-14 DIAGNOSIS — Z79899 Other long term (current) drug therapy: Secondary | ICD-10-CM | POA: Insufficient documentation

## 2012-01-14 DIAGNOSIS — R45851 Suicidal ideations: Secondary | ICD-10-CM | POA: Insufficient documentation

## 2012-01-14 DIAGNOSIS — E119 Type 2 diabetes mellitus without complications: Secondary | ICD-10-CM | POA: Insufficient documentation

## 2012-01-14 DIAGNOSIS — F10929 Alcohol use, unspecified with intoxication, unspecified: Secondary | ICD-10-CM

## 2012-01-14 DIAGNOSIS — Z792 Long term (current) use of antibiotics: Secondary | ICD-10-CM | POA: Insufficient documentation

## 2012-01-14 DIAGNOSIS — F1010 Alcohol abuse, uncomplicated: Secondary | ICD-10-CM | POA: Insufficient documentation

## 2012-01-14 DIAGNOSIS — F3289 Other specified depressive episodes: Secondary | ICD-10-CM | POA: Insufficient documentation

## 2012-01-14 DIAGNOSIS — K219 Gastro-esophageal reflux disease without esophagitis: Secondary | ICD-10-CM | POA: Insufficient documentation

## 2012-01-14 DIAGNOSIS — F329 Major depressive disorder, single episode, unspecified: Secondary | ICD-10-CM | POA: Insufficient documentation

## 2012-01-14 DIAGNOSIS — F489 Nonpsychotic mental disorder, unspecified: Secondary | ICD-10-CM | POA: Insufficient documentation

## 2012-01-14 DIAGNOSIS — R4182 Altered mental status, unspecified: Secondary | ICD-10-CM | POA: Insufficient documentation

## 2012-01-14 DIAGNOSIS — Z8739 Personal history of other diseases of the musculoskeletal system and connective tissue: Secondary | ICD-10-CM | POA: Insufficient documentation

## 2012-01-14 LAB — CBC WITH DIFFERENTIAL/PLATELET
Eosinophils Relative: 3 % (ref 0–5)
Hemoglobin: 12.7 g/dL — ABNORMAL LOW (ref 13.0–17.0)
Lymphocytes Relative: 42 % (ref 12–46)
Lymphs Abs: 4.3 10*3/uL — ABNORMAL HIGH (ref 0.7–4.0)
MCH: 32.2 pg (ref 26.0–34.0)
MCV: 87.8 fL (ref 78.0–100.0)
Monocytes Relative: 7 % (ref 3–12)
Neutrophils Relative %: 48 % (ref 43–77)
Platelets: 133 10*3/uL — ABNORMAL LOW (ref 150–400)
RBC: 3.94 MIL/uL — ABNORMAL LOW (ref 4.22–5.81)
WBC: 10.3 10*3/uL (ref 4.0–10.5)

## 2012-01-14 LAB — COMPREHENSIVE METABOLIC PANEL
ALT: 40 U/L (ref 0–53)
Alkaline Phosphatase: 92 U/L (ref 39–117)
BUN: 13 mg/dL (ref 6–23)
CO2: 22 mEq/L (ref 19–32)
GFR calc Af Amer: >90 mL/min (ref >90)
GFR calc non Af Amer: >90 mL/min (ref >90)
Glucose, Bld: 225 mg/dL — ABNORMAL HIGH (ref 70–99)
Potassium: 3.6 mEq/L (ref 3.5–5.1)
Sodium: 141 mEq/L (ref 135–145)

## 2012-01-14 LAB — ETHANOL: Alcohol, Ethyl (B): 365 mg/dL — ABNORMAL HIGH (ref 0–11)

## 2012-01-14 LAB — RAPID URINE DRUG SCREEN, HOSP PERFORMED: Barbiturates: NOT DETECTED

## 2012-01-14 NOTE — ED Notes (Signed)
ZOX:WRUE<AV> Expected date:<BR> Expected time:<BR> Means of arrival:<BR> Comments:<BR> Medic 212 SI

## 2012-01-14 NOTE — ED Notes (Signed)
Patient is alert and oriented x3.  He has been brought in by [facility] for suicidal ideations. Patient has a previous wound to the right foot with amputation of 2 toes.
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
## 2012-01-15 NOTE — ED Provider Notes (Signed)
12:59 AM The patient is walking up and down the hall without any difficulty.  His been able to avoid traffic in the hall.  He states his rate to go home and would like to walk home at this time.  No homicidal or suicidal thoughts.  The patient presented almost 5 hours ago with a blood alcohol greater than 300.  He still has alcohol and poor but clinically I think you in avoid any dangerous situation at this time given his 5 hours to somewhat sober in the emergency department.  The patient is instructed followup as an outpatient for alcohol abuse  Lyanne Co, MD 01/15/12 0100

## 2012-01-15 NOTE — ED Notes (Signed)
Patient is alert and oriented x3.  He was given DC instructions and follow up visit instructions.  Patient gave verbal understanding.  He was DC ambulatory under his own power to home.  V/S stable.  He was not showing any signs of distress on DC 

## 2012-01-17 NOTE — Progress Notes (Signed)
Patient ID: Mitchell [name], male   DOB: 1975/06/30, 36 y.o.   MRN: [id number] Subjective: The patient is a 36 y.o. year old male who presents today for f/u.  1. DM: Blood sugars running in the mid 100s generally.  No lows.  Highest is low 200s.  2. Alcohol: Patient currently abstinent, not interested in AA at this time.  Says he will not be drinking again.  3. Depression: Patient reports mood currently good.  No problems with SI/HI.  Sleep is good.  Girlfriend agrees that mood is fine.  4. Foot: Patient reports some mild swelling in his right foot but no pain or drainage.  No redness.  Has finished abx.  Patient's past medical, social, and family history were reviewed and updated as appropriate. History  Substance Use Topics  . Smoking status: Never Smoker   . Smokeless tobacco: Never Used  . Alcohol Use: No     Comment: 12/15/2011 "3 packages of 12 beers/wk", has been abstinant following hospital admission 12/2011   Objective:  Filed Vitals:   01/07/12 1015  BP: 121/75  Pulse: 73  Temp: 97.4 F (36.3 C)   Gen: NAD CV: RRR Resp: CTABL Ext: Right foot missing first digit.  Sutures still in place with some dried blood.  No drainage, no pain on palpation, no redness.  Assessment/Plan:  Please also see individual problems in problem list for problem-specific plans.

## 2012-01-21 ENCOUNTER — Ambulatory Visit (INDEPENDENT_AMBULATORY_CARE_PROVIDER_SITE_OTHER): Payer: No Typology Code available for payment source | Admitting: Family Medicine

## 2012-01-21 ENCOUNTER — Encounter: Payer: Self-pay | Admitting: Family Medicine

## 2012-01-21 VITALS — BP 150/91 | HR 73 | Temp 97.6°F | Ht 64.0 in | Wt 162.0 lb

## 2012-01-21 DIAGNOSIS — E118 Type 2 diabetes mellitus with unspecified complications: Secondary | ICD-10-CM

## 2012-01-21 DIAGNOSIS — F1994 Other psychoactive substance use, unspecified with psychoactive substance-induced mood disorder: Secondary | ICD-10-CM

## 2012-01-21 DIAGNOSIS — M25512 Pain in left shoulder: Secondary | ICD-10-CM | POA: Insufficient documentation

## 2012-01-21 DIAGNOSIS — M869 Osteomyelitis, unspecified: Secondary | ICD-10-CM

## 2012-01-21 DIAGNOSIS — M25519 Pain in unspecified shoulder: Secondary | ICD-10-CM

## 2012-01-21 MED ORDER — TRAMADOL HCL 50 MG PO TABS: 100.0000 mg | ORAL_TABLET | Freq: Three times a day (TID) | ORAL | Status: DC | PRN

## 2012-01-21 NOTE — Patient Instructions (Signed)
I want you to make an appointment to see me in 2 weeks. The only AA meetings around that are for Spanish speakers are in [facility].  The contact person for the meetings is Shari Prows and his number is [phone].  They have meetings at [employer] at [employer] in [place] Saturday evening at 8pm and Thursday evening at 7pm.  Elfredia Nevins que hagas una cita para verme en 2 semanas. Las nicas reuniones de AA que son para los hispano estn en [place]. La persona de contacto para las reuniones es Shari Prows y su nmero es [phone]. Tienen reuniones en [employer] en [address] en [place] sbado a las 8:00pm y los jueves a las 7:00pm.

## 2012-01-21 NOTE — Assessment & Plan Note (Signed)
No evidence of broken bones.  Currently just bruising.  WIll rx tramadol and stressed importance of not mixing with alcohol.

## 2012-01-21 NOTE — Assessment & Plan Note (Signed)
Healing well. I will see the patient back in 2 weeks to recheck. We'll see his orthopedist next week.

## 2012-01-21 NOTE — Assessment & Plan Note (Signed)
Unfortunately, the patient is reverted to his drinking. Or list of resources is relatively limited do to his insurance status. Alcoholics Anonymous is the best other option available. I've given him contact information for several Spanish-speaking groups.

## 2012-01-21 NOTE — Progress Notes (Signed)
Patient ID: Mitchell [name], male   DOB: March 19, 1975, 36 y.o.   MRN: [id number] Subjective: The patient is a 36 y.o. year old male who presents today for followup.  1. Foot wound: Stitches removed at last appointment with orthopedist. Has been intermittently bleeding a tiny bit. This is somewhat exacerbated by fall that he had several days ago. No fevers, chills, or purulent drainage. Has followup appointment with orthopedist next week.  2. Diabetes: Reports blood sugars are well controlled ranging in the 120s to 130s. No significant highs or lows.  3. Substance use: Patient reports that he isn't drinking again. This is partially responsible for his fall 3 days ago. He also said emergency department visit for being inebriated. He reports he is contracted [facility] [place] and is on a waiting list for their counseling. He is open to any help that can be provided.  Patient's past medical, social, and family history were reviewed and updated as appropriate. History  Substance Use Topics  . Smoking status: Never Smoker   . Smokeless tobacco: Never Used  . Alcohol Use: No     Comment: 12/15/2011 "3 packages of 12 beers/wk",   Objective:  Filed Vitals:   01/21/12 0907  BP: 150/91  Pulse: 73  Temp: 97.6 F (36.4 C)   Gen: No acute distress, mildly dysphoric affect Ext: Bruise on left shoulder without bony abnormality.  Is approximately 4cm diameter.  Tender to papation but full ROM.  Ray excision is healing well.  Still scab in place but no warmth, fluctuance, or expressible drainage.  Assessment/Plan:  Please also see individual problems in problem list for problem-specific plans.

## 2012-01-21 NOTE — Assessment & Plan Note (Signed)
His diabetes appears well controlled. No changes to medications at this time.

## 2012-01-22 ENCOUNTER — Encounter (HOSPITAL_COMMUNITY): Payer: Self-pay

## 2012-01-22 ENCOUNTER — Emergency Department (HOSPITAL_COMMUNITY)
Admission: EM | Admit: 2012-01-22 | Discharge: 2012-01-25 | Disposition: A | Discharge status: Home / Self Care | Payer: Self-pay | Attending: Emergency Medicine | Admitting: Emergency Medicine

## 2012-01-22 DIAGNOSIS — F329 Major depressive disorder, single episode, unspecified: Secondary | ICD-10-CM

## 2012-01-22 DIAGNOSIS — Z9889 Other specified postprocedural states: Secondary | ICD-10-CM | POA: Insufficient documentation

## 2012-01-22 DIAGNOSIS — Z8781 Personal history of (healed) traumatic fracture: Secondary | ICD-10-CM | POA: Insufficient documentation

## 2012-01-22 DIAGNOSIS — S98119A Complete traumatic amputation of unspecified great toe, initial encounter: Secondary | ICD-10-CM | POA: Insufficient documentation

## 2012-01-22 DIAGNOSIS — E119 Type 2 diabetes mellitus without complications: Secondary | ICD-10-CM | POA: Insufficient documentation

## 2012-01-22 DIAGNOSIS — K219 Gastro-esophageal reflux disease without esophagitis: Secondary | ICD-10-CM | POA: Insufficient documentation

## 2012-01-22 DIAGNOSIS — F3289 Other specified depressive episodes: Secondary | ICD-10-CM | POA: Insufficient documentation

## 2012-01-22 DIAGNOSIS — M869 Osteomyelitis, unspecified: Secondary | ICD-10-CM | POA: Insufficient documentation

## 2012-01-22 DIAGNOSIS — R45851 Suicidal ideations: Secondary | ICD-10-CM | POA: Insufficient documentation

## 2012-01-22 DIAGNOSIS — K7010 Alcoholic hepatitis without ascites: Secondary | ICD-10-CM | POA: Insufficient documentation

## 2012-01-22 DIAGNOSIS — Z79899 Other long term (current) drug therapy: Secondary | ICD-10-CM | POA: Insufficient documentation

## 2012-01-22 DIAGNOSIS — K2920 Alcoholic gastritis without bleeding: Secondary | ICD-10-CM | POA: Insufficient documentation

## 2012-01-22 DIAGNOSIS — F1010 Alcohol abuse, uncomplicated: Secondary | ICD-10-CM | POA: Insufficient documentation

## 2012-01-22 LAB — BASIC METABOLIC PANEL
BUN: 9 mg/dL (ref 6–23)
CO2: 19 mEq/L (ref 19–32)
Calcium: 8.6 mg/dL (ref 8.4–10.5)
Chloride: 102 mEq/L (ref 96–112)
Creatinine, Ser: 0.87 mg/dL (ref 0.50–1.35)
Glucose, Bld: 179 mg/dL — ABNORMAL HIGH (ref 70–99)

## 2012-01-22 LAB — RAPID URINE DRUG SCREEN, HOSP PERFORMED
Amphetamines: NOT DETECTED
Barbiturates: NOT DETECTED
Benzodiazepines: NOT DETECTED
Cocaine: NOT DETECTED
Tetrahydrocannabinol: NOT DETECTED

## 2012-01-22 LAB — COMPREHENSIVE METABOLIC PANEL
ALT: 33 U/L (ref 0–53)
AST: 46 U/L — ABNORMAL HIGH (ref 0–37)
Alkaline Phosphatase: 83 U/L (ref 39–117)
CO2: 21 mEq/L (ref 19–32)
GFR calc Af Amer: >90 mL/min (ref >90)
Glucose, Bld: 155 mg/dL — ABNORMAL HIGH (ref 70–99)
Potassium: 3.4 mEq/L — ABNORMAL LOW (ref 3.5–5.1)
Sodium: 141 mEq/L (ref 135–145)
Total Protein: 7.6 g/dL (ref 6.0–8.3)

## 2012-01-22 LAB — CBC
HCT: 30 % — ABNORMAL LOW (ref 39.0–52.0)
MCH: 32 pg (ref 26.0–34.0)
MCV: 87.2 fL (ref 78.0–100.0)
RBC: 3.44 MIL/uL — ABNORMAL LOW (ref 4.22–5.81)
WBC: 5.5 10*3/uL (ref 4.0–10.5)

## 2012-01-22 LAB — URINALYSIS, ROUTINE W REFLEX MICROSCOPIC
Bilirubin Urine: NEGATIVE
Glucose, UA: NEGATIVE mg/dL
Ketones, ur: NEGATIVE mg/dL
Nitrite: NEGATIVE
Specific Gravity, Urine: 1.006 (ref 1.005–1.030)
pH: 6 (ref 5.0–8.0)

## 2012-01-22 MED ORDER — POTASSIUM CHLORIDE CRYS ER 20 MEQ PO TBCR
40.0000 meq | EXTENDED_RELEASE_TABLET | Freq: Once | ORAL | Status: AC
  Administered 2012-01-22: 40 meq via ORAL
  Filled 2012-01-22 (×2): qty 2

## 2012-01-22 MED ORDER — LORAZEPAM 1 MG PO TABS: 0.0000 mg | ORAL_TABLET | Freq: Two times a day (BID) | ORAL | Status: DC

## 2012-01-22 MED ORDER — ADULT MULTIVITAMIN W/MINERALS CH
1.0000 | ORAL_TABLET | Freq: Every day | ORAL | Status: DC
  Administered 2012-01-22 – 2012-01-25 (×4): 1 via ORAL
  Filled 2012-01-22 (×4): qty 1

## 2012-01-22 MED ORDER — POTASSIUM CHLORIDE CRYS ER 20 MEQ PO TBCR
40.0000 meq | EXTENDED_RELEASE_TABLET | Freq: Once | ORAL | Status: AC
  Administered 2012-01-22: 40 meq via ORAL
  Filled 2012-01-22: qty 2

## 2012-01-22 MED ORDER — ZIPRASIDONE MESYLATE 20 MG IM SOLR
20.0000 mg | Freq: Once | INTRAMUSCULAR | Status: AC
  Administered 2012-01-22: 20 mg via INTRAMUSCULAR
  Filled 2012-01-22: qty 20

## 2012-01-22 MED ORDER — LORAZEPAM 1 MG PO TABS
0.0000 mg | ORAL_TABLET | Freq: Four times a day (QID) | ORAL | Status: AC
  Administered 2012-01-22 – 2012-01-23 (×4): 1 mg via ORAL
  Filled 2012-01-22 (×4): qty 1

## 2012-01-22 MED ORDER — THIAMINE HCL 100 MG/ML IJ SOLN: 100.0000 mg | Freq: Every day | INTRAMUSCULAR | Status: DC

## 2012-01-22 MED ORDER — VITAMIN B-1 100 MG PO TABS
100.0000 mg | ORAL_TABLET | Freq: Every day | ORAL | Status: DC
  Administered 2012-01-22 – 2012-01-25 (×4): 100 mg via ORAL
  Filled 2012-01-22 (×4): qty 1

## 2012-01-22 MED ORDER — FOLIC ACID 1 MG PO TABS
1.0000 mg | ORAL_TABLET | Freq: Every day | ORAL | Status: DC
  Administered 2012-01-22 – 2012-01-25 (×4): 1 mg via ORAL
  Filled 2012-01-22 (×4): qty 1

## 2012-01-22 MED ORDER — MIRTAZAPINE 30 MG PO TABS
15.0000 mg | ORAL_TABLET | Freq: Every day | ORAL | Status: DC
  Administered 2012-01-22 – 2012-01-24 (×3): 15 mg via ORAL
  Filled 2012-01-22 (×3): qty 1

## 2012-01-22 NOTE — ED Notes (Signed)
Sitter at bedside.
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
## 2012-01-22 NOTE — ED Provider Notes (Addendum)
Pt resting, nad, vitals normal. Act eval/telepsych pending.  telepsych rec inpt psych and starting remeron 15 qhs.   Suzi Roots, MD 01/22/12 1610  Suzi Roots, MD 01/22/12 1024

## 2012-01-22 NOTE — ED Notes (Signed)
Sitter reports that pt balled up fist, was punching air and kicking bed rails. Pt stated that the voices are telling him to hurt himself. Pt calm and cooperative at this time

## 2012-01-22 NOTE — ED Notes (Signed)
Informed by sitter that the pt was putting his head through the rails and tying the cover his neck and trying to suffocate himself with pillow. Pt currently sitting in chair quietly and calmly.  No harmful behaviors noted.

## 2012-01-22 NOTE — ED Notes (Signed)
Telepsych faxed.

## 2012-01-22 NOTE — ED Notes (Signed)
[employer] removed cuffs and has left.
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
## 2012-01-22 NOTE — ED Notes (Signed)
Per [employer], called to pt home by family.  Pt threatening to cut wrist with knife.  Pt has been drinking.  Taken into custody. Family taking out IVC papers.

## 2012-01-22 NOTE — ED Notes (Signed)
Dr Baralt(SPC) called and requested liver enzymes for pt. Also requested that pt be medically cleared and admitted and place pt on CIWA protocol

## 2012-01-23 LAB — GLUCOSE, CAPILLARY

## 2012-01-23 MED ORDER — IBUPROFEN 800 MG PO TABS
800.0000 mg | ORAL_TABLET | Freq: Once | ORAL | Status: AC
  Administered 2012-01-23: 800 mg via ORAL
  Filled 2012-01-23: qty 1

## 2012-01-23 NOTE — ED Notes (Signed)
Patients request foot dsg be changed on rt foot- recently had great toe amputated and noted that sole of foot appeared necrotic in area and indented in the mid- sole of foot. Dark on anterior and posterior. Patient tolerated dry dsg change well. No complaints. Noted small at bloody discharge from the inner lateral portion of the foot and note large scabbed dried area to the ball of foot

## 2012-01-23 NOTE — ED Notes (Signed)
Patient had family at bedside and patient complaint about food choices and wanting outside food. Made patient and family aware of hospital policy and outside food. Patient was not pleased but became receptive later and took meal and drink form nutrition room.

## 2012-01-23 NOTE — ED Provider Notes (Signed)
Pt sleeping in room, no concerns overnight, awaiting placement. Telepsych has recommended inpatient, he has IVC  Charles B. Bernette Mayers, MD 01/23/12 [phone]

## 2012-01-23 NOTE — ED Notes (Signed)
Attempted to wake pt up to do a CIWA assessment.  Pt still asleep.  No sweating or acute distress noted.  Sitter at bedside.

## 2012-01-24 ENCOUNTER — Emergency Department (HOSPITAL_COMMUNITY): Payer: Self-pay

## 2012-01-24 LAB — GLUCOSE, CAPILLARY
Glucose-Capillary: 138 mg/dL — ABNORMAL HIGH (ref 70–99)
Glucose-Capillary: 159 mg/dL — ABNORMAL HIGH (ref 70–99)

## 2012-01-24 NOTE — Clinical Social Work Note (Signed)
CSW reviewed pt chart to read "Patients request foot dsg be changed on rt foot- recently had great toe amputated and noted that sole of foot appeared necrotic in area and indented in the mid- sole of foot. Dark on anterior and posterior. Patient tolerated dry dsg change well. No complaints. Noted small at bloody discharge from the inner lateral portion of the foot and note large scabbed dried area to the ball of foot" by RN PamelaGillis Hamilton on 01/23/2012 9:46PM.  CSW did not see where this issue had been addressed or reviewed by EDP.  CSW consulted EDP, Silverio Lay who stated that he would order a right foot x-ray and assess.    Vickii Penna, LCSWA [phone]  Clinical Social Work
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
## 2012-01-24 NOTE — ED Notes (Signed)
MD at bedside. 

## 2012-01-25 LAB — GLUCOSE, CAPILLARY

## 2012-01-25 NOTE — ED Notes (Signed)
Belongings placed in locker #29. 

## 2012-01-25 NOTE — BHH Counselor (Signed)
Per shift report, patient is pending a 400 hall bed at [facility]. Patient initially met with patient approx. 3 days ago and at the time patient voiced suicidal thoughts with plan. He was also experiencing AVH's (command). Writer met with patient this am to discuss his current psychiatric and/or substance abuse related complaints. Writer first addressed the language barrier. Patient's primary language is [place]. Writer offered patient interpreter services for more effective communication. Patient stated, "I am ok " and "I understand a little English". Writer continued with the conversation. Today Clinical research associate asked patient if he is SI, HI, or experiencing any AVH's. Patient had no other complaints. Writer has initiated a telepsych for a potential discharge home today. Patients plan of care was discussed with patients nurse-Nikki and EDP-Dr. Fonnie Jarvis.

## 2012-01-25 NOTE — Clinical Social Work Note (Signed)
CSW reviewed chart to f/u on status of right foot.  CSW placed a call to unit RN who stated that pt had an x-ray that stated no findings, but also stated may need MRI to rule out osteomyelitis.  CSW thanked Charity fundraiser for her assistance in reviewing the pt chart.  CSW will continue to follow.  Vickii Penna, LCSWA [phone]  Clinical Social Work

## 2012-01-25 NOTE — ED Notes (Signed)
Pt began TelePsych

## 2012-01-25 NOTE — BH Assessment (Signed)
BHH Assessment Progress Note  Patient completed a telepsych consult at this time and per tele psychiatrist patient is psychiatrically stable. The psychiatrist noted that patient does not appear to be a danger to self or others. Patient to be discharged with community mental health and alcohol referrals.   Writer discussed the telepsych recommendations with Dr. Radford Pax and he agreed to discharge patient. Writer provided patient with the appropriate referrals. Patient to be discharged home and follow up with referrals as needed. Patient was given referrals to [facility], support groups ([facility]), [facility], [facility], etc.

## 2012-01-25 NOTE — ED Notes (Signed)
Pt is A&O x4. He is calm and cooperative. Follows commands appropriately. Pt has spoken with wife and aware of discharge. He plans to ride the bus after discharge.

## 2012-01-27 ENCOUNTER — Telehealth (HOSPITAL_COMMUNITY): Payer: Self-pay | Admitting: Licensed Clinical Social Worker

## 2012-01-29 ENCOUNTER — Telehealth (HOSPITAL_COMMUNITY): Payer: Self-pay | Admitting: Licensed Clinical Social Worker

## 2012-02-04 ENCOUNTER — Ambulatory Visit: Payer: Self-pay | Admitting: Family Medicine

## 2012-03-03 ENCOUNTER — Ambulatory Visit: Payer: Self-pay | Admitting: Family Medicine

## 2012-04-25 ENCOUNTER — Encounter (HOSPITAL_COMMUNITY): Payer: Self-pay | Admitting: *Deleted

## 2012-04-25 ENCOUNTER — Inpatient Hospital Stay (HOSPITAL_COMMUNITY)
Admission: EM | Admit: 2012-04-25 | Discharge: 2012-04-28 | DRG: 918 | Disposition: A | Discharge status: Other Acute Inpatient Hospital | Payer: Medicaid Other | Attending: Internal Medicine | Admitting: Internal Medicine

## 2012-04-25 DIAGNOSIS — K219 Gastro-esophageal reflux disease without esophagitis: Secondary | ICD-10-CM

## 2012-04-25 DIAGNOSIS — F1994 Other psychoactive substance use, unspecified with psychoactive substance-induced mood disorder: Secondary | ICD-10-CM

## 2012-04-25 DIAGNOSIS — H538 Other visual disturbances: Secondary | ICD-10-CM | POA: Diagnosis present

## 2012-04-25 DIAGNOSIS — R7402 Elevation of levels of lactic acid dehydrogenase (LDH): Secondary | ICD-10-CM | POA: Diagnosis present

## 2012-04-25 DIAGNOSIS — F489 Nonpsychotic mental disorder, unspecified: Secondary | ICD-10-CM | POA: Diagnosis present

## 2012-04-25 DIAGNOSIS — T50992A Poisoning by other drugs, medicaments and biological substances, intentional self-harm, initial encounter: Secondary | ICD-10-CM | POA: Diagnosis present

## 2012-04-25 DIAGNOSIS — R7401 Elevation of levels of liver transaminase levels: Secondary | ICD-10-CM | POA: Diagnosis present

## 2012-04-25 DIAGNOSIS — R45851 Suicidal ideations: Secondary | ICD-10-CM

## 2012-04-25 DIAGNOSIS — F1010 Alcohol abuse, uncomplicated: Secondary | ICD-10-CM | POA: Diagnosis present

## 2012-04-25 DIAGNOSIS — F32A Depression, unspecified: Secondary | ICD-10-CM

## 2012-04-25 DIAGNOSIS — T43294A Poisoning by other antidepressants, undetermined, initial encounter: Principal | ICD-10-CM | POA: Diagnosis present

## 2012-04-25 DIAGNOSIS — M869 Osteomyelitis, unspecified: Secondary | ICD-10-CM

## 2012-04-25 DIAGNOSIS — F329 Major depressive disorder, single episode, unspecified: Secondary | ICD-10-CM | POA: Diagnosis present

## 2012-04-25 DIAGNOSIS — F3289 Other specified depressive episodes: Secondary | ICD-10-CM | POA: Diagnosis present

## 2012-04-25 DIAGNOSIS — Z79899 Other long term (current) drug therapy: Secondary | ICD-10-CM

## 2012-04-25 DIAGNOSIS — T471X1A Poisoning by other antacids and anti-gastric-secretion drugs, accidental (unintentional), initial encounter: Secondary | ICD-10-CM | POA: Diagnosis present

## 2012-04-25 DIAGNOSIS — R000 Tachycardia, unspecified: Secondary | ICD-10-CM | POA: Diagnosis present

## 2012-04-25 DIAGNOSIS — D696 Thrombocytopenia, unspecified: Secondary | ICD-10-CM | POA: Diagnosis present

## 2012-04-25 DIAGNOSIS — IMO0001 Reserved for inherently not codable concepts without codable children: Secondary | ICD-10-CM | POA: Diagnosis present

## 2012-04-25 DIAGNOSIS — T383X1A Poisoning by insulin and oral hypoglycemic [antidiabetic] drugs, accidental (unintentional), initial encounter: Secondary | ICD-10-CM | POA: Diagnosis present

## 2012-04-25 DIAGNOSIS — F1020 Alcohol dependence, uncomplicated: Secondary | ICD-10-CM

## 2012-04-25 DIAGNOSIS — T50902A Poisoning by unspecified drugs, medicaments and biological substances, intentional self-harm, initial encounter: Secondary | ICD-10-CM

## 2012-04-25 DIAGNOSIS — E118 Type 2 diabetes mellitus with unspecified complications: Secondary | ICD-10-CM

## 2012-04-25 DIAGNOSIS — M25512 Pain in left shoulder: Secondary | ICD-10-CM

## 2012-04-25 DIAGNOSIS — E876 Hypokalemia: Secondary | ICD-10-CM | POA: Diagnosis present

## 2012-04-25 DIAGNOSIS — F10129 Alcohol abuse with intoxication, unspecified: Secondary | ICD-10-CM

## 2012-04-25 DIAGNOSIS — T43502A Poisoning by unspecified antipsychotics and neuroleptics, intentional self-harm, initial encounter: Secondary | ICD-10-CM | POA: Diagnosis present

## 2012-04-25 DIAGNOSIS — T50901A Poisoning by unspecified drugs, medicaments and biological substances, accidental (unintentional), initial encounter: Secondary | ICD-10-CM

## 2012-04-25 LAB — CBC WITH DIFFERENTIAL/PLATELET
Eosinophils Absolute: 0.1 10*3/uL (ref 0.0–0.7)
Eosinophils Relative: 1 % (ref 0–5)
MCV: 86.2 fL (ref 78.0–100.0)
Metamyelocytes Relative: 0 %
Monocytes Absolute: 0.3 10*3/uL (ref 0.1–1.0)
Monocytes Relative: 4 % (ref 3–12)
Neutro Abs: 3.7 10*3/uL (ref 1.7–7.7)
Platelets: 142 10*3/uL — ABNORMAL LOW (ref 150–400)
RBC: 4.63 MIL/uL (ref 4.22–5.81)
WBC: 6.7 10*3/uL (ref 4.0–10.5)
nRBC: 0 /100 WBC

## 2012-04-25 LAB — URINALYSIS, ROUTINE W REFLEX MICROSCOPIC
Glucose, UA: NEGATIVE mg/dL
Leukocytes, UA: NEGATIVE
Specific Gravity, Urine: 1.01 (ref 1.005–1.030)

## 2012-04-25 LAB — RAPID URINE DRUG SCREEN, HOSP PERFORMED
Benzodiazepines: NOT DETECTED
Cocaine: NOT DETECTED
Opiates: NOT DETECTED

## 2012-04-25 LAB — URINE MICROSCOPIC-ADD ON

## 2012-04-25 LAB — COMPREHENSIVE METABOLIC PANEL
BUN: 8 mg/dL (ref 6–23)
CO2: 23 mEq/L (ref 19–32)
Calcium: 8.7 mg/dL (ref 8.4–10.5)
Creatinine, Ser: 0.82 mg/dL (ref 0.50–1.35)
GFR calc Af Amer: >90 mL/min (ref >90)
GFR calc non Af Amer: >90 mL/min (ref >90)
Glucose, Bld: 164 mg/dL — ABNORMAL HIGH (ref 70–99)

## 2012-04-25 MED ORDER — THIAMINE HCL 100 MG/ML IJ SOLN
100.0000 mg | Freq: Every day | INTRAMUSCULAR | Status: DC
  Filled 2012-04-25 (×3): qty 1

## 2012-04-25 MED ORDER — PANTOPRAZOLE SODIUM 40 MG PO TBEC
40.0000 mg | DELAYED_RELEASE_TABLET | Freq: Every day | ORAL | Status: DC
  Administered 2012-04-25 – 2012-04-28 (×4): 40 mg via ORAL
  Filled 2012-04-25 (×4): qty 1

## 2012-04-25 MED ORDER — ONDANSETRON HCL 4 MG/2ML IJ SOLN
4.0000 mg | Freq: Four times a day (QID) | INTRAMUSCULAR | Status: DC | PRN
  Administered 2012-04-26: 4 mg via INTRAVENOUS
  Filled 2012-04-25: qty 2

## 2012-04-25 MED ORDER — LORAZEPAM 1 MG PO TABS
1.0000 mg | ORAL_TABLET | Freq: Four times a day (QID) | ORAL | Status: DC | PRN
  Administered 2012-04-27 (×2): 1 mg via ORAL
  Filled 2012-04-25 (×2): qty 1

## 2012-04-25 MED ORDER — PNEUMOCOCCAL VAC POLYVALENT 25 MCG/0.5ML IJ INJ
0.5000 mL | INJECTION | INTRAMUSCULAR | Status: AC
  Filled 2012-04-25 (×2): qty 0.5

## 2012-04-25 MED ORDER — LORAZEPAM 2 MG/ML IJ SOLN
1.0000 mg | Freq: Once | INTRAMUSCULAR | Status: AC
  Administered 2012-04-25: 1 mg via INTRAVENOUS
  Filled 2012-04-25: qty 1

## 2012-04-25 MED ORDER — VITAMIN B-1 100 MG PO TABS
100.0000 mg | ORAL_TABLET | Freq: Every day | ORAL | Status: DC
  Administered 2012-04-25 – 2012-04-28 (×4): 100 mg via ORAL
  Filled 2012-04-25 (×4): qty 1

## 2012-04-25 MED ORDER — ONDANSETRON HCL 4 MG PO TABS: 4.0000 mg | ORAL_TABLET | Freq: Four times a day (QID) | ORAL | Status: DC | PRN

## 2012-04-25 MED ORDER — LORAZEPAM 2 MG/ML IJ SOLN
1.0000 mg | Freq: Four times a day (QID) | INTRAMUSCULAR | Status: DC | PRN
  Administered 2012-04-26 – 2012-04-27 (×4): 1 mg via INTRAVENOUS
  Filled 2012-04-25 (×4): qty 1

## 2012-04-25 MED ORDER — ONDANSETRON HCL 4 MG/2ML IJ SOLN: 4.0000 mg | Freq: Four times a day (QID) | INTRAMUSCULAR | Status: DC | PRN

## 2012-04-25 MED ORDER — INSULIN ASPART 100 UNIT/ML ~~LOC~~ SOLN
0.0000 [IU] | Freq: Three times a day (TID) | SUBCUTANEOUS | Status: DC
  Administered 2012-04-26: 2 [IU] via SUBCUTANEOUS
  Administered 2012-04-26: 3 [IU] via SUBCUTANEOUS
  Administered 2012-04-27: 8 [IU] via SUBCUTANEOUS
  Administered 2012-04-27: 2 [IU] via SUBCUTANEOUS
  Administered 2012-04-28 (×2): 3 [IU] via SUBCUTANEOUS

## 2012-04-25 MED ORDER — ADULT MULTIVITAMIN W/MINERALS CH: 1.0000 | ORAL_TABLET | Freq: Every day | ORAL | Status: DC

## 2012-04-25 MED ORDER — ALUM & MAG HYDROXIDE-SIMETH 200-200-20 MG/5ML PO SUSP: 30.0000 mL | Freq: Four times a day (QID) | ORAL | Status: DC | PRN

## 2012-04-25 MED ORDER — FOLIC ACID 1 MG PO TABS
1.0000 mg | ORAL_TABLET | Freq: Every day | ORAL | Status: DC
  Administered 2012-04-25 – 2012-04-28 (×4): 1 mg via ORAL
  Filled 2012-04-25 (×4): qty 1

## 2012-04-25 MED ORDER — SODIUM CHLORIDE 0.9 % IV SOLN
INTRAVENOUS | Status: AC
  Administered 2012-04-25: 21:00:00 via INTRAVENOUS

## 2012-04-25 MED ORDER — ADULT MULTIVITAMIN W/MINERALS CH
1.0000 | ORAL_TABLET | Freq: Every day | ORAL | Status: DC
  Administered 2012-04-25 – 2012-04-28 (×4): 1 via ORAL
  Filled 2012-04-25 (×4): qty 1

## 2012-04-25 MED ORDER — ONDANSETRON HCL 4 MG PO TABS: 4.0000 mg | ORAL_TABLET | ORAL | Status: DC | PRN

## 2012-04-25 MED ORDER — THIAMINE HCL 100 MG/ML IJ SOLN
Freq: Once | INTRAVENOUS | Status: AC
  Administered 2012-04-25: 22:00:00 via INTRAVENOUS
  Filled 2012-04-25: qty 1000

## 2012-04-25 MED ORDER — SODIUM CHLORIDE 0.9 % IJ SOLN
3.0000 mL | Freq: Two times a day (BID) | INTRAMUSCULAR | Status: DC
  Administered 2012-04-27: 3 mL via INTRAVENOUS

## 2012-04-25 MED ORDER — SODIUM CHLORIDE 0.9 % IJ SOLN: 3.0000 mL | Freq: Two times a day (BID) | INTRAMUSCULAR | Status: DC

## 2012-04-25 MED ORDER — SODIUM CHLORIDE 0.9 % IV SOLN
INTRAVENOUS | Status: DC
  Administered 2012-04-26 (×2): 100 mL/h via INTRAVENOUS

## 2012-04-25 MED ORDER — ENOXAPARIN SODIUM 40 MG/0.4ML ~~LOC~~ SOLN
40.0000 mg | SUBCUTANEOUS | Status: DC
  Administered 2012-04-25: 40 mg via SUBCUTANEOUS
  Filled 2012-04-25 (×2): qty 0.4

## 2012-04-25 MED ORDER — POTASSIUM CHLORIDE CRYS ER 20 MEQ PO TBCR
40.0000 meq | EXTENDED_RELEASE_TABLET | Freq: Once | ORAL | Status: AC
  Administered 2012-04-25: 40 meq via ORAL
  Filled 2012-04-25: qty 2
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
## 2012-04-25 NOTE — ED Notes (Signed)
Attempted to call report to ICE, RN in room to call ED right back.

## 2012-04-25 NOTE — ED Notes (Signed)
Per Charge RN pt will stay in the department until the change of shift, since there is no available sitters.

## 2012-04-25 NOTE — ED Notes (Signed)
Attempted to call report, ICU unable to take report at this time.  

## 2012-04-25 NOTE — ED Notes (Signed)
Sitter at bedside, pt still very tearful, refuses to answer any questions.
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
## 2012-04-25 NOTE — ED Notes (Signed)
Pt sts he wants to die and tried to kill himself today by overdosing on different medications d/t family problems. Pt crying during assessment. Strong ETOH odor. Pt is tachycardic; placed on monitor, EKG done.

## 2012-04-25 NOTE — ED Notes (Signed)
MD at bedside. Hospitalist at bedside. Spanish interpreter used on phone to assist with MD consult.

## 2012-04-25 NOTE — ED Notes (Signed)
YNW:[id number]> Expected date:<BR> Expected time:<BR> Means of arrival:<BR> Comments:<BR> overdose
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
## 2012-04-26 DIAGNOSIS — T50992A Poisoning by other drugs, medicaments and biological substances, intentional self-harm, initial encounter: Secondary | ICD-10-CM

## 2012-04-26 DIAGNOSIS — F1010 Alcohol abuse, uncomplicated: Secondary | ICD-10-CM

## 2012-04-26 LAB — HEMOGLOBIN A1C: Hgb A1c MFr Bld: 7.5 % — ABNORMAL HIGH (ref <5.7)

## 2012-04-26 LAB — COMPREHENSIVE METABOLIC PANEL
ALT: 50 U/L (ref 0–53)
AST: 53 U/L — ABNORMAL HIGH (ref 0–37)
Calcium: 8 mg/dL — ABNORMAL LOW (ref 8.4–10.5)
Creatinine, Ser: 0.67 mg/dL (ref 0.50–1.35)
GFR calc Af Amer: >90 mL/min (ref >90)
GFR calc non Af Amer: >90 mL/min (ref >90)
Sodium: 135 mEq/L (ref 135–145)
Total Protein: 6.4 g/dL (ref 6.0–8.3)

## 2012-04-26 LAB — MRSA PCR SCREENING: MRSA by PCR: NEGATIVE

## 2012-04-26 LAB — GLUCOSE, CAPILLARY
Glucose-Capillary: 171 mg/dL — ABNORMAL HIGH (ref 70–99)
Glucose-Capillary: 148 mg/dL — ABNORMAL HIGH (ref 70–99)
Glucose-Capillary: 212 mg/dL — ABNORMAL HIGH (ref 70–99)

## 2012-04-26 LAB — CBC
MCH: 32.1 pg (ref 26.0–34.0)
MCHC: 37.1 g/dL — ABNORMAL HIGH (ref 30.0–36.0)
MCV: 86.6 fL (ref 78.0–100.0)
Platelets: 77 10*3/uL — ABNORMAL LOW (ref 150–400)
RDW: 13.4 % (ref 11.5–15.5)

## 2012-04-26 MED ORDER — INFLUENZA VIRUS VACC SPLIT PF IM SUSP
0.5000 mL | INTRAMUSCULAR | Status: AC
  Filled 2012-04-26 (×2): qty 0.5

## 2012-04-26 MED ORDER — POTASSIUM CHLORIDE CRYS ER 20 MEQ PO TBCR
40.0000 meq | EXTENDED_RELEASE_TABLET | Freq: Once | ORAL | Status: AC
  Administered 2012-04-26: 40 meq via ORAL
  Filled 2012-04-26: qty 2
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
## 2012-04-26 NOTE — Progress Notes (Signed)
Clinical Social Work Department CLINICAL SOCIAL WORK PSYCHIATRY SERVICE LINE ASSESSMENT 04/26/2012  Patient:  Mitchell [name]  Account:  [id number]  Admit Date:  04/25/2012  Clinical Social Worker:  Unk Lightning, LCSW  Date/Time:  04/26/2012 12:00 N Referred by:  Physician  Date referred:  04/26/2012 Reason for Referral  Behavioral Health Issues   Presenting Symptoms/Problems (In the person's/family's own words):   Psych consulted due to overdose and threatening to hurt himself with a knife.   Abuse/Neglect/Trauma History (check all that apply)  Denies history   Abuse/Neglect/Trauma Comments:   Psychiatric History (check all that apply)  Outpatient treatment   Psychiatric medications:  Celexa 40 mg  Risperdal 0.5mg   Trazodone 50 mg   Current Mental Health Hospitalizations/Previous Mental Health History:   Patient reports that he has been diagnosed with depression. Patient reports that he has been to [facility] but no other treatment for depression.   Current provider:   Urgent Care has prescribed medications   Place and Date:   [facility]. [place], [place]   Current Medications:   alum & mag hydroxide-simeth, LORazepam, LORazepam, ondansetron (ZOFRAN) IV, ondansetron            . folic acid  1 mg Oral Daily  . [START ON 04/27/2012] influenza  inactive virus vaccine  0.5 mL Intramuscular Tomorrow-1000  . insulin aspart  0-15 Units Subcutaneous TID WC  . multivitamin with minerals  1 tablet Oral Daily  . pantoprazole  40 mg Oral Daily  . pneumococcal 23 valent vaccine  0.5 mL Intramuscular Tomorrow-1000  . potassium chloride  40 mEq Oral Once  . sodium chloride  3 mL Intravenous Q12H  . sodium chloride  3 mL Intravenous Q12H  . thiamine  100 mg Oral Daily   Or     . thiamine  100 mg Intravenous Daily   Previous Impatient Admission/Date/Reason:   Patient reports he went to [facility] last year due to overdose.   Emotional Health / Current Symptoms    Suicide/Self Harm   Suicidal ideation (ex: "I can't take any more,I wish I could disappear")  Suicide attempt in past (date/description)   Suicide attempt in the past:   Patient admitted for overdose and threatening to hurt himself with a knife. Patient reports he feels hopeless and depressed. Patient is unable to contract for safety at this moment and is interested in inpatient treatment.   Other harmful behavior:   None reported   Psychotic/Dissociative Symptoms  None reported   Other Psychotic/Dissociative Symptoms:    Attention/Behavioral Symptoms  Within Normal Limits   Other Attention / Behavioral Symptoms:    Cognitive Impairment  Within Normal Limits   Other Cognitive Impairment:    Mood and Adjustment  DEPRESSION    Stress, Anxiety, Trauma, Any Recent Loss/Stressor  Other - See comment   Anxiety (frequency):   Phobia (specify):   Compulsive behavior (specify):   Obsessive behavior (specify):   Other:   Patient is unemployed. Strained relationship with wife.   Substance Abuse/Use  Current substance use   SBIRT completed (please refer for detailed history):  Y  Self-reported substance use:   Patient reports he drinks a couple of days out of the week. Patient reports he usually drinks 3 40z beers when he decides to drink. Patient denies any other substance use.   Urinary Drug Screen Completed:  Y Alcohol level:   332    Environmental/Housing/Living Arrangement  With Other Relatives   Who is in the home:  Patient reports he has been staying with family. Patient is unsure about who he will live with when he dc from the hospital.   Emergency contact:  Beatriz-sister   Financial  IPRS   Patient's Strengths and Goals (patient's own words):   Patient reports family is supportive.   Clinical Social Worker's Interpretive Summary:   CSW received referral to complete psychosocial assessment. CSW reviewed chart and met with patient at bedside. Interpreter present during  assessment. CSW introduced myself and explained role.    Patient reports he was admitted to the hospital after feeling weak and taking a knife and trying to kill himself. Patient reports that he had been drinking that day and took some pills but could not remember what he took or how much he consumed. Patient reports that he is supposed to take 8-9 antidepressant pills because that is what is prescribed. This Clinical research associate feels patient has misunderstood instructions on how to take antidepressants but patient admits to taking too many pills for other medications.    Patient reports strained relationship with wife. Patient has two small children but has been arguing with wife due to being unable to work. This Clinical research associate is familiar with patient from previous admission at [facility]. At that hospital stay, patient had part of his foot amputated and has been unable to work since that time. Patient reports that financial concerns have caused stress on relationship. Patient also reports depression has increased over the past two months. Patient reports he struggles with sleeping, has no appetite, becomes irritable and has SI. Patient reports he did not follow up with resources provided at [facility] for medication management and therapy but did go to some AA meetings.    Patient reports that he is unsure about dc plans and where he will stay. Patient is unable to fully contract for safety at this time and is agreeable to treatment. Patient feels [facility] stay was helpful in the past. CSW reminded patient about the importance of outpatient follow up and has provided patient with an agency that has Spanish-speaking counselors.    Patient reports he drinks alcohol a couple of times a week but consumes large amounts. Patient denies any further drug use. Patient acknowledges connection between depression and alcohol use and realizes that he feels more depressed after drinking alcohol. CSW and patient spoke about importance of  learning positive coping skills so that he does not rely on alcohol.    Patient participated in assessment with appropriate eye contact. Patient has flat affect and appears to minimize substance use. Patient has multiple stressors in home life but could benefit from treatment. Patient open to treatment and reports he will follow up on outpatient basis once stable.    CSW will staff case with psych MD and follow recommendations provided.   Disposition:  Recommend Psych CSW continuing to support while in hospital

## 2012-04-26 NOTE — Progress Notes (Signed)
Clinical Social Work  CSW met with patient who requested interpreter be present. CSW scheduled interpreter for 1200.  [place], [place] [phone]

## 2012-04-26 NOTE — Progress Notes (Signed)
CARE MANAGEMENT NOTE 04/26/2012  Patient:  Mitchell [name], Mitchell [name]   Account Number:  [id number]  Date Initiated:  04/26/2012  Documentation initiated by:  DAVIS,RHONDA  Subjective/Objective Assessment:   OVERDOSE     Action/Plan:   history of etoh abuse and coccaine abuse.   Anticipated DC Date:  04/29/2012   Anticipated DC Plan:  HOME/SELF CARE  In-house referral  Clinical Social Worker      DC Planning Services  NA      [facility] Choice  NA   Choice offered to / List presented to:  NA   DME arranged  NA      DME agency  NA     HH arranged  NA      HH agency  NA   Status of service:  In process, will continue to follow Medicare Important Message given?  NA - LOS <3 / Initial given by admissions (If response is "NO", the following Medicare IM given date fields will be blank) Date Medicare IM given:   Date Additional Medicare IM given:    Discharge Disposition:    Per UR Regulation:  Reviewed for med. necessity/level of care/duration of stay  If discussed at Long Length of Stay Meetings, dates discussed:    Comments:  [id number] Earlene Plater, RN, BSN, CCM:  CHART REVIEWED AND UPDATED.  Next chart review due on [id number]. NO DISCHARGE NEEDS PRESENT AT THIS TIME. CASE MANAGEMENT [phone]

## 2012-04-26 NOTE — Progress Notes (Signed)
Pt has health history that suggest the flu & pneumococcal vaccines are indicated. RN asked pt if he would like to receive either, and he refused that this time. Likely could benefit from both, should be reassessed at a later time.

## 2012-04-26 NOTE — Progress Notes (Signed)
Patient ID: Mitchell [name], male   DOB: 12-27-1975, 37 y.o.   MRN: [id number]  [facility] PROGRESS NOTE  Mitchell [name] NWG:[id number] DOB: [birth year] DOA: 04/25/2012 PCP: [name], MD  Brief narrative: Pt is 37 yo male who came to [facility] after intentional overdose on multiple home medications, including metformin, pepcid, etc. Pt also abuses alcohol. He was unable to provide history on admission but there was reported suicidal and homicidal attempt.  Principal Problem:   Drug overdose, intentional - clinically improving - continue sitter at bedside - psych consult pending  Active Problems:   Thrombocytopenia - unclear etiology of sudden drop - will stop heparin products and provide SCD's   Hypokalemia - supplement orally - BMP in AM   Transaminitis  - in the pattern of alcohol abuse, LFT's trending down  - CIWA protocol   GERD (gastroesophageal reflux disease) - continue Protonix    Diabetes mellitus type 2 with complications - will check A1C, SSI for now  Consultants:  Psych  Procedures/Studies:  None  Antibiotics:  None  Code Status: Full Family Communication: Pt at bedside Disposition Plan: Possible transfer to telemetry floor today  HPI/Subjective: No events overnight.   Objective: Filed Vitals:   04/26/12 0600 04/26/12 0800 04/26/12 1130 04/26/12 1150  BP:  123/74  125/97  Pulse: 101  83 96  Temp:  99 F (37.2 C)  98.5 F (36.9 C)  TempSrc:  Oral  Oral  Resp: 14 18  17   Height:      Weight:      SpO2: 100% 100%  100%    Intake/Output Summary (Last 24 hours) at 04/26/12 1208 Last data filed at 04/26/12 1150  Gross per 24 hour  Intake 2878.33 ml  Output   2600 ml  Net 278.33 ml    Exam:   General:  Pt is alert, follows commands appropriately, not in acute distress  Cardiovascular: Regular rate and rhythm, S1/S2, no murmurs, no rubs, no gallops  Respiratory: Clear to auscultation bilaterally, no wheezing, no crackles, no  rhonchi  Abdomen: Soft, non tender, non distended, bowel sounds present, no guarding  Extremities: No edema, pulses DP and PT palpable bilaterally  Neuro: Grossly nonfocal  Data Reviewed: Basic Metabolic Panel:  Recent Labs Lab 04/25/12 1430 04/26/12 0336  NA 138 135  K 3.3* 3.4*  CL 97 102  CO2 23 27  GLUCOSE 164* 161*  BUN 8 9  CREATININE 0.82 0.67  CALCIUM 8.7 8.0*   Liver Function Tests:  Recent Labs Lab 04/25/12 1430 04/26/12 0336  AST 80* 53*  ALT 76* 50  ALKPHOS 127* 102  BILITOT 1.1 0.7  PROT 8.6* 6.4  ALBUMIN 4.0 2.9*    Recent Labs Lab 04/25/12 1750  AMMONIA 47   CBC:  Recent Labs Lab 04/25/12 1430 04/26/12 0336  WBC 6.7 3.5*  NEUTROABS 3.7  --   HGB 14.8 12.1*  HCT 39.9 32.4*  MCV 86.2 86.6  PLT 142* 77*   CBG:  Recent Labs Lab 04/26/12 0743 04/26/12 1113  GLUCAP 171* 148*    Recent Results (from the past 240 hour(s))  MRSA PCR SCREENING     Status: None   Collection Time    04/25/12  9:14 PM      Result Value Range Status   MRSA by PCR NEGATIVE  NEGATIVE Final   Comment:            The GeneXpert MRSA Assay (FDA     approved for NASAL specimens  only), is one component of a     comprehensive MRSA colonization     surveillance program. It is not     intended to diagnose MRSA     infection nor to guide or     monitor treatment for     MRSA infections.     Scheduled Meds: . enoxaparin (LOVENOX) injection  40 mg Subcutaneous Q24H  . folic acid  1 mg Oral Daily  . [START ON 04/27/2012] influenza  inactive virus vaccine  0.5 mL Intramuscular Tomorrow-1000  . insulin aspart  0-15 Units Subcutaneous TID WC  . multivitamin with minerals  1 tablet Oral Daily  . pantoprazole  40 mg Oral Daily  . pneumococcal 23 valent vaccine  0.5 mL Intramuscular Tomorrow-1000  . sodium chloride  3 mL Intravenous Q12H  . sodium chloride  3 mL Intravenous Q12H  . thiamine  100 mg Oral Daily   Or  . thiamine  100 mg Intravenous Daily    Continuous Infusions: . sodium chloride 100 mL/hr (04/26/12 0813)     Debbora Presto, MD  [facility] Pager [phone]  If 7PM-7AM, please contact night-coverage [email or web address] Password [email or web address] 04/26/2012, 12:08 PM   LOS: 1 day

## 2012-04-27 DIAGNOSIS — F10229 Alcohol dependence with intoxication, unspecified: Secondary | ICD-10-CM

## 2012-04-27 LAB — CBC
MCH: 31.8 pg (ref 26.0–34.0)
MCHC: 37 g/dL — ABNORMAL HIGH (ref 30.0–36.0)
MCV: 85.9 fL (ref 78.0–100.0)
Platelets: 88 10*3/uL — ABNORMAL LOW (ref 150–400)
RDW: 13.3 % (ref 11.5–15.5)

## 2012-04-27 LAB — COMPREHENSIVE METABOLIC PANEL
AST: 53 U/L — ABNORMAL HIGH (ref 0–37)
Albumin: 2.9 g/dL — ABNORMAL LOW (ref 3.5–5.2)
Alkaline Phosphatase: 96 U/L (ref 39–117)
CO2: 30 mEq/L (ref 19–32)
Chloride: 102 mEq/L (ref 96–112)
Creatinine, Ser: 0.74 mg/dL (ref 0.50–1.35)
GFR calc non Af Amer: >90 mL/min (ref >90)
Potassium: 3.8 mEq/L (ref 3.5–5.1)
Total Bilirubin: 1.2 mg/dL (ref 0.3–1.2)

## 2012-04-27 NOTE — Progress Notes (Addendum)
Patient ID: Mitchell [name], male   DOB: 06-10-1975, 37 y.o.   MRN: [id number] [facility] PROGRESS NOTE  Mitchell [name]:[id number] DOB: 06-02-1975 DOA: 04/25/2012 PCP: Mitchell Homer, MD  Brief narrative:  Pt is 37 yo male who came to [facility] after intentional overdose on multiple home medications, including metformin, pepcid, etc. Pt also abuses alcohol. He was unable to provide history on admission but there was reported suicidal and homicidal attempt.   Principal Problem:  Drug overdose, intentional  - clinically improving and appears at his baseline this am - continue sitter at bedside but may transfer to regular bed  - psych consult placed Active Problems:  Thrombocytopenia  - unclear etiology, platelet number improving  - continue to hold heparin products and provide SCD's  Hypokalemia  - supplemented orally and potassium is within normal limits this AM - BMP in AM  Transaminitis  - in the pattern of alcohol abuse, LFT's trending down  - CIWA protocol  GERD (gastroesophageal reflux disease)  - continue Protonix  Diabetes mellitus type 2 with complications  - A1C 7.5, SSI for now  - may continue Metformin upon discharge   Consultants:  Psych Procedures/Studies:  None Antibiotics:  None  Code Status: Full  Family Communication: Pt at bedside  Disposition Plan: Transfer to medical floor today   HPI/Subjective: No events overnight.   Objective: Filed Vitals:   04/27/12 0800 04/27/12 1140 04/27/12 1200 04/27/12 1300  BP: 145/94 128/77    Pulse: 88 78    Temp: 98.5 F (36.9 C) 97.6 F (36.4 C)    TempSrc: Oral Oral    Resp: 16 13 15 15   Height:      Weight:      SpO2: 98% 98%      Intake/Output Summary (Last 24 hours) at 04/27/12 1420 Last data filed at 04/27/12 1415  Gross per 24 hour  Intake   4980 ml  Output   6450 ml  Net  -1470 ml    Exam:   General:  Pt is alert, follows commands appropriately, not in acute distress  Cardiovascular: Regular  rate and rhythm, S1/S2, no murmurs, no rubs, no gallops  Respiratory: Clear to auscultation bilaterally, no wheezing, no crackles, no rhonchi  Abdomen: Soft, non tender, non distended, bowel sounds present, no guarding  Extremities: No edema, pulses DP and PT palpable bilaterally  Neuro: Grossly nonfocal  Data Reviewed: Basic Metabolic Panel:  Recent Labs Lab 04/25/12 1430 04/26/12 0336 04/27/12 0342  NA 138 135 136  K 3.3* 3.4* 3.8  CL 97 102 102  CO2 23 27 30   GLUCOSE 164* 161* 169*  BUN 8 9 6   CREATININE 0.82 0.67 0.74  CALCIUM 8.7 8.0* 8.3*   Liver Function Tests:  Recent Labs Lab 04/25/12 1430 04/26/12 0336 04/27/12 0342  AST 80* 53* 53*  ALT 76* 50 47  ALKPHOS 127* 102 96  BILITOT 1.1 0.7 1.2  PROT 8.6* 6.4 6.6  ALBUMIN 4.0 2.9* 2.9*   No results found for this basename: LIPASE, AMYLASE,  in the last 168 hours  Recent Labs Lab 04/25/12 1750  AMMONIA 47   CBC:  Recent Labs Lab 04/25/12 1430 04/26/12 0336 04/27/12 0342  WBC 6.7 3.5* 3.8*  NEUTROABS 3.7  --   --   HGB 14.8 12.1* 12.8*  HCT 39.9 32.4* 34.6*  MCV 86.2 86.6 85.9  PLT 142* 77* 88*   Cardiac Enzymes: No results found for this basename: CKTOTAL, CKMB, CKMBINDEX, TROPONINI,  in the last 168  hours BNP: No components found with this basename: POCBNP,  CBG:  Recent Labs Lab 04/26/12 1113 04/26/12 1630 04/26/12 2141 04/27/12 0739 04/27/12 1135  GLUCAP 148* 119* 212* 256* 101*    Recent Results (from the past 240 hour(s))  MRSA PCR SCREENING     Status: None   Collection Time    04/25/12  9:14 PM      Result Value Range Status   MRSA by PCR NEGATIVE  NEGATIVE Final   Comment:            The GeneXpert MRSA Assay (FDA     approved for NASAL specimens     only), is one component of a     comprehensive MRSA colonization     surveillance program. It is not     intended to diagnose MRSA     infection nor to guide or     monitor treatment for     MRSA infections.      Scheduled Meds: . folic acid  1 mg Oral Daily  . influenza  inactive virus vaccine  0.5 mL Intramuscular Tomorrow-1000  . insulin aspart  0-15 Units Subcutaneous TID WC  . multivitamin with minerals  1 tablet Oral Daily  . pantoprazole  40 mg Oral Daily  . sodium chloride  3 mL Intravenous Q12H  . sodium chloride  3 mL Intravenous Q12H  . thiamine  100 mg Oral Daily   Or  . thiamine  100 mg Intravenous Daily   Continuous Infusions: . sodium chloride 100 mL/hr (04/26/12 1743)     Debbora Presto, MD  [facility] Pager [phone]  If 7PM-7AM, please contact night-coverage [email or web address] Password [email or web address] 04/27/2012, 2:20 PM   LOS: 2 days
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
## 2012-04-28 ENCOUNTER — Inpatient Hospital Stay (HOSPITAL_COMMUNITY)
Admission: AD | Admit: 2012-04-28 | Discharge: 2012-05-02 | DRG: 885 | Disposition: A | Discharge status: Home / Self Care | Payer: PRIVATE HEALTH INSURANCE | Source: Intra-hospital | Attending: Psychiatry | Admitting: Psychiatry

## 2012-04-28 ENCOUNTER — Encounter (HOSPITAL_COMMUNITY): Payer: Self-pay | Admitting: Intensive Care

## 2012-04-28 DIAGNOSIS — F1020 Alcohol dependence, uncomplicated: Secondary | ICD-10-CM

## 2012-04-28 DIAGNOSIS — F329 Major depressive disorder, single episode, unspecified: Secondary | ICD-10-CM

## 2012-04-28 DIAGNOSIS — E119 Type 2 diabetes mellitus without complications: Secondary | ICD-10-CM | POA: Diagnosis present

## 2012-04-28 DIAGNOSIS — Z79899 Other long term (current) drug therapy: Secondary | ICD-10-CM

## 2012-04-28 DIAGNOSIS — F321 Major depressive disorder, single episode, moderate: Principal | ICD-10-CM

## 2012-04-28 LAB — CBC
HCT: 36.4 % — ABNORMAL LOW (ref 39.0–52.0)
MCV: 85.8 fL (ref 78.0–100.0)
RBC: 4.24 MIL/uL (ref 4.22–5.81)
WBC: 4.9 10*3/uL (ref 4.0–10.5)

## 2012-04-28 LAB — BASIC METABOLIC PANEL
BUN: 6 mg/dL (ref 6–23)
CO2: 32 mEq/L (ref 19–32)
Chloride: 100 mEq/L (ref 96–112)
Creatinine, Ser: 0.82 mg/dL (ref 0.50–1.35)

## 2012-04-28 LAB — GLUCOSE, CAPILLARY
Glucose-Capillary: 217 mg/dL — ABNORMAL HIGH (ref 70–99)
Glucose-Capillary: 166 mg/dL — ABNORMAL HIGH (ref 70–99)

## 2012-04-28 MED ORDER — ONDANSETRON 4 MG PO TBDP: 4.0000 mg | ORAL_TABLET | Freq: Four times a day (QID) | ORAL | Status: AC | PRN

## 2012-04-28 MED ORDER — SENNA 8.6 MG PO TABS: 1.0000 | ORAL_TABLET | Freq: Every day | ORAL | Status: DC

## 2012-04-28 MED ORDER — POLYETHYLENE GLYCOL 3350 17 G PO PACK: 17.0000 g | PACK | Freq: Every day | ORAL | Status: DC

## 2012-04-28 MED ORDER — ADULT MULTIVITAMIN W/MINERALS CH
1.0000 | ORAL_TABLET | Freq: Every day | ORAL | Status: DC
  Administered 2012-04-28 – 2012-05-02 (×5): 1 via ORAL
  Filled 2012-04-28 (×7): qty 1

## 2012-04-28 MED ORDER — CHLORDIAZEPOXIDE HCL 25 MG PO CAPS
25.0000 mg | ORAL_CAPSULE | Freq: Four times a day (QID) | ORAL | Status: AC | PRN
  Administered 2012-04-28: 25 mg via ORAL
  Filled 2012-04-28: qty 1

## 2012-04-28 MED ORDER — LOPERAMIDE HCL 2 MG PO CAPS: 2.0000 mg | ORAL_CAPSULE | ORAL | Status: AC | PRN

## 2012-04-28 MED ORDER — TRAZODONE HCL 50 MG PO TABS
50.0000 mg | ORAL_TABLET | Freq: Every evening | ORAL | Status: DC | PRN
  Administered 2012-04-28 – 2012-05-01 (×4): 50 mg via ORAL
  Filled 2012-04-28 (×5): qty 1

## 2012-04-28 MED ORDER — VITAMIN B-1 100 MG PO TABS
100.0000 mg | ORAL_TABLET | Freq: Every day | ORAL | Status: DC
  Administered 2012-04-29 – 2012-05-02 (×4): 100 mg via ORAL
  Filled 2012-04-28 (×6): qty 1

## 2012-04-28 MED ORDER — ALUM & MAG HYDROXIDE-SIMETH 200-200-20 MG/5ML PO SUSP: 30.0000 mL | ORAL | Status: DC | PRN

## 2012-04-28 MED ORDER — POLYETHYLENE GLYCOL 3350 17 G PO PACK
17.0000 g | PACK | Freq: Every day | ORAL | Status: DC
  Administered 2012-04-28: 17 g via ORAL
  Filled 2012-04-28: qty 1

## 2012-04-28 MED ORDER — MAGNESIUM HYDROXIDE 400 MG/5ML PO SUSP: 30.0000 mL | Freq: Every day | ORAL | Status: DC | PRN

## 2012-04-28 MED ORDER — PANTOPRAZOLE SODIUM 40 MG PO TBEC
40.0000 mg | DELAYED_RELEASE_TABLET | Freq: Every day | ORAL | Status: DC
  Administered 2012-04-29 – 2012-05-02 (×4): 40 mg via ORAL
  Filled 2012-04-28 (×8): qty 1

## 2012-04-28 MED ORDER — ACETAMINOPHEN 325 MG PO TABS: 650.0000 mg | ORAL_TABLET | Freq: Four times a day (QID) | ORAL | Status: DC | PRN

## 2012-04-28 MED ORDER — INSULIN ASPART 100 UNIT/ML ~~LOC~~ SOLN
0.0000 [IU] | Freq: Three times a day (TID) | SUBCUTANEOUS | Status: DC
  Administered 2012-04-29: 3 [IU] via SUBCUTANEOUS

## 2012-04-28 MED ORDER — THIAMINE HCL 100 MG/ML IJ SOLN: 100.0000 mg | Freq: Once | INTRAMUSCULAR | Status: DC

## 2012-04-28 MED ORDER — INFLUENZA VIRUS VACC SPLIT PF IM SUSP
0.5000 mL | INTRAMUSCULAR | Status: AC
  Administered 2012-04-28: 0.5 mL via INTRAMUSCULAR
  Filled 2012-04-28: qty 0.5

## 2012-04-28 MED ORDER — SENNA 8.6 MG PO TABS
1.0000 | ORAL_TABLET | Freq: Every day | ORAL | Status: DC
  Administered 2012-04-28: 8.6 mg via ORAL
  Filled 2012-04-28: qty 1

## 2012-04-28 MED ORDER — HYDROXYZINE HCL 25 MG PO TABS: 25.0000 mg | ORAL_TABLET | Freq: Four times a day (QID) | ORAL | Status: AC | PRN
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
## 2012-04-28 NOTE — Progress Notes (Signed)
Gave report to Shelda Jakes, RN at [facility].

## 2012-04-28 NOTE — Progress Notes (Signed)
Clinical Social Work  CSW spoke with [facility] who reviewed review and reports they could accept patient if BAL is lower than 200 and needs BAL levels for today. CSW text paged MD and requested labs. CSW will continue to follow.  [place], [place] [phone]

## 2012-04-28 NOTE — Progress Notes (Signed)
Clinical Social Work  Patient accepted to [facility] by Dr. Dub Mikes in bed 305-2. RN to call report to [phone]. CSW text paged MD regarding dc. CSW will continue to follow to assist with dc planning.  [place], [place] [phone]

## 2012-04-28 NOTE — Progress Notes (Signed)
Patient admitted to [facility] for ETOH abuse. Patient speaks minimal English. Patty, RN admitted patient with help of translator. Nestor Ramp, RN called in after to perform skin search on patient and then escorted patient back to unit. Will continue to monitor patient for safety.

## 2012-04-28 NOTE — Progress Notes (Signed)
Took over Pt's care at 2330.  Pt is resting in bed with eyes closed, even and unlabored respirations.  Will continue to monitor. 

## 2012-04-28 NOTE — Tx Team (Signed)
Initial Interdisciplinary Treatment Plan  PATIENT STRENGTHS: (choose at least two) Average or above average intelligence  PATIENT STRESSORS: Health problems Medication change or noncompliance Occupational concerns Substance abuse   PROBLEM LIST: Problem List/Patient Goals Date to be addressed Date deferred Reason deferred Estimated date of resolution  ETOH Abuse 04/28/12                                                      DISCHARGE CRITERIA:  Ability to meet basic life and health needs Adequate post-discharge living arrangements Improved stabilization in mood, thinking, and/or behavior Medical problems require only outpatient monitoring  PRELIMINARY DISCHARGE PLAN: Attend aftercare/continuing care group Attend 12-step recovery group Outpatient therapy  PATIENT/FAMIILY INVOLVEMENT: This treatment plan has been presented to and reviewed with the patient, Deveron Shamoon.  The patient has been given the opportunity to ask questions and make suggestions.  Nestor Ramp MCCOLLUM 04/28/2012, 4:05 PM

## 2012-04-28 NOTE — Discharge Summary (Signed)
Physician Discharge Summary  Mitchell [name] ZOX:[id number] DOB: [birth year] DOA: 04/25/2012  PCP: [name], MD  Admit date: 04/25/2012 Discharge date: 04/28/2012  Recommendations for Outpatient Follow-up:  1. Pt will need to follow up with PCP in 2-3 weeks post discharge 2. Please obtain BMP to evaluate electrolytes and kidney function 3. Please also check CBC to evaluate Hg and Hct levels 4. Pt will be discharged to [name]  Discharge Diagnoses: Drug overdose, intentional Principal Problem:   Drug overdose, intentional Active Problems:   GERD (gastroesophageal reflux disease)   Substance induced mood disorder   Diabetes mellitus type 2 with complications   Depression   Suicidal ideation  Discharge Condition: Stable  Diet recommendation: Heart healthy diet discussed in details   Brief narrative:  Pt is 37 yo male who came to [name] after intentional overdose on multiple home medications, including metformin, pepcid, etc. Pt also abuses alcohol. He was unable to provide history on admission but there was reported suicidal and homicidal attempt.   Principal Problem:  Drug overdose, intentional  - clinically improving and appears at his baseline this am  - pt agrees with transfer to behavioral health  Active Problems:  Thrombocytopenia  - unclear etiology, platelet number improving  - no heparin provided during the hospital stay, no active bleeding during the hospital stay  Hypokalemia  - supplemented orally and potassium is within normal limits this AM  Transaminitis  - in the pattern of alcohol abuse, LFT's trending down  - CIWA protocol while in pt, no signs of withdrawal  Diabetes mellitus type 2 with complications  - A1C 7.5 - may continue Metformin upon discharge   Consultants:  Psych Procedures/Studies:  None Antibiotics:  None  Code Status: Full  Family Communication: Pt at bedside    Discharge Exam: Filed Vitals:   04/28/12 0522  BP: 138/80  Pulse: 65  Temp:  97.9 F (36.6 C)  Resp: 16   Filed Vitals:   04/27/12 1200 04/27/12 1300 04/27/12 2115 04/28/12 0522  BP:   124/83 138/80  Pulse:   73 65  Temp:   98.8 F (37.1 C) 97.9 F (36.6 C)  TempSrc:   Oral Oral  Resp: 15 15 18 16   Height:      Weight:      SpO2:   97% 97%    General: Pt is alert, follows commands appropriately, not in acute distress Cardiovascular: Regular rate and rhythm, S1/S2 +, no murmurs, no rubs, no gallops Respiratory: Clear to auscultation bilaterally, no wheezing, no crackles, no rhonchi Abdominal: Soft, non tender, non distended, bowel sounds +, no guarding Extremities: no edema, no cyanosis, pulses palpable bilaterally DP and PT Neuro: Grossly nonfocal  Discharge Instructions  Discharge Orders   Future Orders Complete By Expires     Diet - low sodium heart healthy  As directed     Increase activity slowly  As directed         Medication List    TAKE these medications       citalopram 40 MG tablet  Commonly known as:  CELEXA  Take 40 mg by mouth daily.     glimepiride 4 MG tablet  Commonly known as:  AMARYL  Take 4 mg by mouth daily before breakfast.     metFORMIN 1000 MG tablet  Commonly known as:  GLUCOPHAGE  Take 1,000 mg by mouth 2 (two) times daily with a meal.     multivitamin with minerals Tabs  Take 1 tablet by mouth daily.  omeprazole 20 MG capsule  Commonly known as:  PRILOSEC  Take 20 mg by mouth daily.     polyethylene glycol packet  Commonly known as:  MIRALAX / GLYCOLAX  Take 17 g by mouth daily.     risperiDONE 0.5 MG tablet  Commonly known as:  RISPERDAL  Take 0.5 mg by mouth at bedtime.     senna 8.6 MG Tabs  Commonly known as:  SENOKOT  Take 1 tablet (8.6 mg total) by mouth daily.     traMADol 50 MG tablet  Commonly known as:  ULTRAM  Take 100 mg by mouth every 8 (eight) hours as needed. For pain.     traZODone 50 MG tablet  Commonly known as:  DESYREL  Take 50-100 mg by mouth at bedtime. 1 TABLET AT  BEDTIME AND MAY REPEAT DOSE ONCE IF NEEDED           Follow-up Information   Follow up with [name], MD. (As needed if symptoms worsen)    Contact information:   [address] [place] [address] [phone]        The results of significant diagnostics from this hospitalization (including imaging, microbiology, ancillary and laboratory) are listed below for reference.     Microbiology: Recent Results (from the past 240 hour(s))  MRSA PCR SCREENING     Status: None   Collection Time    04/25/12  9:14 PM      Result Value Range Status   MRSA by PCR NEGATIVE  NEGATIVE Final   Comment:            The GeneXpert MRSA Assay (FDA     approved for NASAL specimens     only), is one component of a     comprehensive MRSA colonization     surveillance program. It is not     intended to diagnose MRSA     infection nor to guide or     monitor treatment for     MRSA infections.     Labs: Basic Metabolic Panel:  Recent Labs Lab 04/25/12 1430 04/26/12 0336 04/27/12 0342 04/28/12 0445  NA 138 135 136 139  K 3.3* 3.4* 3.8 3.8  CL 97 102 102 100  CO2 23 27 30  32  GLUCOSE 164* 161* 169* 159*  BUN 8 9 6 6   CREATININE 0.82 0.67 0.74 0.82  CALCIUM 8.7 8.0* 8.3* 9.3   Liver Function Tests:  Recent Labs Lab 04/25/12 1430 04/26/12 0336 04/27/12 0342  AST 80* 53* 53*  ALT 76* 50 47  ALKPHOS 127* 102 96  BILITOT 1.1 0.7 1.2  PROT 8.6* 6.4 6.6  ALBUMIN 4.0 2.9* 2.9*    Recent Labs Lab 04/25/12 1750  AMMONIA 47   CBC:  Recent Labs Lab 04/25/12 1430 04/26/12 0336 04/27/12 0342 04/28/12 0445  WBC 6.7 3.5* 3.8* 4.9  NEUTROABS 3.7  --   --   --   HGB 14.8 12.1* 12.8* 13.5  HCT 39.9 32.4* 34.6* 36.4*  MCV 86.2 86.6 85.9 85.8  PLT 142* 77* 88* 87*   CBG:  Recent Labs Lab 04/27/12 0739 04/27/12 1135 04/27/12 1720 04/28/12 0732 04/28/12 1133  GLUCAP 256* 101* 133* 166* 166*     SIGNED: Time coordinating discharge: Over 30  minutes  Debbora Presto, MD  [facility] 04/28/2012, 12:24 PM Pager [phone]  If 7PM-7AM, please contact night-coverage [email or web address] Password [email or web address]
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
## 2012-04-28 NOTE — Progress Notes (Signed)
Clinical Social Work  CSW met with patient and reviewed voluntary consent form for [name]. Patient voiced understanding and signed form which was faxed to [facility]. CSW coordinated interpreter Maxine Glenn) at [facility] for assessment at 1330 per [facility] request. CSW coordinated transportation via [place]. CSW is signing off but available if further needs arise.  [place], [place] [phone]

## 2012-04-28 NOTE — BH Assessment (Addendum)
[facility] Assessment Progress Note  Reviewed pt with Assunta Found, FNP. She agrees to accept pt to [facility] to the service of Geoffery Lyons, MD, Rm 305-2.  Information was passed on to Eino Farber, RN to register and complete assessment.  She was informed that pt will require a Spanish-English interpreter, and to please call the pt's social worker, Unk Lightning, when ready.  Please note: previously entered note on record for this pt belongs to another patient.  Doylene Canning, MA Assessment Counselor 04/28/2012 @ 09:41

## 2012-04-29 DIAGNOSIS — F1020 Alcohol dependence, uncomplicated: Secondary | ICD-10-CM | POA: Diagnosis present

## 2012-04-29 DIAGNOSIS — F321 Major depressive disorder, single episode, moderate: Principal | ICD-10-CM | POA: Diagnosis present

## 2012-04-29 DIAGNOSIS — F1010 Alcohol abuse, uncomplicated: Secondary | ICD-10-CM

## 2012-04-29 DIAGNOSIS — F339 Major depressive disorder, recurrent, unspecified: Secondary | ICD-10-CM

## 2012-04-29 LAB — GLUCOSE, CAPILLARY: Glucose-Capillary: 171 mg/dL — ABNORMAL HIGH (ref 70–99)

## 2012-04-29 MED ORDER — CITALOPRAM HYDROBROMIDE 40 MG PO TABS
40.0000 mg | ORAL_TABLET | Freq: Every day | ORAL | Status: DC
  Administered 2012-04-29 – 2012-05-02 (×4): 40 mg via ORAL
  Filled 2012-04-29 (×8): qty 1

## 2012-04-29 MED ORDER — INSULIN ASPART 100 UNIT/ML ~~LOC~~ SOLN
4.0000 [IU] | Freq: Three times a day (TID) | SUBCUTANEOUS | Status: DC
  Administered 2012-04-29 – 2012-05-02 (×11): 4 [IU] via SUBCUTANEOUS

## 2012-04-29 MED ORDER — GLIMEPIRIDE 4 MG PO TABS
4.0000 mg | ORAL_TABLET | Freq: Every day | ORAL | Status: DC
  Administered 2012-04-30 – 2012-05-02 (×3): 4 mg via ORAL
  Filled 2012-04-29 (×2): qty 1
  Filled 2012-04-29: qty 2
  Filled 2012-04-29 (×3): qty 1

## 2012-04-29 MED ORDER — METFORMIN HCL 500 MG PO TABS
1000.0000 mg | ORAL_TABLET | Freq: Two times a day (BID) | ORAL | Status: DC
  Administered 2012-04-29 – 2012-05-02 (×6): 1000 mg via ORAL
  Filled 2012-04-29 (×12): qty 2

## 2012-04-29 MED ORDER — ADULT MULTIVITAMIN W/MINERALS CH: 1.0000 | ORAL_TABLET | Freq: Every day | ORAL | Status: DC

## 2012-04-29 MED ORDER — RISPERIDONE 0.5 MG PO TABS
0.5000 mg | ORAL_TABLET | Freq: Every day | ORAL | Status: DC
  Administered 2012-04-29 – 2012-05-01 (×3): 0.5 mg via ORAL
  Filled 2012-04-29 (×6): qty 1

## 2012-04-29 MED ORDER — INSULIN ASPART 100 UNIT/ML ~~LOC~~ SOLN
0.0000 [IU] | Freq: Three times a day (TID) | SUBCUTANEOUS | Status: DC
  Administered 2012-04-29: 2 [IU] via SUBCUTANEOUS
  Administered 2012-04-29: 3 [IU] via SUBCUTANEOUS
  Administered 2012-04-30: 5 [IU] via SUBCUTANEOUS
  Administered 2012-04-30 – 2012-05-01 (×2): 2 [IU] via SUBCUTANEOUS
  Administered 2012-05-01 – 2012-05-02 (×5): 3 [IU] via SUBCUTANEOUS

## 2012-04-29 NOTE — [facility] Suicide Risk Assessment (Signed)
Suicide Risk Assessment  Admission Assessment     Nursing information obtained from:    Demographic factors:   male Current Mental Status:   no si, no hi, logical, depressed mood, affect ristricted, no avh Loss Factors:   unable to funtions Historical Factors:   past SI attempts Risk Reduction Factors:   family support  CLINICAL FACTORS:   Alcohol/Substance Abuse/Dependencies  COGNITIVE FEATURES THAT CONTRIBUTE TO RISK:  Closed-mindedness    SUICIDE RISK:   Mild:  Suicidal ideation of limited frequency, intensity, duration, and specificity.  There are no identifiable plans, no associated intent, mild dysphoria and related symptoms, good self-control (both objective and subjective assessment), few other risk factors, and identifiable protective factors, including available and accessible social support.  PLAN OF CARE:   Continue detox Re-start celexa  I certify that inpatient services furnished can reasonably be expected to improve the patient's condition.  Wonda Cerise 04/29/2012, 1:17 PM

## 2012-04-29 NOTE — H&P (Signed)
  Pt was seen by me today and I agree with the key elements documented in H&P.  

## 2012-04-29 NOTE — [facility] Group Notes (Signed)
[facility] LCSW Group Therapy  04/29/2012 10am  Type of Therapy:  Group Therapy  Participation Level:  Minimal  Participation Quality:  Attentive  Affect:  Blunted  Cognitive:  Appropriate  Insight:  Developing/Improving  Engagement in Therapy:  Developing/Improving  Modes of Intervention:  Exploration  Summary of Progress/Problems:  The main focus of today's process group was for the patient to identify ways in which they have in the past sabotaged their own recovery. Motivational Interviewing was utilized to ask the group members what they get out of their substance use, and how their behavior interferes with who they want to be.  The Stages of Change were explained, and members rated their motivation to change on a scale of 0 (no desire) to 10 (completely committed).  The patient expressed (through interpreter) that his motivation to change is a 10 out of 10.    [name] 04/29/2012, 12:47 PM

## 2012-04-29 NOTE — Progress Notes (Signed)
D.  Pt pleasant on approach, spoke through interpreter.  Denies complaints, denies withdrawal symptoms of any type.  Positive for evening group, interacting appropriately within milieu.  Denies SI/HI/hallucinations at this time.  A.  Support and encouragement offered  R.  Pt remains safe on unit, will continue to monitor.
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
## 2012-04-29 NOTE — Progress Notes (Signed)
[name] is UAL on the 300 hall today, using his walker ...tolerated fair. HE is bathed and well dressed. HE makes eye contact, he smiles pleasantly and shakes his head in appreciation when he interacts with this nurse. . Via interpreter, he denies SI within the past 24 hrs and  he rated his depression and hopelessness " 7 / 7 ".   A HE takes his meds as they are ordered and he attends his groups with his interpreter to process he denies the need for prn medication and / or  The presence of withdrawal symptoms.   R Safety is in place and POC cont with nursing trying to  Locate his clothing and belongings ( that were left at [facility] where he was a patient, prior to his transfer here).

## 2012-04-30 DIAGNOSIS — F1020 Alcohol dependence, uncomplicated: Secondary | ICD-10-CM

## 2012-04-30 DIAGNOSIS — F321 Major depressive disorder, single episode, moderate: Principal | ICD-10-CM

## 2012-04-30 LAB — GLUCOSE, CAPILLARY
Glucose-Capillary: 131 mg/dL — ABNORMAL HIGH (ref 70–99)
Glucose-Capillary: 143 mg/dL — ABNORMAL HIGH (ref 70–99)
Glucose-Capillary: 115 mg/dL — ABNORMAL HIGH (ref 70–99)

## 2012-04-30 MED ORDER — GLUCERNA SHAKE PO LIQD
237.0000 mL | Freq: Three times a day (TID) | ORAL | Status: DC
  Administered 2012-05-01 – 2012-05-02 (×4): 237 mL via ORAL
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
## 2012-04-30 NOTE — Progress Notes (Signed)
Psychoeducational Group Note  Date:  04/30/2012 Time: 1015 Group Topic/Focus:  Making Healthy Choices:   The focus of this group is to help patients identify negative/unhealthy choices they were using prior to admission and identify positive/healthier coping strategies to replace them upon discharge.  Participation Level:  Active  Participation Quality:  Appropriate  Affect:  Appropriate  Cognitive:  Appropriate  Insight:  Engaged  Engagement in Group:  Engaged  Additional Comments:    Mitchell [name] 4:06 PM. 04/30/2012

## 2012-04-30 NOTE — Progress Notes (Signed)
Thankfulness  Group Note  Date: 04/30/2012 Time: 0900  Group Topic/Focus:  Idnetifying Thankfulness :   The focus of this group is to help patients identify people / events and things in their lives they are thankful for..  Participation Level:  Active  Participation Quality:  Appropriate  Affect:  Appropriate  Cognitive:  Appropriate  Insight:  Engaged  Engagement in Group:  Engaged  Additional Comments:    04/30/2012,9:53 AM Rich Brave

## 2012-04-30 NOTE — Progress Notes (Signed)
[facility] Group Notes:  (Nursing/MHT/Case Management/Adjunct)  Date:  04/29/2012   Time:  2100  Type of Therapy:  wrap up group  Participation Level:  Active  Participation Quality:  Appropriate, Attentive and Sharing  Affect:  Appropriate  Cognitive:  Alert and Appropriate  Insight:  Appropriate  Engagement in Group:  Engaged  Modes of Intervention:  Clarification, Education and Support  Summary of Progress/Problems: Pt reports wanting to understand more in his life and remake his life.  He is grateful for this sober day and for his children aged 84 and 13 years.  Shelah Lewandowsky 04/30/2012, 3:46 AM

## 2012-04-30 NOTE — BHH Group Notes (Signed)
[facility] LCSW Group Therapy  04/30/2012 10:00-11:00am  Summary of Progress/Problems: The main focus of today's process group was to identify the patient's current support system and decide on other supports that can be put in place. Four definitions/levels of support were discussed and an exercise was utilized to show how much stronger we become with additional supports providing accountability, improved physical and emotional health, and better problem-solving skills. An emphasis was placed on using counselor, doctor, therapy groups, 12-step groups, and problem-specific support groups to expand supports. The patient expressed little during group, but listened intently to his interpreter.  Type of Therapy:  Group Therapy  Participation Level:  Active  Participation Quality:  Appropriate and Attentive  Affect:  Blunted  Cognitive:  Alert, Appropriate and Oriented  Insight:  Engaged  Engagement in Therapy:  Engaged  Modes of Intervention:  Education, Exploration and Support   [name] 04/30/2012, 12:18 PM

## 2012-04-30 NOTE — Progress Notes (Signed)
D.  Pt pleasant on approach, interpreter present during assessment.  Pt denied complaints of any kind, no s/s of withdrawal present.  Pt positive for evening AA group, interacting appropriately within milieu.  Denies SI/HI/hallucinations.  A.  Support and encouragement offered  R.  Pt remains safe on unit, will continue to monitor.

## 2012-04-30 NOTE — Progress Notes (Signed)
D Pt is seen OOB UA:L on the 300 hall today. HE smiles much more today. HE stays in the group room, interacting with his interpreter, as well as just sitting, for longer periods of time today. HE attends his groups and pays attention to the discussion as evidenced by him shaking his head ( in agreement) many times during the discussion.  A He takes his scheduled meds as they are ordered. HE receives sliding scale insulin as ordered by the MD.   R Safety is in place and POC includes continuing to foster therapetuic relationship.
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
## 2012-05-01 DIAGNOSIS — F329 Major depressive disorder, single episode, unspecified: Secondary | ICD-10-CM

## 2012-05-01 LAB — GLUCOSE, CAPILLARY
Glucose-Capillary: 129 mg/dL — ABNORMAL HIGH (ref 70–99)
Glucose-Capillary: 182 mg/dL — ABNORMAL HIGH (ref 70–99)

## 2012-05-01 NOTE — Progress Notes (Signed)
Adult Psychoeducational Group Note  Date:  05/01/2012 Time:  11:07 AM  Group Topic/Focus:  Coping With Mental Health Crisis:   The purpose of this group is to help patients identify strategies for coping with mental health crisis.  Group discusses possible causes of crisis and ways to manage them effectively.  Participation Level:  Minimal  Participation Quality:  Inattentive  Affect:  Flat  Cognitive:  Oriented  Insight: Lacking  Engagement in Group:  None  Modes of Intervention:  Activity  Additional Comments:  Pt came late to group and did not participate  Cyani Kallstrom T 05/01/2012, 11:07 AM

## 2012-05-01 NOTE — BHH Group Notes (Signed)
[facility] LCSW Aftercare Discharge Planning Group Note   05/01/2012  8:45 AM  Participation Quality:    Mood/Affect:  Anxious and Depressed  Depression Rating:  7  Anxiety Rating:  7   Thoughts of Suicide:  Negative  Will you contract for safety?   NA  Current AVH:  Negative  Plan for Discharge/Comments:   Patient reports plan to continue, or rather go back to [employer]  Patient unable to work until foot heals.   Transportation Means: Uncertain  Supports: AA supports  [employer], Mitchell [name]

## 2012-05-01 NOTE — Progress Notes (Signed)
Patient did attend the evening speaker AA meeting. Pt missed first half of meeting due to a nutritional consult. Pt's interpreter was with him during consult and meeting.

## 2012-05-01 NOTE — Progress Notes (Signed)
Adult Psychoeducational Group Note  Date:  05/01/2012 Time:  1:38 PM  Group Topic/Focus:  Self Care:   The focus of this group is to help patients understand the importance of self-care in order to improve or restore emotional, physical, spiritual, interpersonal, and financial health.  Participation Level:  Active  Participation Quality:  Appropriate and Attentive  Affect:  Appropriate  Cognitive:  Appropriate  Insight: Appropriate  Engagement in Group:  Engaged  Modes of Intervention:  Discussion  Additional Comments:  Pt was appropriate and had a translator while attending this group. Pt's translator stated the pt loves to schedule time with his children and wants to do better at asking for help.   Sharyn Lull 05/01/2012, 1:38 PM

## 2012-05-01 NOTE — Tx Team (Signed)
Interdisciplinary Treatment Plan Update (Adult)  Date: 05/01/2012  Time Reviewed: 11:50 AM   Progress in Treatment: Attending groups: Yes Participating in groups: Yes Taking medication as prescribed:  Yes Tolerating medication:  Yes Family/Significant othe contact made: No Patient understands diagnosis: Yes Discussing patient identified problems/goals with staff: Yes Medical problems stabilized or resolved:  Yes Denies suicidal/homicidal ideation: Yes Patient has not harmed self or Others: Yes  New problem(s) identified: None Identified  Discharge Plan or Barriers:  CSW is assessing for appropriate referrals.   Additional comments: N/A  Reason for Continuation of Hospitalization: Anxiety Depression Medication stabilization Withdrawal symptoms   Estimated length of stay: 3-4 days  For review of initial/current patient goals, please see plan of care.  Attendees: Patient:     Family:     Physician:  Geoffery Lyons 05/01/2012 11:50 AM   Nursing:   Berneice Heinrich,   RN 05/01/2012 11:50 AM   Clinical Social Worker Ronda Fairly 05/01/2012 11:50 AM   Other:  Roswell Miners, RN 05/01/2012 11:50 AM   Other:  Georgina Quint, [place] PA Student 05/01/2012 11:50 AM   Other:     Other:      Scribe for Treatment Team:   Carney Bern, LCSWA  05/01/2012 10:50 AM

## 2012-05-01 NOTE — Progress Notes (Signed)
Patient ID: Mitchell [name], male   DOB: 02/22/1975, 37 y.o.   MRN: [id number] He has been up and to groups interpreter with him in part of the groups today. When asked he denies thoughts of SI . He will  Indicate he understands simple things such as  If he is having w/d symptom ect.

## 2012-05-01 NOTE — BHH Group Notes (Signed)
[facility] LCSW Group Therapy  05/01/2012 1:15 PM  Type of Therapy:  Group Therapy  Participation Level:  Active  Participation Quality:  Appropriate and Attentive  Affect:  Appropriate  Cognitive:  Alert and Appropriate  Insight:  Developing/Improving and Engaged  Engagement in Therapy:  Developing/Improving and Engaged  Modes of Intervention:  Clarification, Confrontation, Discussion, Education, Exploration, Limit-setting, Orientation, Problem-solving, Rapport Building, Dance movement psychotherapist, Socialization and Support  Summary of Progress/Problems: Interpreter was present for this group.  The topic for group today was overcoming obstacles.  Pt discussed overcoming obstacles and what this means for pt. Pt states that his biggest obstacle he faces is the fact that he lost his family and job due to alcoholism.  Pt states that he had a good paying job and has connections to get back to working, but has to overcome his addiction first.  Pt states that his negative friends are an obstacle as well.  Pt states that to overcome this he plans to leave his negative friends along and surround himself with positive friends and go to [employer]  Pt states that he went before but stopped going or didn't take serious.  Pt states that he is motivated to go to AA this time and stop drinking.    Carmina Miller 05/01/2012, 2:24 PM

## 2012-05-01 NOTE — Progress Notes (Signed)
[facility] MD Progress Note  05/01/2012 5:15 PM Mitchell [name]  MRN:  [id number] Subjective:  Endorses that he is feeling very depressed. States that he is looking at what his life has become. He was confronted by his 38 Y/o about the way he used to work and provide for them in the past,  and now the only thing he does is drink. They see him lying down "drunk" on the side walk. His son told him that he should be embarrassed about himself. He admits that he cant control his drinking.He has been going from place to place does not have a stable place to live. States that his uncle is going to allow him to stay there for a little while. He had prolonged sobriety until he got his toe infected and had to have amputation and lost his steady job. Diagnosis:  Alcohol Dependence, Major Depression   ADL's:  Intact  Sleep: Fair  Appetite:  Fair  Suicidal Ideation:  Plan:  denies Intent:  denies Means:  denies Homicidal Ideation:  Plan:  denies Intent:  denies Means:  denies AEB (as evidenced by):  Psychiatric Specialty Exam: Review of Systems  Constitutional: Negative.   HENT: Negative.   Eyes: Negative.   Respiratory: Negative.   Cardiovascular: Negative.   Genitourinary: Negative.   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   Neurological: Negative.   Endo/Heme/Allergies: Negative.   Psychiatric/Behavioral: Positive for depression and substance abuse. The patient is nervous/anxious and has insomnia.     Blood pressure 122/80, pulse 66, temperature 96.4 F (35.8 C), temperature source Oral, resp. rate 20, height 5\' 2"  (1.575 m), weight 71.668 kg (158 lb), SpO2 99.00%.Body mass index is 28.89 kg/(m^2).  General Appearance: Fairly Groomed  Patent attorney::  Fair  Speech:  Clear and Coherent and Slow  Volume:  Decreased  Mood:  Anxious and Depressed  Affect:  Restricted  Thought Process:  Coherent and Goal Directed  Orientation:  Full (Time,  Place, and Person)  Thought Content:  worries, concerns, poor self esteem  Suicidal Thoughts:  No  Homicidal Thoughts:  No  Memory:  Immediate;   Fair Recent;   Fair Remote;   Fair  Judgement:  Fair  Insight:  Shallow  Psychomotor Activity:  Restlessness  Concentration:  Fair  Recall:  Fair  Akathisia:  No  Handed:  Right  AIMS (if indicated):     Assets:  Desire for Improvement  Sleep:  Number of Hours: 5.25   Current Medications: Current Facility-Administered Medications  Medication Dose Route Frequency Provider Last Rate Last Dose  . acetaminophen (TYLENOL) tablet 650 mg  650 mg Oral Q6H PRN Shuvon Rankin, NP      . alum & mag hydroxide-simeth (MAALOX/MYLANTA) 200-200-20 MG/5ML suspension 30 mL  30 mL Oral Q4H PRN Shuvon Rankin, NP      . citalopram (CELEXA) tablet 40 mg  40 mg Oral Daily Sanjuana Kava, NP   40 mg at 05/01/12 0833  . feeding supplement (GLUCERNA SHAKE) liquid 237 mL  237 mL Oral TID BM Jeoffrey Massed, RD   237 mL at 05/01/12 0834  . glimepiride (AMARYL) tablet 4 mg  4 mg Oral QAC breakfast Sanjuana Kava, NP   4 mg at 05/01/12 7829  . insulin aspart (novoLOG) injection 0-15 Units  0-15 Units Subcutaneous TID WC Sanjuana Kava, NP   3 Units at 05/01/12 1710  . insulin aspart (  novoLOG) injection 4 Units  4 Units Subcutaneous TID WC Sanjuana Kava, NP   4 Units at 05/01/12 1710  . magnesium hydroxide (MILK OF MAGNESIA) suspension 30 mL  30 mL Oral Daily PRN Shuvon Rankin, NP      . metFORMIN (GLUCOPHAGE) tablet 1,000 mg  1,000 mg Oral BID WC Sanjuana Kava, NP   1,000 mg at 05/01/12 1708  . multivitamin with minerals tablet 1 tablet  1 tablet Oral Daily Shuvon Rankin, NP   1 tablet at 05/01/12 0833  . pantoprazole (PROTONIX) EC tablet 40 mg  40 mg Oral Daily Shuvon Rankin, NP   40 mg at 05/01/12 0833  . risperiDONE (RISPERDAL) tablet 0.5 mg  0.5 mg Oral QHS Sanjuana Kava, NP   0.5 mg at 04/30/12 2232  . thiamine (B-1) injection 100 mg  100 mg Intramuscular Once Shuvon  Rankin, NP      . thiamine (VITAMIN B-1) tablet 100 mg  100 mg Oral Daily Shuvon Rankin, NP   100 mg at 05/01/12 0833  . traZODone (DESYREL) tablet 50 mg  50 mg Oral QHS PRN,MR X 1 Nanine Means, NP   50 mg at 04/30/12 2232    Lab Results:  Results for orders placed during the hospital encounter of 04/28/12 (from the past 48 hour(s))  GLUCOSE, CAPILLARY     Status: Abnormal   Collection Time    04/29/12  9:13 PM      Result Value Range   Glucose-Capillary 185 (*) 70 - 99 mg/dL   Comment 1 Notify RN     Comment 2 Documented in Chart    GLUCOSE, CAPILLARY     Status: Abnormal   Collection Time    04/30/12  6:20 AM      Result Value Range   Glucose-Capillary 213 (*) 70 - 99 mg/dL  GLUCOSE, CAPILLARY     Status: Abnormal   Collection Time    04/30/12 11:55 AM      Result Value Range   Glucose-Capillary 143 (*) 70 - 99 mg/dL   Comment 1 Documented in Chart     Comment 2 Notify RN    GLUCOSE, CAPILLARY     Status: Abnormal   Collection Time    04/30/12  4:56 PM      Result Value Range   Glucose-Capillary 115 (*) 70 - 99 mg/dL   Comment 1 Documented in Chart     Comment 2 Notify RN    GLUCOSE, CAPILLARY     Status: Abnormal   Collection Time    04/30/12  9:27 PM      Result Value Range   Glucose-Capillary 155 (*) 70 - 99 mg/dL   Comment 1 Notify RN     Comment 2 Documented in Chart    GLUCOSE, CAPILLARY     Status: Abnormal   Collection Time    05/01/12  6:09 AM      Result Value Range   Glucose-Capillary 156 (*) 70 - 99 mg/dL   Comment 1 Notify RN     Comment 2 Documented in Chart    GLUCOSE, CAPILLARY     Status: Abnormal   Collection Time    05/01/12 11:58 AM      Result Value Range   Glucose-Capillary 129 (*) 70 - 99 mg/dL  GLUCOSE, CAPILLARY     Status: Abnormal   Collection Time    05/01/12  4:49 PM      Result Value Range   Glucose-Capillary 182 (*)  70 - 99 mg/dL    Physical Findings: AIMS: Facial and Oral Movements Muscles of Facial Expression: None,  normal Lips and Perioral Area: None, normal Jaw: None, normal Tongue: None, normal,Extremity Movements Upper (arms, wrists, hands, fingers): None, normal Lower (legs, knees, ankles, toes): None, normal, Trunk Movements Neck, shoulders, hips: None, normal, Overall Severity Severity of abnormal movements (highest score from questions above): None, normal Incapacitation due to abnormal movements: None, normal Patient's awareness of abnormal movements (rate only patient's report): No Awareness, Dental Status Current problems with teeth and/or dentures?: No Does patient usually wear dentures?: No  CIWA:  CIWA-Ar Total: 0 COWS:     Treatment Plan Summary: Daily contact with patient to assess and evaluate symptoms and progress in treatment Medication management  Plan: Supportive approach/coping skills/relapse prevention            Continue Citalopram, consider augmentation  Medical Decision Making Problem Points:  Review of psycho-social stressors (1) Data Points:  Review of medication regiment & side effects (2)  I certify that inpatient services furnished can reasonably be expected to improve the patient's condition.   Tahiry Spicer A 05/01/2012, 5:15 PM

## 2012-05-01 NOTE — Progress Notes (Signed)
D: Patient in the dayroom on approach.  Patient states he is good.  Patient states he has gone to groups today.  Patient states he is eating well.  Patient states he is learning to control himself. Patient denies SI/HI and denies AVH.  Patient states he has no concerns at this time. A: Staff to monitor Q 15 mins for safety.  Encouragement and support offered.  Scheduled medications administered per orders.  Trazodone prn administered for sleep. R: Patient remains safe on the unit.  Patient tried to watch the movie for group but had difficulty understanding and interpreter  Had difficulty following the video because it was so fast.  Patient taking administered medications.

## 2012-05-02 LAB — GLUCOSE, CAPILLARY
Glucose-Capillary: 165 mg/dL — ABNORMAL HIGH (ref 70–99)
Glucose-Capillary: 192 mg/dL — ABNORMAL HIGH (ref 70–99)

## 2012-05-02 MED ORDER — GLIMEPIRIDE 4 MG PO TABS: 4.0000 mg | ORAL_TABLET | Freq: Every day | ORAL | Status: DC

## 2012-05-02 MED ORDER — CITALOPRAM HYDROBROMIDE 40 MG PO TABS: 40.0000 mg | ORAL_TABLET | Freq: Every day | ORAL | Status: DC

## 2012-05-02 MED ORDER — ADULT MULTIVITAMIN W/MINERALS CH: 1.0000 | ORAL_TABLET | Freq: Every day | ORAL | Status: DC

## 2012-05-02 MED ORDER — RISPERIDONE 0.5 MG PO TABS: 0.5000 mg | ORAL_TABLET | Freq: Every day | ORAL | Status: DC

## 2012-05-02 MED ORDER — METFORMIN HCL 1000 MG PO TABS: 1000.0000 mg | ORAL_TABLET | Freq: Two times a day (BID) | ORAL | Status: DC

## 2012-05-02 MED ORDER — TRAZODONE HCL 50 MG PO TABS: 50.0000 mg | ORAL_TABLET | Freq: Every evening | ORAL | Status: DC | PRN

## 2012-05-02 MED ORDER — OMEPRAZOLE 20 MG PO CPDR: 20.0000 mg | DELAYED_RELEASE_CAPSULE | Freq: Every day | ORAL | Status: DC

## 2012-05-02 NOTE — Progress Notes (Signed)
Adult Psychoeducational Group Note  Date:  05/02/2012 Time:  11:30 AM  Group Topic/Focus:  Recovery Goals:   The focus of this group is to identify appropriate goals for recovery and establish a plan to achieve them.  Participation Level:  Active  Participation Quality:  Appropriate, Attentive and Sharing  Affect:  Appropriate  Cognitive:  Alert and Appropriate  Insight: Appropriate  Engagement in Group:  Engaged  Modes of Intervention:  Discussion  Additional Comments:  Pt was appropriate and alert while attending group. Pt stated that self-esteem and alcoholism is two barriers that are standing between him and recovery.   Sharyn Lull 05/02/2012, 11:30 AM
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
## 2012-05-02 NOTE — [facility] Suicide Risk Assessment (Addendum)
[facility] INPATIENT:  Family/Significant Other Suicide Prevention Education  Suicide Prevention Education:  Patient Refusal for Family/Significant Other Suicide Prevention Education: The patient Mitchell [name] has refused to provide written consent for family/significant other to be provided Family/Significant Other Suicide Prevention Education during admission and/or prior to discharge. This is mostly due to Uncle with whom patient will be living with not speaking english and english speaking friends not being supportive of patient's sobriety.   Physician notified. Writer provided suicide prevention education directly to patient through interpretor; conversation included risk factors, warning signs and resources to contact for help. Mobile crisis services explained and contact card placed in chart for pt to receive at discharge.  [name] 05/02/2012, 3:00 PM
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
## 2012-05-02 NOTE — Progress Notes (Signed)
[facility] LCSW Aftercare Discharge Planning Group Note   05/02/2012 8:45 AM  Participation Quality:  Appropriate, patient attended with interpreter  Mood/Affect:  Appropriate  Depression Rating:  0  Anxiety Rating:  0  Thoughts of Suicide:  No  Will you contract for safety?   NA  Current AVH:  Negative  Plan for Discharge/Comments:  Patient to follow up at [facility] the [place] where he will be able to access a spanish speaking interpreter.   Transportation Means: Bus   Supports: Stann Ore, Julious Payer

## 2012-05-02 NOTE — Progress Notes (Signed)
[facility] Adult Case Management Discharge Plan :  Will you be returning to the same living situation after discharge: No. Patient will be staying with uncle At discharge, do you have transportation home?:Yes,  family Do you have the ability to pay for your medications:Yes,  patient reports he can afford  Release of information consent forms completed and in the chart;  Patient's signature needed at discharge.  Patient to Follow up at: Follow-up Information   Follow up with [facility] [place] On 05/04/2012. (Go to [facility] [place] for assessment between the hours of 8:30 and 12 or 1PM and 2:30 for initial assessment; they will then set you up with spanish speaking therapist and another appointment for medication management)    Contact information:   [address],  [place], [place] [address] [facility] [phone] FAX [phone]      Patient denies SI/HI:   Yes,  denies both    Safety Planning and Suicide Prevention discussed:  Yes,  with patient through interpretor  [name] 05/02/2012, 6:03 PM

## 2012-05-02 NOTE — Progress Notes (Signed)
[facility] LCSW Group Therapy  05/02/2012 1:15 PM  Type of Therapy:  Group Therapy  Participation Level:  Active  Participation Quality:  Appropriate, Attentive and Sharing  Affect:  Appropriate  Cognitive:  Appropriate  Insight:  Improving  Engagement in Therapy:  Improving  Modes of Intervention:  Discussion, Education, Orientation, Rapport Building, Socialization and Support  Summary of Progress/Problems: Patient attended group presentation by staff member of  [facility] ([facility]). Mitchell [name] was attentive and appropriate during session. He asked about Hispanic services offered and [facility] and CSW agreed to contact. (Hispanic group is for women only yet they do have a Hispanic peer support person; patient given information.)  Harrill, Mitchell [name]

## 2012-05-02 NOTE — Progress Notes (Signed)
Patient ID: Mitchell [name], male   DOB: May 18, 1975, 37 y.o.   MRN: [id number] Patient discharged per physician order; patient denies SI/HI and A/V hallucinations; patient had an interpreter and everything was reviewed in the presence of his spanish speaking interpreter and patient reported that he had no other questions or concerns about his prescriptions, samples, copy of AVS and it was received; patient left the unit ambulatory with his walker as he came in with his sister; patient signed and verbalized that all his belongings were returrned

## 2012-05-02 NOTE — Progress Notes (Signed)
Recreation Therapy Notes  Date: 04.08.2014  Time: 3:00pm  Location: [address] Day Room  Group Topic/Focus: Problem Solving   Participation Level:  Did not attend   Jearl Klinefelter, LRT/CTRS  Jearl Klinefelter 05/02/2012 4:02 PM

## 2012-05-05 NOTE — Progress Notes (Signed)
Patient Discharge Instructions:  After Visit Summary (AVS):   Faxed to:  05/05/12 Discharge Summary Note:   Faxed to:  05/05/12 Psychiatric Admission Assessment Note:   Faxed to:  05/05/12 Suicide Risk Assessment - Discharge Assessment:   Faxed to:  05/05/12 Faxed/Sent to the Next Level Care provider:  05/05/12 Faxed to [facility] the [facility] @ [phone]  Jerelene Redden, 05/05/2012, 4:07 PM

## 2012-05-15 ENCOUNTER — Other Ambulatory Visit: Payer: Self-pay | Admitting: *Deleted

## 2012-05-15 MED ORDER — GLIMEPIRIDE 4 MG PO TABS: 4.0000 mg | ORAL_TABLET | Freq: Every day | ORAL | Status: DC

## 2012-07-03 ENCOUNTER — Encounter (HOSPITAL_COMMUNITY): Payer: Self-pay | Admitting: Emergency Medicine

## 2012-07-03 ENCOUNTER — Other Ambulatory Visit: Payer: Self-pay

## 2012-07-03 ENCOUNTER — Emergency Department (HOSPITAL_COMMUNITY)
Admission: EM | Admit: 2012-07-03 | Discharge: 2012-07-04 | Disposition: A | Discharge status: Other Acute Inpatient Hospital | Payer: No Typology Code available for payment source | Attending: Emergency Medicine | Admitting: Emergency Medicine

## 2012-07-03 DIAGNOSIS — F3289 Other specified depressive episodes: Secondary | ICD-10-CM | POA: Insufficient documentation

## 2012-07-03 DIAGNOSIS — R443 Hallucinations, unspecified: Secondary | ICD-10-CM | POA: Insufficient documentation

## 2012-07-03 DIAGNOSIS — F1020 Alcohol dependence, uncomplicated: Secondary | ICD-10-CM

## 2012-07-03 DIAGNOSIS — H538 Other visual disturbances: Secondary | ICD-10-CM | POA: Insufficient documentation

## 2012-07-03 DIAGNOSIS — R42 Dizziness and giddiness: Secondary | ICD-10-CM | POA: Insufficient documentation

## 2012-07-03 DIAGNOSIS — Z8719 Personal history of other diseases of the digestive system: Secondary | ICD-10-CM | POA: Insufficient documentation

## 2012-07-03 DIAGNOSIS — T50902A Poisoning by unspecified drugs, medicaments and biological substances, intentional self-harm, initial encounter: Secondary | ICD-10-CM

## 2012-07-03 DIAGNOSIS — R109 Unspecified abdominal pain: Secondary | ICD-10-CM | POA: Insufficient documentation

## 2012-07-03 DIAGNOSIS — E118 Type 2 diabetes mellitus with unspecified complications: Secondary | ICD-10-CM | POA: Diagnosis present

## 2012-07-03 DIAGNOSIS — R5383 Other fatigue: Secondary | ICD-10-CM | POA: Insufficient documentation

## 2012-07-03 DIAGNOSIS — E119 Type 2 diabetes mellitus without complications: Secondary | ICD-10-CM | POA: Insufficient documentation

## 2012-07-03 DIAGNOSIS — F411 Generalized anxiety disorder: Secondary | ICD-10-CM | POA: Insufficient documentation

## 2012-07-03 DIAGNOSIS — Z8739 Personal history of other diseases of the musculoskeletal system and connective tissue: Secondary | ICD-10-CM | POA: Insufficient documentation

## 2012-07-03 DIAGNOSIS — R45851 Suicidal ideations: Secondary | ICD-10-CM | POA: Insufficient documentation

## 2012-07-03 DIAGNOSIS — Z79899 Other long term (current) drug therapy: Secondary | ICD-10-CM | POA: Insufficient documentation

## 2012-07-03 DIAGNOSIS — F329 Major depressive disorder, single episode, unspecified: Secondary | ICD-10-CM

## 2012-07-03 DIAGNOSIS — K219 Gastro-esophageal reflux disease without esophagitis: Secondary | ICD-10-CM | POA: Insufficient documentation

## 2012-07-03 DIAGNOSIS — R5381 Other malaise: Secondary | ICD-10-CM | POA: Insufficient documentation

## 2012-07-03 DIAGNOSIS — F1910 Other psychoactive substance abuse, uncomplicated: Secondary | ICD-10-CM | POA: Insufficient documentation

## 2012-07-03 DIAGNOSIS — R51 Headache: Secondary | ICD-10-CM | POA: Insufficient documentation

## 2012-07-03 DIAGNOSIS — Z794 Long term (current) use of insulin: Secondary | ICD-10-CM | POA: Insufficient documentation

## 2012-07-03 LAB — URINALYSIS, ROUTINE W REFLEX MICROSCOPIC
Bilirubin Urine: NEGATIVE
Specific Gravity, Urine: 1.027 (ref 1.005–1.030)
Urobilinogen, UA: 0.2 mg/dL (ref 0.0–1.0)
pH: 5 (ref 5.0–8.0)

## 2012-07-03 LAB — CBC WITH DIFFERENTIAL/PLATELET
Basophils Absolute: 0 10*3/uL (ref 0.0–0.1)
HCT: 37 % — ABNORMAL LOW (ref 39.0–52.0)
Hemoglobin: 13.9 g/dL (ref 13.0–17.0)
Lymphocytes Relative: 18 % (ref 12–46)
Lymphs Abs: 1.2 10*3/uL (ref 0.7–4.0)
Monocytes Absolute: 0.7 10*3/uL (ref 0.1–1.0)
Neutro Abs: 4.7 10*3/uL (ref 1.7–7.7)
RBC: 4.37 MIL/uL (ref 4.22–5.81)
RDW: 13.1 % (ref 11.5–15.5)
WBC: 6.7 10*3/uL (ref 4.0–10.5)

## 2012-07-03 LAB — SALICYLATE LEVEL: Salicylate Lvl: <2 mg/dL — ABNORMAL LOW (ref 2.8–20.0)

## 2012-07-03 LAB — BLOOD GAS, VENOUS
Acid-base deficit: 3.5 mmol/L — ABNORMAL HIGH (ref 0.0–2.0)
O2 Saturation: 69 %
Patient temperature: 98.6
pO2, Ven: 40.4 mmHg (ref 30.0–45.0)

## 2012-07-03 LAB — BASIC METABOLIC PANEL
Chloride: 104 mEq/L (ref 96–112)
Creatinine, Ser: 0.68 mg/dL (ref 0.50–1.35)
GFR calc Af Amer: >90 mL/min (ref >90)
Potassium: 3.6 mEq/L (ref 3.5–5.1)
Sodium: 137 mEq/L (ref 135–145)

## 2012-07-03 LAB — HEPATIC FUNCTION PANEL
ALT: 24 U/L (ref 0–53)
AST: 29 U/L (ref 0–37)
Bilirubin, Direct: 0.2 mg/dL (ref 0.0–0.3)

## 2012-07-03 LAB — RAPID URINE DRUG SCREEN, HOSP PERFORMED
Barbiturates: NOT DETECTED
Benzodiazepines: NOT DETECTED
Cocaine: POSITIVE — AB
Tetrahydrocannabinol: NOT DETECTED

## 2012-07-03 LAB — ETHANOL: Alcohol, Ethyl (B): 243 mg/dL — ABNORMAL HIGH (ref 0–11)

## 2012-07-03 LAB — URINE MICROSCOPIC-ADD ON

## 2012-07-03 LAB — ACETAMINOPHEN LEVEL: Acetaminophen (Tylenol), Serum: <15 ug/mL (ref 10–30)

## 2012-07-03 MED ORDER — FOLIC ACID 1 MG PO TABS
1.0000 mg | ORAL_TABLET | Freq: Every day | ORAL | Status: DC
  Administered 2012-07-03 – 2012-07-04 (×2): 1 mg via ORAL
  Filled 2012-07-03 (×2): qty 1

## 2012-07-03 MED ORDER — ONDANSETRON HCL 4 MG PO TABS: 4.0000 mg | ORAL_TABLET | Freq: Three times a day (TID) | ORAL | Status: DC | PRN

## 2012-07-03 MED ORDER — IBUPROFEN 200 MG PO TABS
600.0000 mg | ORAL_TABLET | Freq: Three times a day (TID) | ORAL | Status: DC | PRN
  Administered 2012-07-03: 600 mg via ORAL
  Filled 2012-07-03: qty 3

## 2012-07-03 MED ORDER — LORAZEPAM 1 MG PO TABS: 1.0000 mg | ORAL_TABLET | Freq: Four times a day (QID) | ORAL | Status: DC | PRN

## 2012-07-03 MED ORDER — VITAMIN B-1 100 MG PO TABS
100.0000 mg | ORAL_TABLET | Freq: Every day | ORAL | Status: DC
  Administered 2012-07-03 – 2012-07-04 (×2): 100 mg via ORAL
  Filled 2012-07-03 (×2): qty 1

## 2012-07-03 MED ORDER — RISPERIDONE 0.5 MG PO TABS
0.5000 mg | ORAL_TABLET | Freq: Every day | ORAL | Status: DC
  Administered 2012-07-03 – 2012-07-04 (×2): 0.5 mg via ORAL
  Filled 2012-07-03 (×2): qty 1

## 2012-07-03 MED ORDER — ADULT MULTIVITAMIN W/MINERALS CH
1.0000 | ORAL_TABLET | Freq: Every day | ORAL | Status: DC
  Administered 2012-07-03 – 2012-07-04 (×2): 1 via ORAL
  Filled 2012-07-03 (×2): qty 1

## 2012-07-03 MED ORDER — ZOLPIDEM TARTRATE 5 MG PO TABS: 5.0000 mg | ORAL_TABLET | Freq: Every evening | ORAL | Status: DC | PRN

## 2012-07-03 MED ORDER — SODIUM CHLORIDE 0.9 % IV BOLUS (SEPSIS)
1000.0000 mL | Freq: Once | INTRAVENOUS | Status: AC
  Administered 2012-07-03: 1000 mL via INTRAVENOUS

## 2012-07-03 MED ORDER — METFORMIN HCL 500 MG PO TABS
1000.0000 mg | ORAL_TABLET | Freq: Two times a day (BID) | ORAL | Status: DC
  Administered 2012-07-04 (×2): 1000 mg via ORAL
  Filled 2012-07-03 (×4): qty 2

## 2012-07-03 MED ORDER — TRAZODONE HCL 50 MG PO TABS: 50.0000 mg | ORAL_TABLET | Freq: Every evening | ORAL | Status: DC | PRN

## 2012-07-03 MED ORDER — SODIUM CHLORIDE 0.9 % IV BOLUS (SEPSIS): 2000.0000 mL | Freq: Once | INTRAVENOUS | Status: DC

## 2012-07-03 MED ORDER — THIAMINE HCL 100 MG/ML IJ SOLN: 100.0000 mg | Freq: Every day | INTRAMUSCULAR | Status: DC

## 2012-07-03 MED ORDER — GLIMEPIRIDE 4 MG PO TABS
4.0000 mg | ORAL_TABLET | Freq: Every day | ORAL | Status: DC
  Administered 2012-07-04: 4 mg via ORAL
  Filled 2012-07-03 (×2): qty 1

## 2012-07-03 MED ORDER — LORAZEPAM 2 MG/ML IJ SOLN
1.0000 mg | Freq: Four times a day (QID) | INTRAMUSCULAR | Status: DC | PRN
  Administered 2012-07-03: 1 mg via INTRAVENOUS
  Filled 2012-07-03: qty 1

## 2012-07-03 MED ORDER — NICOTINE 21 MG/24HR TD PT24
21.0000 mg | MEDICATED_PATCH | Freq: Every day | TRANSDERMAL | Status: DC
  Filled 2012-07-03: qty 1

## 2012-07-03 MED ORDER — CITALOPRAM HYDROBROMIDE 40 MG PO TABS
40.0000 mg | ORAL_TABLET | Freq: Every day | ORAL | Status: DC
  Administered 2012-07-03 – 2012-07-04 (×2): 40 mg via ORAL
  Filled 2012-07-03 (×2): qty 1

## 2012-07-03 MED ORDER — ALUM & MAG HYDROXIDE-SIMETH 200-200-20 MG/5ML PO SUSP: 30.0000 mL | ORAL | Status: DC | PRN

## 2012-07-03 MED ORDER — INSULIN ASPART 100 UNIT/ML ~~LOC~~ SOLN
3.0000 [IU] | Freq: Two times a day (BID) | SUBCUTANEOUS | Status: DC
  Administered 2012-07-03 – 2012-07-04 (×3): 3 [IU] via SUBCUTANEOUS
  Filled 2012-07-03 (×3): qty 1
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
## 2012-07-03 NOTE — ED Notes (Addendum)
Pt biting at restraints and becoming increasingly agitated, will administer ativan as ordered.

## 2012-07-03 NOTE — ED Notes (Signed)
Per EMS pt walked into [facility], pt doesn't speak english very well so not for sure what is going on with pt. Pt very lethargic, Pt CBG per EMS 436. Pt has 20g left AC and given NS in route by EMS.  Per EMS pt was sinus tach on monitor.  Pt's son's phone number [phone] who speaks [place]

## 2012-07-03 NOTE — ED Notes (Signed)
Pt attempted to hang himself in bathroom with his belt by wrapping it around his neck and the handrail.  [employer] assisted pt back to his room.  When pt was instructed to get undressed and change into blue scrubs pt attempted to ram his head into the porcelain sink, but was stopped by [employer].
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
## 2012-07-03 NOTE — ED Notes (Signed)
Respiratory Therapist made aware of venous blood gas.

## 2012-07-04 ENCOUNTER — Encounter (HOSPITAL_COMMUNITY): Payer: Self-pay | Admitting: Registered Nurse

## 2012-07-04 ENCOUNTER — Emergency Department (HOSPITAL_COMMUNITY): Payer: No Typology Code available for payment source

## 2012-07-04 ENCOUNTER — Inpatient Hospital Stay (HOSPITAL_COMMUNITY)
Admission: AD | Admit: 2012-07-04 | Discharge: 2012-07-11 | DRG: 885 | Disposition: A | Discharge status: Home / Self Care | Payer: Federal, State, Local not specified - Other | Source: Intra-hospital | Attending: Psychiatry | Admitting: Psychiatry

## 2012-07-04 DIAGNOSIS — R45851 Suicidal ideations: Secondary | ICD-10-CM

## 2012-07-04 DIAGNOSIS — F323 Major depressive disorder, single episode, severe with psychotic features: Secondary | ICD-10-CM

## 2012-07-04 DIAGNOSIS — F329 Major depressive disorder, single episode, unspecified: Principal | ICD-10-CM | POA: Diagnosis present

## 2012-07-04 DIAGNOSIS — Z79899 Other long term (current) drug therapy: Secondary | ICD-10-CM

## 2012-07-04 DIAGNOSIS — R443 Hallucinations, unspecified: Secondary | ICD-10-CM

## 2012-07-04 DIAGNOSIS — F1910 Other psychoactive substance abuse, uncomplicated: Secondary | ICD-10-CM

## 2012-07-04 DIAGNOSIS — K219 Gastro-esophageal reflux disease without esophagitis: Secondary | ICD-10-CM | POA: Diagnosis present

## 2012-07-04 DIAGNOSIS — F1020 Alcohol dependence, uncomplicated: Secondary | ICD-10-CM | POA: Diagnosis present

## 2012-07-04 DIAGNOSIS — E119 Type 2 diabetes mellitus without complications: Secondary | ICD-10-CM | POA: Diagnosis present

## 2012-07-04 DIAGNOSIS — R51 Headache: Secondary | ICD-10-CM

## 2012-07-04 DIAGNOSIS — E118 Type 2 diabetes mellitus with unspecified complications: Secondary | ICD-10-CM

## 2012-07-04 LAB — GLUCOSE, CAPILLARY: Glucose-Capillary: 144 mg/dL — ABNORMAL HIGH (ref 70–99)

## 2012-07-04 MED ORDER — HYDROXYZINE HCL 25 MG PO TABS: 25.0000 mg | ORAL_TABLET | Freq: Four times a day (QID) | ORAL | Status: AC | PRN

## 2012-07-04 MED ORDER — CITALOPRAM HYDROBROMIDE 40 MG PO TABS
40.0000 mg | ORAL_TABLET | Freq: Every day | ORAL | Status: DC
  Administered 2012-07-05 – 2012-07-11 (×7): 40 mg via ORAL
  Filled 2012-07-04 (×9): qty 1

## 2012-07-04 MED ORDER — MAGNESIUM HYDROXIDE 400 MG/5ML PO SUSP: 30.0000 mL | Freq: Every day | ORAL | Status: DC | PRN

## 2012-07-04 MED ORDER — CHLORDIAZEPOXIDE HCL 25 MG PO CAPS: 25.0000 mg | ORAL_CAPSULE | Freq: Four times a day (QID) | ORAL | Status: AC | PRN

## 2012-07-04 MED ORDER — ONDANSETRON 4 MG PO TBDP
4.0000 mg | ORAL_TABLET | Freq: Four times a day (QID) | ORAL | Status: AC | PRN
  Administered 2012-07-07: 4 mg via ORAL
  Filled 2012-07-04: qty 1

## 2012-07-04 MED ORDER — CHLORDIAZEPOXIDE HCL 25 MG PO CAPS
25.0000 mg | ORAL_CAPSULE | Freq: Three times a day (TID) | ORAL | Status: AC
  Administered 2012-07-06 – 2012-07-07 (×3): 25 mg via ORAL
  Filled 2012-07-04 (×4): qty 1

## 2012-07-04 MED ORDER — METFORMIN HCL 500 MG PO TABS
1000.0000 mg | ORAL_TABLET | Freq: Two times a day (BID) | ORAL | Status: DC
  Administered 2012-07-05 – 2012-07-11 (×13): 1000 mg via ORAL
  Filled 2012-07-04 (×16): qty 2
  Filled 2012-07-04: qty 1
  Filled 2012-07-04: qty 2

## 2012-07-04 MED ORDER — INSULIN ASPART 100 UNIT/ML ~~LOC~~ SOLN: 3.0000 [IU] | Freq: Two times a day (BID) | SUBCUTANEOUS | Status: DC

## 2012-07-04 MED ORDER — THIAMINE HCL 100 MG/ML IJ SOLN: 100.0000 mg | Freq: Once | INTRAMUSCULAR | Status: DC

## 2012-07-04 MED ORDER — TRAZODONE HCL 50 MG PO TABS
50.0000 mg | ORAL_TABLET | Freq: Every evening | ORAL | Status: DC | PRN
  Filled 2012-07-04: qty 1

## 2012-07-04 MED ORDER — ACETAMINOPHEN 325 MG PO TABS
650.0000 mg | ORAL_TABLET | Freq: Four times a day (QID) | ORAL | Status: DC | PRN
  Administered 2012-07-05 – 2012-07-06 (×3): 650 mg via ORAL

## 2012-07-04 MED ORDER — CHLORDIAZEPOXIDE HCL 25 MG PO CAPS
25.0000 mg | ORAL_CAPSULE | Freq: Four times a day (QID) | ORAL | Status: AC
  Administered 2012-07-05 – 2012-07-06 (×6): 25 mg via ORAL
  Filled 2012-07-04 (×5): qty 1

## 2012-07-04 MED ORDER — RISPERIDONE 0.5 MG PO TABS
0.5000 mg | ORAL_TABLET | Freq: Every day | ORAL | Status: DC
  Administered 2012-07-05: 0.5 mg via ORAL
  Filled 2012-07-04 (×2): qty 1

## 2012-07-04 MED ORDER — CHLORDIAZEPOXIDE HCL 25 MG PO CAPS
25.0000 mg | ORAL_CAPSULE | Freq: Every day | ORAL | Status: AC
  Administered 2012-07-09: 25 mg via ORAL
  Filled 2012-07-04: qty 1

## 2012-07-04 MED ORDER — ADULT MULTIVITAMIN W/MINERALS CH
1.0000 | ORAL_TABLET | Freq: Every day | ORAL | Status: DC
  Administered 2012-07-05 – 2012-07-11 (×7): 1 via ORAL
  Filled 2012-07-04 (×9): qty 1

## 2012-07-04 MED ORDER — PROCHLORPERAZINE EDISYLATE 5 MG/ML IJ SOLN
10.0000 mg | Freq: Four times a day (QID) | INTRAMUSCULAR | Status: DC | PRN
  Administered 2012-07-04: 10 mg via INTRAVENOUS
  Filled 2012-07-04: qty 2

## 2012-07-04 MED ORDER — LOPERAMIDE HCL 2 MG PO CAPS: 2.0000 mg | ORAL_CAPSULE | ORAL | Status: AC | PRN

## 2012-07-04 MED ORDER — CHLORDIAZEPOXIDE HCL 25 MG PO CAPS
25.0000 mg | ORAL_CAPSULE | ORAL | Status: AC
  Administered 2012-07-07 – 2012-07-08 (×2): 25 mg via ORAL
  Filled 2012-07-04 (×2): qty 1

## 2012-07-04 MED ORDER — VITAMIN B-1 100 MG PO TABS
100.0000 mg | ORAL_TABLET | Freq: Every day | ORAL | Status: DC
  Administered 2012-07-05 – 2012-07-11 (×7): 100 mg via ORAL
  Filled 2012-07-04 (×9): qty 1

## 2012-07-04 MED ORDER — CHLORDIAZEPOXIDE HCL 25 MG PO CAPS: 25.0000 mg | ORAL_CAPSULE | Freq: Once | ORAL | Status: DC

## 2012-07-04 MED ORDER — ALUM & MAG HYDROXIDE-SIMETH 200-200-20 MG/5ML PO SUSP: 30.0000 mL | ORAL | Status: DC | PRN

## 2012-07-04 MED ORDER — GLIMEPIRIDE 4 MG PO TABS
4.0000 mg | ORAL_TABLET | Freq: Every day | ORAL | Status: DC
  Administered 2012-07-05 – 2012-07-11 (×7): 4 mg via ORAL
  Filled 2012-07-04 (×7): qty 1
  Filled 2012-07-04: qty 2
  Filled 2012-07-04 (×2): qty 1

## 2012-07-04 NOTE — ED Notes (Signed)
Blood sugar 183

## 2012-07-04 NOTE — ED Notes (Signed)
Mitchell [name], ACT team at bedside speaking with patient.

## 2012-07-04 NOTE — Progress Notes (Signed)
Monica from language resources interpretted this afternoon for assessment and is familiar with patient from previous [facility] visits. Pt also very familiar with interpreter. Please utilize language resources through the CSW department for interpreting services while at [facility].   Catha Gosselin, LCSWA  [phone] 07/04/2012 1550pm

## 2012-07-04 NOTE — [facility] Counselor (Signed)
Pt has been accepted to [facility] for treatment, attending is Dr. Thedore Mins [phone].  This Clinical research associate contacted [employer] to assist with completion of paperwork.

## 2012-07-04 NOTE — ED Provider Notes (Signed)
Patient with headaches. Has been stable since earlier. Head CT is reassuring. Doubt fracture. Patient given Compazine to treat his headache. Sugars have been stable and he is on insulin. He is pending placement by the ACT team. He appears to medically cleared at this time  Juliet Rude. Rubin Payor, MD 07/04/12 1620

## 2012-07-04 NOTE — ED Notes (Signed)
EDP made aware that pt has new onset of HA and dizziness. EDP to evaluate.

## 2012-07-04 NOTE — Progress Notes (Signed)
[facility] ED CM consulted by ED SW about pt concern of headache. Reviewed EPIC notes, imaging and ED clinical summary Noted elevated cbg at 260 with metformin, novolog 3 units bid and amaryl qd and CIWA protocol medicines. HR decrease from 101 to 84 sbp decrease from 147 to 125 Last etoh level 243 and drug screen positive for cocaine. CT negative for acute findings. 1327 CM spoke with Dr Rubin Payor about voiced headache concerns States pt has been given compazine, will review CT and request another cbg check.

## 2012-07-04 NOTE — ED Notes (Signed)
Bilateral soft wrist restraints were removed at this time. Pt remains calm and asleep. Will continue to monitor. Sitter at bedside.
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
## 2012-07-04 NOTE — Progress Notes (Signed)
[facility] has seen patient. 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

## 2012-07-04 NOTE — ED Notes (Addendum)
Charge talked to social work. Pt has bed pending at [facility] as long as all results come back normal.  Interpretor will come back at 1300 and social work will assess pt.
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
## 2012-07-04 NOTE — Progress Notes (Signed)
CSW to complete assessment at 1pm when interpreter arrives. Pt has been accepted to [facility] pending further medical evaluation.   Catha Gosselin, LCSWA  [phone] .07/04/2012 1143am

## 2012-07-04 NOTE — ED Notes (Signed)
Bilateral soft ankle restraints removed at this time for trial. Pt calm and resting. Will continue to re-evaluate for criteria for removal.

## 2012-07-05 ENCOUNTER — Ambulatory Visit: Payer: Self-pay | Admitting: Family Medicine

## 2012-07-05 ENCOUNTER — Encounter (HOSPITAL_COMMUNITY): Payer: Self-pay | Admitting: Behavioral Health

## 2012-07-05 DIAGNOSIS — F329 Major depressive disorder, single episode, unspecified: Principal | ICD-10-CM

## 2012-07-05 LAB — GLUCOSE, CAPILLARY
Glucose-Capillary: 173 mg/dL — ABNORMAL HIGH (ref 70–99)
Glucose-Capillary: 186 mg/dL — ABNORMAL HIGH (ref 70–99)
Glucose-Capillary: 192 mg/dL — ABNORMAL HIGH (ref 70–99)

## 2012-07-05 LAB — HEMOGLOBIN A1C: Mean Plasma Glucose: 180 mg/dL — ABNORMAL HIGH (ref <117)

## 2012-07-05 MED ORDER — INSULIN ASPART 100 UNIT/ML ~~LOC~~ SOLN
0.0000 [IU] | Freq: Every day | SUBCUTANEOUS | Status: DC
  Administered 2012-07-06 – 2012-07-07 (×2): 4 [IU] via SUBCUTANEOUS
  Administered 2012-07-08: 3 [IU] via SUBCUTANEOUS
  Administered 2012-07-09: 4 [IU] via SUBCUTANEOUS
  Administered 2012-07-10: 3 [IU] via SUBCUTANEOUS

## 2012-07-05 MED ORDER — TRAZODONE HCL 50 MG PO TABS: 50.0000 mg | ORAL_TABLET | Freq: Every evening | ORAL | Status: DC | PRN

## 2012-07-05 MED ORDER — INSULIN ASPART 100 UNIT/ML ~~LOC~~ SOLN
0.0000 [IU] | Freq: Three times a day (TID) | SUBCUTANEOUS | Status: DC
  Administered 2012-07-05 – 2012-07-06 (×4): 3 [IU] via SUBCUTANEOUS
  Administered 2012-07-06: 5 [IU] via SUBCUTANEOUS
  Administered 2012-07-06: 3 [IU] via SUBCUTANEOUS
  Administered 2012-07-07: 5 [IU] via SUBCUTANEOUS
  Administered 2012-07-07 (×2): 8 [IU] via SUBCUTANEOUS
  Administered 2012-07-08: 11 [IU] via SUBCUTANEOUS
  Administered 2012-07-08 (×2): 5 [IU] via SUBCUTANEOUS
  Administered 2012-07-09: 15 [IU] via SUBCUTANEOUS
  Administered 2012-07-09: 5 [IU] via SUBCUTANEOUS
  Administered 2012-07-09: 15 [IU] via SUBCUTANEOUS
  Administered 2012-07-10: 5 [IU] via SUBCUTANEOUS
  Administered 2012-07-10: 11 [IU] via SUBCUTANEOUS
  Administered 2012-07-11: 5 [IU] via SUBCUTANEOUS
  Administered 2012-07-11: 8 [IU] via SUBCUTANEOUS

## 2012-07-05 MED ORDER — ADULT MULTIVITAMIN W/MINERALS CH
1.0000 | ORAL_TABLET | Freq: Every day | ORAL | Status: DC
  Filled 2012-07-05 (×3): qty 1

## 2012-07-05 MED ORDER — TRAZODONE HCL 50 MG PO TABS
50.0000 mg | ORAL_TABLET | Freq: Every evening | ORAL | Status: DC | PRN
  Administered 2012-07-04 – 2012-07-10 (×8): 50 mg via ORAL
  Filled 2012-07-05 (×16): qty 1

## 2012-07-05 MED ORDER — PANTOPRAZOLE SODIUM 40 MG PO TBEC
40.0000 mg | DELAYED_RELEASE_TABLET | Freq: Every day | ORAL | Status: DC
  Administered 2012-07-05 – 2012-07-11 (×7): 40 mg via ORAL
  Filled 2012-07-05 (×9): qty 1

## 2012-07-05 NOTE — [facility] Group Notes (Signed)
[facility] [employer] Group Therapy  07/05/2012 , 1:33 PM    Type of Therapy:  [employer] Presentation  Participation Level:  Active  Participation Quality:  Attentive  Affect:  Blunted  Cognitive:  Oriented  Insight:  Limited  Engagement in Therapy:  Engaged  Modes of Intervention:  Discussion, Education and Socialization  Summary of Progress/Problems:  Onalee Hua from [employer] came to present his recovery story and play the guitar.  Sat and listened during the group, said nothing and walked out while the presenter was playing guitar.  Daryel Gerald B 07/05/2012 , 1:33 PM

## 2012-07-05 NOTE — Progress Notes (Signed)
Recreation Therapy Notes  Date: 06.11.2014 Time: 9:30am Location: [address] Dayroom      Group Topic/Focus: Leisure Education  Participation Level: Did not attend.  Marykay Lex Catalia Massett, LRT/CTRS  Jearl Klinefelter 07/05/2012 12:29 PM
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
## 2012-07-05 NOTE — Progress Notes (Signed)
Admission admission note: 37 year old male who presents voluntarily for treatment of auditory hallucinations suicidal ideations, substance abuse, and depression.  Patient admission done through interpreter phone line. Patient spanish speaking and speaks some english.  Patient states he went to the hospital because he had a headache that was so bad.  Patient states he was hearing voices to tell him to kill himself.  Patient currently denies SI/HI but states he is hearing the voices telling him to kill himself.  Patient verbally contracts for safety.  Patient states he has had problems with drinking alcohol in the past.  Patient states he was sober 2 months but had his first drink on 07/03/2012.  Patient states he only drank 2 beers.  Patient states he could not deal with the voices and the extreme headache.  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  Patient has poor circulation to right lower extremity.  Patient right foot discolored.  Patient walks with a limp and states he usually wears a boot on that foot but states he left it in his car.  Patient has medical history, ETOH abuse, depression, GERD, DM, past suicide attempt.  Patient has surgical history toe amputation, skin graft, leg sx.  Consents obtained, fall safety plan initiated pt verbalized understanding.  Foods and fluids offered patient accepted fluids.  Lockers secured in locker #9.  Patient offered no additional questions or concerns

## 2012-07-05 NOTE — [facility] Suicide Risk Assessment (Signed)
Suicide Risk Assessment  Admission Assessment     Nursing information obtained from:  Patient Demographic factors:  Male;Low socioeconomic status Current Mental Status:  Suicidal ideation indicated by patient;Self-harm behaviors;Self-harm thoughts;Suicide plan Loss Factors:    Historical Factors:  Prior suicide attempts;Family history of mental illness or substance abuse Risk Reduction Factors:  Sense of responsibility to family  CLINICAL FACTORS:   Depression:   Anhedonia Comorbid alcohol abuse/dependence Delusional Hopelessness Impulsivity Insomnia Alcohol/Substance Abuse/Dependencies Currently Psychotic Previous Psychiatric Diagnoses and Treatments  COGNITIVE FEATURES THAT CONTRIBUTE TO RISK:  Closed-mindedness Polarized thinking    SUICIDE RISK:   Moderate:  Frequent suicidal ideation with limited intensity, and duration, some specificity in terms of plans, no associated intent, good self-control, limited dysphoria/symptomatology, some risk factors present, and identifiable protective factors, including available and accessible social support.  PLAN OF CARE:1. Admit for crisis management and stabilization. 2. Medication management to reduce current symptoms to base line and improve the     patient's overall level of functioning 3. Treat health problems as indicated. 4. Develop treatment plan to decrease risk of relapse upon discharge and the need for     readmission. 5. Psycho-social education regarding relapse prevention and self care. 6. Health care follow up as needed for medical problems. 7. Restart home medications where appropriate.   I certify that inpatient services furnished can reasonably be expected to improve the patient's condition.  Thedore Mins    MD 07/05/2012, 11:35 AM

## 2012-07-05 NOTE — Progress Notes (Signed)
Patient resting quietly with eyes closed. Respirations even and unlabored, no distress noted. Q 15 minute check continues as ordered to maintain safety. 

## 2012-07-05 NOTE — Progress Notes (Signed)
The focus of this group is to help patients review their daily goal of treatment and discuss progress on daily workbooks. Pt attended the evening group session with his translator and responded to all discussion prompts from the Writer. Pt reported having a good day because his headache was gone and that he was no longer hearing the voices from previous days. Pt reported his only need this evening was to get a good night's sleep. Pt also discussed watching professional soccer, which was one of his interests. Pt's affect during group was bright and he smiled when speaking/being spoken to.

## 2012-07-05 NOTE — Tx Team (Signed)
  Interdisciplinary Treatment Plan Update   Date Reviewed:  07/05/2012  Time Reviewed:  8:04 AM  Progress in Treatment:   Attending groups: Yes Participating in groups: No Taking medication as prescribed: Yes  Tolerating medication: Yes Family/Significant other contact made: No Patient understands diagnosis: Yes As evidenced by asking for help with pain in foot, substance abuse and depression Discussing patient identified problems/goals with staff: Yes  See iniital plan Medical problems stabilized or resolved: Yes Denies suicidal/homicidal ideation: Yes  In tx team Patient has not harmed self or others: Yes  For review of initial/current patient goals, please see plan of care.  Estimated Length of Stay:  3-5 days  Reason for Continuation of Hospitalization: Depression Medication stabilization Withdrawal symptoms  New Problems/Goals identified:  N/A  Discharge Plan or Barriers:   unknown  Additional Comments:This is a 89 year Hispanic male. Admitted to [facility] from the [facility] ED with complaints of headaches, auditory hallucinations and suicide attempt by hanging. His toxicology reports also indicated high levels of alcohol in his system. Patient is limited in his ability to speak english and will need a Hispanic interpreter to interact with staff. This is one of his numerous admissions to this hospital. He has been here for major depressive disorder, alcohol detox and this time for psychosis (Auditory hallucinations) and suicide attempt. He is currently receiving librium protocol for alcohol detox. He is also receiving medications management and monitoring for his other previously existing medical issues. He currently denies any SIHI, AVH.   Attendees:  Signature: Thedore Mins, MD 07/05/2012 8:04 AM   Signature: Richelle Ito, LCSW 07/05/2012 8:04 AM  Signature: Verne Spurr, PA 07/05/2012 8:04 AM  Signature:  07/05/2012 8:04 AM  Signature: Liborio Nixon, RN 07/05/2012 8:04  AM  Signature:  07/05/2012 8:04 AM  Signature:   07/05/2012 8:04 AM  Signature:    Signature:    Signature:    Signature:    Signature:    Signature:      Scribe for Treatment Team:   Richelle Ito, LCSW  07/05/2012 8:04 AM

## 2012-07-05 NOTE — B[facility] Group Notes (Signed)
[facility] LCSW Aftercare Discharge Planning Group Note   07/05/2012 8:04 AM  Participation Quality:  Did not attend   [place]

## 2012-07-05 NOTE — Progress Notes (Signed)
Patient ID: Mitchell [name], male   DOB: [birth year], 37 y.o.   MRN: [id number] He has been in been most of the day sleeping but has been up for medication and meals. He has c/o a headache and was given tylenol that did help. He is able to understand a little [place].  He denies thoughts of wanting to harm self.

## 2012-07-05 NOTE — Tx Team (Addendum)
Initial Interdisciplinary Treatment Plan  PATIENT STRENGTHS: (choose at least two) Motivation for treatment/growth Supportive family/friends  PATIENT STRESSORS: Substance abuse Traumatic event   PROBLEM LIST: Problem List/Patient Goals Date to be addressed Date deferred Reason deferred Estimated date of resolution  Suicidal ideations plan to hang himself 07/04/2012     Substance Abuse ETOH, cocaine 07/04/2012     Depression 07/04/2012     Auditory hallucinations 07/04/2012                                    DISCHARGE CRITERIA:  Ability to meet basic life and health needs Improved stabilization in mood, thinking, and/or behavior Motivation to continue treatment in a less acute level of care Need for constant or close observation no longer present Reduction of life-threatening or endangering symptoms to within safe limits Safe-care adequate arrangements made  PRELIMINARY DISCHARGE PLAN: Attend aftercare/continuing care group Outpatient therapy Return to previous work or school arrangements  PATIENT/FAMIILY INVOLVEMENT: This treatment plan has been presented to and reviewed with the patient, Mitchell [name].  The patient and family have been given the opportunity to ask questions and make suggestions.  [name] 07/05/2012, 3:35 AM

## 2012-07-06 LAB — GLUCOSE, CAPILLARY
Glucose-Capillary: 171 mg/dL — ABNORMAL HIGH (ref 70–99)
Glucose-Capillary: 193 mg/dL — ABNORMAL HIGH (ref 70–99)

## 2012-07-06 MED ORDER — NAPROXEN 375 MG PO TABS
375.0000 mg | ORAL_TABLET | Freq: Two times a day (BID) | ORAL | Status: DC
  Administered 2012-07-06 – 2012-07-07 (×2): 375 mg via ORAL
  Filled 2012-07-06 (×4): qty 1

## 2012-07-06 MED ORDER — RISPERIDONE 1 MG PO TABS
1.0000 mg | ORAL_TABLET | Freq: Every day | ORAL | Status: DC
  Administered 2012-07-06 – 2012-07-10 (×5): 1 mg via ORAL
  Filled 2012-07-06 (×7): qty 1

## 2012-07-06 MED ORDER — NAPROXEN 375 MG PO TABS
375.0000 mg | ORAL_TABLET | Freq: Once | ORAL | Status: AC
  Administered 2012-07-06: 375 mg via ORAL
  Filled 2012-07-06: qty 1

## 2012-07-06 NOTE — B[facility] Group Notes (Signed)
[facility] Group Notes:  (Counselor/Nursing/MHT/Case Management/Adjunct)  07/06/2012 1:15PM  Type of Therapy:  Group Therapy  Participation Level:  Active  Participation Quality:  Appropriate  Affect:  Flat  Cognitive:  Oriented  Insight:  Improving  Engagement in Group:  Limited  Engagement in Therapy:  Limited  Modes of Intervention:  Discussion, Exploration and Socialization  Summary of Progress/Problems: The topic for group was balance in life.  Pt participated in the discussion about when their life was in balance and out of balance and how this feels.  Pt discussed ways to get back in balance and short term goals they can work on to get where they want to be. Mitchell [name] states he is unbalanced because he is away from his sons.  He clarifies that it is possible for him to get balanced because his sons no longer live with him, so he has learned how to find balance without them.  He is also unbalanced because he recently relapsed, and he is having a hard time forgiving himself for that.  Other group members encouraged him to forgive self.   Mitchell [name] 07/06/2012 2:23 PM

## 2012-07-06 NOTE — Progress Notes (Signed)
Patient ID: Mitchell [name], male   DOB: 12-16-1975, 37 y.o.   MRN: [id number]  D:  Pt +ve  For passive SI, but contracts for safety. Pt denies HI/AVH. Pt is pleasant and cooperative.  Spoke to interpreter with pt earlier in the evening, to clarify any communication issues from earlier in the evening. Pt states his head pain from earlier is better. Pt CBG 301 tonight and pt  got 4 units insulin per sliding scale  A: Pt was offered support and encouragement. Pt was given scheduled medications. Pt was encourage to attend groups. Q 15 minute checks were done for safety.    R:Pt attends groups and interacts well with peers and staff. Pt is taking medication. Pt has no complaints at this time.Pt receptive to treatment and safety maintained on unit.

## 2012-07-06 NOTE — Progress Notes (Signed)
Pt attended karaoke group this evening. Pt participated with peers. 

## 2012-07-06 NOTE — Progress Notes (Signed)
[facility] MD Progress Note  07/06/2012 10:30 AM Mitchell [name]  MRN:  [id number] Subjective: "I have headache and blurry vision". Objective: Patient speaks limited english and history is obtained from him through a language [employer]). Patient complaint of having headache around the nape of his head and the occipital area. He also endorsed auditory hallucinations, insomnia, nausea, anxiety and depressive symptoms. However, he denies suicidal thoughts today. He is on Librium taper due to Alcohol intoxication and withdrawal.  Diagnosis:  Axis I: MDD-Recurrent episode severe with psychosis                                Alcohol dependence   ADL's:  Intact  Sleep: Fair  Appetite:  Fair  Suicidal Ideation: yes Plan:  denies Intent:  denies Means:  denies Homicidal Ideation:  Plan:  denies Intent:  denies Means:  denies AEB (as evidenced by):  Psychiatric Specialty Exam: Review of Systems  Constitutional: Positive for malaise/fatigue.  Eyes: Positive for blurred vision.  Respiratory: Negative.   Cardiovascular: Negative.   Gastrointestinal: Positive for nausea.  Genitourinary: Negative.   Musculoskeletal: Positive for myalgias.  Skin: Negative.   Neurological: Positive for tremors and headaches.  Endo/Heme/Allergies: Negative.   Psychiatric/Behavioral: Positive for depression and hallucinations. The patient is nervous/anxious.     Blood pressure 124/83, pulse 70, temperature 96.8 F (36 C), temperature source Oral, resp. rate 17, height 5' 2.6" (1.59 m), weight 77.111 kg (170 lb).Body mass index is 30.5 kg/(m^2).  General Appearance: Fairly Groomed  Patent attorney::  Minimal  Speech:  Clear and Coherent  Volume:  Decreased  Mood:  Anxious and Depressed  Affect:  Blunt  Thought Process:  Goal Directed  Orientation:  Full (Time, Place, and Person)  Thought Content:  Hallucinations: Auditory  Suicidal Thoughts:  Yes.  without intent/plan  Homicidal Thoughts:  No   Memory:  Immediate;   Fair Recent;   Fair Remote;   Fair  Judgement:  Poor  Insight:  Lacking  Psychomotor Activity:  Decreased  Concentration:  Fair  Recall:  Fair  Akathisia:  No  Handed:  Right  AIMS (if indicated):     Assets:  Communication Skills Desire for Improvement  Sleep:  Number of Hours: 6.5   Current Medications: Current Facility-Administered Medications  Medication Dose Route Frequency Provider Last Rate Last Dose  . alum & mag hydroxide-simeth (MAALOX/MYLANTA) 200-200-20 MG/5ML suspension 30 mL  30 mL Oral Q4H PRN Shuvon Rankin, NP      . chlordiazePOXIDE (LIBRIUM) capsule 25 mg  25 mg Oral Q6H PRN Shuvon Rankin, NP      . chlordiazePOXIDE (LIBRIUM) capsule 25 mg  25 mg Oral Once Shuvon Rankin, NP      . chlordiazePOXIDE (LIBRIUM) capsule 25 mg  25 mg Oral QID Shuvon Rankin, NP   25 mg at 07/06/12 0803   Followed by  . chlordiazePOXIDE (LIBRIUM) capsule 25 mg  25 mg Oral TID Shuvon Rankin, NP       Followed by  . [START ON 07/07/2012] chlordiazePOXIDE (LIBRIUM) capsule 25 mg  25 mg Oral BH-qamhs Shuvon Rankin, NP       Followed by  . [START ON 07/09/2012] chlordiazePOXIDE (LIBRIUM) capsule 25 mg  25 mg Oral Daily Shuvon Rankin, NP      . citalopram (CELEXA) tablet 40 mg  40 mg Oral Daily Shuvon Rankin, NP   40 mg at 07/06/12 0803  . glimepiride (AMARYL)  tablet 4 mg  4 mg Oral QAC breakfast Shuvon Rankin, NP   4 mg at 07/06/12 1191  . hydrOXYzine (ATARAX/VISTARIL) tablet 25 mg  25 mg Oral Q6H PRN Shuvon Rankin, NP      . insulin aspart (novoLOG) injection 0-15 Units  0-15 Units Subcutaneous TID WC Kerry Hough, PA-C   3 Units at 07/06/12 [phone]  . insulin aspart (novoLOG) injection 0-5 Units  0-5 Units Subcutaneous QHS Spencer E Simon, PA-C      . loperamide (IMODIUM) capsule 2-4 mg  2-4 mg Oral PRN Shuvon Rankin, NP      . magnesium hydroxide (MILK OF MAGNESIA) suspension 30 mL  30 mL Oral Daily PRN Shuvon Rankin, NP      . metFORMIN (GLUCOPHAGE) tablet 1,000 mg   1,000 mg Oral BID WC Shuvon Rankin, NP   1,000 mg at 07/06/12 0803  . multivitamin with minerals tablet 1 tablet  1 tablet Oral Daily Shuvon Rankin, NP   1 tablet at 07/06/12 0803  . naproxen (NAPROSYN) tablet 375 mg  375 mg Oral BID WC Maritza Hosterman      . ondansetron (ZOFRAN-ODT) disintegrating tablet 4 mg  4 mg Oral Q6H PRN Shuvon Rankin, NP      . pantoprazole (PROTONIX) EC tablet 40 mg  40 mg Oral Daily Sanjuana Kava, NP   40 mg at 07/06/12 0803  . risperiDONE (RISPERDAL) tablet 1 mg  1 mg Oral QHS Camellia Popescu      . thiamine (B-1) injection 100 mg  100 mg Intramuscular Once Shuvon Rankin, NP      . thiamine (VITAMIN B-1) tablet 100 mg  100 mg Oral Daily Shuvon Rankin, NP   100 mg at 07/06/12 0803  . traZODone (DESYREL) tablet 50 mg  50 mg Oral QHS,MR X 1 Kerry Hough, PA-C   50 mg at 07/05/12 2116  . traZODone (DESYREL) tablet 50 mg  50 mg Oral QHS PRN,MR X 1 Sanjuana Kava, NP        Lab Results:  Results for orders placed during the hospital encounter of 07/04/12 (from the past 48 hour(s))  GLUCOSE, CAPILLARY     Status: Abnormal   Collection Time    07/04/12 11:05 PM      Result Value Range   Glucose-Capillary 144 (*) 70 - 99 mg/dL  HEMOGLOBIN N5A     Status: Abnormal   Collection Time    07/05/12  6:40 AM      Result Value Range   Hemoglobin A1C 7.9 (*) <5.7 %   Comment: (NOTE)                                                                               According to the ADA Clinical Practice Recommendations for 2011, when     HbA1c is used as a screening test:      >=6.5%   Diagnostic of Diabetes Mellitus               (if abnormal result is confirmed)     5.7-6.4%   Increased risk of developing Diabetes Mellitus     References:Diagnosis and Classification of Diabetes Mellitus,Diabetes  AOZH,0865,78(IONGE 1):S62-S69 and Standards of Medical Care in             Diabetes - 2011,Diabetes Care,2011,34 (Suppl 1):S11-S61.   Mean Plasma Glucose 180 (*) <117 mg/dL   GLUCOSE, CAPILLARY     Status: Abnormal   Collection Time    07/05/12  6:41 AM      Result Value Range   Glucose-Capillary 173 (*) 70 - 99 mg/dL  GLUCOSE, CAPILLARY     Status: Abnormal   Collection Time    07/05/12 11:57 AM      Result Value Range   Glucose-Capillary 186 (*) 70 - 99 mg/dL   Comment 1 Notify RN    GLUCOSE, CAPILLARY     Status: Abnormal   Collection Time    07/05/12  5:10 PM      Result Value Range   Glucose-Capillary 172 (*) 70 - 99 mg/dL  GLUCOSE, CAPILLARY     Status: Abnormal   Collection Time    07/05/12  8:35 PM      Result Value Range   Glucose-Capillary 192 (*) 70 - 99 mg/dL   Comment 1 Notify RN    GLUCOSE, CAPILLARY     Status: Abnormal   Collection Time    07/06/12  6:07 AM      Result Value Range   Glucose-Capillary 171 (*) 70 - 99 mg/dL    Physical Findings: AIMS: Facial and Oral Movements Muscles of Facial Expression: None, normal Lips and Perioral Area: None, normal Jaw: None, normal Tongue: None, normal,Extremity Movements Upper (arms, wrists, hands, fingers): None, normal Lower (legs, knees, ankles, toes): None, normal, Trunk Movements Neck, shoulders, hips: None, normal, Overall Severity Severity of abnormal movements (highest score from questions above): None, normal Incapacitation due to abnormal movements: None, normal, Dental Status Current problems with teeth and/or dentures?: No Does patient usually wear dentures?: No  CIWA:  CIWA-Ar Total: 0 COWS:     Treatment Plan Summary: Daily contact with patient to assess and evaluate symptoms and progress in treatment Medication management  Plan:1. Admit for crisis management and stabilization. 2. Medication management to reduce current symptoms to base line and improve the     patient's overall level of functioning 3. Treat health problems as indicated. 4. Develop treatment plan to decrease risk of relapse upon discharge and the need for     readmission. 5. Psycho-social  education regarding relapse prevention and self care. 6. Health care follow up as needed for medical problems. 7. Restart home medications where appropriate. 8. Naprosyn 375mg  po BID with food for Headache  Medical Decision Making Problem Points:  Established problem, stable/improving (1), Review of last therapy session (1) and Review of psycho-social stressors (1) Data Points:  Order Aims Assessment (2) Review of medication regiment & side effects (2) Review of new medications or change in dosage (2)  I certify that inpatient services furnished can reasonably be expected to improve the patient's condition.   Harce Volden,MD 07/06/2012, 10:30 AM

## 2012-07-06 NOTE — BHH Counselor (Signed)
Adult Psychosocial Assessment Update Interdisciplinary Team  Previous [facility] Health Hospital admissions/discharges:  Admissions Discharges  Date: 04/28/12 Date:  Date:  04/15/11 Date:  Date: 02/09/11 Date:  Date: Date:  Date: Date:   Changes since the last Psychosocial Assessment (including adherence to outpatient mental health and/or substance abuse treatment, situational issues contributing to decompensation and/or relapse). Mitchell [name] states he has been sober since his last admission, with a relapse 2 days ago.  He initially denied use, but relented after being told results of BAC, though he still minimized.  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  Plans to return to stay with uncle at d/c.             Discharge Plan 1. Will you be returning to the same living situation after discharge?   Yes:X No:      If no, what is your plan?           2. Would you like a referral for services when you are discharged? Yes: X    If yes, for what services?  No:       Mental health       Summary and Recommendations (to be completed by the evaluator) Mitchell [name] is a 37 YO separated hispanic male who is here for alcohol and cocaine relapse as well as symptoms of psychosis and SI secondary to depression.  He can benefit from crises stabilization, medication management, therapeutic milieu and referral for services.                       Signature:  Ida Rogue 07/06/2012 3:42 PM

## 2012-07-06 NOTE — BHH Group Notes (Signed)
[facility] LCSW Aftercare Discharge Planning Group Note   07/06/2012 11:01 AM  Participation Quality:  Engaged  Mood/Affect:  Depressed  Depression Rating:  8  Anxiety Rating:  7  Thoughts of Suicide:  Yes Will you contract for safety?   Yes  Current AVH:  Yes  Plan for Discharge/Comments:  Ollivander states that prior to admission, his symptoms included hearing voices, blurry sight, his head hurt and SI.  All still exist.  Initially states he was not drinking, but when confronted states he had "2 beers" prior to admission.  Will return to uncle's home at d/c and follow up outpt.  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

## 2012-07-07 LAB — GLUCOSE, CAPILLARY
Glucose-Capillary: 272 mg/dL — ABNORMAL HIGH (ref 70–99)
Glucose-Capillary: 210 mg/dL — ABNORMAL HIGH (ref 70–99)

## 2012-07-07 MED ORDER — NAPROXEN 500 MG PO TABS
500.0000 mg | ORAL_TABLET | Freq: Two times a day (BID) | ORAL | Status: DC
  Administered 2012-07-07 – 2012-07-11 (×8): 500 mg via ORAL
  Filled 2012-07-07 (×13): qty 1

## 2012-07-07 MED ORDER — SUMATRIPTAN SUCCINATE 50 MG PO TABS
50.0000 mg | ORAL_TABLET | ORAL | Status: DC | PRN
  Administered 2012-07-07 – 2012-07-11 (×6): 50 mg via ORAL
  Filled 2012-07-07 (×7): qty 1

## 2012-07-07 NOTE — BHH Group Notes (Signed)
[facility] LCSW Aftercare Discharge Planning Group Note   07/07/2012 12:21 PM  Participation Quality:  Engaged  Mood/Affect:  Depressed  Depression Rating:  7  Anxiety Rating:  6  Thoughts of Suicide:  No Will you contract for safety?   NA  Current AVH:  No  Plan for Discharge/Comments:  C/O feeling bad physically-headache, nausea-and wanting more or different medication.    Transportation Means: uncle  Supports: uncle   [place], Mitchell [name]

## 2012-07-07 NOTE — Progress Notes (Signed)
Adult Psychoeducational Group Note  Date:  07/07/2012 Time:  10:07 PM  Group Topic/Focus:  Goals Group:   The focus of this group is to help patients establish daily goals to achieve during treatment and discuss how the patient can incorporate goal setting into their daily lives to aide in recovery.  Participation Level:  None  Participation Quality:  didnt attend  Affect:  didnt attend  Cognitive:  didnt attend  Insight: None  Engagement in Group:  didnt attend  Modes of Intervention:  didnt attend  Additional Comments:  Pt didn't attend group  Aldona Lento 07/07/2012, 10:07 PM

## 2012-07-07 NOTE — Progress Notes (Signed)
Recreation Therapy Notes  Date: 06.13.2014 Time: 9:30am Location: [address] Day Room      Group Topic/Focus: Leisure Education  Participation Level: None  Participation Quality: N/A  Affect: Euthymic  Cognitive: Oriented   Additional Comments: Patient attended group, but did not engage in activity.     Marykay Lex Kendalynn Wideman, LRT/CTRS  Domino Holten L 07/07/2012 3:15 PM

## 2012-07-07 NOTE — BHH Group Notes (Signed)
[facility] LCSW Group Therapy  07/07/2012 , 2:43 PM   Type of Therapy:  Group Therapy  Participation Level:  Active  Participation Quality:  Attentive  Affect:  Appropriate  Cognitive:  Alert  Insight:  Improving  Engagement in Therapy:  Engaged  Modes of Intervention:  Discussion, Exploration and Socialization  Summary of Progress/Problems: Today's group focused on the term Diagnosis.  Participants were asked to define the term, and then pronounce whether it is a negative, positive or neutral term.  [name] was attentive to the interpreter during the group.  He responded to questions when asked directly, and agreed with others that there is a stigma attached to mental health or substance abuse diagnosis.  He ended by stating that his challenge to himself today and this weekend is to love himslef, for only by loving himself can he love his family.   Mitchell [name] 07/07/2012 , 2:43 PM

## 2012-07-07 NOTE — Progress Notes (Signed)
Patient ID: Mitchell [name], male   DOB: 12-Nov-1975, 37 y.o.   MRN: [id number] [facility] MD Progress Note  07/07/2012 10:27 AM Mitchell [name]  MRN:  [id number] Subjective: "I feel like I want to throw up". Objective: Patient speaks limited english and history is obtained from him through a language resource person. Patient complaint of feeling nauseous, headache and hand tremors.He reports decreased auditory hallucinations, anxiety and depressive symptoms. He is on Librium taper for Alcohol intoxication and withdrawal.  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  Eyes: Positive for blurred vision.  Respiratory: Negative.   Cardiovascular: Negative.   Gastrointestinal: Positive for nausea.  Genitourinary: Negative.   Musculoskeletal: Positive for myalgias.  Skin: Negative.   Neurological: Positive for tremors and headaches.  Endo/Heme/Allergies: Negative.   Psychiatric/Behavioral: Positive for depression and hallucinations. The patient is nervous/anxious.     Blood pressure 110/69, pulse 86, temperature 94.5 F (34.7 C), temperature source Oral, resp. rate 18, height 5' 2.6" (1.59 m), weight 77.111 kg (170 lb).Body mass index is 30.5 kg/(m^2).  General Appearance: Fairly Groomed  Patent attorney::  Minimal  Speech:  Clear and Coherent  Volume:  Decreased  Mood:  Anxious and Depressed  Affect:  Blunt  Thought Process:  Goal Directed  Orientation:  Full (Time, Place, and Person)  Thought Content:  Hallucinations: Auditory  Suicidal Thoughts:  Yes.  without intent/plan  Homicidal Thoughts:  No  Memory:   Immediate;   Fair Recent;   Fair Remote;   Fair  Judgement:  Poor  Insight:  Lacking  Psychomotor Activity:  Decreased  Concentration:  Fair  Recall:  Fair  Akathisia:  No  Handed:  Right  AIMS (if indicated):     Assets:  Communication Skills Desire for Improvement  Sleep:  Number of Hours: 6.5   Current Medications: Current Facility-Administered Medications  Medication Dose Route Frequency Provider Last Rate Last Dose  . alum & mag hydroxide-simeth (MAALOX/MYLANTA) 200-200-20 MG/5ML suspension 30 mL  30 mL Oral Q4H PRN Shuvon Rankin, NP      . chlordiazePOXIDE (LIBRIUM) capsule 25 mg  25 mg Oral Q6H PRN Shuvon Rankin, NP      . chlordiazePOXIDE (LIBRIUM) capsule 25 mg  25 mg Oral Once Shuvon Rankin, NP      . chlordiazePOXIDE (LIBRIUM) capsule 25 mg  25 mg Oral TID Shuvon Rankin, NP   25 mg at 07/07/12 0820   Followed by  . chlordiazePOXIDE (LIBRIUM) capsule 25 mg  25 mg Oral BH-qamhs Shuvon Rankin, NP       Followed by  . [START ON 07/09/2012] chlordiazePOXIDE (LIBRIUM) capsule 25 mg  25 mg Oral Daily Shuvon Rankin, NP      . citalopram (CELEXA) tablet 40 mg  40 mg Oral Daily Shuvon Rankin, NP   40 mg at 07/07/12 0820  . glimepiride (AMARYL) tablet 4 mg  4 mg Oral QAC breakfast Shuvon Rankin, NP   4 mg at 07/07/12 0700  . hydrOXYzine (ATARAX/VISTARIL) tablet 25 mg  25 mg Oral Q6H PRN Shuvon Rankin, NP      . insulin aspart (novoLOG) injection 0-15 Units  0-15 Units Subcutaneous TID WC Kerry Hough, PA-C   8 Units at 07/07/12 0700  . insulin aspart (novoLOG) injection 0-5 Units  0-5 Units Subcutaneous QHS Kerry Hough, PA-C   4 Units at 07/06/12 2213  . loperamide (IMODIUM) capsule 2-4 mg  2-4 mg Oral PRN Shuvon Rankin, NP      . magnesium hydroxide (MILK OF MAGNESIA) suspension 30 mL  30 mL Oral Daily PRN Shuvon Rankin, NP      . metFORMIN (GLUCOPHAGE) tablet 1,000 mg  1,000 mg Oral BID WC Shuvon Rankin, NP   1,000 mg at 07/07/12 0821  . multivitamin with minerals tablet 1  tablet  1 tablet Oral Daily Shuvon Rankin, NP   1 tablet at 07/07/12 4098  . naproxen (NAPROSYN) tablet 500 mg  500 mg Oral BID WC Goldia Ligman      . ondansetron (ZOFRAN-ODT) disintegrating tablet 4 mg  4 mg Oral Q6H PRN Shuvon Rankin, NP   4 mg at 07/07/12 1002  . pantoprazole (PROTONIX) EC tablet 40 mg  40 mg Oral Daily Sanjuana Kava, NP   40 mg at 07/07/12 0820  . risperiDONE (RISPERDAL) tablet 1 mg  1 mg Oral QHS Anetta Olvera   1 mg at 07/06/12 2213  . SUMAtriptan (IMITREX) tablet 50 mg  50 mg Oral Q2H PRN Fransisca Kaufmann, NP      . thiamine (B-1) injection 100 mg  100 mg Intramuscular Once Shuvon Rankin, NP      . thiamine (VITAMIN B-1) tablet 100 mg  100 mg Oral Daily Shuvon Rankin, NP   100 mg at 07/07/12 1191  . traZODone (DESYREL) tablet 50 mg  50 mg Oral QHS,MR X 1 Kerry Hough, PA-C   50 mg at 07/06/12 2213  . traZODone (DESYREL) tablet 50 mg  50 mg Oral QHS PRN,MR X 1 Sanjuana Kava, NP        Lab Results:  Results for orders placed during the hospital encounter of 07/04/12 (from the past 48 hour(s))  GLUCOSE, CAPILLARY     Status: Abnormal   Collection Time    07/05/12 11:57 AM      Result Value Range   Glucose-Capillary 186 (*) 70 - 99 mg/dL   Comment 1 Notify RN    GLUCOSE, CAPILLARY     Status: Abnormal   Collection Time    07/05/12  5:10 PM      Result Value Range   Glucose-Capillary 172 (*) 70 - 99 mg/dL  GLUCOSE, CAPILLARY     Status: Abnormal   Collection Time    07/05/12  8:35 PM      Result Value Range   Glucose-Capillary 192 (*) 70 - 99 mg/dL   Comment 1 Notify RN    GLUCOSE, CAPILLARY     Status: Abnormal   Collection Time    07/06/12  6:07 AM      Result Value Range   Glucose-Capillary 171 (*) 70 - 99 mg/dL  GLUCOSE, CAPILLARY     Status: Abnormal   Collection Time    07/06/12 11:55 AM      Result Value Range   Glucose-Capillary 193 (*) 70 - 99 mg/dL  GLUCOSE, CAPILLARY     Status: Abnormal   Collection Time    07/06/12  5:10 PM      Result  Value Range   Glucose-Capillary  237 (*) 70 - 99 mg/dL  GLUCOSE, CAPILLARY     Status: Abnormal   Collection Time    07/06/12  9:49 PM      Result Value Range   Glucose-Capillary 301 (*) 70 - 99 mg/dL   Comment 1 Notify RN    GLUCOSE, CAPILLARY     Status: Abnormal   Collection Time    07/07/12  6:18 AM      Result Value Range   Glucose-Capillary 272 (*) 70 - 99 mg/dL    Physical Findings: AIMS: Facial and Oral Movements Muscles of Facial Expression: None, normal Lips and Perioral Area: None, normal Jaw: None, normal Tongue: None, normal,Extremity Movements Upper (arms, wrists, hands, fingers): None, normal Lower (legs, knees, ankles, toes): None, normal, Trunk Movements Neck, shoulders, hips: None, normal, Overall Severity Severity of abnormal movements (highest score from questions above): None, normal Incapacitation due to abnormal movements: None, normal, Dental Status Current problems with teeth and/or dentures?: No Does patient usually wear dentures?: No  CIWA:  CIWA-Ar Total: 5 COWS:     Treatment Plan Summary: Daily contact with patient to assess and evaluate symptoms and progress in treatment Medication management  Plan:1. Admit for crisis management and stabilization. 2. Medication management to reduce current symptoms to base line and improve the     patient's overall level of functioning 3. Treat health problems as indicated. 4. Develop treatment plan to decrease risk of relapse upon discharge and the need for     readmission. 5. Psycho-social education regarding relapse prevention and self care. 6. Health care follow up as needed for medical problems. 7. Restart home medications where appropriate. 8. Increase Naprosyn to 500mg  po BID with food for Headache  Medical Decision Making Problem Points:  Established problem, stable/improving (1), Review of last therapy session (1) and Review of psycho-social stressors (1) Data Points:  Order Aims Assessment  (2) Review of medication regiment & side effects (2) Review of new medications or change in dosage (2)  I certify that inpatient services furnished can reasonably be expected to improve the patient's condition.   Ceriah Kohler,MD 07/07/2012, 10:27 AM

## 2012-07-07 NOTE — Progress Notes (Addendum)
Patient ID: Mitchell [name], male   DOB: 06/16/1975, 37 y.o.   MRN: [id number] 07-07-12@ 1415 nursing shift note: pt had complaints of n/v and a headache. His CBG's have been 272 at 0600 am and 210 at 1200. He was given his SSI according to orders. A: an order was obtained for Imitrex and he has been given x2 doses, at 1034 am & 1329. He also given Zofran for his n/v and it was effective. R: he headache continues. He denied any SI. On his inventory sheet he wrote he requested sleep medication, appetite improving, energy normal with his depression at 7 and hopelessness at 6. RN will continue to address his headache pain and will continue to monitor and Q 15 min ck's continue. He has also been provided an interpreter; angel till 1200 noon, then monica came in at 1200 noon. They are from language resources ph # [phone].

## 2012-07-07 NOTE — Progress Notes (Signed)
Patient ID: Mitchell [name], male   DOB: 23-Apr-1975, 37 y.o.   MRN: [id number]  D: Pt denies SI/HI/AVH. Pt is pleasant and cooperative. Spoke to interpreter and [employer], and pt did not have any additional concerns. Pt states he is still having issues with the HA.  A: Pt was offered support and encouragement. Pt was given scheduled medications. Pt was encourage to attend groups. Q 15 minute checks were done for safety. Pt was encouraged to drink water.   R:Pt attends groups and interacts well with peers and staff. Pt is taking medication.Pt receptive to treatment and safety maintained on unit.

## 2012-07-08 DIAGNOSIS — F10239 Alcohol dependence with withdrawal, unspecified: Secondary | ICD-10-CM

## 2012-07-08 DIAGNOSIS — F333 Major depressive disorder, recurrent, severe with psychotic symptoms: Secondary | ICD-10-CM

## 2012-07-08 DIAGNOSIS — F1020 Alcohol dependence, uncomplicated: Secondary | ICD-10-CM

## 2012-07-08 LAB — GLUCOSE, CAPILLARY
Glucose-Capillary: 238 mg/dL — ABNORMAL HIGH (ref 70–99)
Glucose-Capillary: 248 mg/dL — ABNORMAL HIGH (ref 70–99)
Glucose-Capillary: 343 mg/dL — ABNORMAL HIGH (ref 70–99)

## 2012-07-08 MED ORDER — GLUCERNA SHAKE PO LIQD
237.0000 mL | Freq: Three times a day (TID) | ORAL | Status: DC
  Administered 2012-07-08 – 2012-07-11 (×9): 237 mL via ORAL

## 2012-07-08 NOTE — Progress Notes (Signed)
[facility] Group Notes:  (Nursing/MHT/Case Management/Adjunct)  Date:  07/08/2012  Time:  2000  Type of Therapy:  Psychoeducational Skills  Participation Level:  Active  Participation Quality:  Attentive  Affect:  Depressed  Cognitive:  Appropriate  Insight:  Improving  Engagement in Group:  Engaged  Modes of Intervention:  Education  Summary of Progress/Problems: The patient shared with the group that he didn't have a very good day. He stated that he had a bad headache for much of the day and that he has had these symptoms since Monday. He also shared that he misses his children, but he is hopeful that they may come in to visit him tomorrow. His goal for tomorrow is to visit with his children.   Hazle Coca S 07/08/2012, 11:59 PM

## 2012-07-08 NOTE — Progress Notes (Signed)
Patient ID: Mitchell [name], male   DOB: November 04, 1975, 37 y.o.   MRN: [id number] 07-08-12 nursing shift note: this pt is polite/cooperative. He has had a interpreter during the am and early afternoon. He is spanish speaking. He asked for some Glucerna and had a headache. A: RN obtained an order for the Glucerna, got it and administered him an Imitrex for his headache. R: he thanked staff for the Glucerna and the Imitrex reduced his headache to a 5. He also was using warm washcloth on his head. R: on his inventory sheet he wrote; he requested sleep medication, appetite improving, energy normal, attention improving with his depression at 6 and hopelessness at 6. He denied any SI. RN will monitor and Q 15 min ck's continue.

## 2012-07-08 NOTE — Progress Notes (Signed)
Patient ID: Mitchell [name], male   DOB: 1975-07-27, 37 y.o.   MRN: [id number]  D:  Pt endorses AH, but says he only hears them a little.  Pt denies SI/HI/VH. Interpreter continues to come to help clarify information if needed. Pt states he is concerned about his blood sugar. He wants to get it under control and is concerned about it being constantly high.    A: Pt was offered support and encouragement. Pt was given scheduled medications. Pt was encourage to attend groups. Q 15 minute checks were done for safety. Pt was informed about a balanced diet and plenty of water, along with fruits and vegetables.  R:Pt attends groups and interacts well with peers and staff. Pt is taking medication.Pt receptive to treatment and safety maintained on unit.

## 2012-07-08 NOTE — [facility] Group Notes (Signed)
[facility] LCSW Group Therapy  Stages of Change 07/08/2012  1:12 PM    Type of Therapy:  Group Therapy  Participation Level:  Active  Participation Quality:  Appropriate  Affect:  Appropriate  Cognitive:  Appropriate  Insight:  Developing/Improving and Engaged  Engagement in Therapy:  Developing/Improving and Engaged  Modes of Intervention:  Discussion, Education, Exploration, Problem-Solving, Rapport Building, Support  Summary of Progress/Problems: The focus for today's process group was sabotage.  Patient were asked to identify what sabotage means to them and ways they have sabotaged their past recovery. He shared through interpreter he can sabotage his recovery by not loving himself and taking his medications.  He shared he needs to get away from the negativity in his life.   [name] 07/08/2012  1:12 PM

## 2012-07-08 NOTE — Progress Notes (Signed)
Patient ID: Mitchell [name], male   DOB: 1975/07/10, 37 y.o.   MRN: [id number] Psychoeducational Group Note  Date:  07/08/2012 Time:1000am  Group Topic/Focus:  Identifying Needs:   The focus of this group is to help patients identify their personal needs that have been historically problematic and identify healthy behaviors to address their needs.  Participation Level:  Active  Participation Quality:  Appropriate  Affect:  Anxious  Cognitive:  Appropriate  Insight:  Supportive  Engagement in Group:  Supportive  Additional Comments:  Inventory and Psychoeducational group- pt had an interpreter.    Valente David 07/08/2012,10:21 AM

## 2012-07-08 NOTE — Progress Notes (Signed)
Patient ID: Mitchell [name], male   DOB: Apr 08, 1975, 37 y.o.   MRN: [id number] [facility] MD Progress Note  07/08/2012 5:30 PM Mitchell [name]  MRN:  [id number] Subjective:   Sleepy today. Reports getting better. Has mild bleeding on toilet paper. Good sleep.   Objective:    Diagnosis:  Axis I: MDD-Recurrent episode severe with psychosis                                Alcohol dependence/withdrawal   ADL's:  Intact  Sleep: Fair  Appetite:  Fair  Suicidal Ideation: yes Plan:  denies Intent:  denies Means:  denies Homicidal Ideation:  Plan:  denies Intent:  denies Means:  denies AEB (as evidenced by):  Psychiatric Specialty Exam: Review of Systems  Constitutional: Positive for malaise/fatigue.  Eyes: Positive for blurred vision.  Respiratory: Negative.   Cardiovascular: Negative.   Gastrointestinal: Positive for nausea.  Genitourinary: Negative.   Musculoskeletal: Positive for myalgias.  Skin: Negative.   Neurological: Positive for tremors and headaches.  Endo/Heme/Allergies: Negative.   Psychiatric/Behavioral: Positive for depression and hallucinations. The patient is nervous/anxious.     Blood pressure 126/88, pulse 80, temperature 97.6 F (36.4 C), temperature source Oral, resp. rate 18, height 5' 2.6" (1.59 m), weight 77.111 kg (170 lb).Body mass index is 30.5 kg/(m^2).  General Appearance: Fairly Groomed  Patent attorney::  Minimal  Speech:  Clear and Coherent  Volume:  Decreased  Mood:  Anxious and Depressed  Affect:  Blunt  Thought Process:  Goal Directed  Orientation:  Full (Time, Place, and Person)  Thought Content:  deneis  Suicidal Thoughts:  none  Homicidal Thoughts:  No  Memory:  Immediate;   Fair Recent;   Fair Remote;   Fair  Judgement:  Poor  Insight:  Lacking  Psychomotor Activity:  Decreased  Concentration:  Fair  Recall:  Fair  Akathisia:  No  Handed:  Right  AIMS (if indicated):     Assets:  Communication Skills Desire for Improvement  Sleep:   Number of Hours: 6.75   Current Medications: Current Facility-Administered Medications  Medication Dose Route Frequency Provider Last Rate Last Dose  . alum & mag hydroxide-simeth (MAALOX/MYLANTA) 200-200-20 MG/5ML suspension 30 mL  30 mL Oral Q4H PRN Shuvon Rankin, NP      . chlordiazePOXIDE (LIBRIUM) capsule 25 mg  25 mg Oral Once Shuvon Rankin, NP      . [START ON 07/09/2012] chlordiazePOXIDE (LIBRIUM) capsule 25 mg  25 mg Oral Daily Shuvon Rankin, NP      . citalopram (CELEXA) tablet 40 mg  40 mg Oral Daily Shuvon Rankin, NP   40 mg at 07/08/12 0739  . feeding supplement (GLUCERNA SHAKE) liquid 237 mL  237 mL Oral TID BM Larena Sox, MD   237 mL at 07/08/12 1342  . glimepiride (AMARYL) tablet 4 mg  4 mg Oral QAC breakfast Shuvon Rankin, NP   4 mg at 07/08/12 0657  . insulin aspart (novoLOG) injection 0-15 Units  0-15 Units Subcutaneous TID WC Kerry Hough, PA-C   5 Units at 07/08/12 1202  . insulin aspart (novoLOG) injection 0-5 Units  0-5 Units Subcutaneous QHS Kerry Hough, PA-C   4 Units at 07/07/12 2120  . magnesium hydroxide (MILK OF MAGNESIA) suspension 30 mL  30 mL Oral Daily PRN Shuvon Rankin, NP      . metFORMIN (GLUCOPHAGE) tablet 1,000 mg  1,000 mg Oral BID WC  Shuvon Rankin, NP   1,000 mg at 07/08/12 0739  . multivitamin with minerals tablet 1 tablet  1 tablet Oral Daily Shuvon Rankin, NP   1 tablet at 07/08/12 0739  . naproxen (NAPROSYN) tablet 500 mg  500 mg Oral BID WC Mojeed Akintayo   500 mg at 07/08/12 0739  . pantoprazole (PROTONIX) EC tablet 40 mg  40 mg Oral Daily Sanjuana Kava, NP   40 mg at 07/08/12 0741  . risperiDONE (RISPERDAL) tablet 1 mg  1 mg Oral QHS Mojeed Akintayo   1 mg at 07/07/12 2202  . SUMAtriptan (IMITREX) tablet 50 mg  50 mg Oral Q2H PRN Fransisca Kaufmann, NP   50 mg at 07/08/12 1053  . thiamine (B-1) injection 100 mg  100 mg Intramuscular Once Shuvon Rankin, NP      . thiamine (VITAMIN B-1) tablet 100 mg  100 mg Oral Daily Shuvon Rankin, NP   100 mg  at 07/08/12 0741  . traZODone (DESYREL) tablet 50 mg  50 mg Oral QHS,MR X 1 Kerry Hough, PA-C   50 mg at 07/07/12 2202  . traZODone (DESYREL) tablet 50 mg  50 mg Oral QHS PRN,MR X 1 Sanjuana Kava, NP        Lab Results:  Results for orders placed during the hospital encounter of 07/04/12 (from the past 48 hour(s))  GLUCOSE, CAPILLARY     Status: Abnormal   Collection Time    07/06/12  9:49 PM      Result Value Range   Glucose-Capillary 301 (*) 70 - 99 mg/dL   Comment 1 Notify RN    GLUCOSE, CAPILLARY     Status: Abnormal   Collection Time    07/07/12  6:18 AM      Result Value Range   Glucose-Capillary 272 (*) 70 - 99 mg/dL  GLUCOSE, CAPILLARY     Status: Abnormal   Collection Time    07/07/12 12:03 PM      Result Value Range   Glucose-Capillary 210 (*) 70 - 99 mg/dL  GLUCOSE, CAPILLARY     Status: Abnormal   Collection Time    07/07/12  5:00 PM      Result Value Range   Glucose-Capillary 256 (*) 70 - 99 mg/dL  GLUCOSE, CAPILLARY     Status: Abnormal   Collection Time    07/07/12  9:10 PM      Result Value Range   Glucose-Capillary 309 (*) 70 - 99 mg/dL  GLUCOSE, CAPILLARY     Status: Abnormal   Collection Time    07/08/12  6:44 AM      Result Value Range   Glucose-Capillary 238 (*) 70 - 99 mg/dL  GLUCOSE, CAPILLARY     Status: Abnormal   Collection Time    07/08/12 12:00 PM      Result Value Range   Glucose-Capillary 248 (*) 70 - 99 mg/dL   Comment 1 Documented in Chart     Comment 2 Notify RN    GLUCOSE, CAPILLARY     Status: Abnormal   Collection Time    07/08/12  5:11 PM      Result Value Range   Glucose-Capillary 343 (*) 70 - 99 mg/dL   Comment 1 Documented in Chart     Comment 2 Notify RN      Physical Findings: AIMS: Facial and Oral Movements Muscles of Facial Expression: None, normal Lips and Perioral Area: None, normal Jaw: None, normal Tongue: None, normal,Extremity Movements  Upper (arms, wrists, hands, fingers): None, normal Lower (legs,  knees, ankles, toes): None, normal, Trunk Movements Neck, shoulders, hips: None, normal, Overall Severity Severity of abnormal movements (highest score from questions above): None, normal Incapacitation due to abnormal movements: None, normal, Dental Status Current problems with teeth and/or dentures?: No Does patient usually wear dentures?: No  CIWA:  CIWA-Ar Total: 3 COWS:     Treatment Plan Summary: Daily contact with patient to assess and evaluate symptoms and progress in treatment Medication management  Plan;  Encourage to drink water. Was asked to observe any bleeding again  Medical Decision Making Problem Points:  Established problem, stable/improving (1), Review of last therapy session (1) and Review of psycho-social stressors (1) Data Points:  Order Aims Assessment (2) Review of medication regiment & side effects (2) Review of new medications or change in dosage (2)  I certify that inpatient services furnished can reasonably be expected to improve the patient's condition.   Laverne Klugh,MD 07/08/2012, 5:30 PM

## 2012-07-09 DIAGNOSIS — F1010 Alcohol abuse, uncomplicated: Secondary | ICD-10-CM

## 2012-07-09 LAB — GLUCOSE, CAPILLARY
Glucose-Capillary: 397 mg/dL — ABNORMAL HIGH (ref 70–99)
Glucose-Capillary: 451 mg/dL — ABNORMAL HIGH (ref 70–99)
Glucose-Capillary: 400 mg/dL — ABNORMAL HIGH (ref 70–99)
Glucose-Capillary: 342 mg/dL — ABNORMAL HIGH (ref 70–99)

## 2012-07-09 NOTE — [facility] Group Notes (Signed)
[facility] LCSW Group Therapy  07/09/2012  11:00  Type of Therapy:  Group Therapy  Participation Level:  Active  Participation Quality:  Attentive  Affect:  Appropriate  Cognitive:  Oriented  Insight:  Limited  Engagement in Therapy:  Engaged  Modes of Intervention:  Discussion, Exploration and Socialization  Summary of Progress/Problems:   The main focus of today's process group was to identify the patient's current support system and decide on other supports that can be put in place. Four definitions/levels of support were discussed and an exercise was utilized to show how much stronger we become with additional supports. An emphasis was placed on using counselor, doctor, therapy groups, 12-step groups, and problem-specific support groups to expand supports, as well as doing something different than has been done before.  Sydney identified his uncle as his main support.  When asked about expansion, he could not come up with any ideas, so I reminded him of AA and how he said that was helpful.  He agreed, and other group members encouraged him to ask for rides and support from other AA members.  [place], LCSW 07/09/2012 12:24 PM

## 2012-07-09 NOTE — Progress Notes (Addendum)
Patient ID: Mitchell [name], male   DOB: May 15, 1975, 37 y.o.   MRN: [id number] 07-09-12 nursing shift note: pt went into talk with the MD at about 1400 accompanied by RN and interpreter. The pt stated he had been hearing voices telling to kill himself in the past, not today. He reports that his job is stressful and he feels pressured to get his job done in an allotted period of time, stating it's not enough time. His axis I per the MD is etoh abuse. A: staff continues to encourage and support this pt and reminding him of being aware of his food selections due to his diabetes. This was all done by the spanish interpreter. R: he stated he will try to comply. He is not having any overt w/d symptoms at this time. He denies any SI. On his inventory sheet her wrote; requested sleep medication, appetite improving, energy normal, attention improving with his depression at 7 and hopelessness at 4 or 5. He has had no other complaints today. Staff reported that the pt's food selections have not been conducive to a carb modified diet. RN will monitor and Q 15 min ck's continue.

## 2012-07-09 NOTE — Progress Notes (Signed)
Patient ID: Mitchell [name], male   DOB: May 20, 1975, 37 y.o.   MRN: [id number] 07-09-12 @ 1841 nursing note: d: CBG had been 404 prior to dinner. He was given 15 units of novolog. The parameters are  351 to 400 with the existing order. RN checked with extender who had said just administer the 15 units of the novolog for now. A: rechecked the CBG at 1830 and CBG was 400. Notified extender by phone.R: extender advised RN to wait until bedtime coverage to see if the issue needs to be readdressed. Please monitor.

## 2012-07-09 NOTE — Progress Notes (Addendum)
[facility] MD Progress Note  07/09/2012 1:39 PM Mitchell [name]  MRN:  [id number] Subjective:   The patient reports that he has been hearing voices for the past week telling him to kill himself.  The patient reports that the voice of his deceased father was the voice he was hearing.  The patient reports his job has been his only stress has been a new job.  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  He reports that he was drinking and at that point he started he started to hear his father telling him to kill himself. He reports that he feels sometimes he should hurt.   Diagnosis:  Axis I: Alcohol Abuse  ADL's:  Intact  Sleep: Good  Appetite:  Good  Suicidal Ideation:  Plan:  Patient denies Intent:  Patient denies Means:  Patient denies Homicidal Ideation:  Plan:  Patient denies Intent:  Patient denies Means:  Patient denies AEB (as evidenced by): Patient   Psychiatric Specialty Exam: ROS  Blood pressure 117/74, pulse 96, temperature 97 F (36.1 C), temperature source Oral, resp. rate 16, height 5' 2.6" (1.59 m), weight 77.111 kg (170 lb).Body mass index is 30.5 kg/(m^2).  General Appearance: Casual and Fairly Groomed  Patent attorney::  Fair  Speech:  Normal Rate  Volume:  Normal  Mood:  Depressed and Hopeless  Affect:  Appropriate, Congruent and Full Range  Thought Process:  Coherent, Linear and Logical  Orientation:  Full (Time, Place, and Person)  Thought Content:  WDL  Suicidal Thoughts:  No  Homicidal Thoughts:  No  Memory:  Immediate;   Good Recent;   Poor Remote;   Fair  Judgement:  Fair  Insight:  Shallow  Psychomotor Activity:  Normal  Concentration:  Fair  Recall:  Fair  Akathisia:  Negative  Handed:  Right  AIMS (if indicated):   As noted   Assets:  Communication Skills Desire for Improvement Financial Resources/Insurance Housing Intimacy Leisure Time Physical Health Resilience Social Support  Sleep:  Number of  Hours: 5.5   Current Medications: Current Facility-Administered Medications  Medication Dose Route Frequency Provider Last Rate Last Dose  . alum & mag hydroxide-simeth (MAALOX/MYLANTA) 200-200-20 MG/5ML suspension 30 mL  30 mL Oral Q4H PRN Shuvon Rankin, NP      . chlordiazePOXIDE (LIBRIUM) capsule 25 mg  25 mg Oral Once Shuvon Rankin, NP      . citalopram (CELEXA) tablet 40 mg  40 mg Oral Daily Shuvon Rankin, NP   40 mg at 07/09/12 0749  . feeding supplement (GLUCERNA SHAKE) liquid 237 mL  237 mL Oral TID BM Larena Sox, MD   237 mL at 07/09/12 0814  . glimepiride (AMARYL) tablet 4 mg  4 mg Oral QAC breakfast Shuvon Rankin, NP   4 mg at 07/09/12 4098  . insulin aspart (novoLOG) injection 0-15 Units  0-15 Units Subcutaneous TID WC Kerry Hough, PA-C   15 Units at 07/09/12 1209  . insulin aspart (novoLOG) injection 0-5 Units  0-5 Units Subcutaneous QHS Kerry Hough, PA-C   3 Units at 07/08/12 2237  . magnesium hydroxide (MILK OF MAGNESIA) suspension 30 mL  30 mL Oral Daily PRN Shuvon Rankin, NP      . metFORMIN (GLUCOPHAGE) tablet 1,000 mg  1,000 mg Oral BID WC Shuvon Rankin, NP   1,000 mg at 07/09/12 0749  . multivitamin with minerals tablet 1 tablet  1 tablet Oral Daily Shuvon Rankin, NP  1 tablet at 07/09/12 0749  . naproxen (NAPROSYN) tablet 500 mg  500 mg Oral BID WC Mojeed Akintayo   500 mg at 07/09/12 0749  . pantoprazole (PROTONIX) EC tablet 40 mg  40 mg Oral Daily Sanjuana Kava, NP   40 mg at 07/09/12 0749  . risperiDONE (RISPERDAL) tablet 1 mg  1 mg Oral QHS Mojeed Akintayo   1 mg at 07/08/12 2236  . SUMAtriptan (IMITREX) tablet 50 mg  50 mg Oral Q2H PRN Fransisca Kaufmann, NP   50 mg at 07/08/12 1053  . thiamine (B-1) injection 100 mg  100 mg Intramuscular Once Shuvon Rankin, NP      . thiamine (VITAMIN B-1) tablet 100 mg  100 mg Oral Daily Shuvon Rankin, NP   100 mg at 07/09/12 0749  . traZODone (DESYREL) tablet 50 mg  50 mg Oral QHS,MR X 1 Spencer E Simon, PA-C   50 mg at  07/09/12 0002  . traZODone (DESYREL) tablet 50 mg  50 mg Oral QHS PRN,MR X 1 Sanjuana Kava, NP        Lab Results:  Results for orders placed during the hospital encounter of 07/04/12 (from the past 48 hour(s))  GLUCOSE, CAPILLARY     Status: Abnormal   Collection Time    07/07/12  5:00 PM      Result Value Range   Glucose-Capillary 256 (*) 70 - 99 mg/dL  GLUCOSE, CAPILLARY     Status: Abnormal   Collection Time    07/07/12  9:10 PM      Result Value Range   Glucose-Capillary 309 (*) 70 - 99 mg/dL  GLUCOSE, CAPILLARY     Status: Abnormal   Collection Time    07/08/12  6:44 AM      Result Value Range   Glucose-Capillary 238 (*) 70 - 99 mg/dL  GLUCOSE, CAPILLARY     Status: Abnormal   Collection Time    07/08/12 12:00 PM      Result Value Range   Glucose-Capillary 248 (*) 70 - 99 mg/dL   Comment 1 Documented in Chart     Comment 2 Notify RN    GLUCOSE, CAPILLARY     Status: Abnormal   Collection Time    07/08/12  5:11 PM      Result Value Range   Glucose-Capillary 343 (*) 70 - 99 mg/dL   Comment 1 Documented in Chart     Comment 2 Notify RN    GLUCOSE, CAPILLARY     Status: Abnormal   Collection Time    07/08/12  8:57 PM      Result Value Range   Glucose-Capillary 297 (*) 70 - 99 mg/dL   Comment 1 Notify RN    GLUCOSE, CAPILLARY     Status: Abnormal   Collection Time    07/09/12  6:06 AM      Result Value Range   Glucose-Capillary 238 (*) 70 - 99 mg/dL   Comment 1 Notify RN    GLUCOSE, CAPILLARY     Status: Abnormal   Collection Time    07/09/12 12:06 PM      Result Value Range   Glucose-Capillary 451 (*) 70 - 99 mg/dL   Comment 1 Documented in Chart     Comment 2 Notify RN    GLUCOSE, CAPILLARY     Status: Abnormal   Collection Time    07/09/12 12:07 PM      Result Value Range   Glucose-Capillary 397 (*)  70 - 99 mg/dL   Comment 1 Documented in Chart     Comment 2 Notify RN      Physical Findings: AIMS: Facial and Oral Movements Muscles of Facial  Expression: None, normal Lips and Perioral Area: None, normal Jaw: None, normal Tongue: None, normal,Extremity Movements Upper (arms, wrists, hands, fingers): None, normal Lower (legs, knees, ankles, toes): None, normal, Trunk Movements Neck, shoulders, hips: None, normal, Overall Severity Severity of abnormal movements (highest score from questions above): None, normal Incapacitation due to abnormal movements: None, normal, Dental Status Current problems with teeth and/or dentures?: No Does patient usually wear dentures?: No  CIWA:  CIWA-Ar Total: 0 COWS:     Treatment Plan Summary: Daily contact with patient to assess and evaluate symptoms and progress in treatment Medication management  Plan: Continue Risperidone 1 mg.  Patient has completed Librium protocol. Medical Decision Making Problem Points:  Established problem, stable/improving (1), Review of last therapy session (1) and Review of psycho-social stressors (1) Data Points:  Order Aims Assessment (2) Review and summation of old records (2) Review of medication regiment & side effects (2) Review of new medications or change in dosage (2)  I certify that inpatient services furnished can reasonably be expected to improve the patient's condition.   Annabell Oconnor 07/09/2012, 1:39 PM

## 2012-07-09 NOTE — Progress Notes (Signed)
Patient ID: Helmer Dull, male   DOB: 27-Apr-1975, 37 y.o.   MRN: [id number] Psychoeducational Group Note  Date:  07/09/2012 Time:  1000am  Group Topic/Focus:  Making Healthy Choices:   The focus of this group is to help patients identify negative/unhealthy choices they were using prior to admission and identify positive/healthier coping strategies to replace them upon discharge.  Participation Level:  Active  Participation Quality:  Appropriate  Affect:  Appropriate  Cognitive:  Appropriate  Insight:  Supportive  Engagement in Group:  Supportive  Additional Comments:  Inventory and Psychoeducational group- interpreter present  Valente David 07/09/2012,10:28 AM

## 2012-07-09 NOTE — Progress Notes (Signed)
Patient ID: Mitchell [name], male   DOB: [birth year], 37 y.o.   MRN: [id number]  D: Patient sitting on side of bed when checking on him. Requesting pitcher of water and something for sleep. English limited.  A: Staff will monitor on q 15 minute checks, follow treatment plan, and give meds as ordered. R: Took repeat trazodone and given pitcher of water. No further complaints.

## 2012-07-10 ENCOUNTER — Encounter (HOSPITAL_COMMUNITY): Payer: Self-pay | Admitting: Registered Nurse

## 2012-07-10 LAB — GLUCOSE, CAPILLARY: Glucose-Capillary: 207 mg/dL — ABNORMAL HIGH (ref 70–99)

## 2012-07-10 LAB — BASIC METABOLIC PANEL
BUN: 9 mg/dL (ref 6–23)
Creatinine, Ser: 0.67 mg/dL (ref 0.50–1.35)
GFR calc Af Amer: >90 mL/min (ref >90)
GFR calc non Af Amer: >90 mL/min (ref >90)

## 2012-07-10 MED ORDER — INSULIN ASPART 100 UNIT/ML ~~LOC~~ SOLN
15.0000 [IU] | Freq: Once | SUBCUTANEOUS | Status: AC
  Administered 2012-07-10: 15 [IU] via SUBCUTANEOUS

## 2012-07-10 MED ORDER — INSULIN GLARGINE 100 UNIT/ML ~~LOC~~ SOLN
10.0000 [IU] | Freq: Every day | SUBCUTANEOUS | Status: DC
  Administered 2012-07-10: 10 [IU] via SUBCUTANEOUS
  Filled 2012-07-10 (×2): qty 10

## 2012-07-10 MED ORDER — INSULIN ASPART 100 UNIT/ML ~~LOC~~ SOLN
3.0000 [IU] | Freq: Three times a day (TID) | SUBCUTANEOUS | Status: DC
  Administered 2012-07-10 – 2012-07-11 (×3): 3 [IU] via SUBCUTANEOUS
  Filled 2012-07-10: qty 10

## 2012-07-10 NOTE — Clinical Social Work Note (Signed)
Attempted to contact uncle with pt and interpreter for both [facility] and confirm that pt will return there at d/c this week.  Pt did not have number.  We called his wife, who said she is off work Advertising account executive, so she can get the number.  We will try again tomorrow.

## 2012-07-10 NOTE — Progress Notes (Signed)
Child/Adolescent Psychoeducational Group Note  Date:  07/10/2012 Time:  11:54 AM  Group Topic/Focus:  Self Care:   The focus of this group is to help patients understand the importance of self-care in order to improve or restore emotional, physical, spiritual, interpersonal, and financial health.  Participation Level:  Active  Participation Quality:  Appropriate and Attentive  Affect:  Appropriate  Cognitive:  Alert and Appropriate  Insight:  Appropriate  Engagement in Group:  Engaged  Modes of Intervention:  Discussion and Education  Additional Comments:  Pt participated in group discussion.  Shelly Bombard D 07/10/2012, 11:54 AM

## 2012-07-10 NOTE — Progress Notes (Signed)
Pt CBG rechecked (436), NP Fransisca Kaufmann) made aware. Diabetic consult pending per NP request.

## 2012-07-10 NOTE — Progress Notes (Signed)
Patient ID: Mitchell [name], male   DOB: Mar 01, 1975, 37 y.o.   MRN: [id number]   D: Through the interpretor the pt informed the writer that "he's hungry all the time". Stated that he was only able to get a salad. Informed the pt that the writer would accompany him to breakfast in the morning to show options. Also informed that writer would report to nurse to possibly assist pt during lunch and dinner for healthier options other than salads. Writer explains that she provides salad for his hs snacks because other snacks on the unit aren't healthy due to his elevated blood sugar. Also explained the addition of the lantus.   A:  Support and encouragement was offered. 15 min checks continued for safety.  R: Pt remains safe.

## 2012-07-10 NOTE — Progress Notes (Signed)
Patient ID: Mitchell [name], male   DOB: [birth year], 37 y.o.   MRN: [id number] [facility] MD Progress Note  07/10/2012 10:19 AM Mitchell [name]  MRN:  [id number] Subjective:  Patient states that he is still having a headache.  States that medications does help lower the pain for 2-3 hours then pain is back.  Patient states that he will follow up with his primary physician for headaches.  Diagnosis:  Axis I: Alcohol Abuse  ADL's:  Intact  Sleep: Good  Appetite:  Good  Suicidal Ideation:  Plan:  Patient denies Intent:  Patient denies Means:  Patient denies Homicidal Ideation:  Plan:  Patient denies Intent:  Patient denies Means:  Patient denies AEB (as evidenced by): Patient continues to participate in group sessions and tolerating medication without adverse effects.  Patient states that the medication that he was given for headache last night and this morning works better.    Psychiatric Specialty Exam: Review of Systems  Musculoskeletal:       Right arm pain related to previous sergery   Neurological: Positive for speech change.  Psychiatric/Behavioral: Positive for depression, hallucinations ( patinet states that he was hearing voices this morning.  "I try to ingnore them") and substance abuse. The patient is nervous/anxious.   All other systems reviewed and are negative.    Blood pressure 123/67, pulse 98, temperature 98.3 F (36.8 C), temperature source Oral, resp. rate 18, height 5' 2.6" (1.59 m), weight 77.111 kg (170 lb).Body mass index is 30.5 kg/(m^2).  General Appearance: Casual and Fairly Groomed  Patent attorney::  Fair  Speech:  Normal Rate  Volume:  Normal  Mood:  Depressed and Hopeless  Affect:  Appropriate, Congruent and Full Range  Thought Process:  Coherent, Linear and Logical  Orientation:  Full (Time, Place, and Person)  Thought Content:  WDL  Suicidal Thoughts:  No  Homicidal Thoughts:  No  Memory:  Immediate;   Good Recent;   Poor Remote;   Fair  Judgement:   Fair  Insight:  Shallow  Psychomotor Activity:  Normal  Concentration:  Fair  Recall:  Fair  Akathisia:  Negative  Handed:  Right  AIMS (if indicated):   As noted   Assets:  Communication Skills Desire for Improvement Financial Resources/Insurance Housing Intimacy Leisure Time Physical Health Resilience Social Support  Sleep:  Number of Hours: 6.5   Current Medications: Current Facility-Administered Medications  Medication Dose Route Frequency Provider Last Rate Last Dose  . alum & mag hydroxide-simeth (MAALOX/MYLANTA) 200-200-20 MG/5ML suspension 30 mL  30 mL Oral Q4H PRN Shuvon Rankin, NP      . chlordiazePOXIDE (LIBRIUM) capsule 25 mg  25 mg Oral Once Shuvon Rankin, NP      . citalopram (CELEXA) tablet 40 mg  40 mg Oral Daily Shuvon Rankin, NP   40 mg at 07/10/12 0818  . feeding supplement (GLUCERNA SHAKE) liquid 237 mL  237 mL Oral TID BM Larena Sox, MD   237 mL at 07/10/12 0927  . glimepiride (AMARYL) tablet 4 mg  4 mg Oral QAC breakfast Shuvon Rankin, NP   4 mg at 07/10/12 1308  . insulin aspart (novoLOG) injection 0-15 Units  0-15 Units Subcutaneous TID WC Kerry Hough, PA-C   5 Units at 07/10/12 [phone]  . insulin aspart (novoLOG) injection 0-5 Units  0-5 Units Subcutaneous QHS Kerry Hough, PA-C   4 Units at 07/09/12 2224  . magnesium hydroxide (MILK OF MAGNESIA) suspension 30 mL  30 mL Oral Daily  PRN Shuvon Rankin, NP      . metFORMIN (GLUCOPHAGE) tablet 1,000 mg  1,000 mg Oral BID WC Shuvon Rankin, NP   1,000 mg at 07/10/12 0818  . multivitamin with minerals tablet 1 tablet  1 tablet Oral Daily Shuvon Rankin, NP   1 tablet at 07/10/12 0818  . naproxen (NAPROSYN) tablet 500 mg  500 mg Oral BID WC Mojeed Akintayo   500 mg at 07/10/12 0818  . pantoprazole (PROTONIX) EC tablet 40 mg  40 mg Oral Daily Sanjuana Kava, NP   40 mg at 07/10/12 0818  . risperiDONE (RISPERDAL) tablet 1 mg  1 mg Oral QHS Mojeed Akintayo   1 mg at 07/09/12 2224  . SUMAtriptan (IMITREX) tablet  50 mg  50 mg Oral Q2H PRN Fransisca Kaufmann, NP   50 mg at 07/10/12 0641  . thiamine (B-1) injection 100 mg  100 mg Intramuscular Once Shuvon Rankin, NP      . thiamine (VITAMIN B-1) tablet 100 mg  100 mg Oral Daily Shuvon Rankin, NP   100 mg at 07/10/12 0818  . traZODone (DESYREL) tablet 50 mg  50 mg Oral QHS,MR X 1 Kerry Hough, PA-C   50 mg at 07/09/12 2224  . traZODone (DESYREL) tablet 50 mg  50 mg Oral QHS PRN,MR X 1 Sanjuana Kava, NP        Lab Results:  Results for orders placed during the hospital encounter of 07/04/12 (from the past 48 hour(s))  GLUCOSE, CAPILLARY     Status: Abnormal   Collection Time    07/08/12 12:00 PM      Result Value Range   Glucose-Capillary 248 (*) 70 - 99 mg/dL   Comment 1 Documented in Chart     Comment 2 Notify RN    GLUCOSE, CAPILLARY     Status: Abnormal   Collection Time    07/08/12  5:11 PM      Result Value Range   Glucose-Capillary 343 (*) 70 - 99 mg/dL   Comment 1 Documented in Chart     Comment 2 Notify RN    GLUCOSE, CAPILLARY     Status: Abnormal   Collection Time    07/08/12  8:57 PM      Result Value Range   Glucose-Capillary 297 (*) 70 - 99 mg/dL   Comment 1 Notify RN    GLUCOSE, CAPILLARY     Status: Abnormal   Collection Time    07/09/12  6:06 AM      Result Value Range   Glucose-Capillary 238 (*) 70 - 99 mg/dL   Comment 1 Notify RN    GLUCOSE, CAPILLARY     Status: Abnormal   Collection Time    07/09/12 12:06 PM      Result Value Range   Glucose-Capillary 451 (*) 70 - 99 mg/dL   Comment 1 Documented in Chart     Comment 2 Notify RN    GLUCOSE, CAPILLARY     Status: Abnormal   Collection Time    07/09/12 12:07 PM      Result Value Range   Glucose-Capillary 397 (*) 70 - 99 mg/dL   Comment 1 Documented in Chart     Comment 2 Notify RN    GLUCOSE, CAPILLARY     Status: Abnormal   Collection Time    07/09/12  4:53 PM      Result Value Range   Glucose-Capillary 404 (*) 70 - 99 mg/dL   Comment 1  Documented in Chart      Comment 2 Notify RN    GLUCOSE, CAPILLARY     Status: Abnormal   Collection Time    07/09/12  6:23 PM      Result Value Range   Glucose-Capillary 404 (*) 70 - 99 mg/dL   Comment 1 Documented in Chart     Comment 2 Notify RN    GLUCOSE, CAPILLARY     Status: Abnormal   Collection Time    07/09/12  6:24 PM      Result Value Range   Glucose-Capillary 400 (*) 70 - 99 mg/dL   Comment 1 Documented in Chart     Comment 2 Notify RN    GLUCOSE, CAPILLARY     Status: Abnormal   Collection Time    07/09/12  9:16 PM      Result Value Range   Glucose-Capillary 342 (*) 70 - 99 mg/dL   Comment 1 Notify RN    GLUCOSE, CAPILLARY     Status: Abnormal   Collection Time    07/10/12  6:21 AM      Result Value Range   Glucose-Capillary 207 (*) 70 - 99 mg/dL   Comment 1 Notify RN      Physical Findings: AIMS: Facial and Oral Movements Muscles of Facial Expression: None, normal Lips and Perioral Area: None, normal Jaw: None, normal Tongue: None, normal,Extremity Movements Upper (arms, wrists, hands, fingers): None, normal Lower (legs, knees, ankles, toes): None, normal, Trunk Movements Neck, shoulders, hips: None, normal, Overall Severity Severity of abnormal movements (highest score from questions above): None, normal Incapacitation due to abnormal movements: None, normal Patient's awareness of abnormal movements (rate only patient's report): No Awareness, Dental Status Current problems with teeth and/or dentures?: No Does patient usually wear dentures?: No  CIWA:  CIWA-Ar Total: 0 COWS:     Treatment Plan Summary: Daily contact with patient to assess and evaluate symptoms and progress in treatment Medication management  Plan: Continue Risperidone 1 mg.  Patient has completed Librium protocol.  Will continue current plan and treatment.  Medical Decision Making Problem Points:  Established problem, stable/improving (1), Review of last therapy session (1) and Review of psycho-social  stressors (1) Data Points:  Review or order clinical lab tests (1) Review and summation of old records (2) Review of medication regiment & side effects (2)  I certify that inpatient services furnished can reasonably be expected to improve the patient's condition.   Rankin, Shuvon FNP-BC 07/10/2012, 10:19 AM

## 2012-07-10 NOTE — Progress Notes (Signed)
Inpatient Diabetes Program Recommendations  AACE/ADA: New Consensus Statement on Inpatient Glycemic Control (2013)  Target Ranges:  Prepandial:   less than 140 mg/dL      Peak postprandial:   less than 180 mg/dL (1-2 hours)      Critically ill patients:  140 - 180 mg/dL   Reason for Visit: Consult - Hyperglycemia  Results for RECO, SHONK (MRN [id number]) as of 07/10/2012 13:45  Ref. Range 07/03/2012 19:45  Sodium Latest Range: 135-145 mEq/L 137  Potassium Latest Range: 3.5-5.1 mEq/L 3.6  Chloride Latest Range: 96-112 mEq/L 104  CO2 Latest Range: 19-32 mEq/L 20  BUN Latest Range: 6-23 mg/dL 11  Creatinine Latest Range: 0.50-1.35 mg/dL 8.29  Calcium Latest Range: 8.4-10.5 mg/dL 8.3 (L)  GFR calc non Af Amer Latest Range: >90 mL/min >90  GFR calc Af Amer Latest Range: >90 mL/min >90  Glucose Latest Range: 70-99 mg/dL 562 (H)  Alkaline Phosphatase Latest Range: 39-117 U/L 125 (H)  Albumin Latest Range: 3.5-5.2 g/dL 3.5  AST Latest Range: 0-37 U/L 29  ALT Latest Range: 0-53 U/L 24  Results for MAKELL, CYR (MRN [id number]) as of 07/10/2012 13:45  Ref. Range 07/09/2012 12:06 07/09/2012 12:07 07/09/2012 16:53 07/09/2012 18:23 07/09/2012 18:24 07/09/2012 21:16 07/10/2012 06:21 07/10/2012 11:44 07/10/2012 13:06  Glucose-Capillary Latest Range: 70-99 mg/dL 696 (H) 295 (H) 284 (H) 404 (H) 400 (H) 342 (H) 207 (H) 421 (H) 436 (H)    Inpatient Diabetes Program Recommendations Insulin - Basal: Add Lantus 10 units QHS Correction (SSI): Novolog moderate tidwc and hs Insulin - Meal Coverage: Add Novolog moderate tidwc and hs HgbA1C: 7.9% - uncontrolled Outpatient Referral: Would benefit from OP Diabetes Education Diet: Encourage pt to make healthy choices with moderate portion sizes in the cafeteria BMET today please.  Note: Continue to titrate Lantus and Novolog until CBGs < 180 mg/dL.  Needs prompt f/u with PCP to manage DM. Will follow daily while inpatient.  Thank you. Ailene Ards, RD,  LDN, CDE Inpatient Diabetes Coordinator [phone]

## 2012-07-10 NOTE — Progress Notes (Signed)
Recreation Therapy Notes  Date: 06.16.2014 Time:9:30am  Location: [address] Dayroom  Group Topic/Focus: Stress Managment   Participation Level:  Active   Participation Quality:  Appropriate  Affect:  Euthymic   Cognitive:  Appropriate  Additional Comments: Activity: Progressive Muscle Relaxation ; Explanation: Patient listened to recording of Progressive Muscle Relaxation Script.   Patient actively participated in group activity with the assistance of an interpreter.   Jearl Klinefelter, LRT/ CTRS  [place], Timaya Bojarski L 07/10/2012 10:09 AM

## 2012-07-10 NOTE — Progress Notes (Signed)
D: Pt denies SI/HI/AVH. Pt reports feeling depressed 8/10 d/t posterior headache. Pt is cooperative and engaged on the milieu. Pt has translator present this morning and afternoon. A: medications administered as ordered per MD. Verbal support given. Pt encouraged to attend groups. Pt re-educated about diabetic meals and selections during meal times. 15 minute checks performed for safety. R: Pt is pleasant today. Pt do not appear to be in distress.

## 2012-07-10 NOTE — [facility] Group Notes (Signed)
[facility] LCSW Group Therapy  07/10/2012 1:15 pm  Type of Therapy: Process Group Therapy  Participation Level:  Active  Participation Quality:  Appropriate  Affect:  Flat  Cognitive:  Oriented  Insight:  Limited  Engagement in Group:  Limited  Engagement in Therapy:  Limited  Modes of Intervention:  Activity, Clarification, Education, Problem-solving and Support  Summary of Progress/Problems: Today's group addressed the issue of overcoming obstacles.  Patients were asked to identify their biggest obstacle post d/c that stands in the way of their on-going success, and then problem solve as to how to manage this.  Mitchell [name] states his biggest obstacle is headaches and diabetes.  I challenged him on this, stating I thought he would say his biggest obstacle is not drinking.  He reluctantly agreed, but says he feels confident he will no longer drink.  He just needs to stay farther away from negative people.  Limited insight.  Poor judgment.  Mitchell [name] 07/10/2012   3:15 PM

## 2012-07-10 NOTE — [facility] Group Notes (Signed)
[facility] LCSW Aftercare Discharge Planning Group Note   07/10/2012 3:11 PM  Participation Quality:  Engaged  Mood/Affect:  Depressed  Depression Rating:  5-6  Anxiety Rating:  7  Thoughts of Suicide:  No Will you contract for safety?   NA  Current AVH:  No  Plan for Discharge/Comments:  C/O headache.  States he is not making progress, and that his main need is help with headache.  Also states that his uncle, with whom he resides, is in [place] on a job, and will not be back until next week.  He states that he does not have a key to the house, so is planning on staying here until then.  Told him we need to contact uncle.  Said he does not have the number, but will try to get it.  Transportation Means: family  Supports: family  Mitchell [name], Mitchell [name]

## 2012-07-10 NOTE — Tx Team (Signed)
  Interdisciplinary Treatment Plan Update   Date Reviewed:  07/10/2012  Time Reviewed:  3:19 PM  Progress in Treatment:   Attending groups: Yes Participating in groups: Yes Taking medication as prescribed: Yes  Tolerating medication: Yes Family/Significant other contact made: Yes  Patient understands diagnosis: Yes  Discussing patient identified problems/goals with staff: Yes Medical problems stabilized or resolved: Yes Denies suicidal/homicidal ideation: Yes  In tx team Patient has not harmed self or others: Yes  For review of initial/current patient goals, please see plan of care.  Estimated Length of Stay:  2-3 days  Reason for Continuation of Hospitalization: Anxiety Depression Medication stabilization  New Problems/Goals identified:  N/A  Discharge Plan or Barriers:   return home, follow up outpt  Additional Comments:  Done with librium taper.  C/o headaches, depression and anxiety.  Likely d/c on Wednesday.  Attendees:  Signature: Thedore Mins, MD 07/10/2012 3:19 PM   Signature: Richelle Ito, LCSW 07/10/2012 3:19 PM  Signature:  07/10/2012 3:19 PM  Signature:  07/10/2012 3:19 PM  Signature: Liborio Nixon, RN 07/10/2012 3:19 PM  Signature:  07/10/2012 3:19 PM  Signature:   07/10/2012 3:19 PM  Signature:    Signature:    Signature:    Signature:    Signature:    Signature:      Scribe for Treatment Team:   Richelle Ito, LCSW  07/10/2012 3:19 PM

## 2012-07-10 NOTE — Progress Notes (Signed)
[facility] Group Notes:  (Nursing/MHT/Case Management/Adjunct)  Date:  07/09/2012 Time:  2000  Type of Therapy:  Psychoeducational Skills  Participation Level:  Active  Participation Quality:  Appropriate  Affect:  Blunted  Cognitive:  Appropriate  Insight:  Good  Engagement in Group:  Engaged  Modes of Intervention:  Education  Summary of Progress/Problems: The patient stated that he had a "bad" day. He explained that he felt bad for much of the day since his blood sugars were elevated. He did however have a good visit with his son. No goal was stated for tomorrow.   Hazle Coca S 07/10/2012, 12:25 AM

## 2012-07-10 NOTE — Progress Notes (Signed)
Patient ID: Mitchell [name], male   DOB: May 07, 1975, 37 y.o.   MRN: [id number]  D: Pt was pleasant. Several times prior to the assessment, pt would smile at the writer. Several times the pt would gesture and make a strange sound while smiling. However, during the assessment with the interpretor present, pt became more serious. Pt was concerned about his blood sugar, stated that he gets hungry, referring to eating healthy. Stated, "at home he eats 4 to 5 meals a day". Writer informed pt that she would attempt to find a healthy snack. However, when asked his blood sugars at home pt stated 300's and 400's. Writer gave pt a salad tonight and encouraged better choices at meals. Stated he was given a nutrition plan in the past.   A:  Support and encouragement was offered. 15 min checks continued for safety.  R: Pt remains safe.

## 2012-07-11 DIAGNOSIS — F323 Major depressive disorder, single episode, severe with psychotic features: Secondary | ICD-10-CM

## 2012-07-11 LAB — GLUCOSE, CAPILLARY
Glucose-Capillary: 281 mg/dL — ABNORMAL HIGH (ref 70–99)
Glucose-Capillary: 236 mg/dL — ABNORMAL HIGH (ref 70–99)

## 2012-07-11 MED ORDER — INSULIN GLARGINE 100 UNIT/ML ~~LOC~~ SOLN: 18.0000 [IU] | Freq: Every day | SUBCUTANEOUS | Status: DC

## 2012-07-11 MED ORDER — CITALOPRAM HYDROBROMIDE 40 MG PO TABS: 40.0000 mg | ORAL_TABLET | Freq: Every day | ORAL | Status: DC

## 2012-07-11 MED ORDER — OMEPRAZOLE 20 MG PO CPDR: 20.0000 mg | DELAYED_RELEASE_CAPSULE | Freq: Every day | ORAL | Status: DC

## 2012-07-11 MED ORDER — TRAZODONE HCL 50 MG PO TABS: 50.0000 mg | ORAL_TABLET | Freq: Every evening | ORAL | Status: DC | PRN

## 2012-07-11 MED ORDER — INSULIN ASPART 100 UNIT/ML ~~LOC~~ SOLN: 6.0000 [IU] | Freq: Three times a day (TID) | SUBCUTANEOUS | Status: DC

## 2012-07-11 MED ORDER — METFORMIN HCL 1000 MG PO TABS: 1000.0000 mg | ORAL_TABLET | Freq: Two times a day (BID) | ORAL | Status: DC

## 2012-07-11 MED ORDER — RISPERIDONE 1 MG PO TABS: 1.0000 mg | ORAL_TABLET | Freq: Every day | ORAL | Status: DC

## 2012-07-11 MED ORDER — ADULT MULTIVITAMIN W/MINERALS CH: 1.0000 | ORAL_TABLET | Freq: Every day | ORAL | Status: AC

## 2012-07-11 MED ORDER — GLIMEPIRIDE 4 MG PO TABS: 4.0000 mg | ORAL_TABLET | Freq: Every day | ORAL | Status: DC
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
## 2012-07-11 NOTE — Progress Notes (Signed)
Adult Psychoeducational Group Note  Date:  07/11/2012 Time:  10:18 AM  Group Topic/Focus:  Recovery Goals:   The focus of this group is to identify appropriate goals for recovery and establish a plan to achieve them.  Participation Level:  Active  Participation Quality:  Appropriate  Affect:  Appropriate  Cognitive:  Appropriate  Insight: Appropriate  Engagement in Group:  Engaged  Modes of Intervention:  Discussion  Additional Comments:  Pt stated eating better and stop drinking as his most important goals. He will begin to document what he eats, and plans to attend groups outside of [facility] for his drinking.   Tora Perches N 07/11/2012, 10:18 AM

## 2012-07-11 NOTE — Progress Notes (Signed)
Inpatient Diabetes Program Recommendations  AACE/ADA: New Consensus Statement on Inpatient Glycemic Control (2013)  Target Ranges:  Prepandial:   less than 140 mg/dL      Peak postprandial:   less than 180 mg/dL (1-2 hours)      Critically ill patients:  140 - 180 mg/dL   Reason for Visit: Hyperglycemia  Results for JERMALL, ISAACSON (MRN [id number]) as of 07/11/2012 09:47  Ref. Range 07/10/2012 11:44 07/10/2012 13:06 07/10/2012 14:47 07/10/2012 17:02 07/10/2012 21:36  Glucose-Capillary Latest Range: 70-99 mg/dL 409 (H) 811 (H) 914 (H) 335 (H) 281 (H)  Results for TRAVION, KE (MRN [id number]) as of 07/11/2012 09:47  Ref. Range 07/10/2012 19:35  Sodium Latest Range: 135-145 mEq/L 135  Potassium Latest Range: 3.5-5.1 mEq/L 3.7  Chloride Latest Range: 96-112 mEq/L 97  CO2 Latest Range: 19-32 mEq/L 26  BUN Latest Range: 6-23 mg/dL 9  Creatinine Latest Range: 0.50-1.35 mg/dL 0.86  Calcium Latest Range: 8.4-10.5 mg/dL 9.9  GFR calc non Af Amer Latest Range: >90 mL/min >90  GFR calc Af Amer Latest Range: >90 mL/min >90  Glucose Latest Range: 70-99 mg/dL 578 (H)   Recommendations: Increase Lantus to 18 units QHS Increase Novolog to 6 units tidwc for meal coverage insulin. If pt to be discharged on insulin, will need RN to begin teaching insulin administration. Will need appt with PCP to manage DM.  Thank you. Ailene Ards, RD, LDN, CDE Inpatient Diabetes Coordinator [phone]

## 2012-07-11 NOTE — Progress Notes (Signed)
Discharged note: Pt discharged to self. Pt received verbal and written discharge instructions. Pt agreed to f/u appt and med regimen. Pt received education on how to properly draw up insulin and how to self inject insulin by watching a video and watching Clinical research associate demonstrate.  Pt understanding demonstrated by return demonstration to Clinical research associate. Pt was given sample meds, syringes and alcohol wipes. Pt was also given information to f/u with Dr. Laural Benes over at [facility] as recommended  by Dietician. Pt agreed to call. Pt informed that he would need to obtain his own glucose meter and strips in order to monitor his blood sugar level. Pt receptive to treatment. Pt denies SI/HI/AVH. Pt received all belongings from locker and room.   Writer attempted three times to set up follow up appt with [facility] for pt. No answer.

## 2012-07-11 NOTE — Progress Notes (Signed)
Seen and agreed. Shogo Larkey, MD 

## 2012-07-11 NOTE — Progress Notes (Signed)
[facility] Adult Case Management Discharge Plan :  Will you be returning to the same living situation after discharge: No. At discharge, do you have transportation home?:Yes,  friend Do you have the ability to pay for your medications:Yes,  mental health  Release of information consent forms completed and in the chart;  Patient's signature needed at discharge.  Patient to Follow up at: Follow-up Information   Follow up with [facility].   Contact information:   [address]  [place]  [phone]      Patient denies SI/HI:   Yes,  yes    Safety Planning and Suicide Prevention discussed:  Yes,  yes  Ida Rogue 07/11/2012, 12:36 PM

## 2012-07-11 NOTE — Tx Team (Signed)
  Interdisciplinary Treatment Plan Update   Date Reviewed:  07/11/2012  Time Reviewed:  12:44 PM  Progress in Treatment:   Attending groups: Yes Participating in groups: Yes Taking medication as prescribed: Yes  Tolerating medication: Yes Family/Significant other contact made: Yes  Patient understands diagnosis: Yes  Discussing patient identified problems/goals with staff: Yes Medical problems stabilized or resolved: Yes Denies suicidal/homicidal ideation: Yes  In tx team Patient has not harmed self or others: Yes  For review of initial/current patient goals, please see plan of care.  Estimated Length of Stay:  D/C today  Reason for Continuation of Hospitalization:   New Problems/Goals identified:  N/A  Discharge Plan or Barriers:   stay with friend, follow up mental health and AA mtgs  Additional Comments:  Attendees:  Signature: Thedore Mins, MD 07/11/2012 12:44 PM   Signature: Richelle Ito, LCSW 07/11/2012 12:44 PM  Signature:  07/11/2012 12:44 PM  Signature: 07/11/2012 12:44 PM  Signature: Liborio Nixon, RN 07/11/2012 12:44 PM  Signature:  07/11/2012 12:44 PM  Signature:   07/11/2012 12:44 PM  Signature:    Signature:    Signature:    Signature:    Signature:    Signature:      Scribe for Treatment Team:   Richelle Ito, LCSW  07/11/2012 12:44 PM

## 2012-07-11 NOTE — Progress Notes (Signed)
Inpatient Diabetes Program Recommendations  AACE/ADA: New Consensus Statement on Inpatient Glycemic Control (2013)  Target Ranges:  Prepandial:   less than 140 mg/dL      Peak postprandial:   less than 180 mg/dL (1-2 hours)      Critically ill patients:  140 - 180 mg/dL   Reason for Assessment:  Hyperglycemia  Results for JOUSHA, SCHWANDT (MRN [id number]) as of 07/11/2012 16:20  Ref. Range 07/10/2012 13:06 07/10/2012 14:47 07/10/2012 17:02 07/10/2012 21:36 07/11/2012 11:48  Glucose-Capillary Latest Range: 70-99 mg/dL 409 (H) 811 (H) 914 (H) 281 (H) 236 (H)   Blood sugars improving with basal / bolus therapy.  Pt to be discharged to home.  RN paged to ask assistance with PCP f/u and meter assistance.  Writer informed RN to request case management to assist with scheduling OP appt with Dr. Standley Dakins at [facility] [phone]) to f/u for diabetes management.    Recommend Relion meter and strips at [facility] as most economical glucose meter. Please give Exit Care Notes re: Monitoring, Insulin Injections, Hypoglycemia, and CHO counting. Will order OP Diabetes Education at [facility] and they will call to set up appt.  Thank you. Ailene Ards, RD, LDN, CDE Inpatient Diabetes Coordinator [phone]
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
## 2012-07-11 NOTE — Progress Notes (Signed)
Patient ID: Mitchell [name], male   DOB: Apr 13, 1975, 37 y.o.   MRN: [id number] [facility] MD Progress Note  07/11/2012 9:50 AM Conlee Sliter  MRN:  [id number] Subjective: "I am doing much better except for occasional headache". Objective: Patient speaks limited english and history is obtained from him through a language resource person. He reports decreased anxiety and depressive symptoms. Denies delusions and psychosis and has no medical adverse reactions to his medication. Diagnosis: MDD-recurrent episode severe with psychosis                    Alcohol dependence/withdrawal   ADL's:  Intact  Sleep: Fair  Appetite:  Fair  Suicidal Ideation: yes Plan:  denies Intent:  denies Means:  denies Homicidal Ideation:  Plan:  denies Intent:  denies Means:  denies AEB (as evidenced by):  Psychiatric Specialty Exam: Review of Systems  Respiratory: Negative.   Cardiovascular: Negative.   Genitourinary: Negative.   Skin: Negative.   Neurological: Positive for tremors and headaches.  Endo/Heme/Allergies: Negative.   Psychiatric/Behavioral: The patient is nervous/anxious.     Blood pressure 137/85, pulse 117, temperature 99.1 F (37.3 C), temperature source Oral, resp. rate 20, height 5' 2.6" (1.59 m), weight 77.111 kg (170 lb).Body mass index is 30.5 kg/(m^2).  General Appearance: Fairly Groomed  Patent attorney::  Minimal  Speech:  Clear and Coherent  Volume:  Decreased  Mood:  Anxious and Depressed  Affect:  Blunt  Thought Process:  Goal Directed  Orientation:  Full (Time, Place, and Person)  Thought Content:  Hallucinations: Auditory  Suicidal Thoughts:  Yes.  without intent/plan  Homicidal Thoughts:  No  Memory:  Immediate;   Fair Recent;   Fair Remote;   Fair  Judgement:  Poor  Insight:  Lacking  Psychomotor Activity:  Decreased  Concentration:  Fair  Recall:  Fair  Akathisia:  No  Handed:  Right  AIMS (if indicated):     Assets:  Communication Skills Desire for Improvement   Sleep:  Number of Hours: 5.5   Current Medications: Current Facility-Administered Medications  Medication Dose Route Frequency Provider Last Rate Last Dose  . alum & mag hydroxide-simeth (MAALOX/MYLANTA) 200-200-20 MG/5ML suspension 30 mL  30 mL Oral Q4H PRN Shuvon Rankin, NP      . chlordiazePOXIDE (LIBRIUM) capsule 25 mg  25 mg Oral Once Shuvon Rankin, NP      . citalopram (CELEXA) tablet 40 mg  40 mg Oral Daily Shuvon Rankin, NP   40 mg at 07/11/12 0806  . feeding supplement (GLUCERNA SHAKE) liquid 237 mL  237 mL Oral TID BM Larena Sox, MD   237 mL at 07/10/12 2006  . glimepiride (AMARYL) tablet 4 mg  4 mg Oral QAC breakfast Shuvon Rankin, NP   4 mg at 07/11/12 8657  . insulin aspart (novoLOG) injection 0-15 Units  0-15 Units Subcutaneous TID WC Kerry Hough, PA-C   8 Units at 07/11/12 0640  . insulin aspart (novoLOG) injection 0-5 Units  0-5 Units Subcutaneous QHS Kerry Hough, PA-C   3 Units at 07/10/12 2147  . insulin aspart (novoLOG) injection 3 Units  3 Units Subcutaneous TID WC Fransisca Kaufmann, NP   3 Units at 07/11/12 0640  . insulin glargine (LANTUS) injection 10 Units  10 Units Subcutaneous QHS Fransisca Kaufmann, NP   10 Units at 07/10/12 2147  . magnesium hydroxide (MILK OF MAGNESIA) suspension 30 mL  30 mL Oral Daily PRN Shuvon Rankin, NP      .  metFORMIN (GLUCOPHAGE) tablet 1,000 mg  1,000 mg Oral BID WC Shuvon Rankin, NP   1,000 mg at 07/11/12 0806  . multivitamin with minerals tablet 1 tablet  1 tablet Oral Daily Shuvon Rankin, NP   1 tablet at 07/11/12 0806  . naproxen (NAPROSYN) tablet 500 mg  500 mg Oral BID WC Azharia Surratt   500 mg at 07/11/12 0806  . pantoprazole (PROTONIX) EC tablet 40 mg  40 mg Oral Daily Sanjuana Kava, NP   40 mg at 07/11/12 0806  . risperiDONE (RISPERDAL) tablet 1 mg  1 mg Oral QHS Janene Yousuf   1 mg at 07/10/12 2253  . SUMAtriptan (IMITREX) tablet 50 mg  50 mg Oral Q2H PRN Fransisca Kaufmann, NP   50 mg at 07/11/12 4540  . thiamine (B-1) injection  100 mg  100 mg Intramuscular Once Shuvon Rankin, NP      . thiamine (VITAMIN B-1) tablet 100 mg  100 mg Oral Daily Shuvon Rankin, NP   100 mg at 07/11/12 0806  . traZODone (DESYREL) tablet 50 mg  50 mg Oral QHS,MR X 1 Kerry Hough, PA-C   50 mg at 07/10/12 2254    Lab Results:  Results for orders placed during the hospital encounter of 07/04/12 (from the past 48 hour(s))  GLUCOSE, CAPILLARY     Status: Abnormal   Collection Time    07/09/12 12:06 PM      Result Value Range   Glucose-Capillary 451 (*) 70 - 99 mg/dL   Comment 1 Documented in Chart     Comment 2 Notify RN    GLUCOSE, CAPILLARY     Status: Abnormal   Collection Time    07/09/12 12:07 PM      Result Value Range   Glucose-Capillary 397 (*) 70 - 99 mg/dL   Comment 1 Documented in Chart     Comment 2 Notify RN    GLUCOSE, CAPILLARY     Status: Abnormal   Collection Time    07/09/12  4:53 PM      Result Value Range   Glucose-Capillary 404 (*) 70 - 99 mg/dL   Comment 1 Documented in Chart     Comment 2 Notify RN    GLUCOSE, CAPILLARY     Status: Abnormal   Collection Time    07/09/12  6:23 PM      Result Value Range   Glucose-Capillary 404 (*) 70 - 99 mg/dL   Comment 1 Documented in Chart     Comment 2 Notify RN    GLUCOSE, CAPILLARY     Status: Abnormal   Collection Time    07/09/12  6:24 PM      Result Value Range   Glucose-Capillary 400 (*) 70 - 99 mg/dL   Comment 1 Documented in Chart     Comment 2 Notify RN    GLUCOSE, CAPILLARY     Status: Abnormal   Collection Time    07/09/12  9:16 PM      Result Value Range   Glucose-Capillary 342 (*) 70 - 99 mg/dL   Comment 1 Notify RN    GLUCOSE, CAPILLARY     Status: Abnormal   Collection Time    07/10/12  6:21 AM      Result Value Range   Glucose-Capillary 207 (*) 70 - 99 mg/dL   Comment 1 Notify RN    GLUCOSE, CAPILLARY     Status: Abnormal   Collection Time    07/10/12 11:44 AM  Result Value Range   Glucose-Capillary 421 (*) 70 - 99 mg/dL    Comment 1 Documented in Chart     Comment 2 Notify RN    GLUCOSE, CAPILLARY     Status: Abnormal   Collection Time    07/10/12  1:06 PM      Result Value Range   Glucose-Capillary 436 (*) 70 - 99 mg/dL  GLUCOSE, CAPILLARY     Status: Abnormal   Collection Time    07/10/12  2:47 PM      Result Value Range   Glucose-Capillary 407 (*) 70 - 99 mg/dL   Comment 1 Documented in Chart     Comment 2 Notify RN    GLUCOSE, CAPILLARY     Status: Abnormal   Collection Time    07/10/12  5:02 PM      Result Value Range   Glucose-Capillary 335 (*) 70 - 99 mg/dL   Comment 1 Documented in Chart     Comment 2 Notify RN    BASIC METABOLIC PANEL     Status: Abnormal   Collection Time    07/10/12  7:35 PM      Result Value Range   Sodium 135  135 - 145 mEq/L   Potassium 3.7  3.5 - 5.1 mEq/L   Chloride 97  96 - 112 mEq/L   CO2 26  19 - 32 mEq/L   Glucose, Bld 302 (*) 70 - 99 mg/dL   BUN 9  6 - 23 mg/dL   Creatinine, Ser 1.61  0.50 - 1.35 mg/dL   Calcium 9.9  8.4 - 09.6 mg/dL   GFR calc non Af Amer >90  >90 mL/min   GFR calc Af Amer >90  >90 mL/min   Comment:            The eGFR has been calculated     using the CKD EPI equation.     This calculation has not been     validated in all clinical     situations.     eGFR's persistently     <90 mL/min signify     possible Chronic Kidney Disease.  GLUCOSE, CAPILLARY     Status: Abnormal   Collection Time    07/10/12  9:36 PM      Result Value Range   Glucose-Capillary 281 (*) 70 - 99 mg/dL    Physical Findings: AIMS: Facial and Oral Movements Muscles of Facial Expression: None, normal Lips and Perioral Area: None, normal Jaw: None, normal Tongue: None, normal,Extremity Movements Upper (arms, wrists, hands, fingers): None, normal Lower (legs, knees, ankles, toes): None, normal, Trunk Movements Neck, shoulders, hips: None, normal, Overall Severity Severity of abnormal movements (highest score from questions above): None,  normal Incapacitation due to abnormal movements: None, normal Patient's awareness of abnormal movements (rate only patient's report): No Awareness, Dental Status Current problems with teeth and/or dentures?: No Does patient usually wear dentures?: No  CIWA:  CIWA-Ar Total: 0 COWS:     Treatment Plan Summary: Daily contact with patient to assess and evaluate symptoms and progress in treatment Medication management  Plan:1. Admit for crisis management and stabilization. 2. Medication management to reduce current symptoms to base line and improve the     patient's overall level of functioning 3. Treat health problems as indicated. 4. Develop treatment plan to decrease risk of relapse upon discharge and the need for     readmission. 5. Psycho-social education regarding relapse prevention and self care. 6. Health  care follow up as needed for medical problems. 7. Restart home medications where appropriate. 8. Continue Naprosyn to 500mg  po BID with food for Headache 9. Continue other medication management.  Medical Decision Making Problem Points:  Established problem, stable/improving (1), Review of last therapy session (1) and Review of psycho-social stressors (1) Data Points:  Order Aims Assessment (2) Review of medication regiment & side effects (2) Review of new medications or change in dosage (2)  I certify that inpatient services furnished can reasonably be expected to improve the patient's condition.   Tennyson Kallen,MD 07/11/2012, 9:50 AM
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
## 2012-07-12 LAB — GLUCOSE, CAPILLARY: Glucose-Capillary: 288 mg/dL — ABNORMAL HIGH (ref 70–99)

## 2012-07-12 NOTE — Progress Notes (Signed)
Patient Discharge Instructions:  After Visit Summary (AVS):   Faxed to:  07/12/12 Discharge Summary Note:   Faxed to:  07/12/12 Psychiatric Admission Assessment Note:   Faxed to:  07/12/12 Suicide Risk Assessment - Discharge Assessment:   Faxed to:  07/12/12 Faxed/Sent to the Next Level Care provider:  07/12/12 Faxed to [facility] @ [phone]   Jerelene Redden, 07/12/2012, 4:10 PM

## 2012-07-13 ENCOUNTER — Ambulatory Visit (INDEPENDENT_AMBULATORY_CARE_PROVIDER_SITE_OTHER): Payer: No Typology Code available for payment source | Admitting: Family Medicine

## 2012-07-13 VITALS — BP 106/70 | HR 82 | Temp 98.1°F | Resp 18 | Ht 64.0 in | Wt 180.0 lb

## 2012-07-13 DIAGNOSIS — E118 Type 2 diabetes mellitus with unspecified complications: Secondary | ICD-10-CM

## 2012-07-13 DIAGNOSIS — L03119 Cellulitis of unspecified part of limb: Secondary | ICD-10-CM

## 2012-07-13 DIAGNOSIS — L03031 Cellulitis of right toe: Secondary | ICD-10-CM | POA: Insufficient documentation

## 2012-07-13 DIAGNOSIS — L0291 Cutaneous abscess, unspecified: Secondary | ICD-10-CM

## 2012-07-13 DIAGNOSIS — L02419 Cutaneous abscess of limb, unspecified: Secondary | ICD-10-CM

## 2012-07-13 DIAGNOSIS — L02611 Cutaneous abscess of right foot: Secondary | ICD-10-CM

## 2012-07-13 DIAGNOSIS — L0390 Cellulitis, unspecified: Secondary | ICD-10-CM

## 2012-07-13 HISTORY — DX: Cellulitis of right toe: L03.031

## 2012-07-13 HISTORY — DX: Cutaneous abscess of right foot: L02.611

## 2012-07-13 LAB — GLUCOSE, CAPILLARY: Glucose-Capillary: 242 mg/dL — ABNORMAL HIGH (ref 70–99)

## 2012-07-13 MED ORDER — CEFTRIAXONE SODIUM 1 G IJ SOLR
1.0000 g | Freq: Once | INTRAMUSCULAR | Status: AC
  Administered 2012-07-13: 1 g via INTRAMUSCULAR

## 2012-07-13 MED ORDER — SULFAMETHOXAZOLE-TRIMETHOPRIM 800-160 MG PO TABS: 1.0000 | ORAL_TABLET | Freq: Two times a day (BID) | ORAL | Status: DC

## 2012-07-13 NOTE — Assessment & Plan Note (Signed)
Hyperglycemia appears new in past few weeks. He has insulin at home to continue. Will not make adjustments to chronic regimen since I suspect will improve with infection treatment, and level is actually better today than recent measurements. F/u in 1 day.

## 2012-07-13 NOTE — Assessment & Plan Note (Signed)
Appears c/w a recurrence of cellulitis for past 3 days. No systemic signs currently. Given rocephin 1 gm IM in clinic and sent bactrim rx to start as well. Advised to follow up for re-exam tomorrow, I have drawn a line surrounding the faint borders of streaking to assess response. He is given red flags to seek emergency care.

## 2012-07-13 NOTE — Progress Notes (Signed)
  Subjective:    Patient ID: Mitchell [name], male    DOB: September 09, 1975, 37 y.o.   MRN: [id number]  HPI phone interpretor utilized for interview.  37 yo male with history of recurrent right leg infections/ulcers, DM2 on insulin and mood disorder presents acutely with right leg pain, swelling and redness. He states it started on Monday, the same day he was discharged from behavioral health. He reports having uncontrolled CBGs from 300-400 for past several weeks even with compliance on insulin. He is having some chills, but denies fever, nausea, emesis, lightheadedness.   Review of Systems See HPI otherwise negative.  reports that he has never smoked. He has never used smokeless tobacco.     Objective:   Physical Exam  Vitals reviewed. Constitutional: He is oriented to person, place, and time. He appears well-developed and well-nourished. No distress.  HENT:  Head: Normocephalic and atraumatic.  Mouth/Throat: Oropharynx is clear and moist.  Cardiovascular: Normal rate, regular rhythm and normal heart sounds.   No murmur heard. Pulmonary/Chest: Effort normal and breath sounds normal.  Musculoskeletal: He exhibits edema and tenderness.  Right LE has some scaring and hyperpigmentation to distal leg and foot that appear chronic. New area of involvement is proximal to his scar, anterior shin exhibits about 4x 4 cm area of erythema, mild pitting edema and tenderness. There is some faint streaking upwards on leg. Smaller area of petechia scattered anteriorly.   No pus or induration, Skin openings or ulcers detected.  Neurological: He is alert and oriented to person, place, and time.  Skin: He is not diaphoretic.  Psychiatric: He has a normal mood and affect.        Assessment & Plan:

## 2012-07-13 NOTE — Progress Notes (Signed)
Pt walk in to nurse office this morning reporting possible infection of left lower leg - ankle purple , red in color redness streaking up calf and front of leg. Pt states it started 2 days ago & is diabetic. Placed in sub waiting  - CMA - Alfredia Client and Jazmin informed. Wyatt Haste, RN-BSN

## 2012-07-13 NOTE — Patient Instructions (Addendum)
Start taking bactrim today, twice daily. Make an appointment for tomorrow. If you have vomiting, high fever or worsens overnight then go to the emergency room.   Comience a tomar bactrim hoy, dos veces al [employer].  Haga una cita para maana.  Si ha vmitos, fiebre alta o empeora durante la noche y luego ir a la sala de Sports administrator.  Celulitis (Celulitis)  La celulitis es una infeccin de la piel y del tejido que se encuentra debajo de la piel. El rea infectada generalmente est de color rojo y duele. Ocurre con ms frecuencia en los brazos y en las piernas. CUIDADOS EN EL HOGAR   Tome los antibiticos tal como se le indic. Finalice el [place], aunque comience a [employer].  Mantenga el brazo o la pierna infectada elevada.  Aplique paos calientes en la zona hasta 4 veces al da.  Tome slo los medicamentos que le haya indicado el mdico.  Cumpla con los controles mdicos segn las indicaciones. SOLICITE AYUDA DE INMEDIATO SI:   Tiene fiebre.  Se siente muy somnoliento.  Vomita o tiene diarrea.  Se siente enfermo y tiene [employer].  Observa una lnea roja en la piel que sale desde la herida.  El rea roja se extiende o se vuelve de color oscuro.  El hueso o la articulacin que se encuentran por debajo de la zona infectada le duelen despus de que la piel se [place].  La infeccin se repite en la misma zona o en una zona diferente.  Tiene un bulto inflamado (hinchado) en la zona infectada.  Aparecen nuevos sntomas. ASEGRESE DE QUE:   Comprende estas instrucciones.  Controlar su enfermedad.  Solicitar ayuda de inmediato si no mejora o si empeora. Document Released: 07/01/2009 Document Revised: 07/13/2011 [facility] Patient Information 2014 [place], [place].

## 2012-07-14 ENCOUNTER — Inpatient Hospital Stay (HOSPITAL_COMMUNITY): Payer: Medicaid Other

## 2012-07-14 ENCOUNTER — Emergency Department (HOSPITAL_COMMUNITY): Payer: Medicaid Other

## 2012-07-14 ENCOUNTER — Encounter (HOSPITAL_COMMUNITY): Payer: Self-pay

## 2012-07-14 ENCOUNTER — Ambulatory Visit: Payer: Self-pay

## 2012-07-14 ENCOUNTER — Inpatient Hospital Stay (HOSPITAL_COMMUNITY)
Admission: EM | Admit: 2012-07-14 | Discharge: 2012-07-18 | DRG: 603 | Disposition: A | Discharge status: Home / Self Care | Payer: Medicaid Other | Attending: Family Medicine | Admitting: Family Medicine

## 2012-07-14 DIAGNOSIS — F1020 Alcohol dependence, uncomplicated: Secondary | ICD-10-CM | POA: Diagnosis present

## 2012-07-14 DIAGNOSIS — D696 Thrombocytopenia, unspecified: Secondary | ICD-10-CM | POA: Diagnosis present

## 2012-07-14 DIAGNOSIS — L02419 Cutaneous abscess of limb, unspecified: Principal | ICD-10-CM | POA: Diagnosis present

## 2012-07-14 DIAGNOSIS — R0602 Shortness of breath: Secondary | ICD-10-CM | POA: Diagnosis present

## 2012-07-14 DIAGNOSIS — F329 Major depressive disorder, single episode, unspecified: Secondary | ICD-10-CM

## 2012-07-14 DIAGNOSIS — K219 Gastro-esophageal reflux disease without esophagitis: Secondary | ICD-10-CM | POA: Diagnosis present

## 2012-07-14 DIAGNOSIS — L03116 Cellulitis of left lower limb: Secondary | ICD-10-CM

## 2012-07-14 DIAGNOSIS — E876 Hypokalemia: Secondary | ICD-10-CM | POA: Diagnosis present

## 2012-07-14 DIAGNOSIS — M869 Osteomyelitis, unspecified: Secondary | ICD-10-CM | POA: Diagnosis present

## 2012-07-14 DIAGNOSIS — L03119 Cellulitis of unspecified part of limb: Secondary | ICD-10-CM

## 2012-07-14 DIAGNOSIS — Z833 Family history of diabetes mellitus: Secondary | ICD-10-CM

## 2012-07-14 DIAGNOSIS — L02611 Cutaneous abscess of right foot: Secondary | ICD-10-CM | POA: Diagnosis present

## 2012-07-14 DIAGNOSIS — Z794 Long term (current) use of insulin: Secondary | ICD-10-CM

## 2012-07-14 DIAGNOSIS — E118 Type 2 diabetes mellitus with unspecified complications: Secondary | ICD-10-CM | POA: Diagnosis present

## 2012-07-14 DIAGNOSIS — F321 Major depressive disorder, single episode, moderate: Secondary | ICD-10-CM | POA: Diagnosis present

## 2012-07-14 DIAGNOSIS — F1994 Other psychoactive substance use, unspecified with psychoactive substance-induced mood disorder: Secondary | ICD-10-CM | POA: Diagnosis present

## 2012-07-14 DIAGNOSIS — S98139A Complete traumatic amputation of one unspecified lesser toe, initial encounter: Secondary | ICD-10-CM

## 2012-07-14 LAB — BASIC METABOLIC PANEL
BUN: 10 mg/dL (ref 6–23)
CO2: 21 mEq/L (ref 19–32)
Calcium: 8.5 mg/dL (ref 8.4–10.5)
Creatinine, Ser: 0.94 mg/dL (ref 0.50–1.35)
GFR calc non Af Amer: >90 mL/min (ref >90)
Glucose, Bld: 221 mg/dL — ABNORMAL HIGH (ref 70–99)

## 2012-07-14 LAB — CBC WITH DIFFERENTIAL/PLATELET
Basophils Relative: 0 % (ref 0–1)
Eosinophils Absolute: 0.1 10*3/uL (ref 0.0–0.7)
Eosinophils Relative: 1 % (ref 0–5)
HCT: 32.2 % — ABNORMAL LOW (ref 39.0–52.0)
Hemoglobin: 11.8 g/dL — ABNORMAL LOW (ref 13.0–17.0)
Lymphs Abs: 1.7 10*3/uL (ref 0.7–4.0)
MCH: 31.6 pg (ref 26.0–34.0)
MCHC: 36.6 g/dL — ABNORMAL HIGH (ref 30.0–36.0)
MCV: 86.1 fL (ref 78.0–100.0)
Monocytes Absolute: 0.9 10*3/uL (ref 0.1–1.0)
Neutro Abs: 4.1 10*3/uL (ref 1.7–7.7)
Neutrophils Relative %: 61 % (ref 43–77)
RBC: 3.74 MIL/uL — ABNORMAL LOW (ref 4.22–5.81)

## 2012-07-14 LAB — GLUCOSE, CAPILLARY

## 2012-07-14 MED ORDER — THIAMINE HCL 100 MG/ML IJ SOLN
100.0000 mg | Freq: Every day | INTRAMUSCULAR | Status: DC
  Filled 2012-07-14 (×3): qty 1

## 2012-07-14 MED ORDER — ENOXAPARIN SODIUM 40 MG/0.4ML ~~LOC~~ SOLN
40.0000 mg | SUBCUTANEOUS | Status: DC
  Administered 2012-07-14 – 2012-07-18 (×5): 40 mg via SUBCUTANEOUS
  Filled 2012-07-14 (×5): qty 0.4

## 2012-07-14 MED ORDER — ONDANSETRON HCL 4 MG/2ML IJ SOLN
4.0000 mg | Freq: Four times a day (QID) | INTRAMUSCULAR | Status: DC | PRN
  Administered 2012-07-17: 4 mg via INTRAVENOUS
  Filled 2012-07-14: qty 2

## 2012-07-14 MED ORDER — OXYCODONE HCL 5 MG PO TABS
5.0000 mg | ORAL_TABLET | ORAL | Status: DC | PRN
  Administered 2012-07-14 – 2012-07-17 (×10): 5 mg via ORAL
  Filled 2012-07-14 (×10): qty 1

## 2012-07-14 MED ORDER — SODIUM CHLORIDE 0.9 % IV BOLUS (SEPSIS)
1000.0000 mL | Freq: Once | INTRAVENOUS | Status: AC
  Administered 2012-07-14: 1000 mL via INTRAVENOUS

## 2012-07-14 MED ORDER — MORPHINE SULFATE 4 MG/ML IJ SOLN
4.0000 mg | Freq: Once | INTRAMUSCULAR | Status: AC
  Administered 2012-07-14: 4 mg via INTRAVENOUS
  Filled 2012-07-14: qty 1

## 2012-07-14 MED ORDER — PANTOPRAZOLE SODIUM 40 MG PO TBEC
40.0000 mg | DELAYED_RELEASE_TABLET | Freq: Every day | ORAL | Status: DC
  Administered 2012-07-14 – 2012-07-18 (×5): 40 mg via ORAL
  Filled 2012-07-14 (×5): qty 1

## 2012-07-14 MED ORDER — SODIUM CHLORIDE 0.9 % IV SOLN
1250.0000 mg | Freq: Two times a day (BID) | INTRAVENOUS | Status: DC
  Administered 2012-07-14 – 2012-07-15 (×2): 1250 mg via INTRAVENOUS
  Filled 2012-07-14 (×3): qty 1250

## 2012-07-14 MED ORDER — TRAZODONE HCL 50 MG PO TABS
50.0000 mg | ORAL_TABLET | Freq: Every evening | ORAL | Status: DC | PRN
  Administered 2012-07-14 – 2012-07-17 (×4): 50 mg via ORAL
  Filled 2012-07-14 (×10): qty 1

## 2012-07-14 MED ORDER — ADULT MULTIVITAMIN W/MINERALS CH
1.0000 | ORAL_TABLET | Freq: Every day | ORAL | Status: DC
  Administered 2012-07-14 – 2012-07-18 (×5): 1 via ORAL
  Filled 2012-07-14 (×5): qty 1

## 2012-07-14 MED ORDER — GLIMEPIRIDE 4 MG PO TABS
4.0000 mg | ORAL_TABLET | Freq: Every day | ORAL | Status: DC
  Administered 2012-07-15 – 2012-07-17 (×3): 4 mg via ORAL
  Filled 2012-07-14 (×6): qty 1

## 2012-07-14 MED ORDER — LORAZEPAM 1 MG PO TABS: 1.0000 mg | ORAL_TABLET | Freq: Four times a day (QID) | ORAL | Status: AC | PRN

## 2012-07-14 MED ORDER — INSULIN ASPART 100 UNIT/ML ~~LOC~~ SOLN
0.0000 [IU] | Freq: Three times a day (TID) | SUBCUTANEOUS | Status: DC
  Administered 2012-07-14: 2 [IU] via SUBCUTANEOUS
  Administered 2012-07-15: 5 [IU] via SUBCUTANEOUS
  Administered 2012-07-15: 2 [IU] via SUBCUTANEOUS
  Administered 2012-07-16: 3 [IU] via SUBCUTANEOUS
  Administered 2012-07-16: 2 [IU] via SUBCUTANEOUS
  Administered 2012-07-17 – 2012-07-18 (×3): 3 [IU] via SUBCUTANEOUS

## 2012-07-14 MED ORDER — INSULIN ASPART 100 UNIT/ML ~~LOC~~ SOLN
6.0000 [IU] | Freq: Three times a day (TID) | SUBCUTANEOUS | Status: DC
  Administered 2012-07-14 – 2012-07-18 (×6): 6 [IU] via SUBCUTANEOUS

## 2012-07-14 MED ORDER — ACETAMINOPHEN 325 MG PO TABS: 650.0000 mg | ORAL_TABLET | Freq: Four times a day (QID) | ORAL | Status: DC | PRN

## 2012-07-14 MED ORDER — CITALOPRAM HYDROBROMIDE 40 MG PO TABS
40.0000 mg | ORAL_TABLET | Freq: Every day | ORAL | Status: DC
  Administered 2012-07-14 – 2012-07-18 (×5): 40 mg via ORAL
  Filled 2012-07-14 (×5): qty 1

## 2012-07-14 MED ORDER — FOLIC ACID 1 MG PO TABS
1.0000 mg | ORAL_TABLET | Freq: Every day | ORAL | Status: DC
  Administered 2012-07-14 – 2012-07-18 (×5): 1 mg via ORAL
  Filled 2012-07-14 (×5): qty 1

## 2012-07-14 MED ORDER — ONDANSETRON HCL 4 MG/2ML IJ SOLN
4.0000 mg | Freq: Once | INTRAMUSCULAR | Status: AC
  Administered 2012-07-14: 4 mg via INTRAVENOUS
  Filled 2012-07-14: qty 2

## 2012-07-14 MED ORDER — SODIUM CHLORIDE 0.9 % IV SOLN
INTRAVENOUS | Status: DC
  Administered 2012-07-14: 10 mL/h via INTRAVENOUS

## 2012-07-14 MED ORDER — VITAMIN B-1 100 MG PO TABS
100.0000 mg | ORAL_TABLET | Freq: Every day | ORAL | Status: DC
  Administered 2012-07-14 – 2012-07-18 (×5): 100 mg via ORAL
  Filled 2012-07-14 (×5): qty 1

## 2012-07-14 MED ORDER — ONDANSETRON HCL 4 MG PO TABS: 4.0000 mg | ORAL_TABLET | Freq: Four times a day (QID) | ORAL | Status: DC | PRN

## 2012-07-14 MED ORDER — VANCOMYCIN HCL IN DEXTROSE 1-5 GM/200ML-% IV SOLN
1000.0000 mg | Freq: Once | INTRAVENOUS | Status: AC
  Administered 2012-07-14: 1000 mg via INTRAVENOUS
  Filled 2012-07-14: qty 200

## 2012-07-14 MED ORDER — SODIUM CHLORIDE 0.9 % IJ SOLN
3.0000 mL | Freq: Two times a day (BID) | INTRAMUSCULAR | Status: DC
  Administered 2012-07-14 – 2012-07-15 (×3): 3 mL via INTRAVENOUS

## 2012-07-14 MED ORDER — PIPERACILLIN-TAZOBACTAM 3.375 G IVPB
3.3750 g | Freq: Three times a day (TID) | INTRAVENOUS | Status: DC
  Administered 2012-07-14 – 2012-07-18 (×13): 3.375 g via INTRAVENOUS
  Filled 2012-07-14 (×18): qty 50

## 2012-07-14 MED ORDER — ACETAMINOPHEN 650 MG RE SUPP: 650.0000 mg | Freq: Four times a day (QID) | RECTAL | Status: DC | PRN

## 2012-07-14 MED ORDER — INSULIN GLARGINE 100 UNIT/ML ~~LOC~~ SOLN
18.0000 [IU] | Freq: Every day | SUBCUTANEOUS | Status: DC
  Administered 2012-07-15 – 2012-07-17 (×3): 18 [IU] via SUBCUTANEOUS
  Filled 2012-07-14 (×5): qty 0.18

## 2012-07-14 MED ORDER — PIPERACILLIN-TAZOBACTAM 3.375 G IVPB
3.3750 g | Freq: Once | INTRAVENOUS | Status: AC
  Administered 2012-07-14: 3.375 g via INTRAVENOUS
  Filled 2012-07-14: qty 50

## 2012-07-14 MED ORDER — LORAZEPAM 2 MG/ML IJ SOLN: 1.0000 mg | Freq: Four times a day (QID) | INTRAMUSCULAR | Status: AC | PRN

## 2012-07-14 MED ORDER — SENNA 8.6 MG PO TABS
1.0000 | ORAL_TABLET | Freq: Two times a day (BID) | ORAL | Status: DC
  Administered 2012-07-14 – 2012-07-18 (×9): 8.6 mg via ORAL
  Filled 2012-07-14 (×11): qty 1

## 2012-07-14 MED ORDER — RISPERIDONE 1 MG PO TABS
1.0000 mg | ORAL_TABLET | Freq: Every day | ORAL | Status: DC
  Administered 2012-07-14 – 2012-07-17 (×4): 1 mg via ORAL
  Filled 2012-07-14 (×5): qty 1

## 2012-07-14 NOTE — Discharge Summary (Signed)
Seen and agreed. Allysen Lazo, MD 
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
## 2012-07-14 NOTE — ED Notes (Signed)
POCT CBG  236
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
## 2012-07-14 NOTE — ED Notes (Signed)
ZOX:[id number]<AV> Expected date:<BR> Expected time:<BR> Means of arrival:<BR> Comments:<BR> EMS Cellulitis

## 2012-07-14 NOTE — ED Provider Notes (Signed)
Medical screening examination/treatment/procedure(s) were performed by non-physician practitioner and as supervising physician I was immediately available for consultation/collaboration.  Orla Estrin M Tyeshia Cornforth, MD 07/14/12 0333 
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
## 2012-07-14 NOTE — ED Notes (Signed)
CBG completed on pt. CBG 150.
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
## 2012-07-14 NOTE — Progress Notes (Signed)
Hypoglycemic Event  CBG: 67  Treatment: carb snack  Symptoms: none  Follow-up CBG: Time: 21:41 CBG Result: 123  Possible Reasons for Event: unknown  Comments/MD notified:yes. Hold tonight's dose of Lantus    [name], Mitchell [name]  Remember to initiate Hypoglycemia Order Set & complete

## 2012-07-14 NOTE — Progress Notes (Signed)
Pt transferred to the the unit via ambulance. Pt understands very little [place]. Used telephonic interpreter [phone] to orient pt to room, staff, and call bell. Page with call back (Dr. Ardeth Sportsman) to [facility] medicine services for notification of patient arrival to the floor. Awaiting orders. Dondra Spry
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
## 2012-07-14 NOTE — Progress Notes (Signed)
Per interpreter line, interpreter # [phone], pt denies SI/HI at this time, agrees to notify RN if that changes. Pt denies Visual or auditory hallucinations. Pt states he understands plan of care, will continue to monitor.

## 2012-07-15 LAB — CBC
HCT: 31.6 % — ABNORMAL LOW (ref 39.0–52.0)
Hemoglobin: 11.5 g/dL — ABNORMAL LOW (ref 13.0–17.0)
MCHC: 36.4 g/dL — ABNORMAL HIGH (ref 30.0–36.0)
RDW: 14 % (ref 11.5–15.5)
WBC: 3.4 10*3/uL — ABNORMAL LOW (ref 4.0–10.5)

## 2012-07-15 LAB — BASIC METABOLIC PANEL
BUN: 9 mg/dL (ref 6–23)
CO2: 23 mEq/L (ref 19–32)
Calcium: 7.9 mg/dL — ABNORMAL LOW (ref 8.4–10.5)
Chloride: 101 mEq/L (ref 96–112)
Creatinine, Ser: 0.91 mg/dL (ref 0.50–1.35)
GFR calc non Af Amer: >90 mL/min (ref >90)
Glucose, Bld: 293 mg/dL — ABNORMAL HIGH (ref 70–99)
Potassium: 3.7 mEq/L (ref 3.5–5.1)

## 2012-07-15 LAB — GLUCOSE, CAPILLARY
Glucose-Capillary: 214 mg/dL — ABNORMAL HIGH (ref 70–99)
Glucose-Capillary: 130 mg/dL — ABNORMAL HIGH (ref 70–99)

## 2012-07-15 MED ORDER — VANCOMYCIN HCL IN DEXTROSE 1-5 GM/200ML-% IV SOLN
1000.0000 mg | Freq: Three times a day (TID) | INTRAVENOUS | Status: DC
  Administered 2012-07-15 – 2012-07-17 (×6): 1000 mg via INTRAVENOUS
  Filled 2012-07-15 (×7): qty 200

## 2012-07-15 NOTE — Progress Notes (Signed)
FMTS Attending Daily Note: Mitchell Severs MD [phone] pager office [phone] I have discussed this patient with the resident and reviewed the assessment and plan as documented above. I agree wit the resident's findings and plan.  

## 2012-07-15 NOTE — Progress Notes (Signed)
Family Medicine Teaching Service Daily Progress Note Service Pager: [phone]   Subjective: still has pain in RLE. Has decreased appetite but is drinking well. Denies SI/HI today. Says he is a little short of breath.  Objective: Temp:  [97.5 F (36.4 C)-98.7 F (37.1 C)] 98.4 F (36.9 C) (06/21 0453) Pulse Rate:  [66-78] 71 (06/21 0453) Resp:  [20] 20 (06/21 0453) BP: (105-119)/(56-70) 119/69 mmHg (06/21 0453) SpO2:  [98 %-99 %] 99 % (06/21 0453) Weight:  [191 lb 5.8 oz (86.8 kg)] 191 lb 5.8 oz (86.8 kg) (06/20 2120) Exam: General: NAD Cardiovascular: RRR Respiratory: NWOB, [place] in place, coarse crackles in RLL but otherwise CTAB Abdomen: mildly distended but nontender to palpation Extremities: RLE with improvement in erythema, which has regressed about 1cm from like drawn at admission. LLE without erythema. RLE still tender to palpation over cellulitis.  I have reviewed the patient's medications, labs, imaging, and diagnostic testing.  Notable results are summarized below.  Medications:  Scheduled Meds: . citalopram  40 mg Oral Daily  . enoxaparin (LOVENOX) injection  40 mg Subcutaneous Q24H  . folic acid  1 mg Oral Daily  . glimepiride  4 mg Oral QAC breakfast  . insulin aspart  0-15 Units Subcutaneous TID WC  . insulin aspart  6 Units Subcutaneous TID WC  . insulin glargine  18 Units Subcutaneous QHS  . multivitamin with minerals  1 tablet Oral Daily  . pantoprazole  40 mg Oral Daily  . piperacillin-tazobactam (ZOSYN)  IV  3.375 g Intravenous Q8H  . risperiDONE  1 mg Oral QHS  . senna  1 tablet Oral BID  . sodium chloride  3 mL Intravenous Q12H  . thiamine  100 mg Oral Daily   Or  . thiamine  100 mg Intravenous Daily  . traZODone  50 mg Oral QHS,MR X 1  . vancomycin  1,250 mg Intravenous Q12H   Continuous Infusions: . sodium chloride 10 mL/hr (07/14/12 0844)   PRN Meds:.acetaminophen, acetaminophen, LORazepam, LORazepam, ondansetron (ZOFRAN) IV, ondansetron,  oxyCODONE  Labs:  CBC  Recent Labs Lab 07/14/12 0141 07/15/12 0520  WBC 6.8 3.4*  HGB 11.8* 11.5*  HCT 32.2* 31.6*  PLT 69* 67*     CMET  Recent Labs Lab 07/10/12 1935 07/14/12 0141 07/15/12 0520  NA 135 129* 133*  K 3.7 3.1* 3.7  CL 97 97 101  CO2 26 21 23   BUN 9 10 9   CREATININE 0.67 0.94 0.91  GLUCOSE 302* 221* 293*  CALCIUM 9.9 8.5 7.9*      CXR: Bibasilar airspace disease may represent atelectasis or pneumonia.  Assessment/Plan:  Larone Kliethermes is a 37 y.o. year old male presenting with worsening RLE cellulitis after failing one dose of outpatient bactrim.   # RLE Cellulitis:  - vanc/zosyn for abx coverage  - oxycodone prn pain  - Dr. Lajoyce Corners of ortho recs PICC line with 6 weeks of IV antibiotics with f/u after d/c, no intervention for surgical intervention at this time [ ]  order PICC  # SOB: unclear etiology, per pt is just mild - CXR shows bibasilar airspace disease, not convincing for focal pneumonia per my read - on abx IV for RLE cellulitis which should cover PNA as well - PE less likely as not tachycardic, has not had documented O2 requirement [ ]  will ask RN to remove O2 and document sats, if SOB worsens consider further workup  # Diabetes Mellitus:  - had one hypoglycemic event of 67 overnight so Lantus was  held - continue SSI, hold Lantus for now - continue ome glimepiride, holding metformin  [ ]  f/u CBG's  # Depression:  - continue home celexa, risperdal, trazodone  - monitor for worsening mood or suicidal thoughts-denies currently   # Alcohol Abuse  - last drink he endorses was prior to [facility] admission (last Monday)  - CIWA protocol with prn ativan, all scores <5 so far without any ativan required  # FEN/GI:  - carb modified diet  - SLIV  - continue home PPI   # Prophylaxis:  - SQ lovenox - pt has chronic thrombocytopenia but platelets are stable from admission. Continue to monitor platelets closely. Not a candidate for SCD's due to  RLE cellulitis.  # Dispo:  - pending clinical improvement  - anticipate d/c home on Monday 6/23 once has PICC and [facility] RN gets set up for IV abx as outpatient.  # Code Status: discussed with patient upon admission, he desires to be full code   Levert Feinstein, MD [facility] Medicine PGY-1 Service Pager [phone]

## 2012-07-15 NOTE — Progress Notes (Signed)
ANTIBIOTIC CONSULT NOTE - INITIAL  Pharmacy Consult for vancomycin and zosyn  Indication: cellulitis and chronic osteomyelitis  No Known Allergies  Patient Measurements: Height: 5\' 4"  (162.6 cm) Weight: 191 lb 5.8 oz (86.8 kg) IBW/kg (Calculated) : 59.2   Vital Signs: Temp: 98.1 F (36.7 C) (06/21 0918) Temp src: Oral (06/21 0918) BP: 103/68 mmHg (06/21 0918) Pulse Rate: 74 (06/21 0918) Intake/Output from previous day: 06/20 0701 - 06/21 0700 In: 2045.7 [P.O.:1600; I.V.:95.7; IV Piggyback:350] Out: 4325 [Urine:4325] Intake/Output from this shift: Total I/O In: 120 [P.O.:120] Out: 1200 [Urine:1200]  Labs:  Recent Labs  07/14/12 0141 07/15/12 0520  WBC 6.8 3.4*  HGB 11.8* 11.5*  PLT 69* 67*  CREATININE 0.94 0.91   Estimated Creatinine Clearance: 109.3 ml/min (by C-G formula based on Cr of 0.91).  Assessment: 5 YOM admitted after failing treatment of RLE cellulitis in setting of DM and etoh abuse. Patient also has history of chronic osteomyelitis, and per Ortho he is to continue antibiotics for 6 weeks and will get PICC for this. Previous vancomycin dose was for cellulitis goal trough, but confirmed with Dr. Pollie Meyer to change to osteo dosing.  Goal of Therapy:  Vancomycin trough level 15-20 mcg/ml  Plan:  1. Continue Zosyn 3.375gm IV q8 hr EI 2. Change Vancomycin to 1000mg  IV q8hr starting at 1300 today 3. Follow clinical status, discharge plans, and renal function  Latricia Cerrito D. Maisey Deandrade, PharmD Clinical Pharmacist Pager: [phone] 07/15/2012 11:45 AM

## 2012-07-16 LAB — BASIC METABOLIC PANEL
BUN: 9 mg/dL (ref 6–23)
CO2: 25 mEq/L (ref 19–32)
Calcium: 8.5 mg/dL (ref 8.4–10.5)
GFR calc non Af Amer: >90 mL/min (ref >90)
Glucose, Bld: 176 mg/dL — ABNORMAL HIGH (ref 70–99)

## 2012-07-16 LAB — GLUCOSE, CAPILLARY
Glucose-Capillary: 195 mg/dL — ABNORMAL HIGH (ref 70–99)
Glucose-Capillary: 78 mg/dL (ref 70–99)

## 2012-07-16 LAB — CBC
HCT: 32.1 % — ABNORMAL LOW (ref 39.0–52.0)
Hemoglobin: 11.6 g/dL — ABNORMAL LOW (ref 13.0–17.0)
MCH: 31.4 pg (ref 26.0–34.0)
MCHC: 36.1 g/dL — ABNORMAL HIGH (ref 30.0–36.0)
RBC: 3.7 MIL/uL — ABNORMAL LOW (ref 4.22–5.81)

## 2012-07-16 MED ORDER — POTASSIUM CHLORIDE CRYS ER 20 MEQ PO TBCR
40.0000 meq | EXTENDED_RELEASE_TABLET | Freq: Once | ORAL | Status: AC
  Administered 2012-07-16: 40 meq via ORAL

## 2012-07-16 MED ORDER — SODIUM CHLORIDE 0.9 % IJ SOLN
10.0000 mL | INTRAMUSCULAR | Status: DC | PRN
  Administered 2012-07-18 (×2): 10 mL

## 2012-07-16 MED ORDER — POTASSIUM CHLORIDE CRYS ER 20 MEQ PO TBCR
EXTENDED_RELEASE_TABLET | ORAL | Status: AC
  Filled 2012-07-16: qty 2

## 2012-07-16 MED ORDER — SODIUM CHLORIDE 0.9 % IJ SOLN: 10.0000 mL | Freq: Two times a day (BID) | INTRAMUSCULAR | Status: DC

## 2012-07-16 NOTE — Progress Notes (Signed)
Spoke with Hassell Done with IV Team, OK to use PICC. Steele Berg RN

## 2012-07-16 NOTE — Progress Notes (Signed)
Family Medicine Teaching Service Daily Progress Note Service Pager: [phone]   Subjective: New new complaints.  His right leg pain is stable and tolerable with prn pain medications. His SOB is improved.   Objective: Temp:  [97.6 F (36.4 C)-98.2 F (36.8 C)] 97.6 F (36.4 C) (06/22 0830) Pulse Rate:  [68-78] 78 (06/22 0830) Resp:  [18-21] 18 (06/22 0830) BP: (93-121)/(53-76) 103/53 mmHg (06/22 0830) SpO2:  [91 %-99 %] 96 % (06/22 0830) Weight:  [184 lb 3.2 oz (83.553 kg)] 184 lb 3.2 oz (83.553 kg) (06/21 2036) Exam: General: NAD Cardiovascular: RRR Respiratory: NWOB, CTAB except for occasional coarse breath sound at base Abdomen: mildly distended but nontender to palpation Extremities: RLE with erythema which is improving per patient but still tender to palpation over cellulitis; LLE normal.  I have reviewed the patient's medications, labs, imaging, and diagnostic testing.  Notable results are summarized below.  Medications:  Scheduled Meds: . citalopram  40 mg Oral Daily  . enoxaparin (LOVENOX) injection  40 mg Subcutaneous Q24H  . folic acid  1 mg Oral Daily  . glimepiride  4 mg Oral QAC breakfast  . insulin aspart  0-15 Units Subcutaneous TID WC  . insulin aspart  6 Units Subcutaneous TID WC  . insulin glargine  18 Units Subcutaneous QHS  . multivitamin with minerals  1 tablet Oral Daily  . pantoprazole  40 mg Oral Daily  . piperacillin-tazobactam (ZOSYN)  IV  3.375 g Intravenous Q8H  . risperiDONE  1 mg Oral QHS  . senna  1 tablet Oral BID  . sodium chloride  3 mL Intravenous Q12H  . thiamine  100 mg Oral Daily  . traZODone  50 mg Oral QHS,MR X 1  . vancomycin  1,000 mg Intravenous Q8H   Continuous Infusions: . sodium chloride 10 mL/hr (07/14/12 0844)   PRN Meds:.acetaminophen, acetaminophen, LORazepam, LORazepam, ondansetron (ZOFRAN) IV, ondansetron, oxyCODONE  Labs:  CBC  Recent Labs Lab 07/14/12 0141 07/15/12 0520 07/16/12 0535  WBC 6.8 3.4* 3.9*   HGB 11.8* 11.5* 11.6*  HCT 32.2* 31.6* 32.1*  PLT 69* 67* 90*     CMET  Recent Labs Lab 07/14/12 0141 07/15/12 0520 07/16/12 0535  NA 129* 133* 133*  K 3.1* 3.7 3.3*  CL 97 101 99  CO2 21 23 25   BUN 10 9 9   CREATININE 0.94 0.91 0.81  GLUCOSE 221* 293* 176*  CALCIUM 8.5 7.9* 8.5      CXR: Bibasilar airspace disease may represent atelectasis or pneumonia.  Assessment/Plan:  Mitchell [name] is a 37 y.o. year old male presenting with worsening RLE cellulitis after failing one dose of outpatient bactrim.   # RLE Cellulitis: Stable/improving with antibiotics.  - vanc/zosyn for abx coverage  - oxycodone prn pain. He has required 4 doses past 24 hours.  - Dr. Lajoyce Corners of ortho recs PICC line with 6 weeks of IV antibiotics with f/u after d/c, no intervention for surgical intervention at this time [ ]  PICC ordered 06/21. He is on waiting list to receive. Possibly today.   # SOB endorsed 06/21. Unclear etiology, per pt is just mild. Improved 06/22.  CXR 06/21 shows bibasilar airspace disease - Monitor  # Hypokalemia.  - Replete   # Leuko- and thrombocytopenia. History of chronically low platelets. And low WBC not unusual for patient looking at previous labs up to > 1 year ago. [ ]  Monitor CBC and as outpatient   # Diabetes Mellitus:  - had one hypoglycemic event of 67  overnight 06/20-06/21 so Lantus was held. 266 20:30 on 06/21; others WNL.  - continue SSI - continue home glimepiride, holding metformin, continue home Novolog 6 tid wc and Lantus 18 [ ]  f/u CBG's  # Depression: no SI/HI at this time  - continue home celexa, risperdal, trazodone  - monitor for worsening mood or suicidal thoughts-denies currently   # Alcohol Abuse  - last drink he endorses was prior to [facility] admission (last Monday)  - CIWA protocol with prn ativan, all scores <5 so far without any ativan required  # FEN/GI:  - carb modified diet  - SLIV  - continue home PPI   # Prophylaxis:  - SQ lovenox  - pt has chronic thrombocytopenia but platelets are stable from admission. Continue to monitor platelets closely. Not a candidate for SCD's due to RLE cellulitis.  # Dispo:  - pending clinical improvement  - anticipate d/c home on Monday 6/23 once has PICC and [facility] RN gets set up for IV abx as outpatient  - patient reports difficulties with transportation 06/23 but not on 06/24; if he is discharged, will consult CM regarding voucher for transportation. Request sent to SW regarding this.   # Code Status: discussed with patient upon admission, he desires to be full code   Etta Quill. [place], MD [facility] Medicine PGY-3 Service Pager [phone]

## 2012-07-16 NOTE — Progress Notes (Signed)
FMTS Attending Daily Note: Mitchell Levy MD [phone] pager office [phone] I have discussed this patient with the resident and reviewed the assessment and plan as documented above. I agree wit the resident's findings and plan. Awaiting PICC line placement fr 6  Antibiotics, then plan d/c,

## 2012-07-16 NOTE — Progress Notes (Signed)
Peripherally Inserted Central Catheter/Midline Placement  The IV Nurse has discussed with the patient and/or persons authorized to consent for the patient, the purpose of this procedure and the potential benefits and risks involved with this procedure.  The benefits include less needle sticks, lab draws from the catheter and patient may be discharged home with the catheter.  Risks include, but not limited to, infection, bleeding, blood clot (thrombus formation), and puncture of an artery; nerve damage and irregular heat beat.  Alternatives to this procedure were also discussed.  PICC/Midline Placement Documentation  PICC / Midline Single Lumen 07/16/12 PICC Right Cephalic (Active)    Discuss with use of spanish inteper via phone service   Ethelda Chick 07/16/2012, 4:37 PM

## 2012-07-17 LAB — GLUCOSE, CAPILLARY
Glucose-Capillary: 157 mg/dL — ABNORMAL HIGH (ref 70–99)
Glucose-Capillary: 117 mg/dL — ABNORMAL HIGH (ref 70–99)
Glucose-Capillary: 114 mg/dL — ABNORMAL HIGH (ref 70–99)
Glucose-Capillary: 158 mg/dL — ABNORMAL HIGH (ref 70–99)

## 2012-07-17 LAB — BASIC METABOLIC PANEL
BUN: 7 mg/dL (ref 6–23)
Chloride: 100 mEq/L (ref 96–112)
GFR calc Af Amer: >90 mL/min (ref >90)
GFR calc non Af Amer: >90 mL/min (ref >90)
Potassium: 3.6 mEq/L (ref 3.5–5.1)
Sodium: 136 mEq/L (ref 135–145)

## 2012-07-17 LAB — CBC
HCT: 33.4 % — ABNORMAL LOW (ref 39.0–52.0)
MCHC: 36.2 g/dL — ABNORMAL HIGH (ref 30.0–36.0)
Platelets: 97 10*3/uL — ABNORMAL LOW (ref 150–400)
RDW: 13.7 % (ref 11.5–15.5)
WBC: 3.4 10*3/uL — ABNORMAL LOW (ref 4.0–10.5)

## 2012-07-17 MED ORDER — VANCOMYCIN HCL 10 G IV SOLR
1250.0000 mg | Freq: Three times a day (TID) | INTRAVENOUS | Status: DC
  Filled 2012-07-17 (×2): qty 1250

## 2012-07-17 MED ORDER — VANCOMYCIN HCL IN DEXTROSE 1-5 GM/200ML-% IV SOLN
1000.0000 mg | Freq: Three times a day (TID) | INTRAVENOUS | Status: DC
  Administered 2012-07-17 – 2012-07-18 (×3): 1000 mg via INTRAVENOUS
  Filled 2012-07-17 (×6): qty 200

## 2012-07-17 NOTE — Progress Notes (Signed)
FMTS Attending Daily Note:  Renold Don MD  [phone] pager  Family Practice pager:  [phone] I have seen and examined this patient and have reviewed their chart. I have discussed this patient with the resident. I agree with the resident's findings, assessment and care plan.  Additionally:  No new complaints.  No pain currently.  Sleeping and arousable, comfortable appearing.  Can leave off cardiac monitor for now, RRR.  Abdomen nontender currently.  For renal US tomorrow.   Tobey Grim, MD 07/17/2012 2:26 PM

## 2012-07-17 NOTE — Progress Notes (Signed)
Family Medicine Teaching Service Daily Progress Note Service Pager: [phone]   Subjective: New new complaints. Still endorses pain in right leg. Says SOB is mild. Denies SI/HI. Eating and drinking.  Objective: Temp:  [97.9 F (36.6 C)-98.5 F (36.9 C)] 98 F (36.7 C) (06/23 0454) Pulse Rate:  [66-71] 66 (06/23 0454) Resp:  [18-23] 22 (06/23 0454) BP: (110-123)/(69-79) 110/69 mmHg (06/23 0454) SpO2:  [95 %-96 %] 95 % (06/23 0454) Weight:  [180 lb 8 oz (81.874 kg)] 180 lb 8 oz (81.874 kg) (06/22 2024) Exam: General: NAD Cardiovascular: RRR Respiratory: NWOB, CTAB Abdomen: mildly distended but nontender to palpation Extremities: RLE with erythema which is much improved since admission. Still some tenderness over cellulitis. LLE normal without erythema, warmth or tenderness.   I have reviewed the patient's medications, labs, imaging, and diagnostic testing.  Notable results are summarized below.  Medications:  Scheduled Meds: . citalopram  40 mg Oral Daily  . enoxaparin (LOVENOX) injection  40 mg Subcutaneous Q24H  . folic acid  1 mg Oral Daily  . glimepiride  4 mg Oral QAC breakfast  . insulin aspart  0-15 Units Subcutaneous TID WC  . insulin aspart  6 Units Subcutaneous TID WC  . insulin glargine  18 Units Subcutaneous QHS  . multivitamin with minerals  1 tablet Oral Daily  . pantoprazole  40 mg Oral Daily  . piperacillin-tazobactam (ZOSYN)  IV  3.375 g Intravenous Q8H  . risperiDONE  1 mg Oral QHS  . senna  1 tablet Oral BID  . sodium chloride  10-40 mL Intracatheter Q12H  . sodium chloride  3 mL Intravenous Q12H  . thiamine  100 mg Oral Daily  . traZODone  50 mg Oral QHS,MR X 1  . vancomycin  1,250 mg Intravenous Q8H   Continuous Infusions: . sodium chloride 10 mL/hr (07/14/12 0844)   PRN Meds:.acetaminophen, acetaminophen, ondansetron (ZOFRAN) IV, ondansetron, oxyCODONE, sodium chloride  Labs:  CBC  Recent Labs Lab 07/15/12 0520 07/16/12 0535 07/17/12 0500   WBC 3.4* 3.9* 3.4*  HGB 11.5* 11.6* 12.1*  HCT 31.6* 32.1* 33.4*  PLT 67* 90* 97*     CMET  Recent Labs Lab 07/15/12 0520 07/16/12 0535 07/17/12 0500  NA 133* 133* 136  K 3.7 3.3* 3.6  CL 101 99 100  CO2 23 25 25   BUN 9 9 7   CREATININE 0.91 0.81 0.90  GLUCOSE 293* 176* 189*  CALCIUM 7.9* 8.5 8.8      CXR: Bibasilar airspace disease may represent atelectasis or pneumonia.  Assessment/Plan:  Mitchell [name] is a 37 y.o. year old male presenting with worsening RLE cellulitis after failing one dose of outpatient bactrim.   # RLE Cellulitis: Stable/improving with antibiotics.  - currently on vanc/zosyn for abx coverage, however per conversation with Dr. Lajoyce Corners of ortho this morning, will just plan for vanc at home. - oxycodone prn pain. He has required 3 doses past 24 hours.  - Dr. Lajoyce Corners of ortho recs PICC line with 6 weeks of IV antibiotics with f/u after d/c, no intervention for surgical intervention at this time.  - PICC placed on 6/22 for IV abx - will touch base with ID today regarding recommendations for vanc regimen as outpatient (dosing, lab draws, etc) - ready for d/c once home health set up  # SOB endorsed 06/21. Unclear etiology, per pt is just mild. Improved 06/22.  CXR 06/21 shows bibasilar airspace disease - Monitor  # Hypokalemia - resolved  # Leuko- and thrombocytopenia. History of  chronically low platelets. And low WBC not unusual for patient looking at previous labs up to > 1 year ago. [ ]  Monitor CBC and as outpatient   # Diabetes Mellitus:  - had one hypoglycemic event of 67 overnight 06/20-06/21 so Lantus was held that night. - continue SSI - continue home glimepiride, holding metformin, continue home Novolog 6 tid wc and Lantus 18  # Depression: no SI/HI at this time  - continue home celexa, risperdal, trazodone  - monitor for worsening mood or suicidal thoughts- continues to deny having these  # Alcohol Abuse  - last drink he endorses was  prior to [facility] admission (last Monday)  - CIWA protocol with prn ativan, all scores <5 so far without any ativan required  # FEN/GI:  - carb modified diet  - SLIV  - continue home PPI   # Prophylaxis:  - SQ lovenox - pt has chronic thrombocytopenia but platelets are stable from admission. Continue to monitor platelets closely. Not a candidate for SCD's due to RLE cellulitis.  # Dispo:  - pending clinical improvement  - anticipate d/c home today with [facility] RN for IV abx  # Code Status: discussed with patient upon admission, he desires to be full code   Levert Feinstein, MD [facility] Medicine PGY-1 Service Pager [phone]

## 2012-07-17 NOTE — Care Management Note (Signed)
   CARE MANAGEMENT NOTE 07/17/2012  Patient:  Mitchell [name], Mitchell [name]   Account Number:  [id number]  Date Initiated:  07/17/2012  Documentation initiated by:  Jencarlos Nicolson  Subjective/Objective Assessment:   Noted plan to d/c to home with IV antibiotics.     Action/Plan:   Will need to clarify prescription.  Will discuss with [facility] once prescription clarified.  Will ask for interpeter for [facility] to communicate plan with pt.   Anticipated DC Date:  07/17/2012   Anticipated DC Plan:  HOME W HOME HEALTH SERVICES         Choice offered to / List presented to:             Status of service:   Medicare Important Message given?   (If response is "NO", the following Medicare IM given date fields will be blank) Date Medicare IM given:   Date Additional Medicare IM given:    Discharge Disposition:    Per UR Regulation:    If discussed at Long Length of Stay Meetings, dates discussed:    Comments:  07/17/12 Spoke with resident re prescription and other d/c meds. Vancomycin will be the only antibiotic. [facility] following and will meet with pt and interputer between 2pm and 3pm today to discuss HH and IV antibiotics with this pt. Per resident pt will not d/c on Lovenox other meds for which he will need assistance. Johny Shock RN MPH Case manager [phone]  07/17/12 Page place to resident , await return call to obtain prescription. Will ask [facility] to communicate with pt re HH needs and assistance at home. Johny Shock RN MPH Case Manager [phone]

## 2012-07-17 NOTE — Progress Notes (Signed)
ANTIBIOTIC CONSULT NOTE - FOLLOW UP  Pharmacy Consult for Vancomycin Indication: osteomyelitis  No Known Allergies  Patient Measurements: Height: 5\' 4"  (162.6 cm) Weight: 180 lb 8 oz (81.874 kg) IBW/kg (Calculated) : 59.2  Vital Signs: Temp: 98 F (36.7 C) (06/23 0940) Temp src: Oral (06/23 0940) BP: 118/73 mmHg (06/23 0940) Pulse Rate: 67 (06/23 0940) Intake/Output from previous day: 06/22 0701 - 06/23 0700 In: 730 [P.O.:480; IV Piggyback:250] Out: 7475 [Urine:7475] Intake/Output from this shift:    Labs:  Recent Labs  07/15/12 0520 07/16/12 0535 07/17/12 0500  WBC 3.4* 3.9* 3.4*  HGB 11.5* 11.6* 12.1*  PLT 67* 90* 97*  CREATININE 0.91 0.81 0.90   Estimated Creatinine Clearance: 107.5 ml/min (by C-G formula based on Cr of 0.9).  Recent Labs  07/17/12 0430  VANCOTROUGH 12.5     Microbiology: Recent Results (from the past 720 hour(s))  CULTURE, BLOOD (ROUTINE X 2)     Status: None   Collection Time    07/14/12  1:41 AM      Result Value Range Status   Specimen Description BLOOD LEFT ARM   Final   Special Requests BOTTLES DRAWN AEROBIC AND ANAEROBIC 6CC   Final   Culture  Setup Time 07/14/2012 08:55   Final   Culture     Final   Value:        BLOOD CULTURE RECEIVED NO GROWTH TO DATE CULTURE WILL BE HELD FOR 5 DAYS BEFORE ISSUING A FINAL NEGATIVE REPORT   Report Status PENDING   Incomplete  CULTURE, BLOOD (ROUTINE X 2)     Status: None   Collection Time    07/14/12  1:48 AM      Result Value Range Status   Specimen Description BLOOD LEFT HAND   Final   Special Requests BOTTLES DRAWN AEROBIC AND ANAEROBIC 5CC   Final   Culture  Setup Time 07/14/2012 08:55   Final   Culture     Final   Value:        BLOOD CULTURE RECEIVED NO GROWTH TO DATE CULTURE WILL BE HELD FOR 5 DAYS BEFORE ISSUING A FINAL NEGATIVE REPORT   Report Status PENDING   Incomplete    Anti-infectives   Start     Dose/Rate Route Frequency Ordered Stop   07/17/12 1400  vancomycin  (VANCOCIN) 1,250 mg in sodium chloride 0.9 % 250 mL IVPB     1,250 mg 166.7 mL/hr over 90 Minutes Intravenous Every 8 hours 07/17/12 0736     07/15/12 1300  vancomycin (VANCOCIN) IVPB 1000 mg/200 mL premix  Status:  Discontinued     1,000 mg 200 mL/hr over 60 Minutes Intravenous Every 8 hours 07/15/12 1146 07/17/12 0736   07/14/12 1500  vancomycin (VANCOCIN) 1,250 mg in sodium chloride 0.9 % 250 mL IVPB  Status:  Discontinued     1,250 mg 166.7 mL/hr over 90 Minutes Intravenous Every 12 hours 07/14/12 0731 07/15/12 1146   07/14/12 0745  piperacillin-tazobactam (ZOSYN) IVPB 3.375 g     3.375 g 12.5 mL/hr over 240 Minutes Intravenous 3 times per day 07/14/12 0731     07/14/12 0130  vancomycin (VANCOCIN) IVPB 1000 mg/200 mL premix     1,000 mg 200 mL/hr over 60 Minutes Intravenous  Once 07/14/12 0114 07/14/12 0521   07/14/12 0130  piperacillin-tazobactam (ZOSYN) IVPB 3.375 g     3.375 g 12.5 mL/hr over 240 Minutes Intravenous  Once 07/14/12 0114 07/14/12 0355     Vancomycin history: 12/19/10- Vanc  trough 8.7 on 1gm q8h 12/20/10 - Vanc trough 22 on 1750mg  q8h 12/19/11 - Vanc trough 23.7 on 750mg  q8h  Assessment: 37 year old male with a history of chronic osteomyelitis to continue Vancomycin at discharge to complete 6 weeks of therapy for osteomyelitis.  Based on trough levels he appears to require q8h dosing, but I have concerns that he may continue to accumulate Vancomycin on this regimen at discharge due to his history of supratherapeutic troughs.  His trough this morning was 12.5, which is close to the desired therapeutic range and I expect will be within the therapeutic range within 1-2 days of continued therapy.  Goal of Therapy:  Vancomycin trough level 15-20 mcg/ml  Plan:  Vancomycin 1gm IV q8h  Recommend twice-weekly BMET and once-weekly Vancomycin trough monitoring at discharge.  Recommend checking Vancomycin trough 6/26.  Estella Husk, Pharm.D., BCPS Clinical  Pharmacist Phone: [phone] or [phone] Pager: [phone] 07/17/2012, 1:47 PM

## 2012-07-17 NOTE — Progress Notes (Signed)
ANTIBIOTIC CONSULT NOTE - FOLLOW UP  Pharmacy Consult for vancomycin Indication: cellulitis and chronic osteomyelitis  Labs:  Recent Labs  07/15/12 0520 07/16/12 0535 07/17/12 0500  WBC 3.4* 3.9* 3.4*  HGB 11.5* 11.6* 12.1*  PLT 67* 90* 97*  CREATININE 0.91 0.81 0.90   Estimated Creatinine Clearance: 107.5 ml/min (by C-G formula based on Cr of 0.9).  Recent Labs  07/17/12 0430  VANCOTROUGH 12.5     Microbiology: Recent Results (from the past 720 hour(s))  CULTURE, BLOOD (ROUTINE X 2)     Status: None   Collection Time    07/14/12  1:41 AM      Result Value Range Status   Specimen Description BLOOD LEFT ARM   Final   Special Requests BOTTLES DRAWN AEROBIC AND ANAEROBIC 6CC   Final   Culture  Setup Time 07/14/2012 08:55   Final   Culture     Final   Value:        BLOOD CULTURE RECEIVED NO GROWTH TO DATE CULTURE WILL BE HELD FOR 5 DAYS BEFORE ISSUING A FINAL NEGATIVE REPORT   Report Status PENDING   Incomplete  CULTURE, BLOOD (ROUTINE X 2)     Status: None   Collection Time    07/14/12  1:48 AM      Result Value Range Status   Specimen Description BLOOD LEFT HAND   Final   Special Requests BOTTLES DRAWN AEROBIC AND ANAEROBIC 5CC   Final   Culture  Setup Time 07/14/2012 08:55   Final   Culture     Final   Value:        BLOOD CULTURE RECEIVED NO GROWTH TO DATE CULTURE WILL BE HELD FOR 5 DAYS BEFORE ISSUING A FINAL NEGATIVE REPORT   Report Status PENDING   Incomplete    Anti-infectives   Start     Dose/Rate Route Frequency Ordered Stop   07/17/12 1400  vancomycin (VANCOCIN) 1,250 mg in sodium chloride 0.9 % 250 mL IVPB     1,250 mg 166.7 mL/hr over 90 Minutes Intravenous Every 8 hours 07/17/12 0736     07/15/12 1300  vancomycin (VANCOCIN) IVPB 1000 mg/200 mL premix  Status:  Discontinued     1,000 mg 200 mL/hr over 60 Minutes Intravenous Every 8 hours 07/15/12 1146 07/17/12 0736   07/14/12 1500  vancomycin (VANCOCIN) 1,250 mg in sodium chloride 0.9 % 250 mL IVPB   Status:  Discontinued     1,250 mg 166.7 mL/hr over 90 Minutes Intravenous Every 12 hours 07/14/12 0731 07/15/12 1146   07/14/12 0745  piperacillin-tazobactam (ZOSYN) IVPB 3.375 g     3.375 g 12.5 mL/hr over 240 Minutes Intravenous 3 times per day 07/14/12 0731     07/14/12 0130  vancomycin (VANCOCIN) IVPB 1000 mg/200 mL premix     1,000 mg 200 mL/hr over 60 Minutes Intravenous  Once 07/14/12 0114 07/14/12 0521   07/14/12 0130  piperacillin-tazobactam (ZOSYN) IVPB 3.375 g     3.375 g 12.5 mL/hr over 240 Minutes Intravenous  Once 07/14/12 0114 07/14/12 0355      Assessment: 37yo male subtherapeutic on vancomycin with initial dosing for cellulitis with chronic osteo.  Goal of Therapy:  Vancomycin trough level 15-20 mcg/ml  Plan:  Will change vancomycin to 1250mg  IV Q8H for calculated trough of ~15 and continue to monitor.  Plan for 6wk of ABX.  Vernard Gambles, PharmD, BCPS  07/17/2012,7:37 AM

## 2012-07-18 LAB — GLUCOSE, CAPILLARY
Glucose-Capillary: 175 mg/dL — ABNORMAL HIGH (ref 70–99)
Glucose-Capillary: 151 mg/dL — ABNORMAL HIGH (ref 70–99)

## 2012-07-18 LAB — CBC
Hemoglobin: 12.9 g/dL — ABNORMAL LOW (ref 13.0–17.0)
MCH: 32.2 pg (ref 26.0–34.0)
MCHC: 36.9 g/dL — ABNORMAL HIGH (ref 30.0–36.0)
RDW: 13.7 % (ref 11.5–15.5)

## 2012-07-18 MED ORDER — HEPARIN SOD (PORK) LOCK FLUSH 100 UNIT/ML IV SOLN
250.0000 [IU] | INTRAVENOUS | Status: AC | PRN
  Administered 2012-07-18: 500 [IU]

## 2012-07-18 MED ORDER — VANCOMYCIN HCL IN DEXTROSE 1-5 GM/200ML-% IV SOLN: 1000.0000 mg | Freq: Three times a day (TID) | INTRAVENOUS | Status: AC

## 2012-07-18 MED ORDER — NICOTINE 21 MG/24HR TD PT24
21.0000 mg | MEDICATED_PATCH | Freq: Every day | TRANSDERMAL | Status: DC
  Filled 2012-07-18: qty 1

## 2012-07-18 MED ORDER — PROCHLORPERAZINE EDISYLATE 5 MG/ML IJ SOLN
10.0000 mg | Freq: Four times a day (QID) | INTRAMUSCULAR | Status: DC | PRN
  Filled 2012-07-18: qty 2

## 2012-07-18 NOTE — Progress Notes (Signed)
Pt signed discharge papers. Papers/discharged education reviewed via interpreter phones, interpreter Suzette Battiest [phone]. Pt states understanding. Pt ambulatory off unit with crutches, accompanied by family members. Room checked for belongings.

## 2012-07-18 NOTE — Progress Notes (Signed)
Per PT pt needs a new set of crutches and is released to go home. FMTS paged to notify. Will continue to monitor.

## 2012-07-18 NOTE — Care Management Note (Signed)
   CARE MANAGEMENT NOTE 07/18/2012  Patient:  Mitchell [name], Mitchell [name]   Account Number:  [id number]  Date Initiated:  07/17/2012  Documentation initiated by:  Shanyiah Conde  Subjective/Objective Assessment:   Noted plan to d/c to home with IV antibiotics.     Action/Plan:   Will need to clarify prescription.  Will discuss with [facility] once prescription clarified.  Will ask for interpeter for [facility] to communicate plan with pt.   Anticipated DC Date:  07/18/2012   Anticipated DC Plan:  HOME W HOME HEALTH SERVICES      DC Planning Services  Medication Assistance  Follow-up appt scheduled      Choice offered to / List presented to:     DME arranged  [facility]        HH arranged  HH-1 RN  HH-2 PT      HH agency  [facility]   Status of service:  Completed, signed off Medicare Important Message given?   (If response is "NO", the following Medicare IM given date fields will be blank) Date Medicare IM given:   Date Additional Medicare IM given:    Discharge Disposition:    Per UR Regulation:    If discussed at Long Length of Stay Meetings, dates discussed:    Comments:  07/18/2012 Pt for d/c, [facility] is unable to provide HHPT, however HHRN will provide home safety eval in the home. Pt d/c to home and [facility] HHRN and interpeter to meet pt and GF in the home. Johny Shock RN MPH, case manager, [phone]   07/17/2012 Called for Spanish interpeter how arrived at 2pm. This CM met with pt, interpeter and [facility] rep, Debbie to plan for home IV antibiotics. [facility] rep was able to qualify pt for indigent services and spoke with pt GF via interpeter to varify that she would be able to assist with IV antibiotic administration. GF if non [place] speaking as well, pt has two children who speak [place], ages 65 and 34. [facility] will provide HHRN, IV antibiotic and supplies. They will also provide interpeter for teaching of pt and GF in the home, upon d/c.                CRoyal RN MPH, CM  07/17/12 Spoke with  resident re prescription and other d/c meds. Vancomycin will be the only antibiotic. [facility] following and will meet with pt and interputer between 2pm and 3pm today to discuss HH and IV antibiotics with this pt. Per resident pt will not d/c on Lovenox other meds for which he will need assistance. Johny Shock RN MPH Case manager [phone]  07/17/12 Page place to resident , await return call to obtain prescription. Will ask [facility] to communicate with pt re HH needs and assistance at home. Johny Shock RN MPH Case Manager [phone]

## 2012-07-18 NOTE — Progress Notes (Signed)
CMT notified of pt d/c orders. Will continue to monitor.

## 2012-07-18 NOTE — Progress Notes (Signed)
Family Medicine Teaching Service Daily Progress Note Service Pager: [phone]   Subjective: New new complaints. States has minimal pain. Denies SI/HI. Denies shortness of breath.  Objective: Temp:  [97.6 F (36.4 C)-98 F (36.7 C)] 97.8 F (36.6 C) (06/24 0514) Pulse Rate:  [64-68] 66 (06/24 0514) Resp:  [18-20] 20 (06/24 0514) BP: (105-123)/(62-82) 105/62 mmHg (06/24 0514) SpO2:  [92 %-97 %] 93 % (06/24 0514) Weight:  [170 lb 12.8 oz (77.474 kg)] 170 lb 12.8 oz (77.474 kg) (06/23 2036) Exam: General: NAD Cardiovascular: RRR Respiratory: NWOB, CTAB Abdomen: mildly distended but nontender to palpation Extremities: RLE without any more erythema, much improved since admission. Still some tenderness over area of previous cellulitis. LLE normal without erythema, warmth or tenderness.  Neuro: able to ambulate independently but hobbles on right leg slightly, grossly nonfocal speech intact  I have reviewed the patient's medications, labs, imaging, and diagnostic testing.  Notable results are summarized below.  Medications:  Scheduled Meds: . citalopram  40 mg Oral Daily  . enoxaparin (LOVENOX) injection  40 mg Subcutaneous Q24H  . folic acid  1 mg Oral Daily  . glimepiride  4 mg Oral QAC breakfast  . insulin aspart  0-15 Units Subcutaneous TID WC  . insulin aspart  6 Units Subcutaneous TID WC  . insulin glargine  18 Units Subcutaneous QHS  . multivitamin with minerals  1 tablet Oral Daily  . pantoprazole  40 mg Oral Daily  . piperacillin-tazobactam (ZOSYN)  IV  3.375 g Intravenous Q8H  . risperiDONE  1 mg Oral QHS  . senna  1 tablet Oral BID  . sodium chloride  10-40 mL Intracatheter Q12H  . sodium chloride  3 mL Intravenous Q12H  . thiamine  100 mg Oral Daily  . traZODone  50 mg Oral QHS,MR X 1  . vancomycin  1,000 mg Intravenous Q8H   Continuous Infusions: . sodium chloride 10 mL/hr (07/14/12 0844)   PRN Meds:.acetaminophen, acetaminophen, ondansetron (ZOFRAN) IV,  ondansetron, oxyCODONE, sodium chloride  Labs:  CBC  Recent Labs Lab 07/16/12 0535 07/17/12 0500 07/18/12 0555  WBC 3.9* 3.4* 5.1  HGB 11.6* 12.1* 12.9*  HCT 32.1* 33.4* 35.0*  PLT 90* 97* 107*     CMET  Recent Labs Lab 07/15/12 0520 07/16/12 0535 07/17/12 0500  NA 133* 133* 136  K 3.7 3.3* 3.6  CL 101 99 100  CO2 23 25 25   BUN 9 9 7   CREATININE 0.91 0.81 0.90  GLUCOSE 293* 176* 189*  CALCIUM 7.9* 8.5 8.8      CXR: Bibasilar airspace disease may represent atelectasis or pneumonia.  Assessment/Plan:  Mitchell [name] is a 37 y.o. year old male presenting with worsening RLE cellulitis after failing one dose of outpatient bactrim.   # RLE Cellulitis: Stable/improving with antibiotics.  - oxycodone prn pain - hasn't required any in last 24 hours - Dr. Lajoyce Corners of ortho recs PICC line with 6 weeks of IV antibiotics with f/u after d/c, no intervention for surgical intervention at this time.  - currently on vanc/zosyn for abx coverage, however per conversation with Dr. Lajoyce Corners of ortho on 6/23, will just plan for vanc at home. - PICC placed on 6/22 for IV abx - [facility] for IV vanc at home today - ready for d/c today  - will set up [facility] PT eval by [facility]. Per case manager, pt has crutches and walker at home. [facility] will be able to accomplish this PT eval quickly.  #  SOB endorsed 06/21. Unclear etiology, per pt is just mild. Improved 06/22.  CXR 06/21 shows bibasilar airspace disease - resolved  # Hypokalemia - resolved  # Leuko- and thrombocytopenia. History of chronically low platelets. And low WBC not unusual for patient looking at previous labs up to > 1 year ago. [ ]  Monitor CBC as outpatient   # Diabetes Mellitus:  - had one hypoglycemic event of 67 overnight 06/20-06/21 so Lantus was held that night. - continue SSI - continue home glimepiride, holding metformin, continue home Novolog 6 tid wc and Lantus 18  # Depression: no SI/HI at this  time  - continue home celexa, risperdal, trazodone  - monitor for worsening mood or suicidal thoughts- continues to deny having these  # Alcohol Abuse  - last drink he endorses was prior to [facility] admission (last Monday)  - CIWA protocol with prn ativan, all scores <5 so far without any ativan required  # FEN/GI:  - carb modified diet  - SLIV  - continue home PPI   # Prophylaxis:  - SQ lovenox - pt has chronic thrombocytopenia but platelets are stable from admission. Continue to monitor platelets closely. Not a candidate for SCD's due to RLE cellulitis.  # Dispo:  - pending clinical improvement - d/c home today with HH RN for IV abx & HH PT eval  # Code Status: discussed with patient upon admission, he desires to be full code   Levert Feinstein, MD [facility] Medicine PGY-1 Service Pager [phone]
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
## 2012-07-18 NOTE — Progress Notes (Signed)
Per interpreter line, pt denies SI/HI. Pt denies visual or auditory hallucinations. Pt states pain in foot "tolerable." Will continue to monitor.

## 2012-07-18 NOTE — Discharge Summary (Signed)
[facility] Discharge Summary  Patient name: Mitchell [name] Medical record number: [id number] Date of birth: 08/30/1975 Age: 37 y.o. Gender: male Date of Admission: 07/14/2012  Date of Discharge: 07/18/2012 Admitting Physician: Tobey Grim, MD  Primary Care Provider: [name], MD  Indication for Hospitalization: RLE cellulitis  Discharge Diagnoses:  1. RLE cellulitis, concern for chronic osteomyelitis 2. Shortness of breath 3. Hypokalemia 4. Leukopenia 5. Thrombocytopenia 6. DM 7. Depression 8. Alcohol abuse  Brief Hospital Course:  Mitchell [name] is a 37 y.o. male who was admitted to the hospital after presenting complaining of RLE redness and swelling, which had not improved after one dose of outpatient bactrim.  # cellulitis: pt was started on vancomycin and zosyn, with improvement in his leg. Was given oxycodone prn for pain. Plain films did not show evidence of osteomyelitis. Orthopedics (Dr. Lajoyce Corners) was consulted and recommended 6 weeks of IV antibiotics (vancomycin) which was deemed necessary for limb salvage. Pt had a PICC placed on 6/22 and home health was set up for administration of IV vancomycin.   # SOB: CXR showed bibasilar airspace disease. Did not have O2 requirement prior to discharge. Likely was secondary to atelectasis from inactivity. Resolved prior to discharge.  # Hypokalemia: repleted prn  # Leukopenia/Thrombocytopenia: hx of chronic leukopenia & thrombocytopenia from record review. Likely secondary to alcohol abuse. Recommend CBC be monitored as an outpatient.  # Diabetes: continued SSI and home Lantus 18 units plus 6 units novolog TID wc. Also continued glimepiride but held metformin while hopsitalized. Pt did have one hypoglycemic event of 67 overnight from 6/20-6/21 so Lantus was held that night only.  # Depression: pt had recent behavioral health hospital admission for suicidal gesture. Pt denied SI/HI throughout his  hospitalization. We continued his celexa, risperdal, and trazodone.   # Alcohol abuse: was monitored on CIWA protocol, did not require any ativan.  Consultations: orthopedics (Dr. Lajoyce Corners)  Procedures: PICC line placement 6/22  Significant Labs and Imaging:   CBC  Lab  07/16/12 0535  07/17/12 0500  07/18/12 0555   WBC  3.9*  3.4*  5.1   HGB  11.6*  12.1*  12.9*   HCT  32.1*  33.4*  35.0*   PLT  90*  97*  107*     CMET  Lab  07/15/12 0520  07/16/12 0535  07/17/12 0500   NA  133*  133*  136   K  3.7  3.3*  3.6   CL  101  99  100   CO2  23  25  25    BUN  9  9  7    CREATININE  0.91  0.81  0.90   GLUCOSE  293*  176*  189*   CALCIUM  7.9*  8.5  8.8    CXR:  Bibasilar airspace disease may represent atelectasis or pneumonia.  DG Ankle R complete: Three views of the right ankle demonstrates irregularity of the lateral aspect of the distal tibia, which is favored be related to remote trauma, and appears similar to prior foot radiographs. No definite osteolysis identified. Overlying soft tissues are irregular; decreased in thickness anteriorly, and otherwise increased in thickness. No acute displaced fracture. Fusion of much of the midfoot and hindfoot.  DG R Foot complete: Status post first ray amputation, new compared to the prior study. Diffuse osteopenia. Fusion of much of the mid foot and hind foot. No definite osteolysis identified. No definite acute displaced fracture, subluxation or dislocation.   Discharge  Medications:    Medication List    STOP taking these medications       sulfamethoxazole-trimethoprim 800-160 MG per tablet  Commonly known as:  BACTRIM DS,SEPTRA DS      TAKE these medications       citalopram 40 MG tablet  Commonly known as:  CELEXA  Take 1 tablet (40 mg total) by mouth daily. For depression     glimepiride 4 MG tablet  Commonly known as:  AMARYL  Take 1 tablet (4 mg total) by mouth daily before breakfast. For control of high blood sugar  (Diabetes)     insulin aspart 100 UNIT/ML injection  Commonly known as:  novoLOG  Inject 6 Units into the skin 3 (three) times daily with meals. For high blood sugar control     insulin glargine 100 UNIT/ML injection  Commonly known as:  LANTUS  Inject 0.18 mLs (18 Units total) into the skin at bedtime. For high blood sugar control     metFORMIN 1000 MG tablet  Commonly known as:  GLUCOPHAGE  Take 1 tablet (1,000 mg total) by mouth 2 (two) times daily with a meal. For control of high blood sugar (Diabetes)     multivitamin with minerals Tabs  Take 1 tablet by mouth daily. For for low vitamin     omeprazole 20 MG capsule  Commonly known as:  PRILOSEC  Take 1 capsule (20 mg total) by mouth daily. For acid reflux     risperiDONE 1 MG tablet  Commonly known as:  RISPERDAL  Take 1 tablet (1 mg total) by mouth at bedtime. For mood control     traZODone 50 MG tablet  Commonly known as:  DESYREL  Take 1 tablet (50 mg total) by mouth at bedtime and may repeat dose one time if needed. For sleep     vancomycin 1 GM/200ML Soln  Commonly known as:  VANCOCIN  Inject 200 mLs (1,000 mg total) into the vein every 8 (eight) hours. Per Home Health ([facility])        Issues for Follow Up:  - will have HH set up for administration of IV vancomycin for 6 weeks - need HH PT with crutches for ambulation - recommend f/u CBC as outpatient due to leukpenia & thrombocytopenia  Outstanding Results: none      Follow-up Information   Follow up with Nadara Mustard, MD On 07/31/2012. (Appointment at 1:30pm July 31, 2012)    Contact information:   [address] [place] [address] [phone]       Follow up with Felix Pacini, DO On 07/26/2012. (at 2:30pm to meet your new primary doctor)    Contact information:   [address] [place] [address] [phone]       Discharge Condition: Stable  Discharge Disposition: Home  Discharge Instructions: Please refer to  Patient Instructions section of EMR for full details.  Patient was counseled important signs and symptoms that should prompt return to medical care, changes in medications, dietary instructions, activity restrictions, and follow up appointments.    Levert Feinstein, MD Family Medicine PGY-1

## 2012-07-18 NOTE — Progress Notes (Signed)
FMTS Attending Daily Note:  Mitchell Don MD  [phone] pager  Family Practice pager:  [phone] I have discussed this patient with the resident Pollie Meyer.  I agree with their findings, assessment, and care plan

## 2012-07-18 NOTE — Progress Notes (Signed)
Orthopedic Tech Progress Note Patient Details:  Mitchell [name] [birth year] [id number]  Ortho Devices Type of Ortho Device: Crutches Ortho Device/Splint Interventions: Adjustment   Cammer, Mickie Bail 07/18/2012, 2:17 PM

## 2012-07-20 ENCOUNTER — Ambulatory Visit (INDEPENDENT_AMBULATORY_CARE_PROVIDER_SITE_OTHER): Payer: No Typology Code available for payment source | Admitting: Family Medicine

## 2012-07-20 ENCOUNTER — Ambulatory Visit (HOSPITAL_COMMUNITY)
Admission: RE | Admit: 2012-07-20 | Discharge: 2012-07-20 | Disposition: A | Discharge status: Home / Self Care | Payer: No Typology Code available for payment source | Source: Ambulatory Visit | Attending: Family Medicine | Admitting: Family Medicine

## 2012-07-20 ENCOUNTER — Telehealth: Payer: Self-pay | Admitting: *Deleted

## 2012-07-20 ENCOUNTER — Encounter (HOSPITAL_COMMUNITY): Payer: Self-pay | Admitting: General Practice

## 2012-07-20 ENCOUNTER — Encounter: Payer: Self-pay | Admitting: Family Medicine

## 2012-07-20 ENCOUNTER — Ambulatory Visit: Payer: Self-pay | Admitting: Dietician

## 2012-07-20 ENCOUNTER — Observation Stay (HOSPITAL_COMMUNITY)
Admission: AD | Admit: 2012-07-20 | Discharge: 2012-07-22 | Disposition: A | Discharge status: Home Health | Payer: No Typology Code available for payment source | Source: Ambulatory Visit | Attending: Family Medicine | Admitting: Family Medicine

## 2012-07-20 VITALS — BP 132/83 | HR 84 | Temp 97.9°F | Wt 176.0 lb

## 2012-07-20 DIAGNOSIS — M7989 Other specified soft tissue disorders: Secondary | ICD-10-CM

## 2012-07-20 DIAGNOSIS — F329 Major depressive disorder, single episode, unspecified: Secondary | ICD-10-CM

## 2012-07-20 DIAGNOSIS — L02611 Cutaneous abscess of right foot: Secondary | ICD-10-CM | POA: Diagnosis present

## 2012-07-20 DIAGNOSIS — L02419 Cutaneous abscess of limb, unspecified: Principal | ICD-10-CM | POA: Insufficient documentation

## 2012-07-20 DIAGNOSIS — D696 Thrombocytopenia, unspecified: Secondary | ICD-10-CM | POA: Insufficient documentation

## 2012-07-20 DIAGNOSIS — S98139A Complete traumatic amputation of one unspecified lesser toe, initial encounter: Secondary | ICD-10-CM | POA: Insufficient documentation

## 2012-07-20 DIAGNOSIS — Z23 Encounter for immunization: Secondary | ICD-10-CM | POA: Insufficient documentation

## 2012-07-20 DIAGNOSIS — F3289 Other specified depressive episodes: Secondary | ICD-10-CM | POA: Insufficient documentation

## 2012-07-20 DIAGNOSIS — E118 Type 2 diabetes mellitus with unspecified complications: Secondary | ICD-10-CM | POA: Diagnosis present

## 2012-07-20 DIAGNOSIS — F1020 Alcohol dependence, uncomplicated: Secondary | ICD-10-CM | POA: Diagnosis present

## 2012-07-20 DIAGNOSIS — K7010 Alcoholic hepatitis without ascites: Secondary | ICD-10-CM | POA: Insufficient documentation

## 2012-07-20 DIAGNOSIS — L03031 Cellulitis of right toe: Secondary | ICD-10-CM | POA: Diagnosis present

## 2012-07-20 DIAGNOSIS — F1010 Alcohol abuse, uncomplicated: Secondary | ICD-10-CM | POA: Insufficient documentation

## 2012-07-20 DIAGNOSIS — L03119 Cellulitis of unspecified part of limb: Secondary | ICD-10-CM | POA: Insufficient documentation

## 2012-07-20 DIAGNOSIS — K2920 Alcoholic gastritis without bleeding: Secondary | ICD-10-CM | POA: Insufficient documentation

## 2012-07-20 DIAGNOSIS — E119 Type 2 diabetes mellitus without complications: Secondary | ICD-10-CM | POA: Insufficient documentation

## 2012-07-20 LAB — CREATININE, SERUM: Creatinine, Ser: 0.87 mg/dL (ref 0.50–1.35)

## 2012-07-20 LAB — COMPREHENSIVE METABOLIC PANEL
ALT: 22 U/L (ref 0–53)
Albumin: 3.5 g/dL (ref 3.5–5.2)
CO2: 23 mEq/L (ref 19–32)
Calcium: 8.9 mg/dL (ref 8.4–10.5)
Chloride: 104 mEq/L (ref 96–112)
Sodium: 139 mEq/L (ref 135–145)
Total Protein: 7.9 g/dL (ref 6.0–8.3)

## 2012-07-20 LAB — CBC
Hemoglobin: 13.4 g/dL (ref 13.0–17.0)
MCH: 31.5 pg (ref 26.0–34.0)
MCHC: 36.2 g/dL — ABNORMAL HIGH (ref 30.0–36.0)
Platelets: 115 10*3/uL — ABNORMAL LOW (ref 150–400)
Platelets: 108 10*3/uL — ABNORMAL LOW (ref 150–400)
RBC: 4.27 MIL/uL (ref 4.22–5.81)
WBC: 6.7 10*3/uL (ref 4.0–10.5)

## 2012-07-20 LAB — GLUCOSE, CAPILLARY: Glucose-Capillary: 172 mg/dL — ABNORMAL HIGH (ref 70–99)

## 2012-07-20 LAB — CULTURE, BLOOD (ROUTINE X 2): Culture: NO GROWTH

## 2012-07-20 LAB — PROTIME-INR: Prothrombin Time: 15.4 seconds — ABNORMAL HIGH (ref 11.6–15.2)

## 2012-07-20 MED ORDER — SODIUM CHLORIDE 0.9 % IJ SOLN
10.0000 mL | INTRAMUSCULAR | Status: DC | PRN
  Administered 2012-07-20 – 2012-07-22 (×2): 10 mL
  Administered 2012-07-22: 20 mL

## 2012-07-20 MED ORDER — CITALOPRAM HYDROBROMIDE 40 MG PO TABS
40.0000 mg | ORAL_TABLET | Freq: Every day | ORAL | Status: DC
  Administered 2012-07-20 – 2012-07-22 (×3): 40 mg via ORAL
  Filled 2012-07-20 (×3): qty 1

## 2012-07-20 MED ORDER — FOLIC ACID 1 MG PO TABS
1.0000 mg | ORAL_TABLET | Freq: Every day | ORAL | Status: DC
  Administered 2012-07-20 – 2012-07-22 (×3): 1 mg via ORAL
  Filled 2012-07-20 (×3): qty 1

## 2012-07-20 MED ORDER — LORAZEPAM 2 MG/ML IJ SOLN: 1.0000 mg | Freq: Four times a day (QID) | INTRAMUSCULAR | Status: DC | PRN

## 2012-07-20 MED ORDER — INSULIN ASPART 100 UNIT/ML ~~LOC~~ SOLN
0.0000 [IU] | Freq: Three times a day (TID) | SUBCUTANEOUS | Status: DC
  Administered 2012-07-20 – 2012-07-21 (×3): 2 [IU] via SUBCUTANEOUS
  Administered 2012-07-21: 3 [IU] via SUBCUTANEOUS
  Administered 2012-07-22 (×2): 2 [IU] via SUBCUTANEOUS

## 2012-07-20 MED ORDER — PNEUMOCOCCAL VAC POLYVALENT 25 MCG/0.5ML IJ INJ
0.5000 mL | INJECTION | INTRAMUSCULAR | Status: AC
  Filled 2012-07-20: qty 0.5

## 2012-07-20 MED ORDER — HEPARIN SODIUM (PORCINE) 5000 UNIT/ML IJ SOLN
5000.0000 [IU] | Freq: Three times a day (TID) | INTRAMUSCULAR | Status: DC
  Administered 2012-07-20 – 2012-07-22 (×6): 5000 [IU] via SUBCUTANEOUS
  Filled 2012-07-20 (×8): qty 1

## 2012-07-20 MED ORDER — VITAMIN B-1 100 MG PO TABS
100.0000 mg | ORAL_TABLET | Freq: Every day | ORAL | Status: DC
  Administered 2012-07-20 – 2012-07-22 (×3): 100 mg via ORAL
  Filled 2012-07-20 (×3): qty 1

## 2012-07-20 MED ORDER — INSULIN GLARGINE 100 UNIT/ML ~~LOC~~ SOLN
10.0000 [IU] | Freq: Every day | SUBCUTANEOUS | Status: DC
  Administered 2012-07-20 – 2012-07-21 (×2): 10 [IU] via SUBCUTANEOUS
  Filled 2012-07-20 (×3): qty 0.1

## 2012-07-20 MED ORDER — PIPERACILLIN-TAZOBACTAM 3.375 G IVPB 30 MIN
3.3750 g | INTRAVENOUS | Status: AC
  Administered 2012-07-20: 3.375 g via INTRAVENOUS
  Filled 2012-07-20: qty 50

## 2012-07-20 MED ORDER — PANTOPRAZOLE SODIUM 40 MG PO TBEC
40.0000 mg | DELAYED_RELEASE_TABLET | Freq: Every day | ORAL | Status: DC
  Administered 2012-07-20 – 2012-07-22 (×3): 40 mg via ORAL
  Filled 2012-07-20 (×3): qty 1

## 2012-07-20 MED ORDER — HYDROCODONE-ACETAMINOPHEN 5-325 MG PO TABS
1.0000 | ORAL_TABLET | ORAL | Status: DC | PRN
  Administered 2012-07-20 – 2012-07-22 (×7): 2 via ORAL
  Filled 2012-07-20 (×7): qty 2

## 2012-07-20 MED ORDER — THIAMINE HCL 100 MG/ML IJ SOLN
100.0000 mg | Freq: Every day | INTRAMUSCULAR | Status: DC
  Filled 2012-07-20 (×2): qty 1

## 2012-07-20 MED ORDER — SODIUM CHLORIDE 0.9 % IJ SOLN: 10.0000 mL | Freq: Two times a day (BID) | INTRAMUSCULAR | Status: DC

## 2012-07-20 MED ORDER — RISPERIDONE 1 MG PO TABS
1.0000 mg | ORAL_TABLET | Freq: Every day | ORAL | Status: DC
  Administered 2012-07-20 – 2012-07-21 (×2): 1 mg via ORAL
  Filled 2012-07-20 (×3): qty 1

## 2012-07-20 MED ORDER — INSULIN ASPART 100 UNIT/ML ~~LOC~~ SOLN: 0.0000 [IU] | Freq: Every day | SUBCUTANEOUS | Status: DC

## 2012-07-20 MED ORDER — PIPERACILLIN-TAZOBACTAM 3.375 G IVPB
3.3750 g | Freq: Three times a day (TID) | INTRAVENOUS | Status: DC
  Administered 2012-07-20 – 2012-07-21 (×2): 3.375 g via INTRAVENOUS
  Filled 2012-07-20 (×4): qty 50

## 2012-07-20 MED ORDER — TRAZODONE HCL 50 MG PO TABS
50.0000 mg | ORAL_TABLET | Freq: Every evening | ORAL | Status: DC | PRN
  Administered 2012-07-20 – 2012-07-21 (×2): 50 mg via ORAL
  Filled 2012-07-20 (×5): qty 1

## 2012-07-20 MED ORDER — ADULT MULTIVITAMIN W/MINERALS CH
1.0000 | ORAL_TABLET | Freq: Every day | ORAL | Status: DC
  Administered 2012-07-20 – 2012-07-22 (×3): 1 via ORAL
  Filled 2012-07-20 (×3): qty 1

## 2012-07-20 MED ORDER — LORAZEPAM 1 MG PO TABS: 1.0000 mg | ORAL_TABLET | Freq: Four times a day (QID) | ORAL | Status: DC | PRN

## 2012-07-20 MED ORDER — VANCOMYCIN HCL IN DEXTROSE 1-5 GM/200ML-% IV SOLN
1000.0000 mg | Freq: Three times a day (TID) | INTRAVENOUS | Status: DC
  Administered 2012-07-20 – 2012-07-22 (×7): 1000 mg via INTRAVENOUS
  Filled 2012-07-20 (×9): qty 200

## 2012-07-20 MED ORDER — MORPHINE SULFATE 2 MG/ML IJ SOLN: 1.0000 mg | INTRAMUSCULAR | Status: DC | PRN

## 2012-07-20 NOTE — H&P (Signed)
FMTS Attending Admission Note: Mitchell [name] I  have seen and examined this patient with Dr Armen Pickup at the clinic today, reviewed patient's chart. I have discussed this patient with the resident. I agree with the resident's findings, assessment and care plan.

## 2012-07-20 NOTE — Telephone Encounter (Signed)
PT  - 15.4 ( high) INR - 1.25  Results from [facility] labs - confirmed with Kendal Hymen - pt has been admitted to hospital - no further action needed. Wyatt Haste, RN-BSN
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
## 2012-07-20 NOTE — Progress Notes (Signed)
Right lower extremity venous duplex completed.  Right:  No evidence of DVT, superficial thrombosis, or Baker's cyst.  Left:  Negative for DVT in the common femoral vein.  

## 2012-07-20 NOTE — Progress Notes (Signed)
ADMITTED TO [id number]. ORIENTED TO ROOM AND SURROUNDINGS,INTREPRETER CALLED, PT ADMITTED WITH RUA SL PICC. DENIES NAUSEA/PAIN
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
## 2012-07-20 NOTE — Patient Instructions (Addendum)
Mitchell [name],   Thank you for coming in today. Please go for your doppler to rule out DVT.   If doppler is negative, you will be admitted for observation. If doppler is positive you will be started on xarelto.   Dr. Armen Pickup

## 2012-07-20 NOTE — Progress Notes (Signed)
FAMILY MED NOTIFIED OF ARRIVAL [phone]

## 2012-07-20 NOTE — Progress Notes (Deleted)
Subjective:     Patient ID: Mitchell [name], male   DOB: [birth year], 37 y.o.   MRN: [id number]  HPI   Review of Systems     Objective:   Physical Exam     Assessment:     ***    Plan:     ***

## 2012-07-20 NOTE — Assessment & Plan Note (Addendum)
Negative LE doppler. Symptoms concerning for recurrent cellulitis despite vanc.  Readmitting to hospital.

## 2012-07-21 DIAGNOSIS — L03119 Cellulitis of unspecified part of limb: Secondary | ICD-10-CM

## 2012-07-21 LAB — GLUCOSE, CAPILLARY
Glucose-Capillary: 186 mg/dL — ABNORMAL HIGH (ref 70–99)
Glucose-Capillary: 83 mg/dL (ref 70–99)

## 2012-07-21 MED ORDER — CIPROFLOXACIN HCL 500 MG PO TABS
500.0000 mg | ORAL_TABLET | Freq: Two times a day (BID) | ORAL | Status: DC
  Administered 2012-07-21 – 2012-07-22 (×3): 500 mg via ORAL
  Filled 2012-07-21 (×5): qty 1

## 2012-07-21 NOTE — Progress Notes (Signed)
Family Medicine Teaching Service Daily Progress Note Service Pager: [phone]  Subjective: Pt reports improvement in his leg today. Does not think it is red anymore. Denies SI/HI today. Able to eat and drink.  Objective: Temp:  [97.6 F (36.4 C)-97.9 F (36.6 C)] 97.7 F (36.5 C) (06/27 0514) Pulse Rate:  [61-84] 61 (06/27 0514) Resp:  [18] 18 (06/27 0514) BP: (100-132)/(63-83) 100/63 mmHg (06/27 0514) SpO2:  [98 %-99 %] 98 % (06/27 0514) Weight:  [173 lb 8 oz (78.7 kg)-176 lb (79.833 kg)] 173 lb 8 oz (78.7 kg) (06/26 1509) Exam: General: NAD Cardiovascular: RRR Respiratory: CTAB via anterior auscultation, NWOB Abdomen: soft, nontender to palpation, +BS Extremities: RLE appears scarred as previously noted from last admission. No appreciable erythema or edema in ankle or calf. Mildly tender to palpation. Appears similar to discharge day exam from last hospitalization. Left leg is normal without erythema or tenderness.  I have reviewed the patient's medications, labs, imaging, and diagnostic testing.  Notable results are summarized below.  Medications:  Scheduled Meds: . citalopram  40 mg Oral Daily  . folic acid  1 mg Oral Daily  . heparin  5,000 Units Subcutaneous Q8H  . insulin aspart  0-15 Units Subcutaneous TID WC  . insulin aspart  0-5 Units Subcutaneous QHS  . insulin glargine  10 Units Subcutaneous QHS  . multivitamin with minerals  1 tablet Oral Daily  . pantoprazole  40 mg Oral Daily  . piperacillin-tazobactam (ZOSYN)  IV  3.375 g Intravenous Q8H  . pneumococcal 23 valent vaccine  0.5 mL Intramuscular Tomorrow-1000  . risperiDONE  1 mg Oral QHS  . sodium chloride  10-40 mL Intracatheter Q12H  . thiamine  100 mg Oral Daily   Or  . thiamine  100 mg Intravenous Daily  . traZODone  50 mg Oral QHS,MR X 1  . vancomycin  1,000 mg Intravenous Q8H   Continuous Infusions:  PRN Meds:.HYDROcodone-acetaminophen, LORazepam, LORazepam, morphine injection, sodium  chloride  Labs:  CBC  Recent Labs Lab 07/18/12 0555 07/20/12 1036 07/20/12 1400  WBC 5.1 6.7 6.1  HGB 12.9* 13.4 12.3*  HCT 35.0* 37.2* 34.0*  PLT 107* 115* 108*     BMET  Recent Labs Lab 07/16/12 0535 07/17/12 0500 07/20/12 1036 07/20/12 1400  NA 133* 136 139  --   K 3.3* 3.6 3.5  --   CL 99 100 104  --   CO2 25 25 23   --   BUN 9 7 7   --   CREATININE 0.81 0.90 0.82 0.87  GLUCOSE 176* 189* 124*  --   CALCIUM 8.5 8.8 8.9  --        Assessment/Plan:   Mitchell [name] is a 37 y.o. year old male presenting with worsening RLE cellulitis after failing outpatient vancomycin through PICC.   # RLE Cellulitis: doppler negative for DVT yesterday  - tramadol & IV morphine prn pain - currently on vancomycin & zosyn through PICC - d/c zosyn and start PO cipro for gram negative coverage as pt is diabetic [ ]  touch base with case management as will need IV vanc still upon d/c  [ ]  f/u blood cx - anticipate d/c home tomorrow  # Thrombocytopenia. History of chronically low platelets.  [ ]  monitor CBC  # Diabetes Mellitus:  - on Lantus 10 units QHS with SSI, CBG's well controlled thus far  # Depression: - continue home celexa, risperdal, trazodone   # Alcohol Abuse  - CIWA protocol  # FEN/GI:  -  carb modified diet  - SLIV  - continue home PPI   # Prophylaxis:  - SQ heparin- pt has chronic thrombocytopenia but platelets are stable. Continue to monitor platelets closely. Not a candidate for SCD's due to RLE cellulitis.   # Dispo:  - pending clinical improvement  - anticipate d/c home tomorrow on IV vanc and PO cipro  # Code Status: full code per prior admission   Levert Feinstein, MD  Family Medicine PGY-1  Service Pager [phone]   Levert Feinstein, MD Family Medicine PGY-1 Service Pager [phone]

## 2012-07-21 NOTE — Care Management Note (Signed)
  Page 1 of 1   07/21/2012     1:06:44 PM   CARE MANAGEMENT NOTE 07/21/2012  Patient:  Mitchell [name], Mitchell [name]   Account Number:  [id number]  Date Initiated:  07/21/2012  Documentation initiated by:  Ronny Flurry  Subjective/Objective Assessment:     Action/Plan:   Anticipated DC Date:  07/22/2012   Anticipated DC Plan:  HOME W HOME HEALTH SERVICES         Choice offered to / List presented to:  [name]        HH arranged  HH-1 RN      [facility] agency  [facility]   Status of service:  Completed, signed off Medicare Important Message given?   (If response is "NO", the following Medicare IM given date fields will be blank) Date Medicare IM given:   Date Additional Medicare IM given:    Discharge Disposition:  HOME W HOME HEALTH SERVICES  Per UR Regulation:    If discussed at Long Length of Stay Meetings, dates discussed:    Comments:  07-21-12 Active with [facility] . Advanced aware planned discharge for 07-22-12 . Ronny Flurry RN BSN [phone]

## 2012-07-21 NOTE — Progress Notes (Signed)
FMTS Attending Daily Note:  Renold Don MD  [phone] pager  Family Practice pager:  [phone] I have seen and examined this patient and have reviewed their chart. I have discussed this patient with the resident. I agree with the resident's findings, assessment and care plan.  Additionally: - Leg much improved today from report from clinic yesterday. - Actually looks better today than when he was discharged home earlier this week - Do not actually think this is treatment failure of Vanc.  Needs anti-pseudomonal coverage as diabetic.  He improved rapidly s/p addition of Zosyn.   - Switching to cipro today and will watch on PO antibiotics overnight.  If continues doing well, home tomorrow.   Tobey Grim, MD 07/21/2012 1:54 PM

## 2012-07-22 DIAGNOSIS — E118 Type 2 diabetes mellitus with unspecified complications: Secondary | ICD-10-CM

## 2012-07-22 DIAGNOSIS — M7989 Other specified soft tissue disorders: Secondary | ICD-10-CM

## 2012-07-22 DIAGNOSIS — F329 Major depressive disorder, single episode, unspecified: Secondary | ICD-10-CM

## 2012-07-22 DIAGNOSIS — F1020 Alcohol dependence, uncomplicated: Secondary | ICD-10-CM

## 2012-07-22 LAB — CBC
MCHC: 35.4 g/dL (ref 30.0–36.0)
Platelets: 84 10*3/uL — ABNORMAL LOW (ref 150–400)
RDW: 13.8 % (ref 11.5–15.5)
WBC: 2.9 10*3/uL — ABNORMAL LOW (ref 4.0–10.5)

## 2012-07-22 LAB — GLUCOSE, CAPILLARY
Glucose-Capillary: 140 mg/dL — ABNORMAL HIGH (ref 70–99)
Glucose-Capillary: 124 mg/dL — ABNORMAL HIGH (ref 70–99)

## 2012-07-22 LAB — BASIC METABOLIC PANEL
BUN: 8 mg/dL (ref 6–23)
Calcium: 8.4 mg/dL (ref 8.4–10.5)
Creatinine, Ser: 0.88 mg/dL (ref 0.50–1.35)
GFR calc Af Amer: >90 mL/min (ref >90)
GFR calc non Af Amer: >90 mL/min (ref >90)

## 2012-07-22 MED ORDER — HEPARIN SOD (PORK) LOCK FLUSH 100 UNIT/ML IV SOLN
250.0000 [IU] | INTRAVENOUS | Status: AC | PRN
  Administered 2012-07-22: 250 [IU]

## 2012-07-22 MED ORDER — CIPROFLOXACIN HCL 500 MG PO TABS: 500.0000 mg | ORAL_TABLET | Freq: Two times a day (BID) | ORAL | Status: DC

## 2012-07-22 NOTE — Progress Notes (Signed)
   CARE MANAGEMENT NOTE 07/22/2012  Patient:  Mitchell [name], Mitchell [name]   Account Number:  [id number]  Date Initiated:  07/21/2012  Documentation initiated by:  Ronny Flurry  Subjective/Objective Assessment:     Action/Plan:   Anticipated DC Date:  07/22/2012   Anticipated DC Plan:  HOME W HOME HEALTH SERVICES         [facility] Choice  Resumption Of Svcs/PTA Provider   Choice offered to / List presented to:  [name]        HH arranged  HH-1 RN      [facility] agency  [facility]   Status of service:  Completed, signed off Medicare Important Message given?   (If response is "NO", the following Medicare IM given date fields will be blank) Date Medicare IM given:   Date Additional Medicare IM given:    Discharge Disposition:  HOME W HOME HEALTH SERVICES  Per UR Regulation:    If discussed at Long Length of Stay Meetings, dates discussed:    Comments:  07/22/2012 1500 Made [facility] aware of pt's scheduled dc home today with IV abx. Pt has IV abx at home and he is able to administer meds. He is aware of schedule for IV abx. Unit RN did use interpreter for Spanish speaking pt. NCM requested [facility] RN visit today or 6/29.  Isidoro Donning RN CCM Case Mgmt phone [phone]  07-21-12 Active with [facility] . Advanced aware planned discharge for 07-22-12 . Ronny Flurry RN BSN [phone]

## 2012-07-22 NOTE — Discharge Summary (Signed)
Family Medicine Teaching [facility] Discharge Summary  Patient name: Mitchell [name] Medical record number: [id number] Date of birth: 1975/02/10 Age: 37 y.o. Gender: male Date of Admission: 07/20/2012  Date of Discharge: 07/22/2012 Admitting Physician: Sanjuana Letters, MD  Primary Care Provider: [name], MD  Indication for Hospitalization: RLE cellulitis  Discharge Diagnoses:  1. RLE cellulitis, concern for chronic osteomyelitis 2. Thrombocytopenia 3. Diabetes Mellitus 4. Depression  Brief Hospital Course:   Mitchell [name] is a 37 y.o. year old male admitted to the hospital directly from the [facility] after presenting with worsening RLE cellulitis in setting of failing outpatient vancomycin through PICC.   # RLE Cellulitis: readmitted from clinic after discharge a few days before, with just IV vancomycin which was given out of concern for chronic osteomyelitis. Outpatient doppler negative for DVT. Pt was continued on home vancomycin and added zosyn for gram negative coverage. He clinically improved and was transitioned to vancomycin and cipro. Tramadol & IV morphine prn pain. Blood culture was drawn and was pending at time of discharge. Pt was d/c'd on six weeks of IV vancomycin (with HH RN through [facility]) and 2 weeks of PO ciprofloxacin. Recommend clinical f/u of cellulitis improvement, and possible continuation of PO ciprofloxacin beyond 2 weeks time if not completely improved.  # Thrombocytopenia and leukopenia. History of chronically low platelets & WBC. At pt's baseline while hospitalized. Recommend continued monitoring of CBC as outpatient.   # Diabetes Mellitus: Given lantus and SSI  # Depression: continued home celexa, risperdal, trazodone. Denied SI/HI on day of discharge.  # Alcohol Abuse- monitored on CIWA protocol, did not require any ativan  Consultations: none  Procedures: none  Significant Labs and Imaging:  CBC  Recent Labs Lab 07/20/12 1036 07/20/12 1400  07/22/12 1150  WBC 6.7 6.1 2.9*  HGB 13.4 12.3* 11.2*  HCT 37.2* 34.0* 31.6*  PLT 115* 108* 84*     BMET  Recent Labs Lab 07/20/12 1036 07/20/12 1400 07/22/12 1150  NA 139  --  135  K 3.5  --  3.5  CL 104  --  101  CO2 23  --  26  BUN 7  --  8  CREATININE 0.82 0.87 0.88  GLUCOSE 124*  --  135*  CALCIUM 8.9  --  8.4        Discharge Medications:    Medication List         ciprofloxacin 500 MG tablet  Commonly known as:  CIPRO  Take 1 tablet (500 mg total) by mouth 2 (two) times daily.     citalopram 40 MG tablet  Commonly known as:  CELEXA  Take 1 tablet (40 mg total) by mouth daily. For depression     glimepiride 4 MG tablet  Commonly known as:  AMARYL  Take 1 tablet (4 mg total) by mouth daily before breakfast. For control of high blood sugar (Diabetes)     insulin aspart 100 UNIT/ML injection  Commonly known as:  novoLOG  Inject 6 Units into the skin 3 (three) times daily with meals. For high blood sugar control     insulin glargine 100 UNIT/ML injection  Commonly known as:  LANTUS  Inject 0.18 mLs (18 Units total) into the skin at bedtime. For high blood sugar control     metFORMIN 1000 MG tablet  Commonly known as:  GLUCOPHAGE  Take 1 tablet (1,000 mg total) by mouth 2 (two) times daily with a meal. For control of high blood sugar (Diabetes)  multivitamin with minerals Tabs  Take 1 tablet by mouth daily. For for low vitamin     omeprazole 20 MG capsule  Commonly known as:  PRILOSEC  Take 1 capsule (20 mg total) by mouth daily. For acid reflux     risperiDONE 1 MG tablet  Commonly known as:  RISPERDAL  Take 1 tablet (1 mg total) by mouth at bedtime. For mood control     traZODone 50 MG tablet  Commonly known as:  DESYREL  Take 1 tablet (50 mg total) by mouth at bedtime and may repeat dose one time if needed. For sleep     vancomycin 1 GM/200ML Soln  Commonly known as:  VANCOCIN  Inject 200 mLs (1,000 mg total) into the vein every 8  (eight) hours. Per Home Health ([facility])        Issues for Follow Up:  - recommend f/u of chronic leukopenia & thrombocytopenia with repeat CBC - will get HH PT eval by [facility], also [facility] of [facility] abx through [facility] - f/u blood cx - consider continuation of ciprofloxacin (was given rx just through time of f/u appt with Dr. Lajoyce Corners)  Outstanding Results: blood cx (NGTD at time of d/c)      Follow-up Information   Follow up with [facility] . [facility] RN, Physical Therapy, and IV antibiotics)    Contact information:   [phone]      Follow up with Felix Pacini, DO On 07/26/2012. (at 2:30pm to meet new primary doctor)    Contact information:   [address] [place] [address] [phone]       Follow up with Nadara Mustard, MD On 07/31/2012. (at 1:30pm (orthopedic doctor))    Contact information:   [address] [place] [address] [phone]       Discharge Condition: Stable  Discharge Disposition: Home  Discharge Instructions: Please refer to Patient Instructions section of EMR for full details.  Patient was counseled important signs and symptoms that should prompt return to medical care, changes in medications, dietary instructions, activity restrictions, and follow up appointments.    Levert Feinstein, MD Family Medicine PGY-1

## 2012-07-22 NOTE — Progress Notes (Signed)
Discharge instructions given to patient via pacific interpreter.  He will be going home with PICC line and will continue IV Vancomycin with home health assisstance ( [facility] ).

## 2012-07-22 NOTE — Discharge Summary (Signed)
Family Medicine Teaching Service  Discharge Note : Attending Jeff Walden MD Pager [phone] Inpatient Team Pager:  [phone]  I have reviewed this patient and the patient's chart and have discussed discharge planning with the resident at the time of discharge. I agree with the discharge plan as above.    

## 2012-07-22 NOTE — Progress Notes (Signed)
FMTS Attending Daily Note:  Mitchell Don MD  [phone] pager  Family Practice pager:  [phone] I have seen and examined this patient and have reviewed their chart. I have discussed this patient with the resident. I agree with the resident's findings, assessment and care plan.  Additionally:  - Continues great improvement off IV antibiotics.   Ready for DC home today. - Would not call this a Vanc failure, as patient exhibited rapid (<12 hour) improvement with addition of pseudomonal/GNR coverage.   - Would continue this at DC for at least 10 days.   - Agree with Ortho that Vanc necessary for limb salvage from chronic osteo.  However this most recent infection seems to be cellulitis truly limited to skin and therefore he probably does not warrant full 6 weeks of Cipro.    Tobey Grim, MD 07/22/2012 1:21 PM

## 2012-07-22 NOTE — Progress Notes (Signed)
Family Medicine Teaching Service Daily Progress Note Service Pager: [phone]  Subjective: Pt reports feeling well. Tolerating PO. Denies SI/HI  Objective: Temp:  [97.2 F (36.2 C)-97.7 F (36.5 C)] 97.6 F (36.4 C) (06/28 0618) Pulse Rate:  [58-65] 61 (06/28 0618) Resp:  [17-19] 19 (06/28 0618) BP: (96-110)/(58-72) 96/61 mmHg (06/28 0618) SpO2:  [95 %-97 %] 95 % (06/28 0618) Exam: General: NAD Cardiovascular: RRR Respiratory: CTAB, NWOB Abdomen: soft, nontender to palpation Extremities: RLE appears scarred as previously noted. Minimal, if any erythema in leg/ankle. Nontender to palpation. Left leg is normal without erythema or tenderness.  I have reviewed the patient's medications, labs, imaging, and diagnostic testing.  Notable results are summarized below.  Medications:  Scheduled Meds: . ciprofloxacin  500 mg Oral BID  . citalopram  40 mg Oral Daily  . folic acid  1 mg Oral Daily  . heparin  5,000 Units Subcutaneous Q8H  . insulin aspart  0-15 Units Subcutaneous TID WC  . insulin aspart  0-5 Units Subcutaneous QHS  . insulin glargine  10 Units Subcutaneous QHS  . multivitamin with minerals  1 tablet Oral Daily  . pantoprazole  40 mg Oral Daily  . pneumococcal 23 valent vaccine  0.5 mL Intramuscular Tomorrow-1000  . risperiDONE  1 mg Oral QHS  . sodium chloride  10-40 mL Intracatheter Q12H  . thiamine  100 mg Oral Daily   Or  . thiamine  100 mg Intravenous Daily  . traZODone  50 mg Oral QHS,MR X 1  . vancomycin  1,000 mg Intravenous Q8H   Continuous Infusions:  PRN Meds:.HYDROcodone-acetaminophen, LORazepam, LORazepam, morphine injection, sodium chloride  Labs:  CBC  Recent Labs Lab 07/18/12 0555 07/20/12 1036 07/20/12 1400  WBC 5.1 6.7 6.1  HGB 12.9* 13.4 12.3*  HCT 35.0* 37.2* 34.0*  PLT 107* 115* 108*     BMET  Recent Labs Lab 07/16/12 0535 07/17/12 0500 07/20/12 1036 07/20/12 1400  NA 133* 136 139  --   K 3.3* 3.6 3.5  --   CL 99 100 104   --   CO2 25 25 23   --   BUN 9 7 7   --   CREATININE 0.81 0.90 0.82 0.87  GLUCOSE 176* 189* 124*  --   CALCIUM 8.5 8.8 8.9  --        Assessment/Plan:   Mitchell [name] is a 37 y.o. year old male presenting with worsening RLE cellulitis after failing outpatient vancomycin through PICC.   # RLE Cellulitis: doppler negative for DVT yesterday  - tramadol & IV morphine prn pain - currently on vancomycin via PICC and cipro PO [ ]  f/u blood cx - d/c home today  # Thrombocytopenia. History of chronically low platelets.  [ ]  monitor CBC as outpatient  # Diabetes Mellitus:  - on Lantus 10 units QHS with SSI, CBG's well controlled thus far  # Depression: - continue home celexa, risperdal, trazodone   # Alcohol Abuse  - CIWA protocol, all scores 0  # FEN/GI:  - carb modified diet  - SLIV  - continue home PPI   # Prophylaxis:  - SQ heparin- pt has chronic thrombocytopenia but platelets are stable. Continue to monitor platelets closely. Not a candidate for SCD's due to RLE cellulitis.   # Dispo:  - anticipate d/c home today  # Code Status: full code per prior admission   Levert Feinstein, MD  [facility] Medicine PGY-1  Service Pager [phone]

## 2012-07-25 NOTE — Discharge Summary (Signed)
Family Medicine Teaching Service  Discharge Note : Attending Jeff Lakely Elmendorf MD Pager [phone] Inpatient Team Pager:  [phone]  I have reviewed this patient and the patient's chart and have discussed discharge planning with the resident at the time of discharge. I agree with the discharge plan as above.    

## 2012-07-26 ENCOUNTER — Ambulatory Visit (INDEPENDENT_AMBULATORY_CARE_PROVIDER_SITE_OTHER): Payer: No Typology Code available for payment source | Admitting: Family Medicine

## 2012-07-26 ENCOUNTER — Encounter: Payer: Self-pay | Admitting: Family Medicine

## 2012-07-26 VITALS — BP 122/76 | HR 71 | Temp 99.1°F | Ht 64.0 in | Wt 178.0 lb

## 2012-07-26 DIAGNOSIS — F1020 Alcohol dependence, uncomplicated: Secondary | ICD-10-CM

## 2012-07-26 DIAGNOSIS — L03119 Cellulitis of unspecified part of limb: Secondary | ICD-10-CM

## 2012-07-26 LAB — CBC WITH DIFFERENTIAL/PLATELET
Basophils Absolute: 0 10*3/uL (ref 0.0–0.1)
Basophils Relative: 1 % (ref 0–1)
Eosinophils Relative: 2 % (ref 0–5)
Lymphocytes Relative: 29 % (ref 12–46)
MCHC: 34.8 g/dL (ref 30.0–36.0)
Monocytes Absolute: 0.4 10*3/uL (ref 0.1–1.0)
Neutro Abs: 3 10*3/uL (ref 1.7–7.7)
Platelets: 104 10*3/uL — ABNORMAL LOW (ref 150–400)
RDW: 14.7 % (ref 11.5–15.5)
WBC: 4.9 10*3/uL (ref 4.0–10.5)

## 2012-07-26 NOTE — Patient Instructions (Signed)
Cellulitis Cellulitis is an infection of the skin and the tissue beneath it. The infected area is usually red and tender. Cellulitis occurs most often in the arms and lower legs.  CAUSES  Cellulitis is caused by bacteria that enter the skin through cracks or cuts in the skin. The most common types of bacteria that cause cellulitis are Staphylococcus and Streptococcus. SYMPTOMS   Redness and warmth.  Swelling.  Tenderness or pain.  Fever. DIAGNOSIS  Your caregiver can usually determine what is wrong based on a physical exam. Blood tests may also be done. TREATMENT  Treatment usually involves taking an antibiotic medicine. HOME CARE INSTRUCTIONS   Take your antibiotics as directed. Finish them even if you start to feel better.  Keep the infected arm or leg elevated to reduce swelling.  Apply a warm cloth to the affected area up to 4 times per day to relieve pain.  Only take over-the-counter or prescription medicines for pain, discomfort, or fever as directed by your caregiver.  Keep all follow-up appointments as directed by your caregiver. SEEK MEDICAL CARE IF:   You notice red streaks coming from the infected area.  Your red area gets larger or turns dark in color.  Your bone or joint underneath the infected area becomes painful after the skin has healed.  Your infection returns in the same area or another area.  You notice a swollen bump in the infected area.  You develop new symptoms. SEEK IMMEDIATE MEDICAL CARE IF:   You have a fever.  You feel very sleepy.  You develop vomiting or diarrhea.  You have a general ill feeling (malaise) with muscle aches and pains. MAKE SURE YOU:   Understand these instructions.  Will watch your condition.  Will get help right away if you are not doing well or get worse. Document Released: 10/21/2004 Document Revised: 07/13/2011 Document Reviewed: 03/29/2011 [employer] Patient Information 2014 [employer].  
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
## 2012-07-26 NOTE — Assessment & Plan Note (Addendum)
-   Patient reports no ETOH in 3 months. Sounds as if they sent him to a detox facility, but he was unaware of name or location.  - ? Cause of thrombocytopenia - repeat labs today

## 2012-07-26 NOTE — Assessment & Plan Note (Signed)
-   Patient is healing well. No signs of infection today. - continue Cipro until he Duda appt in 5 days - keep Lajoyce Corners appt - Continue IV VANC for 6 weeks total therapy - Appt with me in 2 weeks

## 2012-07-27 LAB — CULTURE, BLOOD (ROUTINE X 2)
Culture: NO GROWTH
Culture: NO GROWTH

## 2012-07-29 ENCOUNTER — Encounter: Payer: Self-pay | Admitting: Family Medicine

## 2012-07-29 DIAGNOSIS — D649 Anemia, unspecified: Secondary | ICD-10-CM | POA: Insufficient documentation

## 2012-08-08 ENCOUNTER — Ambulatory Visit: Payer: Self-pay

## 2012-08-10 ENCOUNTER — Emergency Department (HOSPITAL_COMMUNITY): Payer: No Typology Code available for payment source

## 2012-08-10 ENCOUNTER — Emergency Department (HOSPITAL_COMMUNITY)
Admission: EM | Admit: 2012-08-10 | Discharge: 2012-08-12 | Disposition: A | Discharge status: Home / Self Care | Payer: No Typology Code available for payment source | Attending: Dermatology | Admitting: Dermatology

## 2012-08-10 ENCOUNTER — Encounter (HOSPITAL_COMMUNITY): Payer: Self-pay

## 2012-08-10 DIAGNOSIS — Z794 Long term (current) use of insulin: Secondary | ICD-10-CM | POA: Insufficient documentation

## 2012-08-10 DIAGNOSIS — L02419 Cutaneous abscess of limb, unspecified: Secondary | ICD-10-CM

## 2012-08-10 DIAGNOSIS — Z8719 Personal history of other diseases of the digestive system: Secondary | ICD-10-CM | POA: Insufficient documentation

## 2012-08-10 DIAGNOSIS — Z8739 Personal history of other diseases of the musculoskeletal system and connective tissue: Secondary | ICD-10-CM | POA: Insufficient documentation

## 2012-08-10 DIAGNOSIS — F3289 Other specified depressive episodes: Secondary | ICD-10-CM | POA: Insufficient documentation

## 2012-08-10 DIAGNOSIS — D649 Anemia, unspecified: Secondary | ICD-10-CM

## 2012-08-10 DIAGNOSIS — F1010 Alcohol abuse, uncomplicated: Secondary | ICD-10-CM

## 2012-08-10 DIAGNOSIS — R45851 Suicidal ideations: Secondary | ICD-10-CM | POA: Insufficient documentation

## 2012-08-10 DIAGNOSIS — S0993XA Unspecified injury of face, initial encounter: Secondary | ICD-10-CM | POA: Insufficient documentation

## 2012-08-10 DIAGNOSIS — E118 Type 2 diabetes mellitus with unspecified complications: Secondary | ICD-10-CM

## 2012-08-10 DIAGNOSIS — R51 Headache: Secondary | ICD-10-CM | POA: Insufficient documentation

## 2012-08-10 DIAGNOSIS — Z87828 Personal history of other (healed) physical injury and trauma: Secondary | ICD-10-CM | POA: Insufficient documentation

## 2012-08-10 DIAGNOSIS — F32A Depression, unspecified: Secondary | ICD-10-CM

## 2012-08-10 DIAGNOSIS — E119 Type 2 diabetes mellitus without complications: Secondary | ICD-10-CM | POA: Insufficient documentation

## 2012-08-10 DIAGNOSIS — Z79899 Other long term (current) drug therapy: Secondary | ICD-10-CM | POA: Insufficient documentation

## 2012-08-10 DIAGNOSIS — F329 Major depressive disorder, single episode, unspecified: Secondary | ICD-10-CM

## 2012-08-10 DIAGNOSIS — F1020 Alcohol dependence, uncomplicated: Secondary | ICD-10-CM

## 2012-08-10 DIAGNOSIS — K219 Gastro-esophageal reflux disease without esophagitis: Secondary | ICD-10-CM | POA: Insufficient documentation

## 2012-08-10 DIAGNOSIS — F39 Unspecified mood [affective] disorder: Secondary | ICD-10-CM | POA: Insufficient documentation

## 2012-08-10 LAB — CBC WITH DIFFERENTIAL/PLATELET
Basophils Relative: 1 % (ref 0–1)
Eosinophils Absolute: 0.1 10*3/uL (ref 0.0–0.7)
Hemoglobin: 14.4 g/dL (ref 13.0–17.0)
Lymphocytes Relative: 44 % (ref 12–46)
MCH: 30.8 pg (ref 26.0–34.0)
MCHC: 37 g/dL — ABNORMAL HIGH (ref 30.0–36.0)
Monocytes Absolute: 0.5 10*3/uL (ref 0.1–1.0)
Neutrophils Relative %: 48 % (ref 43–77)
Platelets: 115 10*3/uL — ABNORMAL LOW (ref 150–400)
RBC: 4.67 MIL/uL (ref 4.22–5.81)

## 2012-08-10 LAB — RAPID URINE DRUG SCREEN, HOSP PERFORMED
Amphetamines: NOT DETECTED
Barbiturates: NOT DETECTED
Benzodiazepines: NOT DETECTED
Tetrahydrocannabinol: NOT DETECTED

## 2012-08-10 LAB — ETHANOL: Alcohol, Ethyl (B): 310 mg/dL — ABNORMAL HIGH (ref 0–11)

## 2012-08-10 LAB — COMPREHENSIVE METABOLIC PANEL
ALT: 36 U/L (ref 0–53)
AST: 46 U/L — ABNORMAL HIGH (ref 0–37)
Albumin: 4.2 g/dL (ref 3.5–5.2)
Alkaline Phosphatase: 114 U/L (ref 39–117)
Calcium: 8.8 mg/dL (ref 8.4–10.5)
GFR calc Af Amer: >90 mL/min (ref >90)
Potassium: 3.3 mEq/L — ABNORMAL LOW (ref 3.5–5.1)
Sodium: 140 mEq/L (ref 135–145)
Total Protein: 9.2 g/dL — ABNORMAL HIGH (ref 6.0–8.3)

## 2012-08-10 LAB — GLUCOSE, CAPILLARY: Glucose-Capillary: 203 mg/dL — ABNORMAL HIGH (ref 70–99)

## 2012-08-10 MED ORDER — CITALOPRAM HYDROBROMIDE 40 MG PO TABS
40.0000 mg | ORAL_TABLET | Freq: Every day | ORAL | Status: DC
  Administered 2012-08-10 – 2012-08-12 (×3): 40 mg via ORAL
  Filled 2012-08-10 (×3): qty 1

## 2012-08-10 MED ORDER — INSULIN ASPART 100 UNIT/ML ~~LOC~~ SOLN
6.0000 [IU] | Freq: Three times a day (TID) | SUBCUTANEOUS | Status: DC
  Administered 2012-08-11 – 2012-08-12 (×3): 6 [IU] via SUBCUTANEOUS
  Filled 2012-08-10 (×3): qty 1
  Filled 2012-08-10: qty 6

## 2012-08-10 MED ORDER — PANTOPRAZOLE SODIUM 40 MG PO TBEC
40.0000 mg | DELAYED_RELEASE_TABLET | Freq: Every day | ORAL | Status: DC
  Administered 2012-08-10 – 2012-08-12 (×3): 40 mg via ORAL
  Filled 2012-08-10 (×3): qty 1

## 2012-08-10 MED ORDER — VANCOMYCIN HCL IN DEXTROSE 1-5 GM/200ML-% IV SOLN
1000.0000 mg | Freq: Three times a day (TID) | INTRAVENOUS | Status: DC
  Administered 2012-08-10: 1000 mg via INTRAVENOUS
  Filled 2012-08-10 (×3): qty 200

## 2012-08-10 MED ORDER — POTASSIUM CHLORIDE CRYS ER 20 MEQ PO TBCR
40.0000 meq | EXTENDED_RELEASE_TABLET | Freq: Once | ORAL | Status: AC
  Administered 2012-08-10: 40 meq via ORAL
  Filled 2012-08-10: qty 2

## 2012-08-10 MED ORDER — METFORMIN HCL 500 MG PO TABS
1000.0000 mg | ORAL_TABLET | Freq: Two times a day (BID) | ORAL | Status: DC
  Administered 2012-08-11 – 2012-08-12 (×3): 1000 mg via ORAL
  Filled 2012-08-10 (×6): qty 2

## 2012-08-10 MED ORDER — RISPERIDONE 1 MG PO TABS
1.0000 mg | ORAL_TABLET | Freq: Every day | ORAL | Status: DC
  Administered 2012-08-10 – 2012-08-11 (×2): 1 mg via ORAL
  Filled 2012-08-10 (×2): qty 1

## 2012-08-10 MED ORDER — INSULIN GLARGINE 100 UNIT/ML ~~LOC~~ SOLN
18.0000 [IU] | Freq: Every day | SUBCUTANEOUS | Status: DC
  Administered 2012-08-10 – 2012-08-11 (×2): 18 [IU] via SUBCUTANEOUS
  Filled 2012-08-10 (×4): qty 0.18

## 2012-08-10 MED ORDER — GLIMEPIRIDE 4 MG PO TABS
4.0000 mg | ORAL_TABLET | Freq: Every day | ORAL | Status: DC
  Administered 2012-08-11 – 2012-08-12 (×2): 4 mg via ORAL
  Filled 2012-08-10 (×3): qty 1

## 2012-08-10 NOTE — ED Notes (Signed)
Pt state he does not want to harm anyone or himself.
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
## 2012-08-11 ENCOUNTER — Ambulatory Visit: Payer: Self-pay | Admitting: Family Medicine

## 2012-08-11 DIAGNOSIS — F1020 Alcohol dependence, uncomplicated: Secondary | ICD-10-CM

## 2012-08-11 DIAGNOSIS — F329 Major depressive disorder, single episode, unspecified: Secondary | ICD-10-CM

## 2012-08-11 LAB — GLUCOSE, CAPILLARY
Glucose-Capillary: 185 mg/dL — ABNORMAL HIGH (ref 70–99)
Glucose-Capillary: 168 mg/dL — ABNORMAL HIGH (ref 70–99)
Glucose-Capillary: 159 mg/dL — ABNORMAL HIGH (ref 70–99)

## 2012-08-11 LAB — VANCOMYCIN, TROUGH: Vancomycin Tr: 6.4 ug/mL — ABNORMAL LOW (ref 10.0–20.0)

## 2012-08-11 MED ORDER — ONDANSETRON 4 MG PO TBDP: 4.0000 mg | ORAL_TABLET | Freq: Four times a day (QID) | ORAL | Status: DC | PRN

## 2012-08-11 MED ORDER — CHLORDIAZEPOXIDE HCL 25 MG PO CAPS: 25.0000 mg | ORAL_CAPSULE | Freq: Every day | ORAL | Status: DC

## 2012-08-11 MED ORDER — CHLORDIAZEPOXIDE HCL 25 MG PO CAPS
25.0000 mg | ORAL_CAPSULE | Freq: Four times a day (QID) | ORAL | Status: AC
  Administered 2012-08-11 (×4): 25 mg via ORAL
  Filled 2012-08-11 (×4): qty 1

## 2012-08-11 MED ORDER — CHLORDIAZEPOXIDE HCL 25 MG PO CAPS: 25.0000 mg | ORAL_CAPSULE | Freq: Four times a day (QID) | ORAL | Status: DC | PRN

## 2012-08-11 MED ORDER — THIAMINE HCL 100 MG/ML IJ SOLN
100.0000 mg | Freq: Once | INTRAMUSCULAR | Status: AC
  Administered 2012-08-11: 100 mg via INTRAMUSCULAR
  Filled 2012-08-11: qty 2

## 2012-08-11 MED ORDER — VITAMIN B-1 100 MG PO TABS
100.0000 mg | ORAL_TABLET | Freq: Every day | ORAL | Status: DC
  Administered 2012-08-12: 100 mg via ORAL
  Filled 2012-08-11 (×2): qty 1

## 2012-08-11 MED ORDER — IBUPROFEN 800 MG PO TABS
800.0000 mg | ORAL_TABLET | Freq: Once | ORAL | Status: AC
  Administered 2012-08-11: 800 mg via ORAL
  Filled 2012-08-11: qty 1

## 2012-08-11 MED ORDER — LOPERAMIDE HCL 2 MG PO CAPS: 2.0000 mg | ORAL_CAPSULE | ORAL | Status: DC | PRN

## 2012-08-11 MED ORDER — TRAMADOL HCL 50 MG PO TABS
50.0000 mg | ORAL_TABLET | Freq: Once | ORAL | Status: AC
  Administered 2012-08-11: 50 mg via ORAL

## 2012-08-11 MED ORDER — CHLORDIAZEPOXIDE HCL 25 MG PO CAPS
25.0000 mg | ORAL_CAPSULE | Freq: Once | ORAL | Status: AC
  Administered 2012-08-11: 25 mg via ORAL
  Filled 2012-08-11: qty 1

## 2012-08-11 MED ORDER — VANCOMYCIN HCL IN DEXTROSE 1-5 GM/200ML-% IV SOLN
1000.0000 mg | Freq: Three times a day (TID) | INTRAVENOUS | Status: DC
  Administered 2012-08-11 – 2012-08-12 (×4): 1000 mg via INTRAVENOUS
  Filled 2012-08-11 (×6): qty 200

## 2012-08-11 MED ORDER — CHLORDIAZEPOXIDE HCL 25 MG PO CAPS
25.0000 mg | ORAL_CAPSULE | Freq: Three times a day (TID) | ORAL | Status: DC
  Administered 2012-08-12: 25 mg via ORAL
  Filled 2012-08-11: qty 1

## 2012-08-11 MED ORDER — ACETAMINOPHEN 325 MG PO TABS
650.0000 mg | ORAL_TABLET | Freq: Once | ORAL | Status: AC
  Administered 2012-08-11: 650 mg via ORAL
  Filled 2012-08-11: qty 2

## 2012-08-11 MED ORDER — ADULT MULTIVITAMIN W/MINERALS CH
1.0000 | ORAL_TABLET | Freq: Every day | ORAL | Status: DC
  Administered 2012-08-11 – 2012-08-12 (×2): 1 via ORAL
  Filled 2012-08-11 (×2): qty 1

## 2012-08-11 MED ORDER — HYDROXYZINE HCL 25 MG PO TABS: 25.0000 mg | ORAL_TABLET | Freq: Four times a day (QID) | ORAL | Status: DC | PRN

## 2012-08-11 MED ORDER — CHLORDIAZEPOXIDE HCL 25 MG PO CAPS: 25.0000 mg | ORAL_CAPSULE | ORAL | Status: DC
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
## 2012-08-11 NOTE — Progress Notes (Signed)
08/11/2012 A. Carrie Usery RNCM 1425pm As per Belenda Cruise of [facility] spoke to patient's son who reported that he hasn't seen the patient in a while so he has probably missed some doses (IV Vancomycin).  Dr. Ranae Palms made aware.

## 2012-08-11 NOTE — Progress Notes (Signed)
08/11/2012 A. Jeanee Fabre RNCM 1500pm As per Timoteo Ace NP for psychiatry, patient is to remain in ED for detox at least until Monday.  He is not cleared by psychiatry to be discharged today.  Dr. Ranae Palms made aware.

## 2012-08-11 NOTE — Progress Notes (Signed)
[facility] has spoken with pt via interpreter. Patient is a part of the [employer] program. His pcp is currently at [facility]. Patient stated that he had a doctors appt there this morning at 10:30. Pt stated that he would like another appt. Talked to [facility] and was able to reschedule his doctor appt for August.

## 2012-08-11 NOTE — ED Notes (Signed)
CBG registered 159 on ED Glucometer.

## 2012-08-11 NOTE — Progress Notes (Signed)
EDCM spoke to Belenda Cruise, Nurse, adult for [facility].  Patient currently has visiting RN services with [facility] for IV Vancomycin therapy.  Last dose of Vancomycin is August 29, 2012, as per [place].  [facility] will follow patient.

## 2012-08-11 NOTE — ED Notes (Signed)
Vancomycin d/c'd until trough level is checked per pharmacy.

## 2012-08-11 NOTE — ED Notes (Signed)
CBG registered 168 on ED Glucometer

## 2012-08-11 NOTE — Progress Notes (Signed)
PHARMACY BRIEF NOTE:  VANCOMYCIN (NON-CONSULT PATIENT)  37 y/o M on vancomycin 1000 mg IV q8h for osteomyelitis of R foot.  SCr yesterday 0.73 Vancomycin trough today 6.4 - low, but was actually drawn 12 hrs after last dose.  Will continue with vancomycin 1000 mg IV q8h as ordered by M.D.   Pharmacy is available to assist with dosing by protocol if desired.  Elie Goody, PharmD, BCPS Pager: [phone] 08/11/2012  10:37 AM

## 2012-08-11 NOTE — BHH Counselor (Signed)
Pt to be discharged from the ED Monday morning to follow up with outpatient referrals.  A residential facility is not an option following patient'Ps D/C due to his PICC line & MRSA.

## 2012-08-11 NOTE — ED Notes (Signed)
[facility] contacted to reassess Pt.

## 2012-08-12 MED ORDER — IBUPROFEN 200 MG PO TABS
400.0000 mg | ORAL_TABLET | Freq: Once | ORAL | Status: AC
  Administered 2012-08-12: 400 mg via ORAL
  Filled 2012-08-12: qty 2

## 2012-08-12 NOTE — BHH Counselor (Signed)
Received call from Amand-SW and she will re-page the spanish interpreter or call her supervisor for assistance. Meanwhile, Clinical research associate suggested to Dr. Lolly Mustache and midlevel that interpreter phone line is utilized for translation.

## 2012-08-12 NOTE — BHH Counselor (Signed)
Dr. Lolly Mustache would like to evaluate patient. He is requesting a spanish interpreter. Contacted SW-Amanda and asked for a spanish interpreter. Marchelle Folks sts she will call and make arrangements.
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
## 2012-08-12 NOTE — BHH Counselor (Signed)
No interpreter has called and Dr. Lolly Mustache would like an update. Call SW-Amanda but no answer; only able to leave a voicemail.

## 2012-08-12 NOTE — BHH Counselor (Signed)
After numerous attempts to reach the SW-Amanda regarding the spanish interpreter, no answer. Secretary suggested this Clinical research associate to [facility] [phone]. [facility] whom gave a SW # [phone]. Writer paged the number but no return call. Writer then called [facility] to speak to the SW for assistance. Writer transferred to the SW at [facility] and no answer.

## 2012-08-12 NOTE — BHH Counselor (Signed)
Mitchell [name] sts that she will have the spanish interpreter call the nurses station with ETA.

## 2012-08-12 NOTE — ED Notes (Signed)
Pt denies thoughts of SI or HI

## 2012-08-12 NOTE — BHH Counselor (Signed)
Dr. Lolly Mustache will use the interpreter line to assist with the assessment.

## 2012-08-12 NOTE — ED Provider Notes (Signed)
Patient seen by the psychiatrist and cleared for discharge today  Toy Baker, MD 08/12/12 1410

## 2012-08-21 ENCOUNTER — Emergency Department (HOSPITAL_COMMUNITY): Payer: No Typology Code available for payment source

## 2012-08-21 ENCOUNTER — Encounter (HOSPITAL_COMMUNITY): Payer: Self-pay | Admitting: Emergency Medicine

## 2012-08-21 DIAGNOSIS — Z8719 Personal history of other diseases of the digestive system: Secondary | ICD-10-CM | POA: Insufficient documentation

## 2012-08-21 DIAGNOSIS — M79609 Pain in unspecified limb: Secondary | ICD-10-CM | POA: Insufficient documentation

## 2012-08-21 DIAGNOSIS — F489 Nonpsychotic mental disorder, unspecified: Secondary | ICD-10-CM | POA: Insufficient documentation

## 2012-08-21 DIAGNOSIS — K219 Gastro-esophageal reflux disease without esophagitis: Secondary | ICD-10-CM | POA: Insufficient documentation

## 2012-08-21 DIAGNOSIS — E119 Type 2 diabetes mellitus without complications: Secondary | ICD-10-CM | POA: Insufficient documentation

## 2012-08-21 DIAGNOSIS — F1010 Alcohol abuse, uncomplicated: Secondary | ICD-10-CM | POA: Insufficient documentation

## 2012-08-21 DIAGNOSIS — Z794 Long term (current) use of insulin: Secondary | ICD-10-CM | POA: Insufficient documentation

## 2012-08-21 DIAGNOSIS — F3289 Other specified depressive episodes: Secondary | ICD-10-CM | POA: Insufficient documentation

## 2012-08-21 DIAGNOSIS — Z79899 Other long term (current) drug therapy: Secondary | ICD-10-CM | POA: Insufficient documentation

## 2012-08-21 DIAGNOSIS — F329 Major depressive disorder, single episode, unspecified: Secondary | ICD-10-CM | POA: Insufficient documentation

## 2012-08-21 NOTE — ED Notes (Addendum)
PT. REPORTS PAIN AROUND  PICC LINE INSERTION SITE ONSET TODAY , NO DRAINAGE , DENIES FEVER OR CHILLS , SON TRANSLATING ( SPANISH) FOR PT. PT. USES PICC LINE FOR ANTIBIOTIC THERAPY - LEG INFECTION .

## 2012-08-22 ENCOUNTER — Emergency Department (HOSPITAL_COMMUNITY)
Admission: EM | Admit: 2012-08-22 | Discharge: 2012-08-22 | Disposition: A | Discharge status: Home / Self Care | Payer: No Typology Code available for payment source | Attending: Emergency Medicine | Admitting: Emergency Medicine

## 2012-08-22 DIAGNOSIS — Z992 Dependence on renal dialysis: Secondary | ICD-10-CM

## 2012-08-22 DIAGNOSIS — M79609 Pain in unspecified limb: Secondary | ICD-10-CM

## 2012-08-22 LAB — CBC WITH DIFFERENTIAL/PLATELET
Eosinophils Absolute: 0.1 10*3/uL (ref 0.0–0.7)
Eosinophils Relative: 3 % (ref 0–5)
Hemoglobin: 12.2 g/dL — ABNORMAL LOW (ref 13.0–17.0)
Lymphocytes Relative: 32 % (ref 12–46)
Lymphs Abs: 1.5 10*3/uL (ref 0.7–4.0)
MCH: 31.4 pg (ref 26.0–34.0)
MCV: 85.8 fL (ref 78.0–100.0)
Monocytes Relative: 11 % (ref 3–12)
Neutrophils Relative %: 54 % (ref 43–77)
RBC: 3.88 MIL/uL — ABNORMAL LOW (ref 4.22–5.81)
WBC: 4.8 10*3/uL (ref 4.0–10.5)

## 2012-08-22 LAB — POCT I-STAT, CHEM 8
Calcium, Ion: 1.2 mmol/L (ref 1.12–1.23)
Chloride: 102 mEq/L (ref 96–112)
Glucose, Bld: 259 mg/dL — ABNORMAL HIGH (ref 70–99)
HCT: 36 % — ABNORMAL LOW (ref 39.0–52.0)

## 2012-08-22 MED ORDER — DIPHENHYDRAMINE HCL 25 MG PO CAPS
25.0000 mg | ORAL_CAPSULE | Freq: Once | ORAL | Status: AC
  Administered 2012-08-22: 25 mg via ORAL
  Filled 2012-08-22: qty 1

## 2012-08-22 NOTE — Progress Notes (Signed)
*  PRELIMINARY RESULTS* Vascular Ultrasound Right upper extremity venous duplex has been completed.  Preliminary findings: no evidence of deep or superficial thrombosis.   Farrel Demark, RDMS, RVT  08/22/2012, 8:19 AM
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
## 2012-08-22 NOTE — ED Notes (Signed)
Pt resting quietly at the time. Vital signs stable. Given warm blanket. Pt states "I am going to sleep." no signs of distress noted. Pt updated on plan of care.

## 2012-08-22 NOTE — ED Notes (Signed)
Patient transported to Ultrasound 

## 2012-08-22 NOTE — ED Notes (Signed)
IV team paged.  

## 2012-08-22 NOTE — ED Provider Notes (Signed)
Care assumed from Sharen Hones, NP at shift change. Patient awaiting doppler US to r/o DVT. If negative, d/c home.  8:41 AM  Doppler negative. Patient discharged home.  Trevor Mace, PA-C 08/22/12 [phone]

## 2012-08-23 NOTE — ED Provider Notes (Signed)
Medical screening examination/treatment/procedure(s) were performed by non-physician practitioner and as supervising physician I was immediately available for consultation/collaboration.  Zakirah Weingart, MD 08/23/12 0444 

## 2012-08-23 NOTE — ED Provider Notes (Signed)
Medical screening examination/treatment/procedure(s) were performed by non-physician practitioner and as supervising physician I was immediately available for consultation/collaboration.  Bernerd Terhune, MD 08/23/12 0444 

## 2012-08-24 LAB — WOUND CULTURE: Culture: NO GROWTH

## 2012-08-28 ENCOUNTER — Other Ambulatory Visit (HOSPITAL_COMMUNITY)
Admission: RE | Admit: 2012-08-28 | Discharge: 2012-08-28 | Disposition: A | Discharge status: Home / Self Care | Payer: No Typology Code available for payment source | Source: Ambulatory Visit | Attending: Family Medicine | Admitting: Family Medicine

## 2012-08-28 ENCOUNTER — Encounter: Payer: Self-pay | Admitting: Family Medicine

## 2012-08-28 ENCOUNTER — Telehealth: Payer: Self-pay | Admitting: *Deleted

## 2012-08-28 ENCOUNTER — Ambulatory Visit (INDEPENDENT_AMBULATORY_CARE_PROVIDER_SITE_OTHER): Payer: No Typology Code available for payment source | Admitting: Family Medicine

## 2012-08-28 VITALS — BP 118/71 | HR 72 | Temp 98.1°F | Ht 64.0 in | Wt 176.0 lb

## 2012-08-28 DIAGNOSIS — E118 Type 2 diabetes mellitus with unspecified complications: Secondary | ICD-10-CM

## 2012-08-28 DIAGNOSIS — Z113 Encounter for screening for infections with a predominantly sexual mode of transmission: Secondary | ICD-10-CM | POA: Insufficient documentation

## 2012-08-28 DIAGNOSIS — M869 Osteomyelitis, unspecified: Secondary | ICD-10-CM

## 2012-08-28 DIAGNOSIS — B379 Candidiasis, unspecified: Secondary | ICD-10-CM | POA: Insufficient documentation

## 2012-08-28 MED ORDER — GLIMEPIRIDE 4 MG PO TABS: 4.0000 mg | ORAL_TABLET | Freq: Every day | ORAL | Status: DC

## 2012-08-28 MED ORDER — INSULIN ASPART 100 UNIT/ML ~~LOC~~ SOLN: 6.0000 [IU] | Freq: Three times a day (TID) | SUBCUTANEOUS | Status: DC

## 2012-08-28 MED ORDER — INSULIN GLARGINE 100 UNIT/ML ~~LOC~~ SOLN: 18.0000 [IU] | Freq: Every day | SUBCUTANEOUS | Status: DC

## 2012-08-28 MED ORDER — NYSTATIN 100000 UNIT/GM EX CREA: TOPICAL_CREAM | Freq: Two times a day (BID) | CUTANEOUS | Status: DC

## 2012-08-28 MED ORDER — FLUCONAZOLE 150 MG PO TABS: ORAL_TABLET | ORAL | Status: DC

## 2012-08-28 NOTE — Progress Notes (Signed)
Subjective:     Patient ID: Mitchell [name], male   DOB: [birth year], 37 y.o.   MRN: [id number]  HPI Rash: patient has been experiencing a rash for 1 month. He reports  It is red over his penis and thighs. Urinating makes it burn. Sometimes it is worse than others, he has been putting olive oil on it. He has one partner for 14 years, monogamous relationship. He has no dysuria, other than when it hits the irritation. No fever, frequency or abdominal pain. His partner is asymptomatic.   Foot/diabetes: Patient with recent hospitalization for cellulitis, on IV antibiotics for 6 weeks. Following with Dr. Lajoyce Corners in outpatient for foot wound. Patient is unsure when his antibiotics are finished and states his home health nurse also does not know. He also reports Dr. Lajoyce Corners stated he did not know. Patient is wondering when his antibiotics are completed and when will he be able to get his line removed. Patient has been without diabetic medications because he had the incorrect pharmacy listed in his file.   Review of Systems Negative, with the exception of above mentioned in HPI    Objective:   Physical Exam BP 118/71  Pulse 72  Temp(Src) 98.1 F (36.7 C) (Oral)  Ht 5\' 4"  (1.626 m)  Wt 176 lb (79.833 kg)  BMI 30.2 kg/m2 Gen: NAD. Phone  Interpreter used.  CV: RRR  Chest: CTAB, no wheeze or crackles Abd: Soft.NTND. BS +. - Masses palpated.  Ext: No erythema. - edema. Small area of right foot opened and draining (minor). Band-Aid over area.  GU: Moderate Erythema bilateral medial thighs and scrotum. Foreskin difficult to retract without pain. Moderate irritation/erythema to Glans penis. White cheesy discharge around glans penis. No notable discharge from urethra.

## 2012-08-28 NOTE — Assessment & Plan Note (Addendum)
-   likely yeast considering distribution on penis and bilateral inner thighs, with long term antibiotic use and diabetes history.  - UA cytology today for G/C and yeast. - Diflucan and nystatin cream prescribed.  - F/U: 3 weeks

## 2012-08-28 NOTE — Patient Instructions (Signed)
Infección por hongos de la piel  (Yeast Infection of the Skin)  La presencia de algunos hongos en la piel es normal, pero en ocasiones puede causar una infección. Si tiene una infección por hongos, se verán como manchas blancas, o de color marrón claro sobre la piel morena. Se puede notar mejor en el verano con la piel bronceada. Se ven manchas de color claro sobre el bronceado. Pueden aparecer en cualquier parte del cuerpo. No puede transmitirse de persona a persona.  CUIDADOS EN EL HOGAR  · Frote su piel a diario con un champú anticaspa. Es posible que la erupción tarde algunos días en mejorar.  · No se rasque la zona de la erupción.  SOLICITE AYUDA DE INMEDIATO SI:   · Presenta una nueva infección debido al rascado. La piel puede estar caliente, enrojecida, y puede drenar líquido.  · La infección parece empeorar.  ASEGÚRESE DE QUE:  · Comprende estas instrucciones.  · Controlará su enfermedad.  · Solicitará ayuda de inmediato si no mejora o si empeora.  Document Released: 02/13/2010 Document Revised: 04/05/2011  Exit[employer]® Patient Information ©2014 [employer].

## 2012-08-28 NOTE — Assessment & Plan Note (Signed)
-   Patient reports he has been attempting to get his pharmacy to get his DM prescriptions refilled. However, he states they tell him he has nothing there and does not call in his script.  - Per records, the prescriptions have been called in to the pharmacy listed in his record, which is not the one he currently goes to. It has been changed in the system today and new scripts have been called in to pharmacy.  - A1c in 1 month (next appt)

## 2012-08-28 NOTE — Telephone Encounter (Signed)
[facility] calling - pt has blood sugar 390, lethargic, and not eating - not responding as normal - directed home care nurse to call EMS for transfer to ED for further evaluation. Wyatt Haste, RN-BSN

## 2012-08-28 NOTE — Telephone Encounter (Signed)
Ramiro Harvest, RN with [facility] called the physician/pharmacy line at 2:39 pm and left message.  Pt's CBG was 390.  Called Chris back at 4:26 pm and patient took his 5:00 pm insulin early and CBG now 147.  Thayer Ohm also drew labs today and she will be faxing the results to Dr. Claiborne Billings.  Also, patient does not have a sliding scale.  Will fwd to MD.  Radene Ou, CMA

## 2012-08-28 NOTE — Assessment & Plan Note (Addendum)
-   Current infection is improving. Patient is on IV abx, appears Dr. Lajoyce Corners is managing, but patient is unsure if her is managing.  - Discharge summary reflects outpatient abx for 6 weeks, with [facility] and [facility].  - will follow this up and get back to patient.  - F/U: 2-3 weeks

## 2012-08-29 ENCOUNTER — Other Ambulatory Visit: Payer: Self-pay | Admitting: *Deleted

## 2012-08-29 MED ORDER — METFORMIN HCL 1000 MG PO TABS: 1000.0000 mg | ORAL_TABLET | Freq: Two times a day (BID) | ORAL | Status: DC

## 2012-09-04 ENCOUNTER — Other Ambulatory Visit: Payer: Self-pay | Admitting: *Deleted

## 2012-09-07 ENCOUNTER — Emergency Department (HOSPITAL_COMMUNITY): Payer: No Typology Code available for payment source

## 2012-09-07 ENCOUNTER — Emergency Department (HOSPITAL_COMMUNITY)
Admission: EM | Admit: 2012-09-07 | Discharge: 2012-09-07 | Disposition: A | Discharge status: Home / Self Care | Payer: No Typology Code available for payment source | Attending: Emergency Medicine | Admitting: Emergency Medicine

## 2012-09-07 ENCOUNTER — Encounter (HOSPITAL_COMMUNITY): Payer: Self-pay

## 2012-09-07 DIAGNOSIS — Z792 Long term (current) use of antibiotics: Secondary | ICD-10-CM | POA: Insufficient documentation

## 2012-09-07 DIAGNOSIS — R079 Chest pain, unspecified: Secondary | ICD-10-CM

## 2012-09-07 DIAGNOSIS — R0789 Other chest pain: Secondary | ICD-10-CM | POA: Insufficient documentation

## 2012-09-07 DIAGNOSIS — F1021 Alcohol dependence, in remission: Secondary | ICD-10-CM | POA: Insufficient documentation

## 2012-09-07 DIAGNOSIS — Z8659 Personal history of other mental and behavioral disorders: Secondary | ICD-10-CM | POA: Insufficient documentation

## 2012-09-07 DIAGNOSIS — Z4689 Encounter for fitting and adjustment of other specified devices: Secondary | ICD-10-CM | POA: Insufficient documentation

## 2012-09-07 DIAGNOSIS — F329 Major depressive disorder, single episode, unspecified: Secondary | ICD-10-CM | POA: Insufficient documentation

## 2012-09-07 DIAGNOSIS — F3289 Other specified depressive episodes: Secondary | ICD-10-CM | POA: Insufficient documentation

## 2012-09-07 DIAGNOSIS — Z8719 Personal history of other diseases of the digestive system: Secondary | ICD-10-CM | POA: Insufficient documentation

## 2012-09-07 DIAGNOSIS — K219 Gastro-esophageal reflux disease without esophagitis: Secondary | ICD-10-CM | POA: Insufficient documentation

## 2012-09-07 DIAGNOSIS — Z8739 Personal history of other diseases of the musculoskeletal system and connective tissue: Secondary | ICD-10-CM | POA: Insufficient documentation

## 2012-09-07 DIAGNOSIS — Z87828 Personal history of other (healed) physical injury and trauma: Secondary | ICD-10-CM | POA: Insufficient documentation

## 2012-09-07 DIAGNOSIS — Z8679 Personal history of other diseases of the circulatory system: Secondary | ICD-10-CM | POA: Insufficient documentation

## 2012-09-07 DIAGNOSIS — M869 Osteomyelitis, unspecified: Secondary | ICD-10-CM | POA: Insufficient documentation

## 2012-09-07 DIAGNOSIS — Z794 Long term (current) use of insulin: Secondary | ICD-10-CM | POA: Insufficient documentation

## 2012-09-07 DIAGNOSIS — E119 Type 2 diabetes mellitus without complications: Secondary | ICD-10-CM | POA: Insufficient documentation

## 2012-09-07 DIAGNOSIS — Z79899 Other long term (current) drug therapy: Secondary | ICD-10-CM | POA: Insufficient documentation

## 2012-09-07 LAB — CBC
Hemoglobin: 14.1 g/dL (ref 13.0–17.0)
MCH: 31.1 pg (ref 26.0–34.0)
RBC: 4.53 MIL/uL (ref 4.22–5.81)

## 2012-09-07 LAB — BASIC METABOLIC PANEL
CO2: 21 mEq/L (ref 19–32)
Glucose, Bld: 153 mg/dL — ABNORMAL HIGH (ref 70–99)
Potassium: 3.2 mEq/L — ABNORMAL LOW (ref 3.5–5.1)
Sodium: 136 mEq/L (ref 135–145)
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
## 2012-09-07 NOTE — ED Notes (Addendum)
Pt. oob to the bathroom, gait steady.  Pt. Denies any chest pain at present time
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
## 2012-09-12 NOTE — ED Provider Notes (Signed)
Medical screening examination/treatment/procedure(s) were performed by non-physician practitioner and as supervising physician I was immediately available for consultation/collaboration.   Sunnie Nielsen, MD 09/12/12 [phone]

## 2012-09-20 ENCOUNTER — Ambulatory Visit: Payer: Self-pay | Admitting: Family Medicine

## 2012-10-03 ENCOUNTER — Encounter: Payer: Self-pay | Admitting: Family Medicine

## 2012-10-03 ENCOUNTER — Telehealth: Payer: Self-pay | Admitting: *Deleted

## 2012-10-03 NOTE — Telephone Encounter (Signed)
Patient's sister called and left message on Hispanic line.  Call interpreted by [employer].  Patient is incarcerated in [facility] in [place].  Patient's sister is requesting letter that patient needs to be able to take his diabetic and depression meds.  Sister c/o foot drainage--had hx of toe amputation.  Sister would like to pick up this letter and take it to the jail.  Will route request to Dr. Claiborne Billings and call sister back.  Gaylene Brooks, RN

## 2013-02-23 ENCOUNTER — Inpatient Hospital Stay (HOSPITAL_COMMUNITY)
Admission: EM | Admit: 2013-02-23 | Discharge: 2013-02-25 | DRG: 638 | Attending: Family Medicine | Admitting: Family Medicine

## 2013-02-23 ENCOUNTER — Encounter (HOSPITAL_COMMUNITY): Payer: Self-pay | Admitting: Emergency Medicine

## 2013-02-23 ENCOUNTER — Emergency Department (HOSPITAL_COMMUNITY)

## 2013-02-23 ENCOUNTER — Inpatient Hospital Stay (HOSPITAL_COMMUNITY)

## 2013-02-23 DIAGNOSIS — F1020 Alcohol dependence, uncomplicated: Secondary | ICD-10-CM | POA: Diagnosis present

## 2013-02-23 DIAGNOSIS — Z23 Encounter for immunization: Secondary | ICD-10-CM

## 2013-02-23 DIAGNOSIS — F321 Major depressive disorder, single episode, moderate: Secondary | ICD-10-CM | POA: Diagnosis present

## 2013-02-23 DIAGNOSIS — Z794 Long term (current) use of insulin: Secondary | ICD-10-CM

## 2013-02-23 DIAGNOSIS — L03119 Cellulitis of unspecified part of limb: Secondary | ICD-10-CM

## 2013-02-23 DIAGNOSIS — E118 Type 2 diabetes mellitus with unspecified complications: Secondary | ICD-10-CM

## 2013-02-23 DIAGNOSIS — M79671 Pain in right foot: Secondary | ICD-10-CM | POA: Diagnosis present

## 2013-02-23 DIAGNOSIS — K7030 Alcoholic cirrhosis of liver without ascites: Secondary | ICD-10-CM | POA: Diagnosis present

## 2013-02-23 DIAGNOSIS — L02419 Cutaneous abscess of limb, unspecified: Secondary | ICD-10-CM

## 2013-02-23 DIAGNOSIS — F329 Major depressive disorder, single episode, unspecified: Secondary | ICD-10-CM

## 2013-02-23 DIAGNOSIS — L97509 Non-pressure chronic ulcer of other part of unspecified foot with unspecified severity: Secondary | ICD-10-CM | POA: Diagnosis present

## 2013-02-23 DIAGNOSIS — D696 Thrombocytopenia, unspecified: Secondary | ICD-10-CM | POA: Diagnosis present

## 2013-02-23 DIAGNOSIS — D649 Anemia, unspecified: Secondary | ICD-10-CM

## 2013-02-23 DIAGNOSIS — S98119A Complete traumatic amputation of unspecified great toe, initial encounter: Secondary | ICD-10-CM

## 2013-02-23 DIAGNOSIS — E1169 Type 2 diabetes mellitus with other specified complication: Principal | ICD-10-CM | POA: Diagnosis present

## 2013-02-23 DIAGNOSIS — L089 Local infection of the skin and subcutaneous tissue, unspecified: Secondary | ICD-10-CM

## 2013-02-23 DIAGNOSIS — Z833 Family history of diabetes mellitus: Secondary | ICD-10-CM

## 2013-02-23 DIAGNOSIS — R0789 Other chest pain: Secondary | ICD-10-CM

## 2013-02-23 DIAGNOSIS — E11628 Type 2 diabetes mellitus with other skin complications: Secondary | ICD-10-CM

## 2013-02-23 DIAGNOSIS — F32A Depression, unspecified: Secondary | ICD-10-CM | POA: Diagnosis present

## 2013-02-23 DIAGNOSIS — K219 Gastro-esophageal reflux disease without esophagitis: Secondary | ICD-10-CM | POA: Diagnosis present

## 2013-02-23 LAB — CBC WITH DIFFERENTIAL/PLATELET
BASOS ABS: 0.1 10*3/uL (ref 0.0–0.1)
BASOS PCT: 1 % (ref 0–1)
EOS ABS: 0.2 10*3/uL (ref 0.0–0.7)
Eosinophils Relative: 4 % (ref 0–5)
HCT: 40.6 % (ref 39.0–52.0)
Hemoglobin: 15.2 g/dL (ref 13.0–17.0)
Lymphocytes Relative: 29 % (ref 12–46)
Lymphs Abs: 1.5 10*3/uL (ref 0.7–4.0)
MCH: 32.2 pg (ref 26.0–34.0)
MCHC: 37.4 g/dL — AB (ref 30.0–36.0)
MCV: 86 fL (ref 78.0–100.0)
MONOS PCT: 9 % (ref 3–12)
Monocytes Absolute: 0.5 10*3/uL (ref 0.1–1.0)
NEUTROS ABS: 2.9 10*3/uL (ref 1.7–7.7)
NEUTROS PCT: 58 % (ref 43–77)
PLATELETS: 94 10*3/uL — AB (ref 150–400)
RBC: 4.72 MIL/uL (ref 4.22–5.81)
RDW: 12.8 % (ref 11.5–15.5)
WBC: 5.1 10*3/uL (ref 4.0–10.5)

## 2013-02-23 LAB — BASIC METABOLIC PANEL
BUN: 8 mg/dL (ref 6–23)
CHLORIDE: 98 meq/L (ref 96–112)
CO2: 21 mEq/L (ref 19–32)
Calcium: 8.6 mg/dL (ref 8.4–10.5)
Creatinine, Ser: 0.75 mg/dL (ref 0.50–1.35)
Glucose, Bld: 215 mg/dL — ABNORMAL HIGH (ref 70–99)
POTASSIUM: 3.8 meq/L (ref 3.7–5.3)
SODIUM: 134 meq/L — AB (ref 137–147)

## 2013-02-23 LAB — URINALYSIS, ROUTINE W REFLEX MICROSCOPIC
BILIRUBIN URINE: NEGATIVE
Glucose, UA: 100 mg/dL — AB
Ketones, ur: NEGATIVE mg/dL
LEUKOCYTES UA: NEGATIVE
NITRITE: NEGATIVE
Protein, ur: NEGATIVE mg/dL
SPECIFIC GRAVITY, URINE: 1.005 (ref 1.005–1.030)
UROBILINOGEN UA: 0.2 mg/dL (ref 0.0–1.0)
pH: 7.5 (ref 5.0–8.0)

## 2013-02-23 LAB — HEPATIC FUNCTION PANEL
ALT: 27 U/L (ref 0–53)
AST: 34 U/L (ref 0–37)
Albumin: 3.5 g/dL (ref 3.5–5.2)
Alkaline Phosphatase: 96 U/L (ref 39–117)
BILIRUBIN TOTAL: 0.7 mg/dL (ref 0.3–1.2)
Bilirubin, Direct: <0.2 mg/dL (ref 0.0–0.3)
TOTAL PROTEIN: 7.8 g/dL (ref 6.0–8.3)

## 2013-02-23 LAB — SURGICAL PCR SCREEN
MRSA, PCR: NEGATIVE
Staphylococcus aureus: NEGATIVE

## 2013-02-23 LAB — GLUCOSE, CAPILLARY
Glucose-Capillary: 213 mg/dL — ABNORMAL HIGH (ref 70–99)
Glucose-Capillary: 241 mg/dL — ABNORMAL HIGH (ref 70–99)
Glucose-Capillary: 244 mg/dL — ABNORMAL HIGH (ref 70–99)

## 2013-02-23 LAB — URINE MICROSCOPIC-ADD ON

## 2013-02-23 LAB — TROPONIN I: Troponin I: <0.3 ng/mL (ref <0.30)

## 2013-02-23 LAB — C-REACTIVE PROTEIN: CRP: <0.5 mg/dL — ABNORMAL LOW (ref <0.60)

## 2013-02-23 LAB — SEDIMENTATION RATE: SED RATE: 15 mm/h (ref 0–16)

## 2013-02-23 MED ORDER — RISPERIDONE 1 MG PO TABS
1.0000 mg | ORAL_TABLET | Freq: Every day | ORAL | Status: DC
  Administered 2013-02-23 – 2013-02-24 (×2): 1 mg via ORAL
  Filled 2013-02-23 (×3): qty 1

## 2013-02-23 MED ORDER — ONDANSETRON HCL 4 MG/2ML IJ SOLN
4.0000 mg | Freq: Once | INTRAMUSCULAR | Status: AC
  Administered 2013-02-23: 4 mg via INTRAVENOUS
  Filled 2013-02-23: qty 2

## 2013-02-23 MED ORDER — VANCOMYCIN HCL IN DEXTROSE 1-5 GM/200ML-% IV SOLN
1000.0000 mg | INTRAVENOUS | Status: AC
  Administered 2013-02-23: 1000 mg via INTRAVENOUS
  Filled 2013-02-23 (×2): qty 200

## 2013-02-23 MED ORDER — PNEUMOCOCCAL VAC POLYVALENT 25 MCG/0.5ML IJ INJ
0.5000 mL | INJECTION | INTRAMUSCULAR | Status: AC
  Administered 2013-02-24: 0.5 mL via INTRAMUSCULAR
  Filled 2013-02-23: qty 0.5

## 2013-02-23 MED ORDER — PIPERACILLIN-TAZOBACTAM 3.375 G IVPB
3.3750 g | Freq: Three times a day (TID) | INTRAVENOUS | Status: DC
  Administered 2013-02-23 – 2013-02-25 (×6): 3.375 g via INTRAVENOUS
  Filled 2013-02-23 (×9): qty 50

## 2013-02-23 MED ORDER — SODIUM CHLORIDE 0.9 % IV SOLN: 250.0000 mL | INTRAVENOUS | Status: DC | PRN

## 2013-02-23 MED ORDER — HEPARIN SODIUM (PORCINE) 5000 UNIT/ML IJ SOLN
5000.0000 [IU] | Freq: Three times a day (TID) | INTRAMUSCULAR | Status: DC
  Administered 2013-02-23 – 2013-02-25 (×7): 5000 [IU] via SUBCUTANEOUS
  Filled 2013-02-23 (×9): qty 1

## 2013-02-23 MED ORDER — TRAMADOL HCL 50 MG PO TABS
50.0000 mg | ORAL_TABLET | Freq: Four times a day (QID) | ORAL | Status: DC
  Administered 2013-02-23 – 2013-02-25 (×8): 50 mg via ORAL
  Filled 2013-02-23 (×8): qty 1

## 2013-02-23 MED ORDER — PIPERACILLIN-TAZOBACTAM 3.375 G IVPB 30 MIN
3.3750 g | INTRAVENOUS | Status: AC
  Administered 2013-02-23: 3.375 g via INTRAVENOUS
  Filled 2013-02-23 (×2): qty 50

## 2013-02-23 MED ORDER — MORPHINE SULFATE 4 MG/ML IJ SOLN
4.0000 mg | INTRAMUSCULAR | Status: DC | PRN
  Administered 2013-02-23: 4 mg via INTRAVENOUS
  Filled 2013-02-23: qty 1

## 2013-02-23 MED ORDER — GI COCKTAIL ~~LOC~~
30.0000 mL | Freq: Once | ORAL | Status: AC
  Administered 2013-02-23: 30 mL via ORAL
  Filled 2013-02-23: qty 30

## 2013-02-23 MED ORDER — INSULIN ASPART 100 UNIT/ML ~~LOC~~ SOLN
0.0000 [IU] | Freq: Three times a day (TID) | SUBCUTANEOUS | Status: DC
  Administered 2013-02-23: 5 [IU] via SUBCUTANEOUS
  Administered 2013-02-24: 3 [IU] via SUBCUTANEOUS
  Administered 2013-02-24: 8 [IU] via SUBCUTANEOUS
  Administered 2013-02-24: 5 [IU] via SUBCUTANEOUS
  Administered 2013-02-25 (×2): 3 [IU] via SUBCUTANEOUS

## 2013-02-23 MED ORDER — CITALOPRAM HYDROBROMIDE 40 MG PO TABS
40.0000 mg | ORAL_TABLET | Freq: Every day | ORAL | Status: DC
  Administered 2013-02-24 – 2013-02-25 (×2): 40 mg via ORAL
  Filled 2013-02-23 (×2): qty 1

## 2013-02-23 MED ORDER — INSULIN ASPART 100 UNIT/ML ~~LOC~~ SOLN
0.0000 [IU] | Freq: Every day | SUBCUTANEOUS | Status: DC
  Administered 2013-02-23: 2 [IU] via SUBCUTANEOUS

## 2013-02-23 MED ORDER — SODIUM CHLORIDE 0.9 % IJ SOLN: 3.0000 mL | INTRAMUSCULAR | Status: DC | PRN

## 2013-02-23 MED ORDER — SODIUM CHLORIDE 0.9 % IJ SOLN
3.0000 mL | Freq: Two times a day (BID) | INTRAMUSCULAR | Status: DC
  Administered 2013-02-23 – 2013-02-25 (×2): 3 mL via INTRAVENOUS

## 2013-02-23 MED ORDER — PANTOPRAZOLE SODIUM 40 MG IV SOLR
40.0000 mg | INTRAVENOUS | Status: DC
  Administered 2013-02-23: 40 mg via INTRAVENOUS
  Filled 2013-02-23 (×2): qty 40

## 2013-02-23 MED ORDER — INSULIN GLARGINE 100 UNIT/ML ~~LOC~~ SOLN
18.0000 [IU] | Freq: Every day | SUBCUTANEOUS | Status: DC
  Administered 2013-02-23 – 2013-02-24 (×2): 18 [IU] via SUBCUTANEOUS
  Filled 2013-02-23 (×3): qty 0.18

## 2013-02-23 MED ORDER — TRAZODONE HCL 50 MG PO TABS
50.0000 mg | ORAL_TABLET | Freq: Every evening | ORAL | Status: DC | PRN
  Administered 2013-02-23 – 2013-02-24 (×2): 50 mg via ORAL
  Filled 2013-02-23 (×6): qty 1

## 2013-02-23 MED ORDER — PIPERACILLIN-TAZOBACTAM 3.375 G IVPB 30 MIN
3.3750 g | INTRAVENOUS | Status: DC
  Filled 2013-02-23: qty 50

## 2013-02-23 MED ORDER — VANCOMYCIN HCL IN DEXTROSE 1-5 GM/200ML-% IV SOLN
1000.0000 mg | Freq: Three times a day (TID) | INTRAVENOUS | Status: DC
  Administered 2013-02-23 – 2013-02-25 (×6): 1000 mg via INTRAVENOUS
  Filled 2013-02-23 (×9): qty 200
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
## 2013-02-23 NOTE — ED Notes (Signed)
Applied no stick pad and new gauze wrap to foot, [facility] here to pick pt up

## 2013-02-23 NOTE — ED Notes (Signed)
Pt had some redness by IV when antibiotics completed.  This began a few minutes prior to completion of abt.  IV flushes well and can pull back blood

## 2013-02-23 NOTE — ED Notes (Signed)
Pt denies any fevers or chills

## 2013-02-23 NOTE — ED Notes (Signed)
Report given to adrianne on 5N at [facility]

## 2013-02-23 NOTE — ED Notes (Signed)
Gave pt lunch per pa

## 2013-02-23 NOTE — H&P (Signed)
[facility]MT[facility] Attending Admission Note: Mitchell Abair MD [phone] pager office [phone] I  have seen and examined this patient, reviewed their chart. I have discussed this patient with the resident. I agree with the resident's findings, assessment and care plan. 
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
## 2013-02-23 NOTE — ED Provider Notes (Signed)
  Face-to-face evaluation   History: Right foot pain, swelling, and infection for several weeks, worsening, despite treatment. Remote surgical amputation, right great toe, cause not known. No systemic symptoms. He does have pain in his right knee and right upper thigh  Physical exam: Alert, calm, cooperative. Right foot is diffusely swollen. It has multiple areas of drainage, purulent material. It is exquisitely tender to palpation. There is a contracture in the right flank and forefoot, with deformity and skin discoloration, but no clear eschar or necrosis.  Medical screening examination/treatment/procedure(s) were conducted as a shared visit with non-physician practitioner(s) and myself.  I personally evaluated the patient during the encounter  Flint MelterElliott L Yeng Perz, MD 02/26/13 2041
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
## 2013-02-23 NOTE — ED Notes (Addendum)
Pt from jail with [employer], pt c/o R foot ulcer that appears infected. Pt is diabetic, had R toe amputation approx 1 year ago. Pt adds that he also has body aches. Pt is A&O and in NAD. Pt speaks spanish, deputy is translating
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
## 2013-02-24 ENCOUNTER — Inpatient Hospital Stay (HOSPITAL_COMMUNITY)

## 2013-02-24 LAB — BASIC METABOLIC PANEL
BUN: 13 mg/dL (ref 6–23)
CO2: 25 meq/L (ref 19–32)
CREATININE: 1.05 mg/dL (ref 0.50–1.35)
Calcium: 8.4 mg/dL (ref 8.4–10.5)
Chloride: 101 mEq/L (ref 96–112)
GFR calc Af Amer: >90 mL/min (ref >90)
GFR, EST NON AFRICAN AMERICAN: 88 mL/min — AB (ref >90)
Glucose, Bld: 223 mg/dL — ABNORMAL HIGH (ref 70–99)
Potassium: 4 mEq/L (ref 3.7–5.3)
Sodium: 137 mEq/L (ref 137–147)

## 2013-02-24 LAB — GLUCOSE, CAPILLARY
GLUCOSE-CAPILLARY: 197 mg/dL — AB (ref 70–99)
GLUCOSE-CAPILLARY: 269 mg/dL — AB (ref 70–99)
Glucose-Capillary: 250 mg/dL — ABNORMAL HIGH (ref 70–99)
Glucose-Capillary: 179 mg/dL — ABNORMAL HIGH (ref 70–99)

## 2013-02-24 LAB — CBC
HCT: 39.1 % (ref 39.0–52.0)
Hemoglobin: 14.4 g/dL (ref 13.0–17.0)
MCH: 32 pg (ref 26.0–34.0)
MCHC: 36.8 g/dL — ABNORMAL HIGH (ref 30.0–36.0)
MCV: 86.9 fL (ref 78.0–100.0)
Platelets: 84 K/uL — ABNORMAL LOW (ref 150–400)
RBC: 4.5 MIL/uL (ref 4.22–5.81)
RDW: 13.1 % (ref 11.5–15.5)
WBC: 4.3 K/uL (ref 4.0–10.5)

## 2013-02-24 LAB — LIPID PANEL
CHOL/HDL RATIO: 5.4 ratio
Cholesterol: 91 mg/dL (ref 0–200)
HDL: 17 mg/dL — ABNORMAL LOW (ref >39)
LDL CALC: 25 mg/dL (ref 0–99)
Triglycerides: 245 mg/dL — ABNORMAL HIGH (ref <150)
VLDL: 49 mg/dL — AB (ref 0–40)

## 2013-02-24 LAB — HEMOGLOBIN A1C
Hgb A1c MFr Bld: 7.8 % — ABNORMAL HIGH (ref <5.7)
Mean Plasma Glucose: 177 mg/dL — ABNORMAL HIGH (ref <117)

## 2013-02-24 LAB — TROPONIN I
Troponin I: <0.3 ng/mL
Troponin I: <0.3 ng/mL

## 2013-02-24 LAB — TSH: TSH: 1.791 u[IU]/mL (ref 0.350–4.500)

## 2013-02-24 MED ORDER — PANTOPRAZOLE SODIUM 40 MG PO TBEC
40.0000 mg | DELAYED_RELEASE_TABLET | Freq: Every day | ORAL | Status: DC
  Administered 2013-02-24: 40 mg via ORAL
  Filled 2013-02-24: qty 1

## 2013-02-24 MED ORDER — GADOBENATE DIMEGLUMINE 529 MG/ML IV SOLN
15.0000 mL | Freq: Once | INTRAVENOUS | Status: AC
  Administered 2013-02-24: 15 mL via INTRAVENOUS

## 2013-02-24 NOTE — Progress Notes (Signed)
Awaiting MRI results to determine course of action.

## 2013-02-24 NOTE — Progress Notes (Signed)
FMTS Attending Daily Note: Denny LevySara Javae Braaten MD [phone] pager office [phone] I  have seen and examined this patient, reviewed their chart. I have discussed this patient with the resident. I agree with the resident's findings, assessment and care plan. Still await his MRI. This certainly is most likely to be osteomyelitis and I suspect she will need further surgery. He is otherwise stable.
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
## 2013-02-25 DIAGNOSIS — F329 Major depressive disorder, single episode, unspecified: Secondary | ICD-10-CM

## 2013-02-25 DIAGNOSIS — F3289 Other specified depressive episodes: Secondary | ICD-10-CM

## 2013-02-25 DIAGNOSIS — R0789 Other chest pain: Secondary | ICD-10-CM

## 2013-02-25 LAB — BASIC METABOLIC PANEL
BUN: 10 mg/dL (ref 6–23)
CHLORIDE: 99 meq/L (ref 96–112)
CO2: 24 mEq/L (ref 19–32)
Calcium: 8.5 mg/dL (ref 8.4–10.5)
Creatinine, Ser: 0.92 mg/dL (ref 0.50–1.35)
GFR calc Af Amer: >90 mL/min (ref >90)
GFR calc non Af Amer: >90 mL/min (ref >90)
GLUCOSE: 242 mg/dL — AB (ref 70–99)
Potassium: 3.9 mEq/L (ref 3.7–5.3)
Sodium: 134 mEq/L — ABNORMAL LOW (ref 137–147)

## 2013-02-25 LAB — CBC
HCT: 38.4 % — ABNORMAL LOW (ref 39.0–52.0)
Hemoglobin: 14.4 g/dL (ref 13.0–17.0)
MCH: 32.8 pg (ref 26.0–34.0)
MCHC: 37.5 g/dL — ABNORMAL HIGH (ref 30.0–36.0)
MCV: 87.5 fL (ref 78.0–100.0)
Platelets: 75 10*3/uL — ABNORMAL LOW (ref 150–400)
RBC: 4.39 MIL/uL (ref 4.22–5.81)
RDW: 13.1 % (ref 11.5–15.5)
WBC: 3.9 10*3/uL — ABNORMAL LOW (ref 4.0–10.5)

## 2013-02-25 LAB — GLUCOSE, CAPILLARY
Glucose-Capillary: 193 mg/dL — ABNORMAL HIGH (ref 70–99)
Glucose-Capillary: 183 mg/dL — ABNORMAL HIGH (ref 70–99)

## 2013-02-25 LAB — VANCOMYCIN, TROUGH: Vancomycin Tr: 12.5 ug/mL (ref 10.0–20.0)

## 2013-02-25 MED ORDER — DOXYCYCLINE HYCLATE 100 MG PO TABS: 100.0000 mg | ORAL_TABLET | Freq: Two times a day (BID) | ORAL | Status: DC

## 2013-02-25 MED ORDER — PROSIGHT PO TABS
1.0000 | ORAL_TABLET | Freq: Every day | ORAL | Status: DC
  Administered 2013-02-25: 1 via ORAL
  Filled 2013-02-25: qty 1

## 2013-02-25 MED ORDER — DIPHENHYDRAMINE HCL 25 MG PO CAPS: 25.0000 mg | ORAL_CAPSULE | Freq: Three times a day (TID) | ORAL | Status: DC | PRN

## 2013-02-25 MED ORDER — VITAMIN C 500 MG PO TABS
500.0000 mg | ORAL_TABLET | Freq: Two times a day (BID) | ORAL | Status: DC
Start: 2013-02-25 — End: 2013-02-25
  Administered 2013-02-25: 500 mg via ORAL
  Filled 2013-02-25 (×2): qty 1

## 2013-02-25 MED ORDER — CLINDAMYCIN HCL 300 MG PO CAPS: 600.0000 mg | ORAL_CAPSULE | Freq: Three times a day (TID) | ORAL | Status: DC

## 2013-02-25 MED ORDER — INSULIN GLARGINE 100 UNIT/ML ~~LOC~~ SOLN: 20.0000 [IU] | Freq: Every day | SUBCUTANEOUS | Status: DC

## 2013-02-25 MED ORDER — TRAMADOL HCL 50 MG PO TABS: 50.0000 mg | ORAL_TABLET | Freq: Four times a day (QID) | ORAL | Status: DC | PRN

## 2013-02-25 MED ORDER — ASCORBIC ACID 500 MG PO TABS: 500.0000 mg | ORAL_TABLET | Freq: Two times a day (BID) | ORAL | Status: DC

## 2013-02-25 MED ORDER — INSULIN GLARGINE 100 UNIT/ML ~~LOC~~ SOLN
20.0000 [IU] | Freq: Every day | SUBCUTANEOUS | Status: DC
  Filled 2013-02-25: qty 0.2

## 2013-02-25 NOTE — Progress Notes (Signed)
FMTS Attending Daily Note: Mitchell LevySara Daphyne Miguez MD [phone] pager office [phone] I have discussed this patient with the resident and reviewed the assessment and plan as documented above. I agree wit the resident's findings and plan. MRI is negative for osteomyelitis. Spoke with orthopedics and will discharge home on oral antibiotic therapy and followup with orthopedics.

## 2013-02-25 NOTE — Progress Notes (Addendum)
MRI returned negative for Osteomyelitis.  Will continue IV abx - Vanc & Zosyn.   Will discuss transition to PO abx with Ortho.

## 2013-02-25 NOTE — Progress Notes (Signed)
Will await official report from radiologist. If there is a surgical indication, will plan for surgery tomorrow.
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
## 2013-02-25 NOTE — Discharge Summary (Signed)
Family Medicine Teaching [facility] Discharge Summary  Patient name: Mitchell [name] Medical record number: [id number] Date of birth: [birth year] Age: 38 y.o. Gender: male Date of Admission: 02/23/2013  Date of Discharge: 02/25/2013 Admitting Physician: Nestor Ramp, MD  Primary Care Provider: Felix Pacini, DO Consultants: Orthopedics  Indication for Hospitalization: R foot wound/drainage, Concern for Osteomyelitis  Discharge Diagnoses/Problem List:  Active Problems:   GERD (gastroesophageal reflux disease)   Diabetes mellitus type 2 with complications   Depression   Right foot infection   Chest pain, atypical  Disposition: Stable for discharge back to facility (patient is incarcerated).  Discharge Condition: Stable.   Brief Hospital Course:  38 year old male who presented with drainage and pain of the right foot suspicious for osteomyelitis.  Patient with PMH of prior osteomyelitis of the great toe (right food), DM-2, GERD, depression/mood disorder.    1) Right foot infection - Patient on Clinda and Bactrim prior to admission (had been taking for 8 days per initial history) - Presented with worsening drainage and soreness. Denied systemic symptoms (fever, chills, sweats). - Patient was placed on empiric Vanc/Zosyn. - Xray revealed small amount of air in the soft tissue suspicious for Osteomyelitis.   - Patient was seen by orthopedics and wound was dressed (Unna boot and dynaflex wrap). - MRI was obtained and returned on 02/25/13 - No osteo or abscess. - I discussed this with Orthopedics (Dr. Roda Shutters).  Patient to be discharged on Clindamycin and Doxycycline and followed closely in the Ortho clinic.  2) GERD - Patient placed on PPI during admission. - Patient reported history of prior "infection" suspicious for H. Pylori. - Serologies obtained and were pending at time of discharge.  3) Chest pain - Patient reported 1 episode of left sided chest pain on admission. - EKG - non-ischemic.   Troponin's negative x 3. - No additional chest pain during admission. - Recommend aggressive medical treatment of co-morbitidies  4) DM-2 - A1C - 7.8. - Patient placed on Lantus and Novolog during admission. - Lantus increased to 20 unit QHS prior to discharge. - This will need to be followed closely.  5) Depression/Mood disorder - Continued celexa and risperdal.  Issues for Follow Up:  1) Follow up RLE wound. Patient needs close follow up with Ortho.  2) Follow up Diabetes control. Needs additional titration of insulin regimen. 3) H pylori serologies.  Significant Procedures: None  Significant Labs and Imaging:   Recent Labs Lab 02/23/13 1215 02/24/13 0505 02/25/13 1020  WBC 5.1 4.3 3.9*  HGB 15.2 14.4 14.4  HCT 40.6 39.1 38.4*  PLT 94* 84* 75*    Recent Labs Lab 02/23/13 1215 02/23/13 1901 02/24/13 0505 02/25/13 1020  NA 134*  --  137 134*  K 3.8  --  4.0 3.9  CL 98  --  101 99  CO2 21  --  25 24  GLUCOSE 215*  --  223* 242*  BUN 8  --  13 10  CREATININE 0.75  --  1.05 0.92  CALCIUM 8.6  --  8.4 8.5  ALKPHOS  --  96  --   --   AST  --  34  --   --   ALT  --  27  --   --   ALBUMIN  --  3.5  --   --    Mr Foot Right W Wo Contrast 02/25/2013  IMPRESSION: 1. No abscess or osteomyelitis identified. There is considerable edema tracking in the dorsum  and medial foot with some associated enhancement suggesting cellulitis or active inflammation. 2. Hyperextension of the metatarsophalangeal joints ; flexor digitorum scarring and possible disruption. 3. Midfoot fusion with effusions between the second, third, and fourth metatarsals proximally and distally. First right amputation. 4. Suspected scarring along the central foot distally, below the third metatarsal, causing skin retraction.  Dg Foot Complete Right 02/23/2013   IMPRESSION: No evidence of fracture or obvious osteomyelitis by x-ray. Small amount of subcutaneous air is suspected in the plantar aspect of the  forefoot.   Results/Tests Pending at Time of Discharge: H. Pylori serologies  Discharge Medications:    Medication List    STOP taking these medications       glimepiride 4 MG tablet  Commonly known as:  AMARYL     sulfamethoxazole-trimethoprim 800-160 MG per tablet  Commonly known as:  BACTRIM DS      TAKE these medications       ascorbic acid 500 MG tablet  Commonly known as:  VITAMIN C  Take 1 tablet (500 mg total) by mouth 2 (two) times daily.     citalopram 40 MG tablet  Commonly known as:  CELEXA  Take 1 tablet (40 mg total) by mouth daily. For depression     clindamycin 300 MG capsule  Commonly known as:  CLEOCIN  Take 2 capsules (600 mg total) by mouth 3 (three) times daily. For 14 days.     doxycycline 100 MG tablet  Commonly known as:  VIBRA-TABS  Take 1 tablet (100 mg total) by mouth 2 (two) times daily. For 14 days.     insulin aspart 100 UNIT/ML injection  Commonly known as:  novoLOG  Inject 6 Units into the skin 3 (three) times daily with meals. For high blood sugar control     insulin glargine 100 UNIT/ML injection  Commonly known as:  LANTUS  Inject 0.2 mLs (20 Units total) into the skin at bedtime. For high blood sugar control     metFORMIN 1000 MG tablet  Commonly known as:  GLUCOPHAGE  Take 1 tablet (1,000 mg total) by mouth 2 (two) times daily with a meal. For control of high blood sugar (Diabetes)     multivitamin with minerals Tabs tablet  Take 1 tablet by mouth daily. For for low vitamin     omeprazole 20 MG capsule  Commonly known as:  PRILOSEC  Take 1 capsule (20 mg total) by mouth daily. For acid reflux     risperiDONE 1 MG tablet  Commonly known as:  RISPERDAL  Take 1 tablet (1 mg total) by mouth at bedtime. For mood control     traMADol 50 MG tablet  Commonly known as:  ULTRAM  Take 1 tablet (50 mg total) by mouth every 6 (six) hours as needed.     traZODone 50 MG tablet  Commonly known as:  DESYREL  Take 1 tablet (50 mg  total) by mouth at bedtime and may repeat dose one time if needed. For sleep       Discharge Instructions:  Patient was counseled important signs and symptoms that should prompt return to medical care, changes in medications, dietary instructions, activity restrictions, and follow up appointments.   Follow-Up Appointments: Follow-up Information   Schedule an appointment as soon as possible for a visit with [name], [name], DO.   Specialty:  Family Medicine   Contact information:   [address] [place] [address] [phone]       Schedule an  appointment as soon as possible for a visit with Xu, Naiping Michael, MD.   Specialty:  Orthopedic Surgery   Contact [employer]:   [address] [place] [id number] [phone]       Tommie SamsJayce G Naomi Castrogiovanni, DO 02/25/2013, 2:57 PM PGY-2, [facility]

## 2013-02-25 NOTE — Discharge Instructions (Signed)
You were admitted for your right foot infection.  MRI was obtained and was negative for osteomyelitis (infection of the bone).  You will be discharged on antibiotics and you will need close follow up with orthopedics regarding your wound/wound care.  Please call and set up an appt. Early next week.   Usted fue admitido para su infeccin del pie derecho.  Se obtuvo resonancia magntica y fue negativo para la osteomielitis (infeccin del hueso). Usted ser dado de alta en los antibiticos y necesitar un seguimiento estrecho con la ortopedia con respecto a su herida / cuidado de heridas.

## 2013-02-26 NOTE — Discharge Summary (Signed)
Family Medicine Teaching Service  Discharge Note : Attending Richad Ramsay MD Pager [phone] Office [phone] I have seen and examined this patient, reviewed their chart and discussed discharge planning wit the resident at the time of discharge. I agree with the discharge plan as above.  

## 2013-02-27 LAB — HELICOBACTER PYLORI ABS-IGG+IGA, BLD
H Pylori IgA: 30.1 U/mL — ABNORMAL HIGH (ref <9.0)
H Pylori IgG: 1 {ISR} — ABNORMAL HIGH

## 2013-06-14 ENCOUNTER — Encounter: Payer: Self-pay | Admitting: Podiatry

## 2013-06-14 ENCOUNTER — Ambulatory Visit (INDEPENDENT_AMBULATORY_CARE_PROVIDER_SITE_OTHER): Payer: PRIVATE HEALTH INSURANCE | Admitting: Podiatry

## 2013-06-14 DIAGNOSIS — L97509 Non-pressure chronic ulcer of other part of unspecified foot with unspecified severity: Secondary | ICD-10-CM

## 2013-06-14 NOTE — Progress Notes (Signed)
   Subjective:    Patient ID: Mitchell [name], male    DOB: 10/29/1975, 38 y.o.   MRN: [id number]  HPI Pain in right plantar foot, x 11 months, noted ulcer Treatment with daily dressing changes using xeroform gauze and kerlix.    Review of Systems  Musculoskeletal: Positive for myalgias.  All other systems reviewed and are negative.      Objective:   Physical Exam        Assessment & Plan:

## 2013-06-14 NOTE — Progress Notes (Signed)
Subjective:     Patient ID: Mitchell RuddyCesar PEREZ, male   DOB: 01/03/1976, 38 y.o.   MRN: [id number]  Foot Pain   patient presents with interpreter and security do to present sentence with a: Callus formation distal aspect of his right foot and previous history of bone infection which is required numerous surgeries and removal of bone from the right foot   Review of Systems  All other systems reviewed and are negative.      Objective:   Physical Exam  Nursing note and vitals reviewed. Constitutional: He is oriented to person, place, and time.  Musculoskeletal: Normal range of motion.  Neurological: He is oriented to person, place, and time.  Skin: Skin is dry.   has previous skin grafts and loss of the big toe second toe right foot with abnormal pressure points occurring due to loss metatarsals with keratotic lesion underneath the third metatarsal distal right that has a small area of drainage that is localized superficial with no odor no proximal edema no proximal erythema or lymph node distention     Assessment:     Localized breakdown of tissue that he is doing a very good job of taking care of on a daily basis while in jail    Plan:     H&P performed condition explained through an interpreter. Debridement the tissue from the area flushed and cleaned with no indications of deep exposure and applied Iodosorb sterile dressing. I'm hoping this will close up with daily changes and I instructed if any changes should occur any redness swelling drainage odor he is to go straight to the emergency room and he does understand that Someday due to the abnormal  pressures there is a very good chance that ultimately he will loose this foot

## 2013-06-25 ENCOUNTER — Ambulatory Visit (INDEPENDENT_AMBULATORY_CARE_PROVIDER_SITE_OTHER): Payer: Self-pay | Admitting: Sports Medicine

## 2013-06-25 ENCOUNTER — Encounter: Payer: Self-pay | Admitting: Sports Medicine

## 2013-06-25 VITALS — BP 129/88 | HR 71 | Temp 98.3°F | Ht 65.0 in | Wt 168.5 lb

## 2013-06-25 DIAGNOSIS — K219 Gastro-esophageal reflux disease without esophagitis: Secondary | ICD-10-CM

## 2013-06-25 DIAGNOSIS — E118 Type 2 diabetes mellitus with unspecified complications: Secondary | ICD-10-CM

## 2013-06-25 DIAGNOSIS — M869 Osteomyelitis, unspecified: Secondary | ICD-10-CM

## 2013-06-25 DIAGNOSIS — F1994 Other psychoactive substance use, unspecified with psychoactive substance-induced mood disorder: Secondary | ICD-10-CM

## 2013-06-25 LAB — POCT GLYCOSYLATED HEMOGLOBIN (HGB A1C): Hemoglobin A1C: 7.7

## 2013-06-25 LAB — GLUCOSE, CAPILLARY: GLUCOSE-CAPILLARY: 220 mg/dL — AB (ref 70–99)

## 2013-06-25 MED ORDER — METFORMIN HCL ER 500 MG PO TB24: 500.0000 mg | ORAL_TABLET | Freq: Every day | ORAL | Status: DC

## 2013-06-25 MED ORDER — OMEPRAZOLE 40 MG PO CPDR: 40.0000 mg | DELAYED_RELEASE_CAPSULE | Freq: Every day | ORAL | Status: DC

## 2013-06-25 MED ORDER — GLIPIZIDE 5 MG PO TABS: 5.0000 mg | ORAL_TABLET | Freq: Two times a day (BID) | ORAL | Status: DC

## 2013-06-25 MED ORDER — INSULIN GLARGINE 100 UNIT/ML ~~LOC~~ SOLN: 10.0000 [IU] | Freq: Every day | SUBCUTANEOUS | Status: DC

## 2013-06-25 NOTE — Progress Notes (Signed)
  Mitchell [name] - 38 y.o. male MRN [id number]  Date of birth: Jul 21, 1975  CC, SUBJECTIVE & ROS:     If applicable, see problem based charting for additional problem specific documentation. Chief Complaint  Patient presents with  . Diabetes    using insulin 10Units only unsure of which meds when  . Medication Refill     Patient recently released from incarceration.  Was using insulin but unsure of exact dose.   Seen recently by podiatrist while incarcerated for his chronic osteomyelitis.  No changes.  HISTORY: Past Medical, Surgical, Social, and Family History Reviewed & Updated per EMR.  Pertinent Historical Findings include: Prior alcohol dependence, major depression with prior suicide attempt, type 2 diabetes, GERD, chronic, nonhealing right foot wound with osteomyelitis, status post amputation.  He has been incarcerated for the past 11 months and was just released on Friday without medications.  OBJECTIVE:  BP:129/88 mmHg  HR:71bpm  TEMP:98.3 F (36.8 C)(Oral)  RESP:   HT:5\' 5"  (165.1 cm)   WT:168 lb 8 oz (76.431 kg)  BMI:28.1 Physical Exam GENERAL:  Adult Hispanic male. In no discomfort; no respiratory distress.  PSYCH:  alert and appropriate, good insight  HNEENT:  mmm, no JVD CARDIAC:  RRR, S1/S2 heard, no murmur LUNGS:  CTA B, no wheezes, no crackles ABDOMEN:   EXTREM:  Warm, well perfused.  Moves all 4 extremities spontaneously; no lateralization.  Pedal pulses 1/4.  trace pretibial edema.  There is marked deformity of the left foot,  His significant foot wound appears as a stable chronic wound.  There is no erythema.  There is minimal serous-sanguinous exudate on the dressing from the plantar aspect.   Probable to approximately 4 mm.      ASSESSMENT & PLAN: See problem based charting & AVS for pt instructions.

## 2013-06-25 NOTE — Assessment & Plan Note (Signed)
Problem Based Documentation:    Subjective Report:  Was receiving insulin only while in incarcerated. Taking Lantus 10 units and occasional NovoLog use of unclear dosage.  Pt denies hypoglycemic symptoms/episodes.  No reported polyuria/polydipsia.   Has had some gastrointestinal intolerance to metformin before but has not tried extended release.  Does not have access to insulin at this time until he obtains the [facility] card     Assessment & Plan & Follow up Issues:  Chronic, moderately well controlled condition  -  1. Continue Lantus 10 units every morning.  One vial sample provided today 2. Start metformin 500 mg XR daily, start glipizide 5 mg daily    > Followup home CBGs at recheck, may be able to come off of Lantus.  A1c seems to be well controlled on very low doses of insulin and may respond well to oral anti-diabetics.

## 2013-06-25 NOTE — Patient Instructions (Signed)
Start metformin and Glipizide daily Keep checking your sugars Keep doing the same wound care for your foot I have refilled the stomach medicine  Come back and see Dr. Claiborne Billings in the next 1-2 weeks to follow  Up on the rest of your medical problems.

## 2013-06-29 NOTE — Assessment & Plan Note (Signed)
Patient has been sober since he has been incarcerated for the past 11 months.  Reports remaining sober since discharge

## 2013-06-29 NOTE — Assessment & Plan Note (Signed)
Problem Based Documentation:    Subjective Report:  Chronic ongoing issue.  Reporting overall doing okay he is able to maintain PPI.  Does have some persistent nighttime symptoms.     Assessment & Plan & Follow up Issues:  Chronic condition  - previously well controlled on PPI. 1. Refill

## 2013-06-29 NOTE — Assessment & Plan Note (Signed)
Chronic condition  - recently seen by podiatrist.  Notes reviewed. 1. No changes at this time.  Does not appear acutely infected

## 2013-07-23 ENCOUNTER — Ambulatory Visit: Payer: No Typology Code available for payment source

## 2013-08-06 ENCOUNTER — Encounter: Payer: Self-pay | Admitting: Family Medicine

## 2013-08-06 ENCOUNTER — Ambulatory Visit (INDEPENDENT_AMBULATORY_CARE_PROVIDER_SITE_OTHER): Payer: No Typology Code available for payment source | Admitting: Family Medicine

## 2013-08-06 VITALS — BP 130/82 | HR 78 | Temp 98.2°F | Wt 172.0 lb

## 2013-08-06 DIAGNOSIS — L03119 Cellulitis of unspecified part of limb: Principal | ICD-10-CM

## 2013-08-06 DIAGNOSIS — H0289 Other specified disorders of eyelid: Secondary | ICD-10-CM

## 2013-08-06 DIAGNOSIS — K219 Gastro-esophageal reflux disease without esophagitis: Secondary | ICD-10-CM

## 2013-08-06 DIAGNOSIS — E118 Type 2 diabetes mellitus with unspecified complications: Secondary | ICD-10-CM

## 2013-08-06 DIAGNOSIS — L02419 Cutaneous abscess of limb, unspecified: Secondary | ICD-10-CM

## 2013-08-06 MED ORDER — OMEPRAZOLE 20 MG PO CPDR: 60.0000 mg | DELAYED_RELEASE_CAPSULE | Freq: Every day | ORAL | Status: DC

## 2013-08-06 MED ORDER — TRIAMCINOLONE ACETONIDE 0.1 % EX CREA: 1.0000 "application " | TOPICAL_CREAM | Freq: Two times a day (BID) | CUTANEOUS | Status: DC

## 2013-08-06 MED ORDER — GLIPIZIDE 5 MG PO TABS: 5.0000 mg | ORAL_TABLET | Freq: Two times a day (BID) | ORAL | Status: DC

## 2013-08-06 MED ORDER — METFORMIN HCL ER 500 MG PO TB24: 500.0000 mg | ORAL_TABLET | Freq: Two times a day (BID) | ORAL | Status: DC

## 2013-08-06 MED ORDER — INSULIN GLARGINE 100 UNIT/ML ~~LOC~~ SOLN: 10.0000 [IU] | Freq: Every day | SUBCUTANEOUS | Status: DC

## 2013-08-06 NOTE — Assessment & Plan Note (Addendum)
Will start with low potency steroid cream to use for 2 weeks only - avoid perfumes, colognes, detergents, soap  With scents.  - F/u in 4 weeks

## 2013-08-06 NOTE — Assessment & Plan Note (Signed)
Well healing. No signs of infection today. Small sinus tract from callus area.  - Continue soaks per ortho - Follow up as scheduled with ortho Lajoyce Corners(Duda) - red flags discussed  - Podiatry referral placed today to help with callus formation.

## 2013-08-06 NOTE — Patient Instructions (Signed)
Diabetes y Doroteo Glassmanactividad fsica (Diabetes and Exercise) Hacer actividad fsica con regularidad es muy importante. No se trata slo de perder peso. Tiene muchos otros beneficios, como por ejemplo:  Mejorar el [place] Passestado de Laffertyagilidad, flexibilidad y resistencia.  Aumenta la densidad sea.  Ayuda a Art gallery managercontrolar el peso.  Disminuye la Art gallery managergrasa corporal.  Aumenta la fuerza muscular.  Reduce el estrs y las tensiones.  Mejora el estado de salud general. Las personas diabticas que realizan actividad fsica tienen beneficios adicionales debido al ejercicio:  Reduce el apetito.  Mejora la utilizacin del azcar (glucosa) por parte del organismo.  Ayuda a disminuir o Information systems managercontrolar la glucosa en sangre.  Disminuye la presin arterial.  Ayuda a disminuir los lpidos en sangre (colesterol y triglicridos).  Mejora el uso de la insulina por parte del organismo porque:  Aumenta la sensibilidad del organismo a la insulina.  Reduce las necesidades de insulina del organismo.  Disminuye el riesgo de enfermedad cardaca por la actividad fsica.  Disminuye el colesterol y los triglicridos.  Aumenta los niveles de colesterol bueno (como las lipoprotenas de alta densidad [HDL]) en el organismo.  Disminuye los niveles de glucosa en [place]. SU PLAN DE ACTIVIDAD  Elija una actividad que disfrute y establezca objetivos realistas. Su mdico o educador en diabetes podrn ayudarlo a Tax adviserrealizar una actividad que lo beneficie. Podr dividir las actividades en 2 o 3 sesiones a lo Paediatric nurselargo del da. Hacer esto es tan bueno como hacer una sesin prolongada. Las ideas para los ejercicios incluyen:  Lleve a Multimedia programmerpasear el perro.  Utilice las Microbiologistescaleras en lugar del ascensor.  Baile su cancin favorita.  Haga sus ejercicios favoritos con Leisure centre managerun amigo. RECOMENDACIONES PARA REALIZAR EJERCICIOS CUANDO SE TIENE DIABETES TIPO 1 O TIPO 2   Controle la glucosa en sangre antes de comenzar. Si el nivel de glucosa en sangre es de ms de 240  mg/dl, controle las cetonas en la [place]. Si hay cetonas, no realice actividad fsica.  Evite inyectarse insulina en las zonas del cuerpo que ejercitar. Por ejemplo, evite inyectarse insulina en:  Los brazos, si juega al tenis.  Las piernas, si corre.  Lleve un registro de:  Alimentos que consume antes y despus de Tour managerhacer el ejercicio.  Los momentos esperables de picos de accin de la insulina.  Los niveles de glucosa en sangre antes y despus de hacer ejercicios.  El tipo y cantidad de [place]ividad fsica.  Revise los registros con su mdico. El mdico lo ayudar a Environmental education officerdesarrollar pautas para ajustar la cantidad de alimento y las cantidades de insulina antes y despus de Radio producerhacer ejercicios.  Si toma insulina o agentes hipoglucemiantes por va oral, observe si hay signos y sntomas de hipoglucemia. Ellos son:  Mareos.  Temblores.  Sudoracin.  Escalofros.  Confusin.  Beba gran cantidad de agua mientras hace ejercicios para evitar la deshidratacin o el infarto. Durante la actividad fsica se pierde Data processing manageragua corporal.  Comente con su mdico antes de comenzar un programa de actividad fsica para verificar que sea seguro para usted. Recuerde, cualquier actividad es mejor que ninguna. Document Released: 01/31/2007 Document Revised: 09/13/2012 [facility] Patient Information 2015 [place], [place]. This information is not intended to replace advice given to you by your health care provider. Make sure you discuss any questions you have with your health care provider.  It was a pleasure seeing you again today.  I will place a referral for the foot clinic today. In addition I want you to increase your Lantus dose by 2 units until you  reach a BG in the morning of 110-120.  I also want you to take your metformin twice a day.  I will also call you in an additional prescription (or raised dose) for your stomach pain .

## 2013-08-06 NOTE — Assessment & Plan Note (Signed)
Increased dose today of omeprazole, will add Carafate if he continues to have discomfort. Will be difficult to get him to GI with orange card, but will place referral if he does not respond to increase therapy. ETOH is likely an issue as well.

## 2013-08-06 NOTE — Progress Notes (Signed)
   Subjective:    Patient ID: Mitchell [name], male    DOB: 12/02/1975, 38 y.o.   MRN: [id number]  HPI Mitchell [name] is a 38 [facility]. [facility].  Diabetes: Patient states that his blood sugars fasting in the morning and between 300 to 365. He states she's been taking his metformin 500 mg a day, Lantus 10 units around 4 to 5 PM, and his glipizide as directed. He takes his blood sugars every morning before eating or drinking anything. He endorses some mild neuropathy in his feet. He denies any hypo--hyperglycemic events. Last A1c 7.7 in June.   Foot wound: Patient has been followed by orthopedics and podiatry for foot wound. Last visit 06/14/2013 to podiatry. Has previous history of cellulitis and osteomyelitis with metatarsal amputation and numerous surgeries to his right foot. Patient reports there is still a small amount of serosanguineous drainage coming from the bottom of his foot. He does have a bandage applied to his foot today. He states he's been soaking in water and vinegar, which is approved by his orthopedic. He denies any odor or purulent drainage. He denies any erythema or swelling.  GERD: Patient states he has increased epigastric pain. He was started on omeprazole 40 mg while in the hospital. He states that he is not feeling better and is wondering if he has an ulcer. He does have a history of alcoholism. Patient has been recently incarcerated. He endorses that her taste in the back of his throat. He reportedly has never smoked. Patient is an orange card holder.  Eye-lid irritation: Patient states for the last 2 months he's had bilateral eye irritation on his eyelids. He states that it is itchy and sometimes burns with soap application. He has tried an over-the-counter cream without any improvement. He does not have a similar rash anywhere else on his body.  Review of Systems Per hpi    Objective:   Physical Exam BP 130/82  Pulse 78  Temp(Src)  98.2 F (36.8 C) (Oral)  Wt 172 lb (78.019 kg) Gen: Pleasant, Spanish-speaking male, no acute distress, nontoxic in appearance. HEENT: AT. [place].Bilateral eyes without injections or icterus. MMM.  CV: RRR Chest: CTAB, no wheeze or crackles Abd: Soft. NTND. BS present. No Masses palpated.  Ext: No erythema. No edema. Callus formation right metatarsal head midline. Small sinus tract drainage. Positive for serosanguineous fluid. No purulent drainage. No odor or signs of infection. Amputation of right great toe. Multiple skin grafts. Skin: No rashes, purpura or petechiae.   Foot exam completed today and documented in quality metrics.

## 2013-08-07 NOTE — Assessment & Plan Note (Signed)
Patient instructed with good teach back, to take Lantus 10 units between 9 and 10 PM at night. He is to check his blood sugars every morning and if blood sugars are greater than 110 he is to increase his Lantus dose by 2 units the following evening. He is able to teach back the method and is in understanding. Metformin 500 mg twice a day, increased today. Continue glipizide. Continue checking blood sugars every morning. In adjusting as necessary. We'll followup in 4 weeks to see if blood sugars are better controlled.

## 2013-09-06 ENCOUNTER — Ambulatory Visit (INDEPENDENT_AMBULATORY_CARE_PROVIDER_SITE_OTHER): Payer: No Typology Code available for payment source | Admitting: Family Medicine

## 2013-09-06 ENCOUNTER — Encounter: Payer: Self-pay | Admitting: Family Medicine

## 2013-09-06 ENCOUNTER — Ambulatory Visit (HOSPITAL_COMMUNITY)
Admission: RE | Admit: 2013-09-06 | Discharge: 2013-09-06 | Disposition: A | Discharge status: Home / Self Care | Payer: Self-pay | Source: Ambulatory Visit | Attending: Family Medicine | Admitting: Family Medicine

## 2013-09-06 ENCOUNTER — Telehealth: Payer: Self-pay | Admitting: Family Medicine

## 2013-09-06 VITALS — BP 121/81 | HR 73 | Temp 98.1°F | Ht 65.0 in | Wt 172.7 lb

## 2013-09-06 DIAGNOSIS — L089 Local infection of the skin and subcutaneous tissue, unspecified: Secondary | ICD-10-CM

## 2013-09-06 DIAGNOSIS — E118 Type 2 diabetes mellitus with unspecified complications: Secondary | ICD-10-CM

## 2013-09-06 DIAGNOSIS — S98119A Complete traumatic amputation of unspecified great toe, initial encounter: Secondary | ICD-10-CM | POA: Insufficient documentation

## 2013-09-06 DIAGNOSIS — M869 Osteomyelitis, unspecified: Secondary | ICD-10-CM | POA: Insufficient documentation

## 2013-09-06 LAB — BASIC METABOLIC PANEL
BUN: 10 mg/dL (ref 6–23)
CALCIUM: 9 mg/dL (ref 8.4–10.5)
CO2: 21 mEq/L (ref 19–32)
Chloride: 102 mEq/L (ref 96–112)
Creat: 0.67 mg/dL (ref 0.50–1.35)
Glucose, Bld: 253 mg/dL — ABNORMAL HIGH (ref 70–99)
POTASSIUM: 3.5 meq/L (ref 3.5–5.3)
SODIUM: 132 meq/L — AB (ref 135–145)

## 2013-09-06 LAB — POCT I-STAT CREATININE: CREATININE: 0.9 mg/dL (ref 0.50–1.35)

## 2013-09-06 MED ORDER — CIPROFLOXACIN HCL 500 MG PO TABS: 500.0000 mg | ORAL_TABLET | Freq: Two times a day (BID) | ORAL | Status: DC

## 2013-09-06 MED ORDER — GADOBENATE DIMEGLUMINE 529 MG/ML IV SOLN
17.0000 mL | Freq: Once | INTRAVENOUS | Status: AC | PRN
  Administered 2013-09-06: 17 mL via INTRAVENOUS

## 2013-09-06 MED ORDER — METFORMIN HCL ER (OSM) 500 MG PO TB24: 500.0000 mg | ORAL_TABLET | Freq: Three times a day (TID) | ORAL | Status: DC

## 2013-09-06 MED ORDER — DOXYCYCLINE HYCLATE 100 MG PO TABS: 100.0000 mg | ORAL_TABLET | Freq: Two times a day (BID) | ORAL | Status: DC

## 2013-09-06 NOTE — Assessment & Plan Note (Signed)
Wound on foot at metatarsal heads with increased callus formation, drainage and now open.  Unable to probe to bone during exam. Wound culture obtained and sent. [facility] rule out [facility]omyel[facility]. Antibiotic coverage for MRSA and Pseudomonas (diabetic) with doxycycline and Cipro. Will call patient with results to MRI, may need inpatient status if osteomyelitis. Otherwise we'll attempt to treat as outpatient followup in one week or sooner if redness, swelling, pain or fever increased. Patient instructed to go to the emergency room over the weekend if it does not appear to be healing, if not already an inpatient.

## 2013-09-06 NOTE — Telephone Encounter (Signed)
Please call pt and inform him his MRI of the foot today resulted with "no definite osteomyelitis." I want him to continue the antibiotics we prescribed today and would like him to be scheduled Monday (SDA if needed) to make sure infection is not worsening. [facility] and would be a good reference on Monday. Thanks.

## 2013-09-06 NOTE — Progress Notes (Signed)
   Subjective:    Patient ID: Mitchell [name], male    DOB: 12/25/1975, 38 y.o.   MRN: [id number]  HPI Mitchell [name] is a 38 y.o. male presents to family medicine clinic for diabetes followup  Diabetes: Patient states she's still taking 10 units of Lantus, metformin 500 mg twice a day and his glipizide 5 mg. He is checking his morning glucose only and this morning glucose this a.m. was 242. He states that he is not making the changes we discussed last time because "he forgot ". Patient endorses polydipsia, night sweats and right foot irritation. He denies dizziness, fever or signs of hypoglycemic event. Patient states little over a week ago his right foot appeared to be a little bit more red and irritated. He continues to wear a 4 x 4 with tape on the bottom of his foot secondary to chronic draining wound. Patient states he has not followed up with podiatry or Dr. Lajoyce Cornersuda (ortho).  Review of Systems Per history of present illness    Objective:   Physical Exam BP 121/81  Pulse 73  Temp(Src) 98.1 F (36.7 C) (Oral)  Ht 5\' 5"  (1.651 m)  Wt 172 lb 11.2 oz (78.336 kg)  BMI 28.74 kg/m2 Gen: Pleasant, Spanish-speaking male. No acute distress, nontoxic in appearance. Well-developed, well-nourished. CV: RRR  Chest: CTAB, no wheeze or crackles Ext: Mild erythema right lateral aspect of foot. Callus formation over the plantar metatarsal heads has increased in size. Skin appears more taut laterally than and prior Open wound approximately 1 cm, greater in size than prior. Mild Serosanguineous drainage noted from wound. Unable to probe to bone with sterile swab.       Assessment & Plan:

## 2013-09-06 NOTE — Assessment & Plan Note (Signed)
Concern for right foot osteomyelitis and/or cellulitis, seat specifics on the right foot infection. Metformin increased to 500 mg 3 times a day (on extended release) Continue glipizide Continue checking blood sugars every morning Patient instructed to start her 12 units of Lantus tonight and increase by 2 units nightly if morning blood sugar is above 110. Patient states he understands is able to teach back and give examples. Followup in one week for foot wound

## 2013-09-06 NOTE — Patient Instructions (Signed)
You will need to go have an MRI completed on your foot today @ [facility] We will increase your metformin to 1000 mg twice a day Increase your Lantus 2 units a day as we discussed, if sugar is above 110 in the morning. Start at 12 units of lantus tonight.  I have called you in antibiotics (x2) you will need to take these to completion. I will need to see you next week (or dr. Randolm Idolfletke)

## 2013-09-07 ENCOUNTER — Other Ambulatory Visit: Payer: Self-pay | Admitting: Family Medicine

## 2013-09-07 DIAGNOSIS — E118 Type 2 diabetes mellitus with unspecified complications: Secondary | ICD-10-CM

## 2013-09-07 MED ORDER — METFORMIN HCL ER 500 MG PO TB24: 500.0000 mg | ORAL_TABLET | Freq: Three times a day (TID) | ORAL | Status: DC

## 2013-09-11 LAB — WOUND CULTURE
GRAM STAIN: NONE SEEN
Gram Stain: NONE SEEN

## 2013-09-14 ENCOUNTER — Ambulatory Visit (INDEPENDENT_AMBULATORY_CARE_PROVIDER_SITE_OTHER): Payer: No Typology Code available for payment source | Admitting: Family Medicine

## 2013-09-14 ENCOUNTER — Encounter: Payer: Self-pay | Admitting: Family Medicine

## 2013-09-14 VITALS — BP 115/67 | HR 75 | Temp 98.1°F | Ht 65.0 in | Wt 173.0 lb

## 2013-09-14 DIAGNOSIS — L089 Local infection of the skin and subcutaneous tissue, unspecified: Secondary | ICD-10-CM

## 2013-09-14 MED ORDER — CIPROFLOXACIN HCL 500 MG PO TABS: 500.0000 mg | ORAL_TABLET | Freq: Two times a day (BID) | ORAL | Status: AC

## 2013-09-14 NOTE — Patient Instructions (Addendum)
-   Take cipro 500mg  2 times per day for the next 14 days. - Take metformin 500mg  only 2 times per day until you follow up here at the family medicine clinic.  - You are receiving a referral to the wound care center - they will help you with the wounds.  - Schedule a follow up appointment with Dr. Claiborne BillingsKuneff next week. - If you feel like you are getting more sick or the redness is getting bigger you should either call the clinic or go to the emergency room.   It was good to meet you! - Dr. Jarvis NewcomerGrunz

## 2013-09-14 NOTE — Progress Notes (Signed)
Patient ID: Mitchell [name], male   DOB: 05/22/1975, 38 y.o.   MRN: [id number]   Subjective:   Mitchell [name] is a 38 y.o. male with a history of diabetes here for follow up of right foot wound.  Maureen RalphsCesar was seen by his PCP 8/13 for a wound on the right foot which was concerning for osteomyelitis. MRI showed no definite evidence of this, but antibiotics were continued and he is here for follow up. He reports that the symptoms have generally not improved. He has been taking the prescribed medications as directed but having worsening GI upset and occasionally vomiting shortly after administration. This, he finds, is more likely when he is taking metformin 1g at a time. He denies fevers, chills, myalgias, arthralgias, or rash. His blood sugars have been higher than normal despite taking his metformin as usual.   Office visit conducted with interpretor, Wyvonnia DuskyGraciela Namihira.   Review of Systems:  Per HPI. All other systems reviewed and are negative.   Objective:  BP 115/67  Pulse 75  Temp(Src) 98.1 F (36.7 C) (Oral)  Ht 5\' 5"  (1.651 m)  Wt 173 lb (78.472 kg)  BMI 28.79 kg/m2  Gen: Pleasant spanish-speaking 38 y.o. male in NAD CV: RRR, no murmur Right foot: Sclerotic changes to the skin overlaying the distal lower extremity and foot with slight erythema without overt warmth on the lateral aspect of foot.     Lab Results  Component Value Date   HGBA1C 7.7 06/25/2013   Assessment:     Mitchell [name] is a 38 y.o. male here for cellulitis complicating chronic right foot wounds.     Plan:     See problem list for problem-specific plans.  - Cultures grew abundant S. agalactiae sensitive to ciprofloxacin: Rx ciprofloxacin 500mg  BID x 14 days. - Refer to wound center - Decrease metformin dose to 500mg  BID to improve GI upset and allow adequate medication absorption. Will return to normal dosing/alternative medication per PCP following this acute episode. No history of hospitalization for  hyperglycemia.

## 2013-09-20 ENCOUNTER — Ambulatory Visit: Payer: No Typology Code available for payment source | Attending: Podiatry

## 2013-09-21 ENCOUNTER — Ambulatory Visit: Payer: Self-pay | Admitting: Family Medicine

## 2013-09-27 NOTE — Assessment & Plan Note (Signed)
-   Cultures grew abundant S. agalactiae sensitive to ciprofloxacin: Rx ciprofloxacin  BID x 14 days. - Refer to wound center - Decrease metformin dose to  BID to improve GI upset and allow adequate medication absorption. Will return to normal dosing/alternative medication per PCP following this acute episode. No history of hospitalization for hyperglycemia.

## 2013-10-10 ENCOUNTER — Encounter (HOSPITAL_BASED_OUTPATIENT_CLINIC_OR_DEPARTMENT_OTHER): Payer: No Typology Code available for payment source | Attending: General Surgery

## 2013-10-10 DIAGNOSIS — Z794 Long term (current) use of insulin: Secondary | ICD-10-CM | POA: Insufficient documentation

## 2013-10-10 DIAGNOSIS — E1169 Type 2 diabetes mellitus with other specified complication: Secondary | ICD-10-CM | POA: Insufficient documentation

## 2013-10-10 DIAGNOSIS — L97409 Non-pressure chronic ulcer of unspecified heel and midfoot with unspecified severity: Secondary | ICD-10-CM | POA: Insufficient documentation
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
## 2013-10-18 ENCOUNTER — Ambulatory Visit (INDEPENDENT_AMBULATORY_CARE_PROVIDER_SITE_OTHER): Payer: No Typology Code available for payment source | Admitting: Family Medicine

## 2013-10-18 ENCOUNTER — Encounter: Payer: Self-pay | Admitting: Family Medicine

## 2013-10-18 VITALS — BP 118/76 | HR 60 | Temp 97.4°F | Ht 65.0 in | Wt 174.5 lb

## 2013-10-18 DIAGNOSIS — M545 Low back pain, unspecified: Secondary | ICD-10-CM

## 2013-10-18 DIAGNOSIS — M549 Dorsalgia, unspecified: Secondary | ICD-10-CM | POA: Insufficient documentation

## 2013-10-18 DIAGNOSIS — K219 Gastro-esophageal reflux disease without esophagitis: Secondary | ICD-10-CM

## 2013-10-18 DIAGNOSIS — E118 Type 2 diabetes mellitus with unspecified complications: Secondary | ICD-10-CM

## 2013-10-18 LAB — POCT GLYCOSYLATED HEMOGLOBIN (HGB A1C): Hemoglobin A1C: 8.2

## 2013-10-18 MED ORDER — NAPROXEN 500 MG PO TABS: 500.0000 mg | ORAL_TABLET | Freq: Two times a day (BID) | ORAL | Status: DC

## 2013-10-18 MED ORDER — GLIPIZIDE 5 MG PO TABS: 5.0000 mg | ORAL_TABLET | Freq: Two times a day (BID) | ORAL | Status: DC

## 2013-10-18 MED ORDER — OMEPRAZOLE 20 MG PO CPDR: 60.0000 mg | DELAYED_RELEASE_CAPSULE | Freq: Every day | ORAL | Status: DC

## 2013-10-18 MED ORDER — SUCRALFATE 1 G PO TABS: 1.0000 g | ORAL_TABLET | Freq: Three times a day (TID) | ORAL | Status: DC

## 2013-10-18 NOTE — Assessment & Plan Note (Signed)
As office visit was in doing patient mentions lower back pain. Does not seem to be a traumatic event and pain is not radiating. Advised patient to continue heating pad, recommended stretching daily and prescribe naproxen taken no more than 2 times a day for pain. Patient is to followup if pain is not improved in 2 weeks, or sooner if needed

## 2013-10-18 NOTE — Assessment & Plan Note (Signed)
A1c 8.2 today, this is an increase from last time. Patient has not been taking medications as directed. Spoke in great detail with interpreter and had interpreter type directions of medications on his AVS today. Patient is to take glipizide 5 mg twice a day, metformin 1 g daily, Lantus 16 units, with taper of 2 units a day if not at  goal, and until he reaches fasting blood glucose below 110. Followup one month.

## 2013-10-18 NOTE — Progress Notes (Signed)
   Subjective:    Patient ID: Mitchell [name], male    DOB: September 01, 1975, 38 y.o.   MRN: [id number]  HPI Mitchell [name] is a 38 y.o. male present to family medicine clinic for diabetes followup  Diabetes: Patient states that he takes 1 g of metformin daily. He is only taking his glipizide once daily. He is now at 14 units of Lantus at night. Have extensively car in the past 2 appointments with interpreter about increasing Lantus doses nightly by 2 units, until he reaches fasting morning glucose below 110. Although each time he states he understands, is able to teach back method, he never increases his Lantus dose above 2 units at each visit. He reports a mixup at the pharmacy and has labels because it tells him to take his glipizide once a day on his home bottle. He is seeing a podiatrist for his right foot infection/skin breakdown. He has been following them once a week. He reports his foot is much improved, he completed all antibiotics. She is wearing a shoe boot today.  GERD: Patient still complains of nausea and burning sensation in his stomach. He states to worse during the day after he eats. He does not feel is associated with any particular type of food. It is not worse when lying flat. He does notice a burning sensation and a sour taste in the back of his throat in the mornings. He denies nighttime cough. He has been taking his Prilosec 20 mg once a day, he was supposed to take 60 mg a day for 4 weeks. He noticed increase in symptoms while on antibiotics, but that has decreased since the completion of his antibiotics. He denies any other GI related issues. He is on metformin as well, but not having any GI related issues. Patient has abused alcohol in the past, but has been "clean "for almost a year and a half.  Back pain: At the end of our office visit today, patient stated he's been having lower back pain. He has been using a heating pad on occasions. He is not taking any medications for pain. He  had no trauma or enticing event for pain. He reports no redness, bruising or swelling.  Review of Systems History of present illness    Objective:   Physical Exam BP 118/76  Pulse 60  Temp(Src) 97.4 F (36.3 C) (Oral)  Ht  (1.651 m)  Wt 174 lb 8 oz (79.153 kg)  BMI 29.04 kg/m2 Gen: Pleasant, overweight, Spanish-speaking male. Well-developed, well-nourished. No acute distress, nontoxic in appearance. CV: RRR no murmur Chest: CTAB, no wheeze or crackles Muscle skeletal: No redness, swelling or bruising. Mild to moderate tissue texture changes right lower lumbar. Ext: No erythema. No edema. Shoe boot/nonweightbearing applied to right foot..      Assessment & Plan:

## 2013-10-18 NOTE — Assessment & Plan Note (Signed)
Patient had some confusion on medication directions. I had interpreter type directions for his medications today for complete clarification. Prescribed 1 g of Carafate 3 times a day meals. Prilosec 60 mg daily. Patient is to take medications for 4 weeks, and then have followup. If he is still having symptoms at 4 weeks we will attempt to get him into a GI referral, although this is difficult and orange  Card holder.

## 2013-10-18 NOTE — Patient Instructions (Addendum)
Tome prilosec 60 mg diario igual a 3 pildoras al dia por 4 semanas. Tome Carafate 1 mg 3 veces al  dia por 4 semanas Continue con el metformin 1 mg diario Tome glipizide 5 mg dos veces al dia

## 2013-10-31 ENCOUNTER — Encounter (HOSPITAL_BASED_OUTPATIENT_CLINIC_OR_DEPARTMENT_OTHER): Payer: No Typology Code available for payment source | Attending: General Surgery

## 2013-10-31 DIAGNOSIS — L97512 Non-pressure chronic ulcer of other part of right foot with fat layer exposed: Secondary | ICD-10-CM | POA: Insufficient documentation

## 2013-10-31 DIAGNOSIS — L97412 Non-pressure chronic ulcer of right heel and midfoot with fat layer exposed: Secondary | ICD-10-CM | POA: Insufficient documentation

## 2013-10-31 DIAGNOSIS — E11621 Type 2 diabetes mellitus with foot ulcer: Secondary | ICD-10-CM | POA: Insufficient documentation

## 2013-11-07 ENCOUNTER — Ambulatory Visit (HOSPITAL_COMMUNITY)
Admission: RE | Admit: 2013-11-07 | Discharge: 2013-11-07 | Disposition: A | Discharge status: Home / Self Care | Payer: No Typology Code available for payment source | Source: Ambulatory Visit | Attending: General Surgery | Admitting: General Surgery

## 2013-11-07 ENCOUNTER — Other Ambulatory Visit (HOSPITAL_COMMUNITY): Payer: Self-pay | Admitting: General Surgery

## 2013-11-07 DIAGNOSIS — E119 Type 2 diabetes mellitus without complications: Secondary | ICD-10-CM | POA: Insufficient documentation

## 2013-11-07 DIAGNOSIS — M869 Osteomyelitis, unspecified: Secondary | ICD-10-CM | POA: Insufficient documentation

## 2013-11-16 ENCOUNTER — Ambulatory Visit: Payer: Self-pay | Admitting: Family Medicine

## 2013-11-28 ENCOUNTER — Encounter (HOSPITAL_BASED_OUTPATIENT_CLINIC_OR_DEPARTMENT_OTHER): Payer: No Typology Code available for payment source | Attending: General Surgery

## 2013-11-28 DIAGNOSIS — E11621 Type 2 diabetes mellitus with foot ulcer: Secondary | ICD-10-CM | POA: Insufficient documentation

## 2013-11-28 DIAGNOSIS — L97514 Non-pressure chronic ulcer of other part of right foot with necrosis of bone: Secondary | ICD-10-CM | POA: Insufficient documentation

## 2013-11-29 ENCOUNTER — Ambulatory Visit (INDEPENDENT_AMBULATORY_CARE_PROVIDER_SITE_OTHER): Payer: No Typology Code available for payment source | Admitting: Family Medicine

## 2013-11-29 ENCOUNTER — Encounter: Payer: Self-pay | Admitting: Family Medicine

## 2013-11-29 VITALS — BP 119/71 | HR 80 | Temp 98.3°F | Resp 20 | Wt 178.0 lb

## 2013-11-29 DIAGNOSIS — L089 Local infection of the skin and subcutaneous tissue, unspecified: Secondary | ICD-10-CM

## 2013-11-29 DIAGNOSIS — J0100 Acute maxillary sinusitis, unspecified: Secondary | ICD-10-CM

## 2013-11-29 DIAGNOSIS — E118 Type 2 diabetes mellitus with unspecified complications: Secondary | ICD-10-CM

## 2013-11-29 DIAGNOSIS — J329 Chronic sinusitis, unspecified: Secondary | ICD-10-CM | POA: Insufficient documentation

## 2013-11-29 DIAGNOSIS — K219 Gastro-esophageal reflux disease without esophagitis: Secondary | ICD-10-CM

## 2013-11-29 MED ORDER — AMOXICILLIN 500 MG PO CAPS: 500.0000 mg | ORAL_CAPSULE | Freq: Three times a day (TID) | ORAL | Status: DC

## 2013-11-29 NOTE — Patient Instructions (Signed)
I have called you in a medication called on amoxicillin for you to take for your sinus infection.please take it as the label reads until it is completed. I have also called in a medication called Flonase to help shorten the course of your sinus infection Lantus 20 units starting this evening  Metformin you will start taking 3 times a day Glipizide she will take 2 times a day If you are not feeling better in 5 days, or see an improvement in your blood sugars, and please call in to be seen again.

## 2013-11-29 NOTE — Assessment & Plan Note (Addendum)
Patient doing well, symptoms have resolved with aggressive reflux treatment. 60 mg of omeprazole daily, with 1 g Carafate 4 times a day. We'll attempt to back down off of high-dose omeprazole in December.
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
## 2013-11-29 NOTE — Assessment & Plan Note (Signed)
Patient able to read back her blood glucoses today and medication regimen correctly. - Increased Lantus to 20 units nightly - Metformin to be taken 3 times a day, this is an increase from 2 times a day due to his stomach upset that is now completely resolved. - Glipizide 2 times a day - Continue to take fasting morning glucose - Follow-up after Christmas for A1c and diabetes follow-up

## 2013-11-29 NOTE — Assessment & Plan Note (Signed)
Patient with signs and symptoms of acute sinusitis, with elevated blood glucose over the last 3 days. - Amoxicillin 500 mg 3 times a day - Flonase prescribed - Patient to follow-up if he sees no improvement in 5-7 days, or if his sugars are still elevated

## 2013-11-29 NOTE — Assessment & Plan Note (Signed)
Right foot infection is healing very well. Patient continues to seem wound care, does not appear to be infected.

## 2013-12-12 ENCOUNTER — Ambulatory Visit: Payer: Self-pay

## 2013-12-26 ENCOUNTER — Encounter (HOSPITAL_BASED_OUTPATIENT_CLINIC_OR_DEPARTMENT_OTHER): Payer: Self-pay | Attending: General Surgery

## 2013-12-26 DIAGNOSIS — E11621 Type 2 diabetes mellitus with foot ulcer: Secondary | ICD-10-CM | POA: Insufficient documentation

## 2013-12-26 DIAGNOSIS — L97519 Non-pressure chronic ulcer of other part of right foot with unspecified severity: Secondary | ICD-10-CM | POA: Insufficient documentation

## 2014-01-08 ENCOUNTER — Ambulatory Visit: Payer: Self-pay

## 2014-01-10 ENCOUNTER — Ambulatory Visit (INDEPENDENT_AMBULATORY_CARE_PROVIDER_SITE_OTHER): Payer: Self-pay | Admitting: Family Medicine

## 2014-01-10 ENCOUNTER — Encounter: Payer: Self-pay | Admitting: Family Medicine

## 2014-01-10 VITALS — BP 120/80 | HR 70 | Temp 98.1°F | Ht 65.0 in | Wt 170.0 lb

## 2014-01-10 DIAGNOSIS — E118 Type 2 diabetes mellitus with unspecified complications: Secondary | ICD-10-CM

## 2014-01-10 MED ORDER — INSULIN GLARGINE 100 UNIT/ML ~~LOC~~ SOLN: 22.0000 [IU] | Freq: Every day | SUBCUTANEOUS | Status: DC

## 2014-01-10 NOTE — Assessment & Plan Note (Signed)
-   Patient is reading back his diabetic regimen without difficulty. Refill on his Lantus was completed today. - Continue metformin and glipizide - Patient to take 22 units of Lantus this evening, he is to keep testing his blood glucose especially fasting in the morning. If his fasting blood glucose is above 100 he is to add 1 unit of Lantus that evening. He is to stop adding 1 unit of Lantus when his fasting blood glucose is below 100. She is to then take whatever dose of Lantus was the night before every night until he sees me in 5 weeks.

## 2014-01-10 NOTE — Patient Instructions (Signed)
You are doing great with your diabetes. Start with 22 units of Lantus tonight, and go up 1 unit every evening if your blood sugar is above 100 fasting in the morning. Once you get your blood sugar to be 100 or below, what ever dose you took them evening prior stay on that dose until you see me again in mid January. Into your metformin 3 times a day, injured glipizide 2 times a day.  Make an appointment mid January to see me and we'll do your A1c at that time.
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
## 2014-01-19 ENCOUNTER — Inpatient Hospital Stay (HOSPITAL_COMMUNITY)
Admission: EM | Admit: 2014-01-19 | Discharge: 2014-01-21 | DRG: 638 | Disposition: A | Discharge status: Home / Self Care | Payer: Medicaid Other | Attending: Family Medicine | Admitting: Family Medicine

## 2014-01-19 ENCOUNTER — Encounter (HOSPITAL_COMMUNITY): Payer: Self-pay | Admitting: Emergency Medicine

## 2014-01-19 ENCOUNTER — Emergency Department (HOSPITAL_COMMUNITY): Payer: Medicaid Other

## 2014-01-19 DIAGNOSIS — F329 Major depressive disorder, single episode, unspecified: Secondary | ICD-10-CM | POA: Diagnosis present

## 2014-01-19 DIAGNOSIS — L97519 Non-pressure chronic ulcer of other part of right foot with unspecified severity: Secondary | ICD-10-CM | POA: Diagnosis present

## 2014-01-19 DIAGNOSIS — E876 Hypokalemia: Secondary | ICD-10-CM | POA: Diagnosis present

## 2014-01-19 DIAGNOSIS — L03115 Cellulitis of right lower limb: Secondary | ICD-10-CM | POA: Diagnosis present

## 2014-01-19 DIAGNOSIS — D696 Thrombocytopenia, unspecified: Secondary | ICD-10-CM | POA: Diagnosis present

## 2014-01-19 DIAGNOSIS — K7460 Unspecified cirrhosis of liver: Secondary | ICD-10-CM | POA: Diagnosis present

## 2014-01-19 DIAGNOSIS — L0390 Cellulitis, unspecified: Secondary | ICD-10-CM | POA: Diagnosis present

## 2014-01-19 DIAGNOSIS — E669 Obesity, unspecified: Secondary | ICD-10-CM | POA: Diagnosis present

## 2014-01-19 DIAGNOSIS — D649 Anemia, unspecified: Secondary | ICD-10-CM | POA: Diagnosis present

## 2014-01-19 DIAGNOSIS — K219 Gastro-esophageal reflux disease without esophagitis: Secondary | ICD-10-CM | POA: Diagnosis present

## 2014-01-19 DIAGNOSIS — E11621 Type 2 diabetes mellitus with foot ulcer: Secondary | ICD-10-CM | POA: Diagnosis not present

## 2014-01-19 DIAGNOSIS — L089 Local infection of the skin and subcutaneous tissue, unspecified: Secondary | ICD-10-CM | POA: Insufficient documentation

## 2014-01-19 DIAGNOSIS — L89892 Pressure ulcer of other site, stage 2: Secondary | ICD-10-CM | POA: Diagnosis present

## 2014-01-19 DIAGNOSIS — T148XXA Other injury of unspecified body region, initial encounter: Secondary | ICD-10-CM

## 2014-01-19 DIAGNOSIS — F39 Unspecified mood [affective] disorder: Secondary | ICD-10-CM | POA: Diagnosis present

## 2014-01-19 DIAGNOSIS — Z6832 Body mass index (BMI) 32.0-32.9, adult: Secondary | ICD-10-CM

## 2014-01-19 DIAGNOSIS — F1010 Alcohol abuse, uncomplicated: Secondary | ICD-10-CM | POA: Diagnosis present

## 2014-01-19 LAB — GLUCOSE, CAPILLARY: Glucose-Capillary: 132 mg/dL — ABNORMAL HIGH (ref 70–99)

## 2014-01-19 LAB — CBC WITH DIFFERENTIAL/PLATELET
BASOS ABS: 0 10*3/uL (ref 0.0–0.1)
Basophils Relative: 0 % (ref 0–1)
EOS ABS: 0 10*3/uL (ref 0.0–0.7)
EOS PCT: 0 % (ref 0–5)
HCT: 43.6 % (ref 39.0–52.0)
Hemoglobin: 15.8 g/dL (ref 13.0–17.0)
Lymphocytes Relative: 9 % — ABNORMAL LOW (ref 12–46)
Lymphs Abs: 0.6 10*3/uL — ABNORMAL LOW (ref 0.7–4.0)
MCH: 32 pg (ref 26.0–34.0)
MCHC: 36.2 g/dL — ABNORMAL HIGH (ref 30.0–36.0)
MCV: 88.4 fL (ref 78.0–100.0)
MONO ABS: 0.6 10*3/uL (ref 0.1–1.0)
Monocytes Relative: 8 % (ref 3–12)
NEUTROS PCT: 83 % — AB (ref 43–77)
Neutro Abs: 5.9 10*3/uL (ref 1.7–7.7)
Platelets: 67 10*3/uL — ABNORMAL LOW (ref 150–400)
RBC: 4.93 MIL/uL (ref 4.22–5.81)
RDW: 12.8 % (ref 11.5–15.5)
WBC: 7.1 10*3/uL (ref 4.0–10.5)

## 2014-01-19 LAB — BASIC METABOLIC PANEL WITH GFR
Anion gap: 8 (ref 5–15)
BUN: 8 mg/dL (ref 6–23)
CO2: 22 mmol/L (ref 19–32)
Calcium: 9.1 mg/dL (ref 8.4–10.5)
Chloride: 106 meq/L (ref 96–112)
Creatinine, Ser: 0.64 mg/dL (ref 0.50–1.35)
GFR calc Af Amer: >90 mL/min (ref >90)
GFR calc non Af Amer: >90 mL/min (ref >90)
Glucose, Bld: 128 mg/dL — ABNORMAL HIGH (ref 70–99)
Potassium: 3.2 mmol/L — ABNORMAL LOW (ref 3.5–5.1)
Sodium: 136 mmol/L (ref 135–145)

## 2014-01-19 LAB — I-STAT CG4 LACTIC ACID, ED: Lactic Acid, Venous: 0.84 mmol/L (ref 0.5–2.2)

## 2014-01-19 MED ORDER — HYDROMORPHONE HCL 1 MG/ML IJ SOLN
0.5000 mg | INTRAMUSCULAR | Status: DC | PRN
Start: 2014-01-19 — End: 2014-01-20

## 2014-01-19 MED ORDER — ONDANSETRON HCL 4 MG/2ML IJ SOLN: 4.0000 mg | Freq: Three times a day (TID) | INTRAMUSCULAR | Status: AC | PRN

## 2014-01-19 MED ORDER — SODIUM CHLORIDE 0.9 % IV SOLN
INTRAVENOUS | Status: DC
  Administered 2014-01-19: 18:00:00 via INTRAVENOUS

## 2014-01-19 MED ORDER — SODIUM CHLORIDE 0.9 % IV BOLUS (SEPSIS)
1000.0000 mL | Freq: Once | INTRAVENOUS | Status: AC
  Administered 2014-01-19: 1000 mL via INTRAVENOUS

## 2014-01-19 MED ORDER — ONDANSETRON 4 MG PO TBDP
4.0000 mg | ORAL_TABLET | Freq: Once | ORAL | Status: AC
  Administered 2014-01-19: 4 mg via ORAL
  Filled 2014-01-19: qty 1

## 2014-01-19 MED ORDER — CLINDAMYCIN PHOSPHATE 600 MG/50ML IV SOLN
600.0000 mg | Freq: Once | INTRAVENOUS | Status: AC
  Administered 2014-01-19: 600 mg via INTRAVENOUS
  Filled 2014-01-19: qty 50

## 2014-01-19 MED ORDER — OXYCODONE-ACETAMINOPHEN 5-325 MG PO TABS
1.0000 | ORAL_TABLET | Freq: Once | ORAL | Status: AC
  Administered 2014-01-19: 1 via ORAL
  Filled 2014-01-19: qty 1

## 2014-01-19 MED ORDER — SODIUM CHLORIDE 0.9 % IV SOLN: INTRAVENOUS | Status: DC
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
## 2014-01-20 DIAGNOSIS — L97519 Non-pressure chronic ulcer of other part of right foot with unspecified severity: Secondary | ICD-10-CM

## 2014-01-20 DIAGNOSIS — L03115 Cellulitis of right lower limb: Secondary | ICD-10-CM

## 2014-01-20 DIAGNOSIS — L0390 Cellulitis, unspecified: Secondary | ICD-10-CM | POA: Diagnosis present

## 2014-01-20 LAB — ALBUMIN: Albumin: 3.5 g/dL (ref 3.5–5.2)

## 2014-01-20 LAB — GLUCOSE, CAPILLARY
GLUCOSE-CAPILLARY: 184 mg/dL — AB (ref 70–99)
Glucose-Capillary: 243 mg/dL — ABNORMAL HIGH (ref 70–99)
Glucose-Capillary: 221 mg/dL — ABNORMAL HIGH (ref 70–99)

## 2014-01-20 LAB — BASIC METABOLIC PANEL
ANION GAP: 6 (ref 5–15)
BUN: 6 mg/dL (ref 6–23)
CALCIUM: 8.2 mg/dL — AB (ref 8.4–10.5)
CO2: 23 mmol/L (ref 19–32)
Chloride: 106 mEq/L (ref 96–112)
Creatinine, Ser: 0.73 mg/dL (ref 0.50–1.35)
GFR calc non Af Amer: >90 mL/min (ref >90)
Glucose, Bld: 159 mg/dL — ABNORMAL HIGH (ref 70–99)
Potassium: 3.1 mmol/L — ABNORMAL LOW (ref 3.5–5.1)
SODIUM: 135 mmol/L (ref 135–145)

## 2014-01-20 LAB — HIV ANTIBODY (ROUTINE TESTING W REFLEX): HIV 1&2 Ab, 4th Generation: NONREACTIVE

## 2014-01-20 LAB — C-REACTIVE PROTEIN: CRP: 1.1 mg/dL — AB (ref <0.60)

## 2014-01-20 LAB — CBC
HCT: 37.7 % — ABNORMAL LOW (ref 39.0–52.0)
Hemoglobin: 13.8 g/dL (ref 13.0–17.0)
MCH: 32 pg (ref 26.0–34.0)
MCHC: 36.6 g/dL — ABNORMAL HIGH (ref 30.0–36.0)
MCV: 87.5 fL (ref 78.0–100.0)
PLATELETS: 50 10*3/uL — AB (ref 150–400)
RBC: 4.31 MIL/uL (ref 4.22–5.81)
RDW: 13.1 % (ref 11.5–15.5)
WBC: 3.6 10*3/uL — ABNORMAL LOW (ref 4.0–10.5)

## 2014-01-20 LAB — HEMOGLOBIN A1C
Hgb A1c MFr Bld: 7.2 % — ABNORMAL HIGH (ref <5.7)
Mean Plasma Glucose: 160 mg/dL — ABNORMAL HIGH (ref <117)

## 2014-01-20 LAB — SEDIMENTATION RATE: SED RATE: 1 mm/h (ref 0–16)

## 2014-01-20 LAB — PROTIME-INR
INR: 1.42 (ref 0.00–1.49)
Prothrombin Time: 17.5 seconds — ABNORMAL HIGH (ref 11.6–15.2)

## 2014-01-20 MED ORDER — CLINDAMYCIN PHOSPHATE 300 MG/50ML IV SOLN
300.0000 mg | Freq: Four times a day (QID) | INTRAVENOUS | Status: DC
  Filled 2014-01-20 (×3): qty 50

## 2014-01-20 MED ORDER — INSULIN GLARGINE 100 UNIT/ML ~~LOC~~ SOLN
15.0000 [IU] | Freq: Every day | SUBCUTANEOUS | Status: DC
  Administered 2014-01-20 (×2): 15 [IU] via SUBCUTANEOUS
  Filled 2014-01-20 (×3): qty 0.15

## 2014-01-20 MED ORDER — POTASSIUM CHLORIDE CRYS ER 20 MEQ PO TBCR
40.0000 meq | EXTENDED_RELEASE_TABLET | Freq: Two times a day (BID) | ORAL | Status: AC
  Administered 2014-01-20 (×2): 40 meq via ORAL
  Filled 2014-01-20 (×2): qty 2

## 2014-01-20 MED ORDER — PNEUMOCOCCAL 13-VAL CONJ VACC IM SUSP
0.5000 mL | INTRAMUSCULAR | Status: DC
  Filled 2014-01-20: qty 0.5

## 2014-01-20 MED ORDER — INSULIN ASPART 100 UNIT/ML ~~LOC~~ SOLN
0.0000 [IU] | Freq: Three times a day (TID) | SUBCUTANEOUS | Status: DC
  Administered 2014-01-20 (×2): 3 [IU] via SUBCUTANEOUS
  Administered 2014-01-20: 2 [IU] via SUBCUTANEOUS
  Administered 2014-01-21 (×2): 3 [IU] via SUBCUTANEOUS

## 2014-01-20 MED ORDER — PANTOPRAZOLE SODIUM 40 MG PO TBEC
40.0000 mg | DELAYED_RELEASE_TABLET | Freq: Every day | ORAL | Status: DC
  Administered 2014-01-20 – 2014-01-21 (×2): 40 mg via ORAL
  Filled 2014-01-20 (×2): qty 1

## 2014-01-20 MED ORDER — SODIUM CHLORIDE 0.9 % IV SOLN
INTRAVENOUS | Status: DC
  Administered 2014-01-20 (×2): via INTRAVENOUS

## 2014-01-20 MED ORDER — OXYCODONE-ACETAMINOPHEN 5-325 MG PO TABS
1.0000 | ORAL_TABLET | ORAL | Status: DC | PRN
  Administered 2014-01-20 – 2014-01-21 (×6): 1 via ORAL
  Filled 2014-01-20 (×6): qty 1

## 2014-01-20 MED ORDER — MORPHINE SULFATE 4 MG/ML IJ SOLN: 4.0000 mg | INTRAMUSCULAR | Status: DC | PRN

## 2014-01-20 MED ORDER — OXYCODONE-ACETAMINOPHEN 5-325 MG PO TABS: 1.0000 | ORAL_TABLET | ORAL | Status: DC | PRN

## 2014-01-20 MED ORDER — INFLUENZA VAC SPLIT QUAD 0.5 ML IM SUSY
0.5000 mL | PREFILLED_SYRINGE | INTRAMUSCULAR | Status: AC
  Administered 2014-01-21: 0.5 mL via INTRAMUSCULAR
  Filled 2014-01-20: qty 0.5

## 2014-01-20 MED ORDER — CLINDAMYCIN HCL 300 MG PO CAPS
300.0000 mg | ORAL_CAPSULE | Freq: Four times a day (QID) | ORAL | Status: DC
  Administered 2014-01-20 – 2014-01-21 (×5): 300 mg via ORAL
  Filled 2014-01-20 (×8): qty 1

## 2014-01-20 MED ORDER — CLINDAMYCIN PHOSPHATE 600 MG/50ML IV SOLN
600.0000 mg | Freq: Four times a day (QID) | INTRAVENOUS | Status: DC
  Administered 2014-01-20 (×2): 600 mg via INTRAVENOUS
  Filled 2014-01-20 (×4): qty 50

## 2014-01-20 MED ORDER — HEPARIN SODIUM (PORCINE) 5000 UNIT/ML IJ SOLN
5000.0000 [IU] | Freq: Three times a day (TID) | INTRAMUSCULAR | Status: DC
  Filled 2014-01-20 (×3): qty 1

## 2014-01-20 NOTE — Consult Note (Addendum)
WOC wound consult note Reason for Consult: Consult requested for right foot.  Pt had a skin graft procedure several days ago according to EMR.  He has extensive dry intact scar tissue to right foot and leg, no open wounds or drainage. Right outer ankle with partail thickness wound pink and dry; .5X.5X.1cm.  Bottom of foot with valley; skin over the top and darker in color.  No odor or drainage.  Communicated with patient using limited Spanish.  He states he was told to apply dry gauze and kerlex to protect site Q day.  No other cream or medicine was ordered.  He can resume follow-up with the physician who applied the graft after discharge. Please re-consult if further assistance is needed.  Thank-you,  Cammie Mcgeeawn Earnestine Tuohey MSN, RN, CWOCN, [place]CN-AP, CNS [phone]

## 2014-01-20 NOTE — Progress Notes (Signed)
**  Interval Note**  Visited patient this afternoon.  Patient continues to endorse a good amount of pain in his RLE.  He states that it has gradually escalated over the last several days.  He does report that the Percocet helps.  I informed him that he can ask for this every 4 hours if needed.  He appears unaware of this and now voices good understanding.  He also reports to me that he has an appointment with the physician managing his foot on this upcoming Tuesday.  He voices concern for amputation of his foot, as he has had a "bone infection in the past in one of his digits that required amputation".  I informed him that I did not expect that would be the case, as Dr Caroleen Hammanumley has already transitioned him to oral abx, which is reassuring.  Patient voiced great appreciation for this information.  On exam, patient is well appearing, he is resting in bed, his RLE has significant scarring & hyperpigmentation, surgically absent 1st and second digits, no erythema or drainage appreciated.  Wedge pillow ordered for elevation of RLE.  Continue PO Clinda with likely discharge tomorrow and close follow up with wound care doctor.  I personally conducted conversation in Spanish and answered all of patient's questions at present.  Shams Fill M. Nadine CountsGottschalk, DO PGY-1, [facility]

## 2014-01-21 ENCOUNTER — Other Ambulatory Visit: Payer: Self-pay | Admitting: Family Medicine

## 2014-01-21 DIAGNOSIS — T798XXD Other early complications of trauma, subsequent encounter: Secondary | ICD-10-CM

## 2014-01-21 DIAGNOSIS — L089 Local infection of the skin and subcutaneous tissue, unspecified: Secondary | ICD-10-CM | POA: Insufficient documentation

## 2014-01-21 DIAGNOSIS — T148XXA Other injury of unspecified body region, initial encounter: Secondary | ICD-10-CM

## 2014-01-21 LAB — CBC
HCT: 38.6 % — ABNORMAL LOW (ref 39.0–52.0)
Hemoglobin: 13.9 g/dL (ref 13.0–17.0)
MCH: 32.3 pg (ref 26.0–34.0)
MCHC: 36 g/dL (ref 30.0–36.0)
MCV: 89.6 fL (ref 78.0–100.0)
PLATELETS: 64 10*3/uL — AB (ref 150–400)
RBC: 4.31 MIL/uL (ref 4.22–5.81)
RDW: 13.1 % (ref 11.5–15.5)
WBC: 3.1 10*3/uL — AB (ref 4.0–10.5)

## 2014-01-21 LAB — BASIC METABOLIC PANEL
Anion gap: 6 (ref 5–15)
BUN: 6 mg/dL (ref 6–23)
CO2: 24 mmol/L (ref 19–32)
Calcium: 8.1 mg/dL — ABNORMAL LOW (ref 8.4–10.5)
Chloride: 106 mEq/L (ref 96–112)
Creatinine, Ser: 0.74 mg/dL (ref 0.50–1.35)
GFR calc non Af Amer: >90 mL/min (ref >90)
GLUCOSE: 298 mg/dL — AB (ref 70–99)
POTASSIUM: 4 mmol/L (ref 3.5–5.1)
SODIUM: 136 mmol/L (ref 135–145)

## 2014-01-21 LAB — GLUCOSE, CAPILLARY
GLUCOSE-CAPILLARY: 218 mg/dL — AB (ref 70–99)
Glucose-Capillary: 224 mg/dL — ABNORMAL HIGH (ref 70–99)

## 2014-01-21 MED ORDER — GLUCERNA SHAKE PO LIQD: 237.0000 mL | Freq: Two times a day (BID) | ORAL | Status: DC

## 2014-01-21 MED ORDER — GLUCERNA SHAKE PO LIQD
237.0000 mL | Freq: Two times a day (BID) | ORAL | Status: DC
  Administered 2014-01-21: 237 mL via ORAL

## 2014-01-21 MED ORDER — PRO-STAT SUGAR FREE PO LIQD: 30.0000 mL | Freq: Every day | ORAL | Status: DC

## 2014-01-21 MED ORDER — CLINDAMYCIN HCL 300 MG PO CAPS: 300.0000 mg | ORAL_CAPSULE | Freq: Four times a day (QID) | ORAL | Status: AC

## 2014-01-21 MED ORDER — PRO-STAT SUGAR FREE PO LIQD
30.0000 mL | Freq: Every day | ORAL | Status: DC
  Administered 2014-01-21: 30 mL via ORAL
  Filled 2014-01-21: qty 30

## 2014-01-21 MED FILL — Morphine Sulfate Inj 10 MG/ML: INTRAMUSCULAR | Qty: 1 | Status: AC

## 2014-01-21 NOTE — Progress Notes (Signed)
Utilization review completed. Ceazia Harb, RN, BSN. 
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
## 2014-01-21 NOTE — Discharge Summary (Signed)
[facility] Discharge Summary  Patient name: Mitchell [name] Medical record number: [id number] Date of birth: 10-12-1975 Age: 38 y.o. Gender: male Date of Admission: 01/19/2014  Date of Discharge: 01/21/2014  Admitting Physician: Lind Covert, MD  Primary Care Provider: Howard Pouch, DO Consultants: None  Indication for Hospitalization: Diabetic Foot Ulcer with Cellitis  Discharge Diagnoses/Problem List:  Diabetic Foot Ulcer Cellulitis Hypokalemia Diabetes  Disposition: Discharge Home  Discharge Condition: [place] Hospital Course:  [name] is a 38yo male who presented to [facility] on 12/26 with right foot pain, body aches, chills, and subjective fever following a skin graft on 01/16/14. He was transferred to [facility] on 12/27. WBC was 7.1 at admission. Lactic acid 0.84. ESR 1, HIV nonreactive. CRP 1.1. Xray of right foot showed no acute findings. IV Clindamycin was initiated on 12/26 at [facility]. Transitioned to oral Clindamycin on 12/27.   Wound Care was consulted. Recommend outpatient follow up with wound clinic.  [name] was noted to by thrombocyotpenic with platelets of 67. Heparin was started initially, however platelets on 12/27 were 50 and heparin was discontinued.   Patient showed improvement with IV then PO Clindamycin. No fevers throughout stay. Was deemed medically stable for DC with close f/u.  Issues for Follow Up:  - Follow up with wound clinic - Recommendation to f/u patient's Platelet levels.  Significant Procedures: None  Significant Labs and Imaging:   Recent Labs Lab 01/19/14 1747 01/20/14 0130 01/21/14 1040  WBC 7.1 3.6* 3.1*  HGB 15.8 13.8 13.9  HCT 43.6 37.7* 38.6*  PLT 67* 50* 64*    Recent Labs Lab 01/19/14 1747 01/20/14 0130 01/21/14 1040  NA 136 135 136  K 3.2* 3.1* 4.0  CL 106 106 106  CO2 '22 23 24  ' GLUCOSE 128* 159* 298*  BUN '8 6 6  ' CREATININE 0.64 0.73 0.74   CALCIUM 9.1 8.2* 8.1*  ALBUMIN  --  3.5  --   - CRP 1.1 - Lactic Acid 0.84 - ESR 1 - A1C 7.2 - HIV nonreactive  Dg Foot Complete Right  01/19/2014   CLINICAL DATA:  Right foot pain without injury, initial encounter  EXAM: RIGHT FOOT COMPLETE - 3+ VIEW  COMPARISON:  11/07/2013  FINDINGS: 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 These changes are stable from the prior exam. No underlying bony erosion is seen. Diffuse osteopenia is noted. There is a are also changes of fusion within the tarsometatarsal articulation.  IMPRESSION: Postsurgical changes.  No acute abnormality is seen.   Electronically Signed   By: Inez Catalina M.D.   On: 01/19/2014 18:19   Results/Tests Pending at Time of Discharge: blood cultures  Discharge Medications:    Medication List    TAKE these medications        clindamycin 300 MG capsule  Commonly known as:  CLEOCIN  Take 1 capsule (300 mg total) by mouth 4 (four) times daily.     feeding supplement (GLUCERNA SHAKE) Liqd  Take 237 mLs by mouth 2 (two) times daily between meals.     feeding supplement (PRO-STAT SUGAR FREE 64) Liqd  Take 30 mLs by mouth daily at 3 pm.     glipiZIDE 5 MG tablet  Commonly known as:  GLUCOTROL  Take 1 tablet (5 mg total) by mouth 2 (two) times daily before a meal.     insulin glargine 100 UNIT/ML injection  Commonly known as:  LANTUS  Inject 0.22  mLs (22 Units total) into the skin at bedtime. For high blood sugar control     metFORMIN 500 MG 24 hr tablet  Commonly known as:  GLUCOPHAGE-XR  Take 1 tablet (500 mg total) by mouth 3 (three) times daily.     multivitamin with minerals Tabs tablet  Take 1 tablet by mouth daily. For for low vitamin     naproxen 500 MG tablet  Commonly known as:  NAPROSYN  Take 1 tablet (500 mg total) by mouth 2 (two) times daily with a meal.     omeprazole 20 MG capsule  Commonly known as:  PRILOSEC  Take 3 capsules (60 mg total) by mouth daily.     sucralfate 1 G  tablet  Commonly known as:  CARAFATE  Take 1 tablet (1 g total) by mouth 4 (four) times daily -  with meals and at bedtime.     triamcinolone cream 0.1 %  Commonly known as:  KENALOG  Apply 1 application topically 2 (two) times daily.        Discharge Instructions: Please refer to Patient Instructions section of EMR for full details.  Patient was counseled important signs and symptoms that should prompt return to medical care, changes in medications, dietary instructions, activity restrictions, and follow up appointments.   Follow-Up Appointments: Follow-up Information    Follow up with Lorna Few, DO On 01/31/2014.   Specialty:  Family Medicine   Why:  @ 3:00pm   Contact information:   [address] [place] [address] [phone]       Elberta Leatherwood, MD 01/21/2014, 2:17 PM PGY-1, [place]

## 2014-01-21 NOTE — Progress Notes (Signed)
CARE MANAGEMENT NOTE 01/21/2014  Patient:  Overton MamEREZ-NAVA,Johathon   Account Number:  [id number]  Date Initiated:  01/21/2014  Documentation initiated by:  [facility]rsie Savino  Subjective/Objective Assessment:   diabetic foot ulcer and cellulitis     Action/Plan:   Anticipated DC Date:  01/21/2014   Anticipated DC Plan:  HOME/SELF CARE  In-house referral  Interpreting Services      DC Planning Services  CM consult  MATCH Program      [facility]  [facility]   Choice offered to / List presented to:  [name]        HH arranged  HH-1 RN      [facility]  [facility]   Status of service:  Completed, signed off Medicare Important Message given?   (If response is "NO", the following Medicare IM given date fields will be blank) Date Medicare IM given:   Medicare IM given by:   Date Additional Medicare IM given:   Additional Medicare IM given by:    Discharge Disposition:  HOME W HOME HEALTH SERVICES  Per UR Regulation:  Reviewed for med. necessity/level of care/duration of stay  If discussed at Long Length of Stay Meetings, dates discussed:    Comments:  01/21/14 Gave patient Spanish MATCH letter,pharmacy list and a discount card, explained program via an interpreter. Contacted Miranda with [facility] and [facility].Jacquelynn CreeMary Ebonie Westerlund RN, BSN, CCM

## 2014-01-21 NOTE — Discharge Instructions (Signed)

## 2014-01-22 LAB — WOUND CULTURE

## 2014-01-23 ENCOUNTER — Telehealth: Payer: Self-pay | Admitting: Family Medicine

## 2014-01-23 NOTE — Telephone Encounter (Signed)
[facility] called to let us know that the patient didn't get admitted because he is still working.jw

## 2014-01-27 LAB — CULTURE, BLOOD (ROUTINE X 2)
CULTURE: NO GROWTH
Culture: NO GROWTH

## 2014-01-30 ENCOUNTER — Encounter (HOSPITAL_BASED_OUTPATIENT_CLINIC_OR_DEPARTMENT_OTHER): Payer: Self-pay | Attending: General Surgery

## 2014-01-30 DIAGNOSIS — L97519 Non-pressure chronic ulcer of other part of right foot with unspecified severity: Secondary | ICD-10-CM | POA: Insufficient documentation

## 2014-01-30 DIAGNOSIS — E11621 Type 2 diabetes mellitus with foot ulcer: Secondary | ICD-10-CM | POA: Insufficient documentation

## 2014-01-31 ENCOUNTER — Inpatient Hospital Stay: Payer: Self-pay | Admitting: Family Medicine

## 2014-02-07 ENCOUNTER — Encounter (HOSPITAL_COMMUNITY): Payer: Self-pay | Admitting: Orthopaedic Surgery

## 2014-02-18 ENCOUNTER — Ambulatory Visit (INDEPENDENT_AMBULATORY_CARE_PROVIDER_SITE_OTHER): Payer: Self-pay | Admitting: Family Medicine

## 2014-02-18 ENCOUNTER — Encounter: Payer: Self-pay | Admitting: Family Medicine

## 2014-02-18 VITALS — BP 128/79 | HR 74 | Temp 98.5°F | Resp 20 | Wt 182.0 lb

## 2014-02-18 DIAGNOSIS — L02419 Cutaneous abscess of limb, unspecified: Secondary | ICD-10-CM

## 2014-02-18 DIAGNOSIS — L03119 Cellulitis of unspecified part of limb: Secondary | ICD-10-CM

## 2014-02-18 DIAGNOSIS — E118 Type 2 diabetes mellitus with unspecified complications: Secondary | ICD-10-CM
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
## 2014-02-18 MED ORDER — DOXYCYCLINE HYCLATE 100 MG PO TABS: 100.0000 mg | ORAL_TABLET | Freq: Two times a day (BID) | ORAL | Status: DC

## 2014-02-18 MED ORDER — INSULIN GLARGINE 100 UNIT/ML ~~LOC~~ SOLN: 27.0000 [IU] | Freq: Every day | SUBCUTANEOUS | Status: DC

## 2014-02-18 MED ORDER — NAPROXEN 500 MG PO TABS: 500.0000 mg | ORAL_TABLET | Freq: Two times a day (BID) | ORAL | Status: DC

## 2014-02-18 NOTE — Assessment & Plan Note (Addendum)
Advised patient is to start at 27 units of Lantus this evening, increase by 1 unit daily if blood sugar remains above 110 fasting in the morning. Patient was able to teach back this method. - Fasting blood sugars every morning - Continue metformin and glipizide Need close follow-up well foot is healing, follow-up in 2 weeks

## 2014-02-18 NOTE — Assessment & Plan Note (Signed)
Today dressing has minimal drainage. Discussed with patient today that his doxycycline is supposed to be twice a day. Considering he has not been taking medications as directed, I advised him to finish his current bottle of doxycycline 1 tab-2 times a day, and I extended his prescription out for another 10 days. Patient is to follow with wound clinic closely
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
## 2014-02-18 NOTE — Patient Instructions (Signed)
I have called in refills for your Lantus, naproxen and doxycycline. Make certain to take your doxycycline 1 pill 2 times a day (see which equals every 12 hours) Make sure to take your fasting blood glucose in the morning, if above 110 when she to increase her Lantus dose by one unit that night. When she does start with 27 units of Lantus tonight. I will need to follow-up with you closely, make an appointment for 2 weeks.

## 2014-02-21 NOTE — Progress Notes (Signed)
[facility]  NAME:  Mitchell [name] [name], Mitchell [name]                 ACCOUNT NO.:  MEDICAL RECORD NO.:  [id number]      DATE OF BIRTH:  19-Oct-1975  PHYSICIAN:  Mitchell KannerErrol Nygeria Lager, MD        VISIT DATE:  02/20/2014                                  OFFICE VISIT   This pleasant 39 year old patient who is Spanish speaking, comes today with an interpreter.  He has had a right plantar foot callus with ulceration for several years and this has become worse now.  SUBJECTIVE:  The patient is extremely worried about a total contact cast which we were supposed to apply today.  The patient speaks through the interpreter and tells us that he has had a previous experience of the cast where he developed a bad infection and he was not happy to continue with this.  He says that the last time the infection was so bad that he had to go to the ER, have the cast cut open and then he was admitted to the hospital and he is extremely worried about getting this done again today.  Other than that, he has not significant problems.  OBJECTIVE:  On clinical examination, he has a callus which is significantly present around the ulcer which is fairly deep and undermining.  After discussing with him and obtaining an appropriate time out, he is agreeable to get this excised.  PROCEDURE NOTE:  The callus and skin is removed with scissors and forceps after an appropriate time out.  Ulcer is curetted and bleeding was brisk but controlled with pressure.  ASSESSMENT AND PLAN: 1. As he does not want a TCC, we are agreeable and will respect his     wishes. 2. We will apply collagen and offload with __________.  We will see     him back next week.  He is agreeable about this and quite relieved     that he is not going to have a total contact cast.          ______________________________ Mitchell KannerErrol Korri Ask, MD     EB/MEDQ  D:  02/20/2014  T:  02/21/2014  Job:  [id number]

## 2014-02-27 ENCOUNTER — Encounter (HOSPITAL_BASED_OUTPATIENT_CLINIC_OR_DEPARTMENT_OTHER): Payer: No Typology Code available for payment source | Attending: Surgery

## 2014-02-27 DIAGNOSIS — E11621 Type 2 diabetes mellitus with foot ulcer: Secondary | ICD-10-CM | POA: Insufficient documentation

## 2014-02-27 DIAGNOSIS — L97412 Non-pressure chronic ulcer of right heel and midfoot with fat layer exposed: Secondary | ICD-10-CM | POA: Insufficient documentation

## 2014-02-28 NOTE — H&P (Signed)
NAME:  Mitchell [name], Amish            ACCOUNT NO.:  [id number]  MEDICAL RECORD NO.:  [id number]  LOCATION:  FOOT                         FACILITY:  [facility]  PHYSICIAN:  Evlyn KannerErrol Saanvika Vazques, MD       DATE OF BIRTH:  1975/05/05  DATE OF ADMISSION:  02/27/2014 DATE OF DISCHARGE:                             HISTORY & PHYSICAL   HISTORY OF PRESENT ILLNESS:  This pleasant 39 year old Spanish speaking patient comes today with an interpreter and has come to review the ulcer on his right plantar foot with a callus which he has had for several years.  Subjectively, the patient is doing fine and says that he has had a good rest because he is off this week and has been able to rest his leg as much as possible.  He is offloading it at home well.  OBJECTIVE:  GENERAL:  He is awake and alert, doing well. VITAL SIGNS:  Stable and he is afebrile. EXTREMITIES:  On examination of the plantar aspect of his right foot, the callus has reformed and there is a very small opening which is only about 0.3 cm in diameter.  He needs sharp debridement and I have recommended this, and he is agreeable.  PROCEDURE NOTE:  Under an appropriate local anesthesia with application of Xylocaine 4%, an appropriate time-out done.  The callus, skin, and subcutaneous tissue were sharply debrided with a #15 blade and forceps. The ulcer was curetted out and there was brisk bleeding which was controlled with silver nitrate.  This was a subcutaneous debridement with skin, subcutaneous tissue, eschar, and callus removed.  An appropriate dressing was applied in place with collagen.  ASSESSMENT AND PLAN:  We will continue to apply collagen and offload his foot with a Darco shoe.  We will see him back next week, and I have emphasized that he should have his foot offloaded as much as possible this coming week as he is on vacation.  He is agreeable.          ______________________________ Evlyn KannerErrol Anis Cinelli, MD     EB/MEDQ  D:  02/27/2014   T:  02/28/2014  Job:  [id number]  cc:   [facility]

## 2014-03-04 ENCOUNTER — Ambulatory Visit (INDEPENDENT_AMBULATORY_CARE_PROVIDER_SITE_OTHER): Payer: No Typology Code available for payment source | Admitting: Family Medicine

## 2014-03-04 ENCOUNTER — Encounter: Payer: Self-pay | Admitting: Family Medicine

## 2014-03-04 VITALS — BP 132/85 | HR 78 | Temp 98.8°F | Ht 64.0 in | Wt 180.0 lb

## 2014-03-04 DIAGNOSIS — E118 Type 2 diabetes mellitus with unspecified complications: Secondary | ICD-10-CM

## 2014-03-04 NOTE — Progress Notes (Signed)
   Subjective:    Patient ID: Mitchell [name], male    DOB: 07/29/1975, 39 y.o.   MRN: [id number]  HPI  39-year-old male with DM-2 w/ current diabetic ulcer presents for follow up regarding DM-2.  Interpreter Darletta Mollorita Arias used during visit.  1) DM-2  CBG's - Fasting 120 - 150.  However this a.m. it was 282.  Medications -  Glipizide, metformin, Lantus 30 units QHS  Compliance - Yes.   Medication side effects  Hypoglycemia: no   Review of Systems Per HPI    Objective:   Physical Exam Filed Vitals:   03/04/14 1639  BP: 132/85  Pulse: 78  Temp: 98.8 F (37.1 C)  Exam: General: well appearing, NAD. Extremities:  Small circular diabetic wound noted on the plantar aspect of the right foot. No drainage or erythema noted. Amputation of 1st and 2nd toes noted. Significant scarring of the foot and ankle noted.    Assessment & Plan:  See Problem List

## 2014-03-04 NOTE — Assessment & Plan Note (Signed)
Last A1c in  December was 7.2. Per patient report , CBGs have been fairly well controlled with fastings in the 120s to 150s.   He did however, have a fasting CBG of 282 this morning. I advised the patient to increase his Lantus one unit daily until fasting CBG is 100 or less. Patient to follow-up closely with wound care.

## 2014-03-04 NOTE — Patient Instructions (Signed)
Fue agradable ver que en la actualidad.  Por favor asegrese de seguir muy de cerca con el cuidado de las heridas.  Aumenta tu Lantus en los niveles de Public librarianazcar en ayunas. Aumenta 1 unidad diaria hasta que su ayuno CBG es de 100 o menos.  Cudate  El Dr. Adriana [place],

## 2014-03-07 NOTE — Consult Note (Signed)
[name]:  Mitchell [name], Mitchell [name]            ACCOUNT NO.:  [id number]  MEDICAL RECORD NO.:  [id number]  LOCATION:  FOOT                         FACILITY:  [facility]  PHYSICIAN:  Evlyn KannerErrol Callie Bunyard, MD       DATE OF BIRTH:  [birth year]  DATE OF CONSULTATION:  03/06/2014 DATE OF DISCHARGE:                                CONSULTATION   This extremely pleasant 39 year old, Spanish speaking patient comes today without an interpreter and says he has been doing fine with no fresh issues.   SUBJECTIVE: He has been off work this entire week and has been resting a lot and has his leg elevated as much as possible.  OBJECTIVE:  Objectively, he is awake and alert, laying comfortably in bed.  Vital signs are stable.  He is afebrile.  He is 5 feet 4 inches, 128 pounds, and his blood pressure 100/77, pulse of 66.  On examination of the right lower extremity plantar aspect, he has a right plantar Wagner grade 2 ulceration of 0.6 x 0.5 x 0.3.  The maceration of the skin around this is much better and the ulcer base was clean.   PLAN/RECOMMENDATION: I have not recommended any debridement today and we will continue with the application of collagen and offloading his foot with a Darco shoe.  He will be back next week to see me, and he understands as long as he is on vacation he can continue to offload his leg as much as possible.  He is agreeable.          ______________________________ Evlyn KannerErrol Keyry Iracheta, MD     EB/MEDQ  D:  03/06/2014  T:  03/07/2014  Job:  [id number]  cc:   Wound Care Office

## 2014-03-14 NOTE — H&P (Signed)
Mitchell [name], March            ACCOUNT NO.:  [id number]  MEDICAL RECORD NO.:  [id number]  LOCATION:  FOOT                         FACILITY:  [facility]  PHYSICIAN:  Evlyn KannerErrol Ara Grandmaison, MD       DATE OF BIRTH:  December 21, 1975  DATE OF ADMISSION:  02/27/2014 DATE OF DISCHARGE:                             HISTORY & PHYSICAL   OFFICE VISIT:  This pleasant Spanish speaking patient comes with an interpreter today and says he has been doing fine.  He is back to work and has been on his feet quite a bit this weekend, especially because of the holidays. Other than that, he has no fresh issues.  OBJECTIVE:  He is awake and alert, laying comfortably in bed.  Vital signs are stable and he is afebrile.  He is 5 feet 4 inches, 128 pounds, pulse of 75, respiration of 20, and blood pressure 123/77.  His blood glucose was 162.  On examination of his lower extremity besides all the scarring, he has some callus reformation and there is undermining.  In view of this, he will need sharp debridement.  PROCEDURE:  After appropriate time-out and doing an application of lidocaine 5%, I have sharply debrided the over having callus and this has been nicely debrided down to subcutaneous tissue and there is active bleeding.  I have controlled the bleeding with silver nitrate.  Once the entire callus was removed, the size of his post debridement ulcer was 0.3 x 0.4 x 0.3.  Other than that, we will continue with application of collagen and offloading his foot with a Darco shoe.  He will come back to see us after 2 weeks due to some scheduling issues.          ______________________________ Evlyn KannerErrol Yuko Coventry, MD     EB/MEDQ  D:  03/13/2014  T:  03/14/2014  Job:  [id number]  cc:   Wound care office

## 2014-03-20 ENCOUNTER — Encounter (HOSPITAL_COMMUNITY): Payer: Self-pay | Admitting: Family Medicine

## 2014-03-20 ENCOUNTER — Emergency Department (HOSPITAL_COMMUNITY): Payer: Medicaid Other

## 2014-03-20 ENCOUNTER — Observation Stay (HOSPITAL_COMMUNITY)
Admission: EM | Admit: 2014-03-20 | Discharge: 2014-03-22 | Disposition: A | Discharge status: Home / Self Care | Payer: Medicaid Other | Attending: Family Medicine | Admitting: Family Medicine

## 2014-03-20 DIAGNOSIS — E118 Type 2 diabetes mellitus with unspecified complications: Secondary | ICD-10-CM | POA: Diagnosis present

## 2014-03-20 DIAGNOSIS — L02611 Cutaneous abscess of right foot: Principal | ICD-10-CM | POA: Insufficient documentation

## 2014-03-20 DIAGNOSIS — F329 Major depressive disorder, single episode, unspecified: Secondary | ICD-10-CM | POA: Insufficient documentation

## 2014-03-20 DIAGNOSIS — K7460 Unspecified cirrhosis of liver: Secondary | ICD-10-CM | POA: Insufficient documentation

## 2014-03-20 DIAGNOSIS — F1020 Alcohol dependence, uncomplicated: Secondary | ICD-10-CM | POA: Diagnosis present

## 2014-03-20 DIAGNOSIS — Z794 Long term (current) use of insulin: Secondary | ICD-10-CM | POA: Insufficient documentation

## 2014-03-20 DIAGNOSIS — K219 Gastro-esophageal reflux disease without esophagitis: Secondary | ICD-10-CM | POA: Insufficient documentation

## 2014-03-20 DIAGNOSIS — F1994 Other psychoactive substance use, unspecified with psychoactive substance-induced mood disorder: Secondary | ICD-10-CM | POA: Diagnosis present

## 2014-03-20 DIAGNOSIS — K7010 Alcoholic hepatitis without ascites: Secondary | ICD-10-CM | POA: Insufficient documentation

## 2014-03-20 DIAGNOSIS — E1165 Type 2 diabetes mellitus with hyperglycemia: Secondary | ICD-10-CM | POA: Insufficient documentation

## 2014-03-20 DIAGNOSIS — F39 Unspecified mood [affective] disorder: Secondary | ICD-10-CM | POA: Insufficient documentation

## 2014-03-20 LAB — BASIC METABOLIC PANEL
Anion gap: 9 (ref 5–15)
BUN: 10 mg/dL (ref 6–23)
CALCIUM: 9.7 mg/dL (ref 8.4–10.5)
CO2: 23 mmol/L (ref 19–32)
Chloride: 101 mmol/L (ref 96–112)
Creatinine, Ser: 0.91 mg/dL (ref 0.50–1.35)
GFR calc Af Amer: >90 mL/min (ref >90)
GFR calc non Af Amer: >90 mL/min (ref >90)
GLUCOSE: 390 mg/dL — AB (ref 70–99)
Potassium: 3.6 mmol/L (ref 3.5–5.1)
Sodium: 133 mmol/L — ABNORMAL LOW (ref 135–145)

## 2014-03-20 LAB — GLUCOSE, CAPILLARY: GLUCOSE-CAPILLARY: 151 mg/dL — AB (ref 70–99)

## 2014-03-20 LAB — CBC
HEMATOCRIT: 41.5 % (ref 39.0–52.0)
Hemoglobin: 15.2 g/dL (ref 13.0–17.0)
MCH: 31.6 pg (ref 26.0–34.0)
MCHC: 36.6 g/dL — AB (ref 30.0–36.0)
MCV: 86.3 fL (ref 78.0–100.0)
PLATELETS: 86 10*3/uL — AB (ref 150–400)
RBC: 4.81 MIL/uL (ref 4.22–5.81)
RDW: 12.8 % (ref 11.5–15.5)
WBC: 5.3 10*3/uL (ref 4.0–10.5)

## 2014-03-20 LAB — CBG MONITORING, ED
GLUCOSE-CAPILLARY: 249 mg/dL — AB (ref 70–99)
Glucose-Capillary: 369 mg/dL — ABNORMAL HIGH (ref 70–99)

## 2014-03-20 MED ORDER — HYDROMORPHONE HCL 1 MG/ML IJ SOLN: 1.0000 mg | INTRAMUSCULAR | Status: DC | PRN

## 2014-03-20 MED ORDER — ONDANSETRON HCL 4 MG/2ML IJ SOLN: 4.0000 mg | Freq: Three times a day (TID) | INTRAMUSCULAR | Status: DC | PRN

## 2014-03-20 MED ORDER — MORPHINE SULFATE 4 MG/ML IJ SOLN
4.0000 mg | Freq: Once | INTRAMUSCULAR | Status: AC
  Administered 2014-03-20: 4 mg via INTRAVENOUS
  Filled 2014-03-20: qty 1

## 2014-03-20 MED ORDER — SODIUM CHLORIDE 0.9 % IV BOLUS (SEPSIS)
1000.0000 mL | Freq: Once | INTRAVENOUS | Status: AC
  Administered 2014-03-20: 1000 mL via INTRAVENOUS

## 2014-03-20 MED ORDER — GADOBENATE DIMEGLUMINE 529 MG/ML IV SOLN
18.0000 mL | Freq: Once | INTRAVENOUS | Status: AC | PRN
  Administered 2014-03-20: 18 mL via INTRAVENOUS

## 2014-03-20 NOTE — ED Notes (Signed)
Pt requesting pain medication after returning from MRI. PA aware

## 2014-03-20 NOTE — ED Notes (Signed)
Family at bedside with meal; informed family and pt of NPO status

## 2014-03-20 NOTE — Progress Notes (Signed)
MRI of foot is concerning for abscess.  I have tentatively posted patient for surgery tomorrow at 11 am.  Will see patient in am for formal consult.  Hold abx if possible for intraop cultures.  Mayra ReelN. Michael Xu, MD [facility] [phone] 9:16 PM
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
## 2014-03-20 NOTE — ED Notes (Signed)
Pt here for wound infection to right foot. Sent here by the wound clinic. Pt is a diabetic.
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
## 2014-03-20 NOTE — ED Notes (Signed)
Pt given sandwich and graham crackers; pt NPO after midnight for surgery at 11am

## 2014-03-21 ENCOUNTER — Encounter (HOSPITAL_COMMUNITY)
Admission: EM | Disposition: A | Discharge status: Home / Self Care | Payer: Self-pay | Source: Home / Self Care | Attending: Emergency Medicine

## 2014-03-21 ENCOUNTER — Observation Stay (HOSPITAL_COMMUNITY): Payer: Medicaid Other | Admitting: Anesthesiology

## 2014-03-21 ENCOUNTER — Encounter (HOSPITAL_COMMUNITY): Payer: Self-pay | Admitting: *Deleted

## 2014-03-21 DIAGNOSIS — L02611 Cutaneous abscess of right foot: Secondary | ICD-10-CM | POA: Diagnosis not present

## 2014-03-21 DIAGNOSIS — E1165 Type 2 diabetes mellitus with hyperglycemia: Secondary | ICD-10-CM | POA: Insufficient documentation

## 2014-03-21 DIAGNOSIS — F329 Major depressive disorder, single episode, unspecified: Secondary | ICD-10-CM | POA: Diagnosis not present

## 2014-03-21 DIAGNOSIS — K219 Gastro-esophageal reflux disease without esophagitis: Secondary | ICD-10-CM | POA: Diagnosis not present

## 2014-03-21 HISTORY — PX: I&D EXTREMITY: SHX5045

## 2014-03-21 LAB — GLUCOSE, CAPILLARY
GLUCOSE-CAPILLARY: 398 mg/dL — AB (ref 70–99)
Glucose-Capillary: 169 mg/dL — ABNORMAL HIGH (ref 70–99)
Glucose-Capillary: 179 mg/dL — ABNORMAL HIGH (ref 70–99)
Glucose-Capillary: 201 mg/dL — ABNORMAL HIGH (ref 70–99)
Glucose-Capillary: 250 mg/dL — ABNORMAL HIGH (ref 70–99)
Glucose-Capillary: 251 mg/dL — ABNORMAL HIGH (ref 70–99)
Glucose-Capillary: 307 mg/dL — ABNORMAL HIGH (ref 70–99)

## 2014-03-21 LAB — C-REACTIVE PROTEIN

## 2014-03-21 LAB — PREALBUMIN: PREALBUMIN: 12.2 mg/dL — AB (ref 17.0–34.0)

## 2014-03-21 LAB — SURGICAL PCR SCREEN
MRSA, PCR: NEGATIVE
STAPHYLOCOCCUS AUREUS: POSITIVE — AB

## 2014-03-21 LAB — SEDIMENTATION RATE: SED RATE: 14 mm/h (ref 0–16)

## 2014-03-21 SURGERY — IRRIGATION AND DEBRIDEMENT EXTREMITY
Anesthesia: General | Site: Foot | Laterality: Right

## 2014-03-21 MED ORDER — PHENYLEPHRINE 40 MCG/ML (10ML) SYRINGE FOR IV PUSH (FOR BLOOD PRESSURE SUPPORT)
PREFILLED_SYRINGE | INTRAVENOUS | Status: AC
  Filled 2014-03-21: qty 10

## 2014-03-21 MED ORDER — MIDAZOLAM HCL 5 MG/5ML IJ SOLN
INTRAMUSCULAR | Status: DC | PRN
  Administered 2014-03-21: 2 mg via INTRAVENOUS

## 2014-03-21 MED ORDER — GLUCERNA SHAKE PO LIQD
237.0000 mL | Freq: Two times a day (BID) | ORAL | Status: DC
  Administered 2014-03-22: 237 mL via ORAL

## 2014-03-21 MED ORDER — PROMETHAZINE HCL 25 MG/ML IJ SOLN: 6.2500 mg | INTRAMUSCULAR | Status: DC | PRN

## 2014-03-21 MED ORDER — LACTATED RINGERS IV SOLN
INTRAVENOUS | Status: DC | PRN
  Administered 2014-03-21: 12:00:00 via INTRAVENOUS

## 2014-03-21 MED ORDER — ONDANSETRON HCL 4 MG/2ML IJ SOLN: 4.0000 mg | Freq: Three times a day (TID) | INTRAMUSCULAR | Status: DC | PRN

## 2014-03-21 MED ORDER — VITAMIN B-1 100 MG PO TABS
100.0000 mg | ORAL_TABLET | Freq: Every day | ORAL | Status: DC
  Administered 2014-03-22: 100 mg via ORAL
  Filled 2014-03-21 (×2): qty 1

## 2014-03-21 MED ORDER — MIDAZOLAM HCL 2 MG/2ML IJ SOLN
INTRAMUSCULAR | Status: AC
  Filled 2014-03-21: qty 2

## 2014-03-21 MED ORDER — HYDROMORPHONE HCL 1 MG/ML IJ SOLN: 0.5000 mg | INTRAMUSCULAR | Status: DC | PRN

## 2014-03-21 MED ORDER — ADULT MULTIVITAMIN W/MINERALS CH
1.0000 | ORAL_TABLET | Freq: Every day | ORAL | Status: DC
  Administered 2014-03-22: 1 via ORAL
  Filled 2014-03-21 (×2): qty 1

## 2014-03-21 MED ORDER — INSULIN ASPART 100 UNIT/ML ~~LOC~~ SOLN
0.0000 [IU] | SUBCUTANEOUS | Status: DC
  Administered 2014-03-21: 5 [IU] via SUBCUTANEOUS
  Administered 2014-03-21: 11 [IU] via SUBCUTANEOUS
  Administered 2014-03-21: 15 [IU] via SUBCUTANEOUS
  Administered 2014-03-21: 5 [IU] via SUBCUTANEOUS
  Administered 2014-03-21 – 2014-03-22 (×3): 8 [IU] via SUBCUTANEOUS
  Administered 2014-03-22: 5 [IU] via SUBCUTANEOUS

## 2014-03-21 MED ORDER — ONDANSETRON HCL 4 MG/2ML IJ SOLN
INTRAMUSCULAR | Status: AC
  Filled 2014-03-21: qty 2

## 2014-03-21 MED ORDER — HYDROMORPHONE HCL 1 MG/ML IJ SOLN
0.2500 mg | INTRAMUSCULAR | Status: DC | PRN
  Administered 2014-03-21 (×2): 0.5 mg via INTRAVENOUS

## 2014-03-21 MED ORDER — FENTANYL CITRATE 0.05 MG/ML IJ SOLN
INTRAMUSCULAR | Status: AC
  Filled 2014-03-21: qty 5

## 2014-03-21 MED ORDER — VANCOMYCIN HCL IN DEXTROSE 1-5 GM/200ML-% IV SOLN
INTRAVENOUS | Status: AC
  Filled 2014-03-21: qty 200

## 2014-03-21 MED ORDER — HYDROMORPHONE HCL 1 MG/ML IJ SOLN
1.0000 mg | INTRAMUSCULAR | Status: DC | PRN
  Administered 2014-03-21 – 2014-03-22 (×4): 1 mg via INTRAVENOUS
  Filled 2014-03-21 (×5): qty 1

## 2014-03-21 MED ORDER — PROPOFOL 10 MG/ML IV BOLUS
INTRAVENOUS | Status: AC
  Filled 2014-03-21: qty 20

## 2014-03-21 MED ORDER — ONDANSETRON HCL 4 MG/2ML IJ SOLN
INTRAMUSCULAR | Status: DC | PRN
  Administered 2014-03-21: 4 mg via INTRAVENOUS

## 2014-03-21 MED ORDER — SILVER SULFADIAZINE 1 % EX CREA
TOPICAL_CREAM | Freq: Once | CUTANEOUS | Status: AC
  Administered 2014-03-21: 1 via TOPICAL
  Filled 2014-03-21: qty 85

## 2014-03-21 MED ORDER — SODIUM CHLORIDE 0.9 % IR SOLN
Status: DC | PRN
  Administered 2014-03-21: 1000 mL

## 2014-03-21 MED ORDER — LIDOCAINE HCL (CARDIAC) 20 MG/ML IV SOLN
INTRAVENOUS | Status: AC
  Filled 2014-03-21: qty 5

## 2014-03-21 MED ORDER — HYDROMORPHONE HCL 1 MG/ML IJ SOLN
1.0000 mg | Freq: Once | INTRAMUSCULAR | Status: AC
  Administered 2014-03-21: 1 mg via INTRAVENOUS

## 2014-03-21 MED ORDER — OXYCODONE HCL 5 MG/5ML PO SOLN: 5.0000 mg | Freq: Once | ORAL | Status: DC | PRN

## 2014-03-21 MED ORDER — LACTATED RINGERS IV SOLN
INTRAVENOUS | Status: DC
  Administered 2014-03-21: 10:00:00 via INTRAVENOUS

## 2014-03-21 MED ORDER — PROPOFOL 10 MG/ML IV BOLUS
INTRAVENOUS | Status: DC | PRN
  Administered 2014-03-21: 150 mg via INTRAVENOUS

## 2014-03-21 MED ORDER — INSULIN GLARGINE 100 UNIT/ML ~~LOC~~ SOLN
15.0000 [IU] | Freq: Every day | SUBCUTANEOUS | Status: DC
  Administered 2014-03-21 (×2): 15 [IU] via SUBCUTANEOUS
  Filled 2014-03-21 (×5): qty 0.15

## 2014-03-21 MED ORDER — HYDROMORPHONE HCL 1 MG/ML IJ SOLN
1.0000 mg | INTRAMUSCULAR | Status: DC | PRN
  Administered 2014-03-21: 1 mg via INTRAVENOUS
  Filled 2014-03-21 (×2): qty 1

## 2014-03-21 MED ORDER — HYDROMORPHONE HCL 1 MG/ML IJ SOLN: 1.0000 mg | INTRAMUSCULAR | Status: DC | PRN

## 2014-03-21 MED ORDER — SILVER SULFADIAZINE 1 % EX CREA
TOPICAL_CREAM | Freq: Two times a day (BID) | CUTANEOUS | Status: DC
  Administered 2014-03-22: 11:00:00 via TOPICAL
  Filled 2014-03-21: qty 85

## 2014-03-21 MED ORDER — VANCOMYCIN HCL IN DEXTROSE 1-5 GM/200ML-% IV SOLN
1000.0000 mg | Freq: Three times a day (TID) | INTRAVENOUS | Status: DC
  Administered 2014-03-21 – 2014-03-22 (×3): 1000 mg via INTRAVENOUS
  Filled 2014-03-21 (×7): qty 200

## 2014-03-21 MED ORDER — ENSURE COMPLETE PO LIQD
237.0000 mL | Freq: Two times a day (BID) | ORAL | Status: DC
  Administered 2014-03-21: 237 mL via ORAL

## 2014-03-21 MED ORDER — PRO-STAT SUGAR FREE PO LIQD
30.0000 mL | Freq: Two times a day (BID) | ORAL | Status: DC
  Administered 2014-03-21 – 2014-03-22 (×2): 30 mL via ORAL
  Filled 2014-03-21 (×3): qty 30

## 2014-03-21 MED ORDER — FENTANYL CITRATE 0.05 MG/ML IJ SOLN
INTRAMUSCULAR | Status: DC | PRN
  Administered 2014-03-21 (×3): 50 ug via INTRAVENOUS

## 2014-03-21 MED ORDER — FOLIC ACID 1 MG PO TABS
1.0000 mg | ORAL_TABLET | Freq: Every day | ORAL | Status: DC
  Administered 2014-03-22: 1 mg via ORAL
  Filled 2014-03-21 (×2): qty 1

## 2014-03-21 MED ORDER — HYDROMORPHONE HCL 1 MG/ML IJ SOLN
INTRAMUSCULAR | Status: AC
  Filled 2014-03-21: qty 1

## 2014-03-21 MED ORDER — HYDROMORPHONE HCL 1 MG/ML IJ SOLN
0.5000 mg | INTRAMUSCULAR | Status: DC | PRN
Start: 2014-03-21 — End: 2014-03-21
  Administered 2014-03-21: 0.5 mg via INTRAVENOUS
  Filled 2014-03-21: qty 1

## 2014-03-21 MED ORDER — OXYCODONE HCL 5 MG PO TABS: 5.0000 mg | ORAL_TABLET | Freq: Once | ORAL | Status: DC | PRN

## 2014-03-21 MED ORDER — VANCOMYCIN HCL 1000 MG IV SOLR
1000.0000 mg | INTRAVENOUS | Status: DC | PRN
  Administered 2014-03-21: 1000 mg via INTRAVENOUS

## 2014-03-21 MED ORDER — LORAZEPAM 1 MG PO TABS: 1.0000 mg | ORAL_TABLET | Freq: Four times a day (QID) | ORAL | Status: DC | PRN

## 2014-03-21 MED ORDER — THIAMINE HCL 100 MG/ML IJ SOLN
100.0000 mg | Freq: Every day | INTRAMUSCULAR | Status: DC
  Filled 2014-03-21 (×2): qty 1

## 2014-03-21 MED ORDER — LORAZEPAM 2 MG/ML IJ SOLN: 1.0000 mg | Freq: Four times a day (QID) | INTRAMUSCULAR | Status: DC | PRN

## 2014-03-21 MED ORDER — LIDOCAINE HCL (CARDIAC) 10 MG/ML IV SOLN
INTRAVENOUS | Status: DC | PRN
  Administered 2014-03-21: 60 mg via INTRAVENOUS

## 2014-03-21 SURGICAL SUPPLY — 71 items
BANDAGE ELASTIC 3 VELCRO ST LF (GAUZE/BANDAGES/DRESSINGS) IMPLANT
BANDAGE GAUZE 4  KLING STR (GAUZE/BANDAGES/DRESSINGS) ×3 IMPLANT
BLADE SURG 10 STRL SS (BLADE) ×3 IMPLANT
BNDG COHESIVE 1X5 TAN STRL LF (GAUZE/BANDAGES/DRESSINGS) IMPLANT
BNDG COHESIVE 4X5 TAN STRL (GAUZE/BANDAGES/DRESSINGS) ×3 IMPLANT
BNDG COHESIVE 6X5 TAN STRL LF (GAUZE/BANDAGES/DRESSINGS) IMPLANT
BNDG CONFORM 3 STRL LF (GAUZE/BANDAGES/DRESSINGS) ×3 IMPLANT
BNDG GAUZE STRTCH 6 (GAUZE/BANDAGES/DRESSINGS) IMPLANT
CORDS BIPOLAR (ELECTRODE) IMPLANT
COVER SURGICAL LIGHT HANDLE (MISCELLANEOUS) ×3 IMPLANT
CUFF TOURNIQUET SINGLE 24IN (TOURNIQUET CUFF) IMPLANT
CUFF TOURNIQUET SINGLE 34IN LL (TOURNIQUET CUFF) IMPLANT
CUFF TOURNIQUET SINGLE 44IN (TOURNIQUET CUFF) IMPLANT
DRAPE EXTREMITY BILATERAL (DRAPE) IMPLANT
DRAPE IMP U-DRAPE 54X76 (DRAPES) IMPLANT
DRAPE INCISE IOBAN 66X45 STRL (DRAPES) IMPLANT
DRAPE SURG 17X23 STRL (DRAPES) IMPLANT
DRAPE U-SHAPE 47X51 STRL (DRAPES) ×3 IMPLANT
DURAPREP 26ML APPLICATOR (WOUND CARE) ×6 IMPLANT
ELECT CAUTERY BLADE 6.4 (BLADE) ×3 IMPLANT
ELECT REM PT RETURN 9FT ADLT (ELECTROSURGICAL)
ELECTRODE REM PT RTRN 9FT ADLT (ELECTROSURGICAL) IMPLANT
FACESHIELD STD STERILE (MASK) ×3 IMPLANT
FACESHIELD WRAPAROUND (MASK) IMPLANT
GAUZE SPONGE 4X4 12PLY STRL (GAUZE/BANDAGES/DRESSINGS) ×3 IMPLANT
GAUZE XEROFORM 1X8 LF (GAUZE/BANDAGES/DRESSINGS) IMPLANT
GAUZE XEROFORM 5X9 LF (GAUZE/BANDAGES/DRESSINGS) IMPLANT
GLOVE BIO SURGEON STRL SZ7.5 (GLOVE) ×3 IMPLANT
GLOVE BIOGEL PI IND STRL 6.5 (GLOVE) ×1 IMPLANT
GLOVE BIOGEL PI IND STRL 7.5 (GLOVE) ×1 IMPLANT
GLOVE BIOGEL PI INDICATOR 6.5 (GLOVE) ×2
GLOVE BIOGEL PI INDICATOR 7.5 (GLOVE) ×2
GLOVE NEODERM STRL 7.5 LF PF (GLOVE) ×2 IMPLANT
GLOVE SURG NEODERM 7.5  LF PF (GLOVE) ×4
GLOVE SURG SS PI 7.5 STRL IVOR (GLOVE) ×3 IMPLANT
GLOVE SURG SYN 7.5  E (GLOVE) ×4
GLOVE SURG SYN 7.5 E (GLOVE) ×2 IMPLANT
GOWN STRL REIN XL XLG (GOWN DISPOSABLE) ×3 IMPLANT
HANDPIECE INTERPULSE COAX TIP (DISPOSABLE)
KIT BASIN OR (CUSTOM PROCEDURE TRAY) ×3 IMPLANT
KIT ROOM TURNOVER OR (KITS) ×3 IMPLANT
MANIFOLD NEPTUNE II (INSTRUMENTS) ×3 IMPLANT
NS IRRIG 1000ML POUR BTL (IV SOLUTION) ×3 IMPLANT
PACK ORTHO EXTREMITY (CUSTOM PROCEDURE TRAY) ×3 IMPLANT
PAD ABD 8X10 STRL (GAUZE/BANDAGES/DRESSINGS) IMPLANT
PAD ARMBOARD 7.5X6 YLW CONV (MISCELLANEOUS) ×6 IMPLANT
PADDING CAST ABS 4INX4YD NS (CAST SUPPLIES)
PADDING CAST ABS COTTON 4X4 ST (CAST SUPPLIES) IMPLANT
PADDING CAST COTTON 6X4 STRL (CAST SUPPLIES) IMPLANT
SET CYSTO W/LG BORE CLAMP LF (SET/KITS/TRAYS/PACK) ×3 IMPLANT
SET HNDPC FAN SPRY TIP SCT (DISPOSABLE) IMPLANT
SPONGE GAUZE 4X4 12PLY STER LF (GAUZE/BANDAGES/DRESSINGS) ×3 IMPLANT
SPONGE LAP 18X18 X RAY DECT (DISPOSABLE) ×6 IMPLANT
STOCKINETTE IMPERVIOUS 9X36 MD (GAUZE/BANDAGES/DRESSINGS) ×3 IMPLANT
SUT ETHILON 2 0 FS 18 (SUTURE) IMPLANT
SUT ETHILON 2 0 PSLX (SUTURE) IMPLANT
SUT ETHILON 3 0 PS 1 (SUTURE) IMPLANT
SUT VIC AB 2-0 CT1 36 (SUTURE) IMPLANT
SUT VIC AB 2-0 FS1 27 (SUTURE) IMPLANT
SWAB COLLECTION DEVICE MRSA (MISCELLANEOUS) ×6 IMPLANT
SYR CONTROL 10ML LL (SYRINGE) IMPLANT
TOWEL OR 17X24 6PK STRL BLUE (TOWEL DISPOSABLE) ×3 IMPLANT
TOWEL OR 17X26 10 PK STRL BLUE (TOWEL DISPOSABLE) ×3 IMPLANT
TUBE ANAEROBIC SPECIMEN COL (MISCELLANEOUS) IMPLANT
TUBE CONNECTING 12'X1/4 (SUCTIONS) ×1
TUBE CONNECTING 12X1/4 (SUCTIONS) ×2 IMPLANT
TUBE FEEDING 5FR 15 INCH (TUBING) IMPLANT
TUBING CYSTO DISP (UROLOGICAL SUPPLIES) ×3 IMPLANT
UNDERPAD 30X30 INCONTINENT (UNDERPADS AND DIAPERS) ×3 IMPLANT
WATER STERILE IRR 1000ML POUR (IV SOLUTION) ×3 IMPLANT
YANKAUER SUCT BULB TIP NO VENT (SUCTIONS) ×3 IMPLANT

## 2014-03-21 NOTE — Transfer of Care (Signed)
Immediate Anesthesia Transfer of Care Note  Patient: Mitchell [name]  Procedure(s) Performed: Procedure(s): IRRIGATION AND DEBRIDEMENT RIGHT FOOT abscess (Right)  Patient Location: PACU  Anesthesia Type:General  Level of Consciousness: awake, alert , oriented and patient cooperative  Airway & Oxygen Therapy: Patient Spontanous Breathing and Patient connected to nasal cannula oxygen  Post-op Assessment: Report given to RN, Post -op Vital signs reviewed and stable and Patient moving all extremities  Post vital signs: Reviewed and stable  Last Vitals:  Filed Vitals:   03/21/14 0601  BP: 119/73  Pulse: 93  Temp: 37.1 C  Resp: 20    Complications: No apparent anesthesia complications

## 2014-03-21 NOTE — Anesthesia Preprocedure Evaluation (Addendum)
Anesthesia Evaluation  Patient identified by MRN, date of birth, ID band Patient awake    Reviewed: Allergy & Precautions, NPO status , Patient's Chart, lab work & pertinent test results  History of Anesthesia Complications Negative for: history of anesthetic complications  Airway Mallampati: II  TM Distance: >3 FB Neck ROM: Full    Dental  (+) Poor Dentition, Dental Advisory Given   Pulmonary neg pulmonary ROS,    Pulmonary exam normal       Cardiovascular negative cardio ROS      Neuro/Psych PSYCHIATRIC DISORDERS Depression negative neurological ROS     GI/Hepatic GERD-  ,(+)     substance abuse  alcohol use, Hepatitis -  Endo/Other  diabetes  Renal/GU      Musculoskeletal   Abdominal   Peds  Hematology   Anesthesia Other Findings   Reproductive/Obstetrics                            Anesthesia Physical Anesthesia Plan  ASA: III  Anesthesia Plan: General   Post-op Pain Management:    Induction: Intravenous  Airway Management Planned: LMA  Additional Equipment:   Intra-op Plan:   Post-operative Plan: Extubation in OR  Informed Consent: I have reviewed the patients History and Physical, chart, labs and discussed the procedure including the risks, benefits and alternatives for the proposed anesthesia with the patient or authorized representative who has indicated his/her understanding and acceptance.   Dental advisory given  Plan Discussed with: CRNA, Anesthesiologist and Surgeon  Anesthesia Plan Comments: (Interpreter was used for the interview)       Anesthesia Quick Evaluation

## 2014-03-21 NOTE — Progress Notes (Signed)
Report called to OR  
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
## 2014-03-21 NOTE — Progress Notes (Signed)
Consent obtained using pacific interpreters.
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
## 2014-03-21 NOTE — Progress Notes (Signed)
UR completed 

## 2014-03-21 NOTE — Progress Notes (Signed)
Brief Nutrition Note  RD received consult for wound healing. Pt has been NPO and in OR for surgery for I&D of rt foot abscess. Will follow-up tomorrow for full assessment.   Nechama Escutia A. Mayford KnifeWilliams, RD, LDN, CDE Pager: [phone] After hours Pager: [phone]

## 2014-03-21 NOTE — Consult Note (Signed)
WOC will follow along at this point, review of record patient tentative for orthopedic surgery today possible debridement at 11AM.  Will follow up later in the day should surgery not be indicated and ortho does not address wound care needs.   Cecelia Graciano [place] RN,CWOCN [phone]

## 2014-03-21 NOTE — CV Procedure (Signed)
Date of surgery: 03/21/2014  Preoperative diagnosis: Right foot abscess  Postoperative diagnosis: Same  Procedure: 1. Incision and drainage of right foot abscess 2. Closure by secondary intention of surgical wound of right foot  Surgeon: Roda ShuttersXu, M.D.  Anesthesia: Gen.  Estimated blood loss: Minimal  Specimens: 2 intraoperative cultures  Indications for procedure: The patient is a 39 year old gentleman who presents with a right foot abscess found on MRI. The risks, benefits, and alternatives to surgery were discussed with the patient through an interpreter and he wished to proceed. Next  Description of procedure: The patient was identified in the preoperative holding area. The operative site was marked by the surgeon confirmed with the patient. Brought back to the operating room. His placed supine on the table. General anesthesia was induced. Preoperative antibiotics were held in anticipation for intraoperative cultures were given once the cultures were obtained. A timeout for was performed. Right lower extremity was prepped and draped in standard sterile fashion. Incision of the abscess on the forefoot was performed. There was frank pus that was expelled from the area. 2 cultures were taken. Antibiotics were given. Sharp excisional debridement of the skin overlying the abscess was performed using Metzenbaum scissors. The wound was then thoroughly irrigated with normal saline. Silvadene cream was placed on the wound and our intention is to allow this to heal by secondary intention. The patient tolerated the procedure well and was expanded and transferred to the PACU in stable condition.  Postoperative plan: The patient will be nonweightbearing to the right forefoot. He will need Silvadene dressing changes twice a day. He will need 2 weeks of antibiotics based on his intraoperative cultures. I will see him back in the office in 1 week for wound check.  Mayra ReelN. Michael Xu, MD [facility] [phone] 12:35 PM

## 2014-03-21 NOTE — Anesthesia Postprocedure Evaluation (Signed)
Anesthesia Post Note  Patient: Mitchell [name]  Procedure(s) Performed: Procedure(s) (LRB): IRRIGATION AND DEBRIDEMENT RIGHT FOOT abscess (Right)  Anesthesia type: general  Patient location: PACU  Post pain: Pain level controlled  Post assessment: Patient's Cardiovascular Status Stable  Last Vitals:  Filed Vitals:   03/21/14 1345  BP: 131/74  Pulse: 88  Temp:   Resp: 14    Post vital signs: Reviewed and stable  Level of consciousness: sedated  Complications: No apparent anesthesia complications
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
## 2014-03-22 ENCOUNTER — Telehealth: Payer: Self-pay | Admitting: Family Medicine

## 2014-03-22 ENCOUNTER — Encounter (HOSPITAL_COMMUNITY): Payer: Self-pay | Admitting: Orthopaedic Surgery

## 2014-03-22 DIAGNOSIS — E118 Type 2 diabetes mellitus with unspecified complications: Secondary | ICD-10-CM

## 2014-03-22 LAB — CBC WITH DIFFERENTIAL/PLATELET
BASOS ABS: 0 10*3/uL (ref 0.0–0.1)
Basophils Relative: 0 % (ref 0–1)
EOS ABS: 0.1 10*3/uL (ref 0.0–0.7)
EOS PCT: 2 % (ref 0–5)
HCT: 38.5 % — ABNORMAL LOW (ref 39.0–52.0)
HEMOGLOBIN: 13.9 g/dL (ref 13.0–17.0)
LYMPHS ABS: 1 10*3/uL (ref 0.7–4.0)
Lymphocytes Relative: 23 % (ref 12–46)
MCH: 32.2 pg (ref 26.0–34.0)
MCHC: 36.1 g/dL — AB (ref 30.0–36.0)
MCV: 89.1 fL (ref 78.0–100.0)
MONO ABS: 0.5 10*3/uL (ref 0.1–1.0)
Monocytes Relative: 11 % (ref 3–12)
NEUTROS PCT: 64 % (ref 43–77)
Neutro Abs: 2.7 10*3/uL (ref 1.7–7.7)
Platelets: 69 10*3/uL — ABNORMAL LOW (ref 150–400)
RBC: 4.32 MIL/uL (ref 4.22–5.81)
RDW: 12.8 % (ref 11.5–15.5)
WBC: 4.2 10*3/uL (ref 4.0–10.5)

## 2014-03-22 LAB — BASIC METABOLIC PANEL
Anion gap: 5 (ref 5–15)
BUN: 7 mg/dL (ref 6–23)
CALCIUM: 8 mg/dL — AB (ref 8.4–10.5)
CO2: 30 mmol/L (ref 19–32)
CREATININE: 0.94 mg/dL (ref 0.50–1.35)
Chloride: 97 mmol/L (ref 96–112)
GFR calc non Af Amer: >90 mL/min (ref >90)
Glucose, Bld: 296 mg/dL — ABNORMAL HIGH (ref 70–99)
Potassium: 3.6 mmol/L (ref 3.5–5.1)
Sodium: 132 mmol/L — ABNORMAL LOW (ref 135–145)

## 2014-03-22 LAB — GLUCOSE, CAPILLARY
GLUCOSE-CAPILLARY: 150 mg/dL — AB (ref 70–99)
Glucose-Capillary: 209 mg/dL — ABNORMAL HIGH (ref 70–99)
Glucose-Capillary: 255 mg/dL — ABNORMAL HIGH (ref 70–99)
Glucose-Capillary: 252 mg/dL — ABNORMAL HIGH (ref 70–99)

## 2014-03-22 MED ORDER — CLINDAMYCIN HCL 300 MG PO CAPS
450.0000 mg | ORAL_CAPSULE | Freq: Four times a day (QID) | ORAL | Status: DC
  Filled 2014-03-22 (×3): qty 1

## 2014-03-22 MED ORDER — OXYCODONE-ACETAMINOPHEN 5-325 MG PO TABS: 1.0000 | ORAL_TABLET | Freq: Four times a day (QID) | ORAL | Status: DC | PRN

## 2014-03-22 MED ORDER — HYDROMORPHONE HCL 1 MG/ML IJ SOLN: 1.0000 mg | INTRAMUSCULAR | Status: DC | PRN

## 2014-03-22 MED ORDER — HYDROMORPHONE HCL 1 MG/ML IJ SOLN
1.0000 mg | Freq: Once | INTRAMUSCULAR | Status: AC
  Administered 2014-03-22: 1 mg via INTRAVENOUS

## 2014-03-22 MED ORDER — SILVER SULFADIAZINE 1 % EX CREA: TOPICAL_CREAM | Freq: Two times a day (BID) | CUTANEOUS | Status: DC

## 2014-03-22 MED ORDER — CLINDAMYCIN HCL 150 MG PO CAPS: 450.0000 mg | ORAL_CAPSULE | Freq: Four times a day (QID) | ORAL | Status: DC

## 2014-03-22 MED ORDER — PRO-STAT SUGAR FREE PO LIQD: 30.0000 mL | Freq: Every day | ORAL | Status: DC

## 2014-03-22 MED ORDER — HYDROMORPHONE HCL 1 MG/ML IJ SOLN
1.0000 mg | INTRAMUSCULAR | Status: DC
  Administered 2014-03-22 (×2): 1 mg via INTRAVENOUS
  Filled 2014-03-22 (×2): qty 1

## 2014-03-22 NOTE — Progress Notes (Signed)
Mitchell [name] to be D/C'd Home per MD order.  Discussed with the patient in spanish with interpreter and all questions fully answered.    Medication List    STOP taking these medications        doxycycline 100 MG tablet  Commonly known as:  VIBRA-TABS      TAKE these medications        clindamycin 150 MG capsule  Commonly known as:  CLEOCIN  Take 3 capsules (450 mg total) by mouth every 6 (six) hours.     feeding supplement (GLUCERNA SHAKE) Liqd  Take 237 mLs by mouth 2 (two) times daily between meals.     feeding supplement (PRO-STAT SUGAR FREE 64) Liqd  Take 30 mLs by mouth daily at 3 pm.     glipiZIDE 5 MG tablet  Commonly known as:  GLUCOTROL  Take 1 tablet (5 mg total) by mouth 2 (two) times daily before a meal.     insulin glargine 100 UNIT/ML injection  Commonly known as:  LANTUS  Inject 0.27 mLs (27 Units total) into the skin at bedtime. For high blood sugar control     metFORMIN 500 MG 24 hr tablet  Commonly known as:  GLUCOPHAGE-XR  Take 1 tablet (500 mg total) by mouth 3 (three) times daily.     multivitamin with minerals Tabs tablet  Take 1 tablet by mouth daily. For for low vitamin     naproxen 500 MG tablet  Commonly known as:  NAPROSYN  Take 1 tablet (500 mg total) by mouth 2 (two) times daily with a meal.     omeprazole 20 MG capsule  Commonly known as:  PRILOSEC  Take 3 capsules (60 mg total) by mouth daily.     oxyCODONE-acetaminophen 5-325 MG per tablet  Commonly known as:  ROXICET  Take 1-2 tablets by mouth every 6 (six) hours as needed for severe pain.     silver sulfADIAZINE 1 % cream  Commonly known as:  SILVADENE  Apply topically 2 (two) times daily.        VVS. Dressing changed to right foot this am.  IV catheter discontinued intact. Site without signs and symptoms of complications. Dressing and pressure applied.  An After Visit Summary was printed and given to the patient.  D/c education completed with patient/family including  follow up instructions, medication list, d/c activities limitations if indicated, with other d/c instructions as indicated by MD - patient able to verbalize understanding, all questions fully answered.   Patient instructed to return to ED, call 911, or call MD for any changes in condition.   Patient escorted via WC, and D/C home via private auto.  [name] D 03/22/2014 5:32 PM

## 2014-03-22 NOTE — Progress Notes (Signed)
Interpreter Wyvonnia DuskyGraciela Namihira for [employer]ora Tylor. Care management

## 2014-03-22 NOTE — Progress Notes (Signed)
UR completed 

## 2014-03-22 NOTE — Progress Notes (Addendum)
Was paged by the case manager, Gavin Poundeborah, regarding the patient's insulin. Translator was in the room with patient. Patient cannot afford Lantus insulin. [facility] to go to the health department for medications.  Recommend changing the insulin to Novolin 70/30 insulin 15 units BID (for discharge) which would be close to the dosage of Lantus 19-20 units daily. Novolin 70/30  insulin is [facility] brand and cost $26 per bottle.   Will continue to follow while in hospital. Smith MinceKendra Jerlisa Diliberto RN BSN CDE

## 2014-03-22 NOTE — Telephone Encounter (Signed)
Pt would like to [facility] pharmacies [facility] to [facility]. He cannot afford his meds.

## 2014-03-22 NOTE — Clinical Social Work Note (Signed)
CSW received consult for medication needs. CSW has made RNCM aware.  Roddie McBryant Treyvin Glidden MSW, [place] Creek[place]CSWA, [place], [phone]

## 2014-03-22 NOTE — Discharge Instructions (Signed)
Absceso (Abscess)  Un absceso es una zona infectada que contiene pus y desechos.Puede aparecer en cualquier parte del cuerpo. Tambin se lo conoce como fornculo o divieso. CAUSAS  Ocurre cuando los tejidos se infectan. Tambin puede formarse por obstruccin de las glndulas sebceas o las glndulas sudorparas, infeccin de los folculos pilosos o por una lesin pequea en la piel. A medida que el organismo lucha contra la infeccin, se acumula pus en la zona y hace presin debajo de la piel. Esta presin causa dolor. Las personas con un sistema inmunolgico debilitado tienen dificultad para Industrial/product designerluchar contra las infecciones y pueden formar abscesos con ms frecuencia.  SNTOMAS  Generalmente un absceso se forma sobre la piel y se vuelve una masa dolorosa, roja, caliente y sensible. Si se forma debajo de la piel, podr sentir como una zona blanda, que se Zavallamueve, debajo de la piel. Algunos abscesos se abren (ruptura) por s mismos, pero la mayora seguir empeorando si no se lo trata. La infeccin puede diseminarse hacia otros sitios del cuerpo y finalmente al torrente sanguneo y hace que el enfermo se sienta mal.  DIAGNSTICO  El mdico le har una historia clnica y un examen fsico. Podrn tomarle Lauris Poaguna muestra de lquido del absceso y Public librariananalizarlo para Clinical research associateencontrar la causa de la infeccin. .  TRATAMIENTO  El mdico le indicar antibiticos para combatir la infeccin. Sin embargo, el uso de antibiticos solamente no curar el absceso. El mdico tendr que hacer un pequeo corte (incisin) en el absceso para drenar el pus. En algunos casos se introduce una gasa en el absceso para reducir Chief Technology Officerel dolor y que siga drenando la zona.  INSTRUCCIONES PARA EL CUIDADO EN EL HOGAR   Solo tome medicamentos de venta libre o recetados para Chief Technology Officerel dolor, Dentistmalestar o fiebre, segn las indicaciones del mdico.  Si le han recetado antibiticos, tmelos segn las indicaciones. Tmelos todos, aunque se sienta mejor.  Si le  aplicaron una gasa, siga las indicaciones del mdico para Nigeriacambiarla.  Para evitar la propagacin de la infeccin:  Mantenga el absceso cubierto con el vendaje.  Lvese bien las manos.  No comparta artculos de cuidado personal, toallas o jacuzzis con los dems.  Evite el contacto con la piel de Economistotras personas.  Mantenga la piel y la ropa limpia alrededor del absceso.  Cumpla con todas las visitas de control, segn le indique su mdico. SOLICITE ATENCIN MDICA SI:   Aumenta el dolor, la hinchazn, el enrojecimiento, drena lquido o sangra.  Siente dolores musculares, escalofros, o una sensacin general de Dentistmalestar.  Tiene fiebre. ASEGRESE DE QUE:   Comprende estas instrucciones.  Controlar su enfermedad.  Solicitar ayuda de inmediato si no mejora o si empeora. Document Released: 01/11/2005 Document Revised: 07/13/2011 [facility] Patient Information 2015 [place]. This information is not intended to replace advice given to you by your health care provider. Make sure you discuss any questions you have with your health care provider.   Dressing Change A dressing is a material placed over wounds. It keeps the wound clean, dry, and protected from further injury. This provides an environment that favors wound healing.  BEFORE YOU BEGIN  Get your supplies together. Things you may need include:  Saline solution.  Flexible gauze dressing.  Medicated cream.  Tape.  Gloves.  Abdominal dressing pads.  Gauze squares.  Plastic bags.  Take pain medicine 30 minutes before the dressing change if you need it.  Take a shower before you do the first dressing change of the day.  Use plastic wrap or a plastic bag to prevent the dressing from getting wet. REMOVING YOUR OLD DRESSING   Wash your hands with soap and water. Dry your hands with a clean towel.  Put on your gloves.  Remove any tape.  Carefully remove the old dressing. If the dressing sticks, you may dampen  it with warm water to loosen it, or follow your caregiver's specific directions.  Remove any gauze or packing tape that is in your wound.  Take off your gloves.  Put the gloves, tape, gauze, or any packing tape into a plastic bag. CHANGING YOUR DRESSING  Open the supplies.  Take the cap off the saline solution.  Open the gauze package so that the gauze remains on the inside of the package.  Put on your gloves.  Clean your wound as told by your caregiver.  If you have been told to keep your wound dry, follow those instructions.  Your caregiver may tell you to do one or more of the following:  Pick up the gauze. Pour the saline solution over the gauze. Squeeze out the extra saline solution.  Put medicated cream or other medicine on your wound if you have been told to do so.  Put the solution soaked gauze only in your wound, not on the skin around it.  Pack your wound loosely or as told by your caregiver.  Put dry gauze on your wound.  Put abdominal dressing pads over the dry gauze if your wet gauze soaks through.  Tape the abdominal dressing pads in place so they will not fall off. Do not wrap the tape completely around the affected part (arm, leg, abdomen).  Wrap the dressing pads with a flexible gauze dressing to secure it in place.  Take off your gloves. Put them in the plastic bag with the old dressing. Tie the bag shut and throw it away.  Keep the dressing clean and dry until your next dressing change.  Wash your hands. SEEK MEDICAL CARE IF:  Your skin around the wound looks red.  Your wound feels more tender or sore.  You see pus in the wound.  Your wound smells bad.  You have a fever.  Your skin around the wound has a rash that itches and burns.  You see black or yellow skin in your wound that was not there before.  You feel nauseous, throw up, and feel very tired. Document Released: 02/19/2004 Document Revised: 04/05/2011 Document Reviewed:  11/23/2010 [facility] Patient Information 2015 [place], [place]. This information is not intended to replace advice given to you by your health care provider. Make sure you discuss any questions you have with your health care provider.    Follow up with orthopedics in one week.   Also, follow up at your wound care appointment as well.   If your foot begins to get more painful, if you become febrile, if red streaks appear extending from your incision up your leg, or if you begin to have drainage then please return to the ED or to your PCP for evaluation.   Otherwise, follow up with your PCP on 3/14 as noted in the discharge instructions.   Sincerely,  Devota Pace, MD Family Medicine - PGY 1

## 2014-03-22 NOTE — Care Management Note (Signed)
    Page 1 of 1   03/22/2014     5:40:49 PM CARE MANAGEMENT NOTE 03/22/2014  Patient:  Mitchell [name],Mitchell [name]   Account Number:  [id number]  Date Initiated:  03/22/2014  Documentation initiated by:  Letha CapeAYLOR,Ashyia Schraeder  Subjective/Objective Assessment:   dx r foot abscess  admit as observaiton     Action/Plan:   Anticipated DC Date:  03/22/2014   Anticipated DC Plan:  HOME/SELF CARE      DC Planning Services  CM consult      Choice offered to / List presented to:             Status of service:  Completed, signed off Medicare Important Message given?  NO (If response is "NO", the following Medicare IM given date fields will be blank) Date Medicare IM given:   Medicare IM given by:   Date Additional Medicare IM given:   Additional Medicare IM given by:    Discharge Disposition:  HOME/SELF CARE  Per UR Regulation:  Reviewed for med. necessity/level of care/duration of stay  If discussed at Long Length of Stay Meetings, dates discussed:    Comments:  03/22/14 1737 Letha Capeeborah Loraine Freid RN BSN [phone] NCM spoke with patient with interpreter, he states he can not afford insulin, NCM called DM educator she will notify MD to chang patient to 70/30 per her note.  [facility] 70/30 insulin for $27.  Patient has an orange card , [facility]. NCM spoke with MD and he states he just spoke with patient with interpreter and patient states he can afford his meds. patient is for dc. This information was relayed to RN.

## 2014-03-22 NOTE — Progress Notes (Signed)
Dressing c/d/i Patient stable Pain moderate Begin silvadene cream BID to foot WBAT to heel only  N. Glee ArvinMichael Xu, MD [facility] [phone] 8:01 AM

## 2014-03-22 NOTE — Progress Notes (Signed)
Inpatient Diabetes Program Recommendations  AACE/ADA: New Consensus Statement on Inpatient Glycemic Control (2013)  Target Ranges:  Prepandial:   less than 140 mg/dL      Peak postprandial:   less than 180 mg/dL (1-2 hours)      Critically ill patients:  140 - 180 mg/dL   Results for Mitchell [name], Xzavior (MRN [id number]) as of 03/22/2014 08:30  Ref. Range 03/21/2014 16:39 03/21/2014 20:13 03/21/2014 23:33 03/22/2014 04:13 03/22/2014 08:10  Glucose-Capillary Latest Range: 70-99 mg/dL 782251 (H) 956307 (H) 213250 (H) 209 (H) 150 (H)    Reason for assessment: elevated CBG  Diabetes history: Type 2 Outpatient Diabetes medications: Glipizide 5mg  bid, Lantus 27 units q day Current orders for Inpatient glycemic control: Lantus 15 units q day, Novolog correction 0-15 units 6x/day  Now that the patient is eating, please consider changing Novolog to Novolog resistant correction scale 0-20 units tid and hs.  Susette RacerJulie Keundra Petrucelli, RN, BA, MHA, CDE Diabetes Coordinator Inpatient Diabetes Program  [phone] (Team Pager) [phone] Patrcia Dolly([place] Office) 03/22/2014 8:33 AM
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
## 2014-03-24 LAB — CULTURE, ROUTINE-ABSCESS

## 2014-03-25 ENCOUNTER — Telehealth: Payer: Self-pay | Admitting: Family Medicine

## 2014-03-25 MED ORDER — SULFAMETHOXAZOLE-TRIMETHOPRIM 800-160 MG PO TABS: 1.0000 | ORAL_TABLET | Freq: Two times a day (BID) | ORAL | Status: DC

## 2014-03-25 NOTE — Telephone Encounter (Signed)
Pt sensitivities have been completed on wound culture and are resistant to antibiotic pt was discharged on (clindamycin). Will changed antibiotic to SMZ-TMP DS 800-160 1 tab q 12 hours for 10 days. Please call patient with spanish speaking interpreter, I attempted to call him, but he did not understand me. I have contacted Graciella to call him when she is available. I have called in a new prescription. Pt is to stop the clindamycin.   Felix PaciniKuneff, [name] DO PGY3 [facility]

## 2014-03-26 LAB — CULTURE, BLOOD (ROUTINE X 2): CULTURE: NO GROWTH

## 2014-03-27 ENCOUNTER — Encounter (HOSPITAL_BASED_OUTPATIENT_CLINIC_OR_DEPARTMENT_OTHER): Payer: No Typology Code available for payment source | Attending: Surgery

## 2014-03-27 DIAGNOSIS — E11621 Type 2 diabetes mellitus with foot ulcer: Secondary | ICD-10-CM | POA: Insufficient documentation

## 2014-03-27 DIAGNOSIS — L84 Corns and callosities: Secondary | ICD-10-CM | POA: Insufficient documentation

## 2014-03-27 DIAGNOSIS — L97412 Non-pressure chronic ulcer of right heel and midfoot with fat layer exposed: Secondary | ICD-10-CM | POA: Insufficient documentation

## 2014-04-08 ENCOUNTER — Ambulatory Visit (INDEPENDENT_AMBULATORY_CARE_PROVIDER_SITE_OTHER): Payer: Self-pay | Admitting: Family Medicine

## 2014-04-08 ENCOUNTER — Encounter: Payer: Self-pay | Admitting: Family Medicine

## 2014-04-08 VITALS — BP 113/76 | HR 89 | Temp 97.8°F | Ht 64.0 in | Wt 180.0 lb

## 2014-04-08 DIAGNOSIS — E118 Type 2 diabetes mellitus with unspecified complications: Secondary | ICD-10-CM

## 2014-04-08 DIAGNOSIS — K219 Gastro-esophageal reflux disease without esophagitis: Secondary | ICD-10-CM

## 2014-04-08 DIAGNOSIS — L089 Local infection of the skin and subcutaneous tissue, unspecified: Secondary | ICD-10-CM

## 2014-04-08 LAB — POCT GLYCOSYLATED HEMOGLOBIN (HGB A1C): HEMOGLOBIN A1C: 7.7

## 2014-04-08 MED ORDER — GLIPIZIDE 5 MG PO TABS: 5.0000 mg | ORAL_TABLET | Freq: Two times a day (BID) | ORAL | Status: DC

## 2014-04-08 MED ORDER — OXYCODONE-ACETAMINOPHEN 5-325 MG PO TABS: 1.0000 | ORAL_TABLET | Freq: Four times a day (QID) | ORAL | Status: DC | PRN

## 2014-04-08 MED ORDER — INSULIN GLARGINE 100 UNIT/ML ~~LOC~~ SOLN: 33.0000 [IU] | Freq: Every day | SUBCUTANEOUS | Status: DC

## 2014-04-08 MED ORDER — METFORMIN HCL 1000 MG PO TABS: 1000.0000 mg | ORAL_TABLET | Freq: Two times a day (BID) | ORAL | Status: DC

## 2014-04-08 MED ORDER — OMEPRAZOLE 20 MG PO CPDR: 20.0000 mg | DELAYED_RELEASE_CAPSULE | Freq: Every day | ORAL | Status: DC
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
## 2014-04-08 NOTE — Assessment & Plan Note (Signed)
Patient had been doing better, we were making some lately with his blood sugars, and then he has recently had another foot infection. Impaction looks like it is improving, he is following up with orthopedics and wound center weekly. He has no longer need of antibiotics. We have increased his Lantus dose today to 33 units. Using the teach back method patient was able to explain that if his blood sugar is above 110 fasting in the morning, he is to increase his Lantus dose by one unit that evening, until he reaches a fasting blood glucose in the morning 110 or below. We are increasing his metformin today to 1000 mg twice a day. [facility] of [facility], he states on hospital discharge she was told that he could get his medications there and they would assist him. Follow-up 2-3 months

## 2014-04-08 NOTE — Patient Instructions (Addendum)
[facility] You can pick up her prescriptions there. Start with 33 units of your insulin tonight, increase the dose by 1 unit every day if your fasting sugar in the morning as above 110. Continue to do this until you see your blood sugar at 110 or below, what ever number of units she stopped on that night, will be your new dose. Continue to take your metformin and glipizide. I have increased her metformin dose to 1000 mg, take this twice a day. I will follow-up with you in 2-3 months.

## 2014-04-08 NOTE — Assessment & Plan Note (Signed)
But infection is improving, no drainage, no need for antibiotics. Still with moderate discomfort with walking. Refilled oxycodone today, advised patient that this will be his last prescription unless he has to undergo another surgery. Patient was in understanding and agreement. He is following up weekly with wound care, orthopedics has signed off as he is progressing well.

## 2014-05-01 ENCOUNTER — Encounter (HOSPITAL_BASED_OUTPATIENT_CLINIC_OR_DEPARTMENT_OTHER): Payer: No Typology Code available for payment source | Attending: Surgery

## 2014-05-01 DIAGNOSIS — L97511 Non-pressure chronic ulcer of other part of right foot limited to breakdown of skin: Secondary | ICD-10-CM | POA: Insufficient documentation

## 2014-05-01 DIAGNOSIS — E11621 Type 2 diabetes mellitus with foot ulcer: Secondary | ICD-10-CM | POA: Insufficient documentation

## 2014-05-29 ENCOUNTER — Encounter (HOSPITAL_BASED_OUTPATIENT_CLINIC_OR_DEPARTMENT_OTHER): Payer: No Typology Code available for payment source | Attending: Surgery

## 2014-05-29 DIAGNOSIS — E11621 Type 2 diabetes mellitus with foot ulcer: Secondary | ICD-10-CM | POA: Insufficient documentation

## 2014-05-29 DIAGNOSIS — L97519 Non-pressure chronic ulcer of other part of right foot with unspecified severity: Secondary | ICD-10-CM | POA: Insufficient documentation

## 2014-06-11 ENCOUNTER — Ambulatory Visit (INDEPENDENT_AMBULATORY_CARE_PROVIDER_SITE_OTHER): Payer: No Typology Code available for payment source | Admitting: Family Medicine

## 2014-06-11 ENCOUNTER — Encounter: Payer: Self-pay | Admitting: Family Medicine

## 2014-06-11 VITALS — BP 125/81 | HR 80 | Temp 98.2°F | Wt 179.2 lb

## 2014-06-11 DIAGNOSIS — M25561 Pain in right knee: Secondary | ICD-10-CM

## 2014-06-11 DIAGNOSIS — M79671 Pain in right foot: Secondary | ICD-10-CM

## 2014-06-11 MED ORDER — TRAMADOL HCL 50 MG PO TABS: 50.0000 mg | ORAL_TABLET | Freq: Three times a day (TID) | ORAL | Status: DC | PRN

## 2014-06-11 NOTE — Patient Instructions (Signed)
Thank you for coming in,   Please show your lesion to the clinic that you go to on Wednesdays. They will have the ability to compare it to previous sessions.   Please follow up with Dr. Claiborne BillingsKuneff if you continue to have pain.    Please feel free to call with any questions or concerns at any time, at [phone]. --Dr. Jordan LikesSchmitz

## 2014-06-11 NOTE — Progress Notes (Signed)
    Subjective   Mitchell [name] is a 39 y.o. male that presents for a same day visit  EXTREMITY PAIN He currently has a resolving right foot infection. He has been followed up with orthopedics and wound care center every week. Both of which are stating that the wound is healing well. He denies any drainage from the wound.   Location: Right leg pain. Starts in the foot and radiates to the knee  Pain started: started on Sunday  Pain is: throbbing  Severity: 8/10 Medications tried: roxicet  Recent trauma: no Similar pain previously: no  Symptoms Redness: no Swelling:no Fever: reported subjective  Weakness: no  Weight loss: no  Rash: no   He reports that he is followed by a wound center and sees them every Wednesday.  He does admit to moderate pain with ambulation. He has been wearing his nonweightbearing boot. Denies any fever.   History  Substance Use Topics  . Smoking status: Never Smoker   . Smokeless tobacco: Never Used  . Alcohol Use: No     Comment: 12/15/2011 "3 packages of 12 beers/wk", LAST DRINK 2014    ROS Per HPI  Objective   BP 125/81 mmHg  Pulse 80  Temp(Src) 98.2 F (36.8 C) (Oral)  Wt 179 lb 4 oz (81.307 kg)  General: NAD, non-toxic appearing  HEENT:  Respiratory/Chest: non-labored Cardiovascular: RRR Extremities: Right lower extremity with post surgical changes. No erythema or edema on right LE, ulcer with callous formation at proximal head of the first Metatarsal bone, no drainage, amputated first toe,   Assessment and Plan   Please refer to problem based charting of assessment and plan

## 2014-06-12 ENCOUNTER — Encounter: Payer: Self-pay | Admitting: Family Medicine

## 2014-06-12 NOTE — Assessment & Plan Note (Addendum)
No suggestion of infection and his ulcer appers stable and old upon chart review. He has follow up every Wednesday in wound care so instructed to show his foot if he thinks it has changed as all for continuity of care. He is wanting something for pain. Dr. Claiborne BillingsKuneff gave last script of narcotic during last visit and not to refill unless he is having surgery again.  - give tramadol  - f/u with PCP as needed  - instructed he should also contact his surgeon if he notices any changes to his foot.

## 2014-06-23 ENCOUNTER — Other Ambulatory Visit: Payer: Self-pay | Admitting: Family Medicine

## 2014-06-25 ENCOUNTER — Ambulatory Visit (INDEPENDENT_AMBULATORY_CARE_PROVIDER_SITE_OTHER): Payer: No Typology Code available for payment source | Admitting: Family Medicine

## 2014-06-25 ENCOUNTER — Encounter: Payer: Self-pay | Admitting: Family Medicine

## 2014-06-25 VITALS — BP 120/72 | HR 87 | Ht 64.0 in | Wt 179.0 lb

## 2014-06-25 DIAGNOSIS — E118 Type 2 diabetes mellitus with unspecified complications: Secondary | ICD-10-CM

## 2014-06-25 DIAGNOSIS — M25561 Pain in right knee: Secondary | ICD-10-CM

## 2014-06-25 MED ORDER — METFORMIN HCL 1000 MG PO TABS: 1000.0000 mg | ORAL_TABLET | Freq: Two times a day (BID) | ORAL | Status: DC
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
## 2014-06-25 NOTE — Patient Instructions (Signed)
It was great seeing you again. I am glad your knee is feeling better. If they get swollen, red or it feels warm you should come in immediately. Keep your insulin dose at the same 38 units. Continue take your metformin 1000 mg twice a day. Come in to meet her new PCP at the very beginning of July, at that time we can check your A1c as well.

## 2014-06-26 ENCOUNTER — Encounter (HOSPITAL_BASED_OUTPATIENT_CLINIC_OR_DEPARTMENT_OTHER): Payer: No Typology Code available for payment source | Attending: Surgery

## 2014-06-26 DIAGNOSIS — L84 Corns and callosities: Secondary | ICD-10-CM | POA: Insufficient documentation

## 2014-06-26 DIAGNOSIS — M25561 Pain in right knee: Secondary | ICD-10-CM | POA: Insufficient documentation

## 2014-06-26 DIAGNOSIS — Z872 Personal history of diseases of the skin and subcutaneous tissue: Secondary | ICD-10-CM | POA: Insufficient documentation

## 2014-06-26 DIAGNOSIS — L97411 Non-pressure chronic ulcer of right heel and midfoot limited to breakdown of skin: Secondary | ICD-10-CM | POA: Insufficient documentation

## 2014-06-26 DIAGNOSIS — E11621 Type 2 diabetes mellitus with foot ulcer: Secondary | ICD-10-CM | POA: Insufficient documentation

## 2014-06-26 NOTE — Assessment & Plan Note (Signed)
Next a1c not due for next few weeks.  - Advised pt to continue taking medications as prescribed. He is able to read back/teach back his medication regimen - continue to watch his diet and remain as active as possible.  - Next wound care appt tomorrow. Pt reminded.  - pt encouraged to make an appt for early July to meet new PCP and have a1c completed at that time.  - F/U 4 weeks.

## 2014-06-26 NOTE — Assessment & Plan Note (Signed)
Improved. Normal exam today.  Advised him tramadol is for severe pain only. He understands. Since asymptomatic today, advised him to return if pain, redness or swelling occur.

## 2014-07-17 ENCOUNTER — Ambulatory Visit: Payer: Self-pay

## 2014-07-31 ENCOUNTER — Encounter (HOSPITAL_BASED_OUTPATIENT_CLINIC_OR_DEPARTMENT_OTHER): Payer: No Typology Code available for payment source | Attending: Surgery

## 2014-07-31 DIAGNOSIS — E11621 Type 2 diabetes mellitus with foot ulcer: Secondary | ICD-10-CM | POA: Insufficient documentation

## 2014-07-31 DIAGNOSIS — L97411 Non-pressure chronic ulcer of right heel and midfoot limited to breakdown of skin: Secondary | ICD-10-CM | POA: Insufficient documentation

## 2014-07-31 DIAGNOSIS — L84 Corns and callosities: Secondary | ICD-10-CM | POA: Insufficient documentation

## 2014-08-22 ENCOUNTER — Ambulatory Visit (INDEPENDENT_AMBULATORY_CARE_PROVIDER_SITE_OTHER): Payer: No Typology Code available for payment source | Admitting: Student

## 2014-08-22 ENCOUNTER — Encounter: Payer: Self-pay | Admitting: Student

## 2014-08-22 VITALS — BP 130/77 | HR 74 | Temp 98.0°F | Ht 64.0 in | Wt 181.3 lb

## 2014-08-22 DIAGNOSIS — K219 Gastro-esophageal reflux disease without esophagitis: Secondary | ICD-10-CM

## 2014-08-22 DIAGNOSIS — M79671 Pain in right foot: Secondary | ICD-10-CM

## 2014-08-22 DIAGNOSIS — E118 Type 2 diabetes mellitus with unspecified complications: Secondary | ICD-10-CM

## 2014-08-22 DIAGNOSIS — M25561 Pain in right knee: Secondary | ICD-10-CM

## 2014-08-22 LAB — POCT GLYCOSYLATED HEMOGLOBIN (HGB A1C): Hemoglobin A1C: 7.4

## 2014-08-22 MED ORDER — TRAMADOL HCL 50 MG PO TABS: 50.0000 mg | ORAL_TABLET | Freq: Three times a day (TID) | ORAL | Status: DC | PRN

## 2014-08-22 NOTE — Patient Instructions (Signed)
It is nice to meet you to day! We have talked about your two things:  Blood sugar: it seems your blood sugar is well controlled. I won't be changing anything today. Continue taking medicines and do more of vegetables and fruits. I also like to see eye doctor as soon as possible. Check your feet more frequently. We are also checking urine for protein to make sure that your kidneys are okay. I will give you a call if the result is abnormal.  Right foot pain: I refilled your prescription for pain medication today. Look out for swelling, worsening wound or fever.   Diabetes y Doroteo Glassman fsica (Diabetes and Exercise) Hacer actividad fsica con regularidad es muy importante. No se trata solo de [employer]. Tiene muchos otros beneficios, como por ejemplo:  Mejorar el estado fsico, la flexibilidad y la resistencia.  Aumenta la densidad sea.  Ayuda a Art gallery manager.  Disminuye la Art gallery manager.  Aumenta la fuerza muscular.  Reduce el estrs y las tensiones.  Mejora el estado de salud general. Las personas diabticas que realizan actividad fsica tienen beneficios adicionales debido al ejercicio:  Reduce el apetito.  El organismo mejora el uso del azcar (glucosa) de la [place].  Ayuda a disminuir o Engineer, maintenance (IT).  Disminuye la presin arterial.  Ayuda a disminuir los lpidos en la sangre (colesterol y triglicridos).  El organismo mejora el uso de la insulina porque:  Aumenta la sensibilidad del organismo a la insulina.  Reduce las necesidades de insulina del organismo.  Disminuye el riesgo de enfermedad cardaca por la actividad fsica ya que  disminuye el colesterol y [employer] triglicridos.  Aumenta los niveles de colesterol bueno (como las lipoprotenas de alta densidad [HDL]) en el organismo.  Disminuye los niveles de glucosa en la [place]. SU PLAN DE ACTIVIDAD  Elija una actividad que disfrute y establezca objetivos realistas. Su mdico o  educador en diabetes podrn ayudarlo a encontrar una actividad que lo beneficie. Haga ejercicio regularmente como se lo haya indicado el mdico. Esto incluye:  Hacer entrenamiento de [employer] a la semana, como flexiones, sentadillas, levantar peso o usar bandas de resistencia.  Practicar de ejercicios cardiovasculares cada semana, como caminar, correr o hacer algn deporte.  Mantenerse activo y no permanecer inactivo durante ms de seguidos. Los perodos cortos de [place] tambin son beneficiosos. Tres sesiones de a lo largo del da son tan beneficiosas como una sola sesin de . Estas son algunas ideas para los ejercicios:  Lleve a Multimedia programmer.  Utilice las Microbiologist del ascensor.  Baile su cancin favorita.  Haga los ejercicios de un video de ejercicios.  Haga sus ejercicios favoritos con Leisure centre manager. RECOMENDACIONES PARA REALIZAR EJERCICIOS CUANDO SE TIENE DIABETES TIPO 1 O TIPO 2   Controle la glucosa en la sangre antes de comenzar. Si el nivel de glucosa en la sangre es de ms de 240 mg/dl, controle las cetonas en la [place]. No haga actividad fsica si hay cetonas.  Evite inyectarse insulina en las zonas del cuerpo que ejercitar. Por ejemplo, evite inyectarse insulina en:  Los brazos, si juega al tenis.  Las piernas, si corre.  Lleve un registro de:  Los alimentos que consume antes y despus de Tour manager.  Los momentos esperables de picos de accin de la insulina.  Los niveles de glucosa en la sangre antes y despus de hacer ejercicios.  El tipo y cantidad de actividad fsica que  realiza.  Revise los registros con su mdico. El mdico lo ayudar a Environmental education officer pautas para ajustar la cantidad de alimento y las cantidades de insulina antes y despus de Radio producer ejercicios.  Si toma insulina o agentes hipoglucemiantes por va oral, observe si hay signos y sntomas de hipoglucemia. Entre los que se  incluyen:  Mareos.  Temblores.  Sudoracin.  Escalofros.  Confusin.  Beba gran cantidad de agua mientras hace ejercicios para evitar la deshidratacin o los golpes de Airline pilot. Durante la actividad fsica se pierde agua corporal que se debe reponer.  Comente con su mdico antes de comenzar un programa de actividad fsica para verificar que sea seguro para usted. Recuerde, cualquier actividad es mejor que ninguna. Document Released: 01/31/2007 Document Revised: 05/28/2013 [facility] Patient Information 2015 [place], [place]. This information is not intended to replace advice given to you by your health care provider. Make sure you discuss any questions you have with your health care provider.
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
## 2014-08-22 NOTE — Assessment & Plan Note (Signed)
Appears well controlled based on his A1c which is down to 7.4 from 7.7 prior. However, he has polydipsia, polyuria with worsening nocturnal micturition.  - check urine for microalbuminuria - Will continue his meds as they are given improving A1c - Will check A1c in three months - recommended seeing opthalmologist - discussed about frequent feet check - discussed about life style, particularly diet.
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
## 2014-08-23 LAB — MICROALBUMIN / CREATININE URINE RATIO
Creatinine, Urine: 10.8 mg/dL
Microalb, Ur: <0.2 mg/dL (ref <2.0)

## 2014-08-25 ENCOUNTER — Other Ambulatory Visit: Payer: Self-pay | Admitting: Family Medicine

## 2014-08-25 DIAGNOSIS — E118 Type 2 diabetes mellitus with unspecified complications: Secondary | ICD-10-CM

## 2014-08-28 ENCOUNTER — Ambulatory Visit: Payer: Self-pay | Admitting: Student

## 2014-08-28 ENCOUNTER — Encounter (HOSPITAL_BASED_OUTPATIENT_CLINIC_OR_DEPARTMENT_OTHER): Payer: No Typology Code available for payment source | Attending: Surgery

## 2014-08-28 DIAGNOSIS — L84 Corns and callosities: Secondary | ICD-10-CM | POA: Insufficient documentation

## 2014-08-28 DIAGNOSIS — L97411 Non-pressure chronic ulcer of right heel and midfoot limited to breakdown of skin: Secondary | ICD-10-CM | POA: Insufficient documentation

## 2014-08-28 DIAGNOSIS — E11621 Type 2 diabetes mellitus with foot ulcer: Secondary | ICD-10-CM | POA: Insufficient documentation

## 2014-08-30 MED ORDER — GLIPIZIDE 5 MG PO TABS: 5.0000 mg | ORAL_TABLET | Freq: Two times a day (BID) | ORAL | Status: DC

## 2014-10-02 ENCOUNTER — Encounter (HOSPITAL_BASED_OUTPATIENT_CLINIC_OR_DEPARTMENT_OTHER): Payer: No Typology Code available for payment source | Attending: Surgery

## 2014-10-02 DIAGNOSIS — L84 Corns and callosities: Secondary | ICD-10-CM | POA: Insufficient documentation

## 2014-10-02 DIAGNOSIS — L97412 Non-pressure chronic ulcer of right heel and midfoot with fat layer exposed: Secondary | ICD-10-CM | POA: Insufficient documentation

## 2014-10-02 DIAGNOSIS — E11621 Type 2 diabetes mellitus with foot ulcer: Secondary | ICD-10-CM | POA: Insufficient documentation

## 2014-10-24 LAB — GLUCOSE, CAPILLARY: GLUCOSE-CAPILLARY: 285 mg/dL — AB (ref 65–99)

## 2014-10-30 ENCOUNTER — Encounter (HOSPITAL_BASED_OUTPATIENT_CLINIC_OR_DEPARTMENT_OTHER): Payer: No Typology Code available for payment source | Attending: Surgery

## 2014-10-30 DIAGNOSIS — E11621 Type 2 diabetes mellitus with foot ulcer: Secondary | ICD-10-CM | POA: Insufficient documentation

## 2014-10-30 DIAGNOSIS — L97511 Non-pressure chronic ulcer of other part of right foot limited to breakdown of skin: Secondary | ICD-10-CM | POA: Insufficient documentation

## 2014-10-30 DIAGNOSIS — L84 Corns and callosities: Secondary | ICD-10-CM | POA: Insufficient documentation

## 2014-11-20 ENCOUNTER — Ambulatory Visit (INDEPENDENT_AMBULATORY_CARE_PROVIDER_SITE_OTHER): Payer: No Typology Code available for payment source | Admitting: Family Medicine

## 2014-11-20 VITALS — BP 140/62 | HR 65 | Temp 97.8°F | Wt 180.0 lb

## 2014-11-20 DIAGNOSIS — R21 Rash and other nonspecific skin eruption: Secondary | ICD-10-CM

## 2014-11-20 DIAGNOSIS — M25561 Pain in right knee: Secondary | ICD-10-CM

## 2014-11-20 DIAGNOSIS — M79671 Pain in right foot: Secondary | ICD-10-CM

## 2014-11-20 MED ORDER — TRAMADOL HCL 50 MG PO TABS: 50.0000 mg | ORAL_TABLET | Freq: Three times a day (TID) | ORAL | Status: DC | PRN

## 2014-11-20 MED ORDER — HYDROCORTISONE 1 % EX OINT: 1.0000 "application " | TOPICAL_OINTMENT | Freq: Two times a day (BID) | CUTANEOUS | Status: DC

## 2014-11-20 MED ORDER — HYDROCORTISONE 1 % EX OINT
1.0000 | TOPICAL_OINTMENT | Freq: Two times a day (BID) | CUTANEOUS | Status: DC
Start: 2014-11-20 — End: 2015-03-07

## 2014-11-20 NOTE — Patient Instructions (Addendum)
Rash Treatment - you should:apply the cream prescribed today 2 times a day. You should be better in: 5-7 days  Call us or[place] go to the ER if you have high fever, sores in your mouth, feel very ill or the rash is a lot worse. Come back to see us as needed.

## 2014-11-20 NOTE — Progress Notes (Signed)
RASH  Had rash for 2 weeks. Location: chest, arms, and legs Medications tried: none Similar rash in past: no New medications or antibiotics: no Tick, Insect or new pet exposure: no Recent travel: no New detergent or soap: no Immunocompromised: no  Symptoms Itching: yes Pain over rash: no Feeling ill all over: no Fever: no Mouth sores: no Face or tongue swelling: no Trouble breathing: no Joint swelling or pain: no  Review of Symptoms - see HPI PMH - Smoking status noted.    Objective: BP 140/62 mmHg  Pulse 65  Temp(Src) 97.8 F (36.6 C) (Oral)  Wt 180 lb (81.647 kg) Gen: NAD, alert, cooperative, and pleasant. HEENT: NCAT, EOMI, PERRL Integument: Patient's forearms have multiple erythematous papular lesions. Some with a central vesicle. Surrounding excoriations present. No evidence of cellulitis. Neuro: Alert and oriented, Speech clear, No gross deficits  Assessment and plan:  Rash and nonspecific skin eruption Patient currently has a mild pruritic rash on his bilateral arms. Unknown etiology at this time. Characteristics of viral cause. Possible atopic dermatitis or contact dermatitis. - Topical steroid ointment provided, for pruritus and skin dryness.    Meds ordered this encounter  Medications  . traMADol (ULTRAM) 50 MG tablet    Sig: Take 1 tablet (50 mg total) by mouth every 8 (eight) hours as needed.    Dispense:  90 tablet    Refill:  0  . DISCONTD: hydrocortisone 1 % ointment    Sig: Apply 1 application topically 2 (two) times daily.    Dispense:  30 g    Refill:  0  . hydrocortisone 1 % ointment    Sig: Apply 1 application topically 2 (two) times daily.    Dispense:  30 g    Refill:  0     Kathee DeltonIan D Alexsis Branscom, MD,MS,  PGY2 11/21/2014 7:05 PM

## 2014-11-21 DIAGNOSIS — R21 Rash and other nonspecific skin eruption: Secondary | ICD-10-CM | POA: Insufficient documentation

## 2014-11-21 NOTE — Assessment & Plan Note (Signed)
Patient currently has a mild pruritic rash on his bilateral arms. Unknown etiology at this time. Characteristics of viral cause. Possible atopic dermatitis or contact dermatitis. - Topical steroid ointment provided, for pruritus and skin dryness.

## 2014-11-25 ENCOUNTER — Ambulatory Visit (INDEPENDENT_AMBULATORY_CARE_PROVIDER_SITE_OTHER): Payer: No Typology Code available for payment source | Admitting: Student

## 2014-11-25 ENCOUNTER — Encounter: Payer: Self-pay | Admitting: Student

## 2014-11-25 VITALS — BP 126/84 | HR 85 | Temp 98.4°F | Wt 178.4 lb

## 2014-11-25 DIAGNOSIS — L282 Other prurigo: Secondary | ICD-10-CM

## 2014-11-25 DIAGNOSIS — E118 Type 2 diabetes mellitus with unspecified complications: Secondary | ICD-10-CM

## 2014-11-25 DIAGNOSIS — R21 Rash and other nonspecific skin eruption: Secondary | ICD-10-CM

## 2014-11-25 LAB — POCT GLYCOSYLATED HEMOGLOBIN (HGB A1C): HEMOGLOBIN A1C: 7.7

## 2014-11-25 MED ORDER — HYDROXYZINE PAMOATE 25 MG PO CAPS: 25.0000 mg | ORAL_CAPSULE | Freq: Three times a day (TID) | ORAL | Status: DC | PRN

## 2014-11-25 NOTE — Patient Instructions (Signed)
It was great seeing you today! We have addressed the following issues today  1. Skin rash: I gave you medications to help with itching. You can use some vaseline as well 2. Diabetes: will check your blood today for A1c.    If we did any lab work today, and the results require attention, either me or my nurse will get in touch with you. Otherwise, I look forward to talking with you again at our next visit. If you have any questions or concerns before then, please call the clinic at [phone].  Please bring all your medications to every doctors visit   Sign up for My Chart to have easy access to your labs results, and communication with your Primary care physician.    Please check-out at the front desk before leaving the clinic.   Take Care,   Candelaria Stagersaye Helma Argyle, MD.

## 2014-11-25 NOTE — Assessment & Plan Note (Signed)
Unclear etiology. Likely insect bites given distribution.  -Hydroxyzine 25 mg tid prn -Return or call if no improvement or worsening. Will consider referral to derm clinic.

## 2014-11-25 NOTE — Assessment & Plan Note (Signed)
FBG in the range of 120-130. Denies polyuria, polydipsia and polyphagia. Reports good compliance with medications. -Will check A1c today.

## 2014-11-25 NOTE — Progress Notes (Signed)
Subjective:     Mitchell [name] is a 39 y.o. male who presents for evaluation of a rash involving the arms, legs and chest. None on his belly, back or face. Rash started 3 weeks ago. Rash is pruritic. It is so itchy that he scratch to the extent of bleeding. Rash starts with small blister that breaks with clear fluid. No pus. He presented to clinic four days ago and was given hydrocortisone cream. The cream helped a little bit but he is out of it now. He never had a rash like this before.  No fever, cold like symptoms or diarrhea. Nausea this morning but no emesis. No new medication, new soap or detergent.  He works in Software engineerrestaurant helping cooks. Any changes sleeping place.  FBGs 120-130. Denies polyuria, polydipsia and polyphagia.  Review of Systems   Objective:    There were no vitals taken for this visit. General:  alert, cooperative and appears stated age  Skin:   Erythematous maculopapular rash about 1-773mm in diameter over the his forearms bilaterally, lower legs bilaterally and chest. No skin rash over his back or abdomen. No blister or pustules.     Assessment:  No clear what to make of this rash. Doesn't look infected. Distribution exposed body parts and the pruritic nature makes insect bites more likely. Viral etiologies are unlikely. Less systemic symptoms which is reassuring.   Diabetes: FBG in the range of 120-130. Denies polyuria, polydipsia and polyphagia. Reports good compliance with medications. Plan:  Rash:   Medications: Hydroxyzine 25 mg tid prn itching. Gave him 30 tablets.    Diabetes: -Will check  A1c and follow up

## 2014-11-27 ENCOUNTER — Encounter (HOSPITAL_BASED_OUTPATIENT_CLINIC_OR_DEPARTMENT_OTHER): Payer: No Typology Code available for payment source | Attending: Surgery

## 2014-11-27 DIAGNOSIS — E11621 Type 2 diabetes mellitus with foot ulcer: Secondary | ICD-10-CM | POA: Insufficient documentation

## 2014-11-27 DIAGNOSIS — L84 Corns and callosities: Secondary | ICD-10-CM | POA: Insufficient documentation

## 2014-11-27 DIAGNOSIS — L97411 Non-pressure chronic ulcer of right heel and midfoot limited to breakdown of skin: Secondary | ICD-10-CM | POA: Insufficient documentation

## 2014-12-16 ENCOUNTER — Other Ambulatory Visit: Payer: Self-pay | Admitting: *Deleted

## 2014-12-16 DIAGNOSIS — E119 Type 2 diabetes mellitus without complications: Secondary | ICD-10-CM

## 2014-12-17 MED ORDER — METFORMIN HCL 1000 MG PO TABS: 1000.0000 mg | ORAL_TABLET | Freq: Two times a day (BID) | ORAL | Status: DC

## 2015-02-10 ENCOUNTER — Ambulatory Visit: Payer: Self-pay

## 2015-03-07 ENCOUNTER — Encounter: Payer: Self-pay | Admitting: Student

## 2015-03-07 ENCOUNTER — Ambulatory Visit (INDEPENDENT_AMBULATORY_CARE_PROVIDER_SITE_OTHER): Payer: No Typology Code available for payment source | Admitting: Student

## 2015-03-07 VITALS — BP 135/85 | HR 71 | Temp 97.9°F | Ht 64.0 in | Wt 178.0 lb

## 2015-03-07 DIAGNOSIS — R21 Rash and other nonspecific skin eruption: Secondary | ICD-10-CM

## 2015-03-07 DIAGNOSIS — E118 Type 2 diabetes mellitus with unspecified complications: Secondary | ICD-10-CM

## 2015-03-07 DIAGNOSIS — E119 Type 2 diabetes mellitus without complications: Secondary | ICD-10-CM

## 2015-03-07 DIAGNOSIS — M25561 Pain in right knee: Secondary | ICD-10-CM

## 2015-03-07 DIAGNOSIS — Z794 Long term (current) use of insulin: Secondary | ICD-10-CM

## 2015-03-07 DIAGNOSIS — M79671 Pain in right foot: Secondary | ICD-10-CM

## 2015-03-07 LAB — BASIC METABOLIC PANEL WITH GFR
BUN: 8 mg/dL (ref 7–25)
CALCIUM: 8.9 mg/dL (ref 8.6–10.3)
CO2: 24 mmol/L (ref 20–31)
CREATININE: 0.69 mg/dL (ref 0.60–1.35)
Chloride: 103 mmol/L (ref 98–110)
GFR, Est Non African American: >89 mL/min (ref >=60)
GLUCOSE: 216 mg/dL — AB (ref 65–99)
Potassium: 3.4 mmol/L — ABNORMAL LOW (ref 3.5–5.3)
Sodium: 134 mmol/L — ABNORMAL LOW (ref 135–146)

## 2015-03-07 LAB — POCT GLYCOSYLATED HEMOGLOBIN (HGB A1C): Hemoglobin A1C: 8.8

## 2015-03-07 MED ORDER — GLIPIZIDE 5 MG PO TABS: 5.0000 mg | ORAL_TABLET | Freq: Two times a day (BID) | ORAL | Status: DC

## 2015-03-07 MED ORDER — METFORMIN HCL 1000 MG PO TABS: 1000.0000 mg | ORAL_TABLET | Freq: Two times a day (BID) | ORAL | Status: DC

## 2015-03-07 MED ORDER — IBUPROFEN 600 MG PO TABS: 600.0000 mg | ORAL_TABLET | Freq: Three times a day (TID) | ORAL | Status: DC | PRN

## 2015-03-07 MED ORDER — TRAMADOL HCL 50 MG PO TABS: 50.0000 mg | ORAL_TABLET | Freq: Three times a day (TID) | ORAL | Status: DC | PRN

## 2015-03-07 MED ORDER — PREDNISONE 20 MG PO TABS: 40.0000 mg | ORAL_TABLET | Freq: Every day | ORAL | Status: DC
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
## 2015-03-07 NOTE — Patient Instructions (Addendum)
It was great seeing you today! We have addressed the following issues today   Diabetes: I will check your blood today  Leg pain: sent a prescription to your pharmacy. Make sure you take it with food.  Skin rash: sent a steroid prescription to your pharmacy. Please take this medication with food as well.    If we did any lab work today, and the results require attention, either me or my nurse will get in touch with you. If everything is normal, you will get a letter in mail. If you don't hear from [place] in two weeks, please give [place] a call. Otherwise, I look forward to talking with you again at our next visit. If you have any questions or concerns before then, please call the clinic at [phone].  Please bring all your medications to every doctors visit   Sign up for My Chart to have easy access to your labs results, and communication with your Primary care physician.    Please check-out at the front desk before leaving the clinic.   Take Care,    Hipoglucemia (Hypoglycemia) La hipoglucemia se produce cuando el nivel de glucosa en la sangre es demasiado bajo. La glucosa es un tipo de azcar, que es la principal fuente de energa del cuerpo. Hormonas, como la insulina y Oncologist, Systems developer el nivel de glucosa en la [place]. La insulina reduce el nivel de la glucosa en la sangre, mientras que el glucagn lo [place]. Si tiene demasiada insulina en el torrente sanguneo o si no ingiere suficientes alimentos que contengan azcar, puede desarrollar hipoglucemia. Esta afeccin puede manifestarse en personas con o sin diabetes. Puede desarrollarse rpidamente y, como consecuencia, necesitar atencin urgente.  CAUSAS   Omitir o retrasar comidas.  No ingerir demasiados carbohidratos en las comidas.  Consumo excesivo de medicamentos para la diabetes.  No coordinar el horario de la toma de medicamentos por va oral para la diabetes o de insulina, con las comidas, las colaciones y Programmer, systems.  Nuseas y vmitos.  Algunos medicamentos.  Enfermedades graves, como hepatitis, trastornos renales y ciertos trastornos de Psychologist, sport and exercise.  Aumento de la actividad fsica o el ejercicio, sin ingerir alimentos adicionales o ajustar los medicamentos.  Beber alcohol en exceso.  Un trastorno nervioso que afecta las funciones corporales, como la frecuencia cardaca, presin arterial y digestin (neuropata [place]).  Una afeccin en la cual los msculos del estmago no funcionan apropiadamente (gastroparesia). Por consiguiente, los medicamentos y los alimentos no pueden absorberse Merchandiser, retail.  Pocas veces un tumor de pncreas puede producir demasiada insulina. SNTOMAS   Hambre.  Sudoraciones (diaforesis).  Cambio en la Arts development officer.  Temblores.  Dolor de [place].  Ansiedad.  Aturdimiento.  Irritabilidad.  Dificultad para concentrarse.  Sequedad en la boca.  Hormigueo o adormecimiento de las manos y los pies.  Sueo agitado o alteraciones del sueo.  Alteracin en el habla y la coordinacin.  Cambio en el estado mental.  Convulsiones breves o prolongadas.  Agresividad  Somnolencia (letargo).  Debilidad.  Aumento de la frecuencia cardaca o palpitaciones.  Confusin.  Piel plida o de [employer].  Visin borrosa o doble.  Desmayos. DIAGNSTICO  Le harn un examen fsico y [place] historia clnica. Su mdico puede hacer un diagnstico en funcin de sus sntomas. Pueden realizarle anlisis de sangre y otras pruebas de laboratorio para Pharmacist, hospital diagnstico. Una vez realizado el diagnstico, su mdico observar si los signos y sntomas desaparecen, una vez que aumenta el nivel de la  glucosa en la sangre.  TRATAMIENTO  Por lo general, la hipoglucemia puede tratarse fcilmente cuando se observan sntomas.  Controle su nivel de glucosa en la sangre. Si es menor que 70 mg/dl, tome uno de los siguientes:  3 o 4 comprimidos de  glucosa.   taza de jugo.   taza de una gaseosa comn.  1 taza de [employer].   a 1 pomo de glucosa en gel.  5 a 6 caramelos duros.  Evite las bebidas o los alimentos con alto contenido de grasa, que pueden retrasar el aumento de los niveles de glucosa en la [place].  No ingiera ms de la cantidad recomendada de alimentos, bebidas, gel o comprimidos que contengan azcar. Si lo hace, el nivel de glucosa en la sangre subir demasiado.  Espere de 10 a 15 minutos y vuelva a Chief Operating Officer su nivel de glucosa en la sangre. Si an es Scientist, water quality 70 mg/dl o est por debajo del intervalo indicado, repita el tratamiento.  Ingiera una colacin si falta ms de 1 hora para su prxima comida. Es posible que, [address], su nivel de glucosa en la sangre baje [place], de modo que no pueda tratarse en su casa, cuando comience a observar los sntomas. Probablemente necesite ayuda. Incluso puede desmayarse o ser incapaz de tragar. Si no puede tratarse por s solo, alguien Customer service manager al hospital.  INSTRUCCIONES PARA EL CUIDADO EN EL HOGAR  Si tiene diabetes, siga su plan de control de la diabetes:  Tome los medicamentos segn las indicaciones.  Siga el plan de ejercicio.  Siga el plan de comidas. No saltee comidas. Coma a horario.  Controle su nivel de glucosa en la sangre peridicamente. Controle su nivel de glucosa en la sangre antes y despus de ejercitarse. Si hace ejercicio durante ms tiempo o de [employer] de lo habitual, asegrese de Chief Operating Officer su nivel de glucosa en la sangre con mayor frecuencia.  Use su pulsera o medalla de alerta mdica, que indica que usted tiene diabetes.  Identifique la causa de su hipoglucemia. Luego, desarrolle formas de prevenir la recurrencia de la hipoglucemia.  No tome un bao o una ducha caliente inmediatamente despus de una inyeccin de insulina.  Siempre lleve [employer]. Las pastillas de glucosa son fciles de Midwife.  Si va a  beber alcohol, bbalo solo con las comidas.  Informe a familiares y [place] qu pueden hacer para mantenerlo seguro durante una convulsin. Esto puede incluir retirar [employer] duros o filosos del rea o colocarlo de costado.  Mantenga un peso saludable. SOLICITE ATENCIN MDICA SI:   Tiene problemas para Futures trader de glucosa en la sangre dentro del intervalo indicado.  Tiene episodios frecuentes de hipoglucemia.  Siente efectos secundarios por los medicamentos prescritos.  No est seguro por qu su nivel de glucosa en la sangre es tan bajo.  Nota cambios o un nuevo problema en la visin . SOLICITE ATENCIN MDICA DE INMEDIATO SI:   Presenta confusin.  Se produce un cambio en su estado mental.  Es incapaz de tragar.  Se desmaya.   Esta informacin no tiene Theme park manager el consejo del mdico. Asegrese de hacerle al mdico cualquier pregunta que tenga.   Document Released: 01/11/2005 Document Revised: 01/16/2013 Elsevier Interactive Patient Education [employer]

## 2015-03-07 NOTE — Assessment & Plan Note (Signed)
Differential diagnosis are granuloma annulare, prurigo nodularis, contact dermatitis & lichen planus. Tried hydrocortisone cream and hydroxyzine in the past. -Will try oral prednisone 40 mg daily for days. If no improvement with this, will refer to derm clinic for possible biopsy.

## 2015-03-07 NOTE — Assessment & Plan Note (Addendum)
Appears stable. Last A1c 7.7 about three months ago. Reports good compliance with his medications. No sign of hypoglycemia. -Will check A1c and Bmet today -Refilled his Metformin and Glipizide today

## 2015-03-07 NOTE — Assessment & Plan Note (Signed)
Pain coming in the was of his sleep. No sign of infection or fluid collection. -Gave prescription for tramadol 50 mg three times a day as needed pain #60. No refills -Gave Ibuprofen 600 mg three times a day as needed pain. Advice to take this with food

## 2015-03-11 ENCOUNTER — Encounter: Payer: Self-pay | Admitting: Student

## 2015-03-13 ENCOUNTER — Telehealth: Payer: Self-pay | Admitting: Student

## 2015-03-13 DIAGNOSIS — E118 Type 2 diabetes mellitus with unspecified complications: Secondary | ICD-10-CM

## 2015-03-13 DIAGNOSIS — Z794 Long term (current) use of insulin: Principal | ICD-10-CM

## 2015-03-13 MED ORDER — INSULIN GLARGINE 100 UNIT/ML ~~LOC~~ SOLN: 39.0000 [IU] | Freq: Every day | SUBCUTANEOUS | Status: DC

## 2015-03-13 NOTE — Telephone Encounter (Signed)
Called patient via pacific interpretor, ID # [phone] to discuss his A1c which has increased from 7.7 to 8.8 over the last 3 months. He reaffirms using his medication as prescribed. He says he wasn't feel well on Monday (4 days ago). He was shaky. his blood sugar was about 300. He also reports having some toe infection at the same time. He denies fever. He says he tried to call the clinic to be seen but couldn't get an appointment. So he decided to increase his insulin to 35 units. He says his blood sugar was 280 the following day. He also reports feeling well. He reports his frustration with scheduling as it is hard to get someone who speaks spanish. I have advised him to increase his insulin to 39 units daily starting tonight. He hasn't used his insulin for today yet. I also advised him to continue taking his other medications. I have scheduled him to see me on  03/18/2015 at 3:30 pm for his diabetes and his foot. I have advised him to check his blood sugar every morning and bring the numbers to that visit. He repeated back the plan to me and agreed to do as advised. He appreciated the call as well.

## 2015-03-18 ENCOUNTER — Encounter: Payer: Self-pay | Admitting: Student

## 2015-03-18 ENCOUNTER — Ambulatory Visit (INDEPENDENT_AMBULATORY_CARE_PROVIDER_SITE_OTHER): Payer: No Typology Code available for payment source | Admitting: Student

## 2015-03-18 VITALS — BP 126/68 | HR 79 | Temp 97.8°F | Ht 64.0 in | Wt 177.2 lb

## 2015-03-18 DIAGNOSIS — Z794 Long term (current) use of insulin: Secondary | ICD-10-CM

## 2015-03-18 DIAGNOSIS — E1149 Type 2 diabetes mellitus with other diabetic neurological complication: Secondary | ICD-10-CM

## 2015-03-18 DIAGNOSIS — E118 Type 2 diabetes mellitus with unspecified complications: Secondary | ICD-10-CM

## 2015-03-18 DIAGNOSIS — L97511 Non-pressure chronic ulcer of other part of right foot limited to breakdown of skin: Secondary | ICD-10-CM

## 2015-03-18 DIAGNOSIS — R21 Rash and other nonspecific skin eruption: Secondary | ICD-10-CM

## 2015-03-18 LAB — GLUCOSE, CAPILLARY: Glucose-Capillary: 299 mg/dL — ABNORMAL HIGH (ref 65–99)

## 2015-03-18 MED ORDER — INSULIN GLARGINE 100 UNIT/ML ~~LOC~~ SOLN: 42.0000 [IU] | Freq: Every day | SUBCUTANEOUS | Status: DC

## 2015-03-18 NOTE — Assessment & Plan Note (Signed)
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 Improved from prior.

## 2015-03-18 NOTE — Patient Instructions (Addendum)
It was great seeing you today! We have addressed the following issues today   Diabetes: I have increased your insulin to 42 units before bedtime. Continue writing you blood sugar first thing in the morning before you eat your breakfast. I also recommend checking you blood sugar before your lunch two to three times a week. I would like to write them down. I will follow up with you over the phone in one or two weeks on this.  Drainage from your foot: this doesn't look infected right now. I recommend keeping it clean. This will improve with better control of your diabetes.    If we did any lab work today, and the results require attention, either me or my nurse will get in touch with you. If everything is normal, you will get a letter in mail. If you don't hear from [place] in two weeks, please give [place] a call. Otherwise, I look forward to talking with you again at our next visit. If you have any questions or concerns before then, please call the clinic at [phone].  Please bring all your medications to every doctors visit   Sign up for My Chart to have easy access to your labs results, and communication with your Primary care physician.    Please check-out at the front desk before leaving the clinic.   Take Care,   Diabetes y Doroteo Glassman fsica (Diabetes and Exercise) Hacer actividad fsica con regularidad es muy importante. No se trata solo de [employer]. Tiene muchos otros beneficios, como por ejemplo:  Mejorar el estado fsico, la flexibilidad y la resistencia.  Aumenta la densidad sea.  Ayuda a Art gallery manager.  Disminuye la Art gallery manager.  Aumenta la fuerza muscular.  Reduce el estrs y las tensiones.  Mejora el estado de salud general. Las personas diabticas que realizan actividad fsica tienen beneficios adicionales debido al ejercicio:  Reduce el apetito.  El organismo mejora el uso del azcar (glucosa) de la [place].  Ayuda a disminuir o Scientist, product/process development.  Disminuye la presin arterial.  Ayuda a disminuir los lpidos en la sangre (colesterol y triglicridos).  El organismo mejora el uso de la insulina porque:  Aumenta la sensibilidad del organismo a la insulina.  Reduce las necesidades de insulina del organismo.  Disminuye el riesgo de enfermedad cardaca por la actividad fsica ya que  disminuye el colesterol y [employer] triglicridos.  Aumenta los niveles de colesterol bueno (como las lipoprotenas de alta densidad [HDL]) en el organismo.  Disminuye los niveles de glucosa en la [employer]. SU PLAN DE ACTIVIDAD  Elija una actividad que disfrute y establezca objetivos realistas. Para ejercitarse sin riesgos, debe comenzar a Education administrator cualquier actividad fsica nueva lentamente y aumentar la intensidad del ejercicio de forma gradual con el tiempo. Su mdico o educador en diabetes podrn ayudarlo a crear un plan de actividades que lo beneficie. Las recomendaciones generales incluyen lo siguiente:  Air cabin crew a los nios para que realicen al menos 60 minutos de actividad fsica Management consultant.  Estirarse y Education officer, environmental ejercicios de entrenamiento de la fuerza, como yoga o levantamiento de pesas, por lo menos 2 veces por semana.  Realizar en total por lo menos 150 minutos de ejercicios de intensidad moderada cada semana, como caminar a paso ligero o hacer gimnasia acutica.  Hacer ejercicio fsico por lo menos 3 das por semana y no dejar pasar ms de 2 das seguidos sin ejercitarse.  Evitar los perodos largos de inactividad (90 minutos  o ms tiempo). Cuando deba pasar mucho tiempo sentado, haga pausas frecuentes para caminar o estirarse. RECOMENDACIONES PARA REALIZAR EJERCICIOS CUANDO SE TIENE DIABETES TIPO 1 O TIPO 2   Controle la glucosa en la sangre antes de comenzar. Si el nivel de glucosa en la sangre es de ms de 240 mg/dl, controle las cetonas en la [place]. No haga actividad fsica si hay cetonas.  Evite inyectarse insulina en las  zonas del cuerpo que ejercitar. Por ejemplo, evite inyectarse insulina en:  Los brazos, si juega al tenis.  Las piernas, si corre.  Lleve un registro de:  Los alimentos que consume antes y despus de Tour manager.  Los momentos esperables de picos de accin de la insulina.  Los niveles de glucosa en la sangre antes y despus de hacer ejercicios.  El tipo y cantidad de [place] fsica que Biomedical engineer.  Revise los registros con su mdico. El mdico lo ayudar a Environmental education officer pautas para ajustar la cantidad de alimento y las cantidades de insulina antes y despus de Radio producer ejercicios.  Si toma insulina o agentes hipoglucemiantes por va oral, observe si hay signos y sntomas de hipoglucemia. Entre los que se incluyen:  Mareos.  Temblores.  Sudoracin.  Escalofros.  Confusin.  Beba gran cantidad de agua mientras hace ejercicios para evitar la deshidratacin o los golpes de Airline pilot. Durante la actividad fsica se pierde agua corporal que se debe reponer.  Comente con su mdico antes de comenzar un programa de actividad fsica para verificar que sea seguro para usted. Recuerde, cualquier actividad es mejor que ninguna.   Esta informacin no tiene Theme park manager el consejo del mdico. Asegrese de hacerle al mdico cualquier pregunta que tenga.   Document Released: 01/31/2007 Document Revised: 05/28/2014 Elsevier Interactive Patient Education 2016 [employer].   La diabetes mellitus y los alimentos (Diabetes Mellitus and Food) Es importante que controle su nivel de azcar en la sangre (glucosa). El nivel de glucosa en sangre depende en gran medida de lo que usted come. Comer alimentos saludables en las cantidades [place] a lo largo del Futures trader, aproximadamente a la misma hora [employer], lo ayudar a Chief Operating Officer su nivel de Event organiser. Tambin puede ayudarlo a retrasar o Fish farm manager de la diabetes mellitus. Comer de [employer] saludable incluso puede ayudarlo  a Event organiser de presin arterial y a Barista o Pharmacologist un peso saludable.  Entre las recomendaciones generales para alimentarse y Water quality scientist los alimentos de forma saludable, se incluyen las siguientes:  Respetar las comidas principales y comer colaciones con regularidad. Evitar pasar largos perodos sin comer con el fin de perder peso.  Seguir una dieta que consista principalmente en alimentos de origen vegetal, como frutas, vegetales, frutos secos, legumbres y cereales integrales.  Utilizar mtodos de coccin a baja temperatura, como hornear, en lugar de mtodos de coccin a alta temperatura, como frer en abundante aceite. Trabaje con el nutricionista para aprender a Acupuncturist nutricional de las etiquetas de los alimentos. CMO PUEDEN AFECTARME LOS ALIMENTOS? Carbohidratos Los carbohidratos afectan el nivel de glucosa en sangre ms que cualquier otro tipo de alimento. El nutricionista lo ayudar a Chief Strategy Officer cuntos carbohidratos puede consumir en cada comida y ensearle a contarlos. El recuento de carbohidratos es importante para mantener la glucosa en sangre en un nivel saludable, en especial si utiliza insulina o toma determinados medicamentos para la diabetes mellitus. Alcohol El alcohol puede provocar disminuciones sbitas de la glucosa en sangre (hipoglucemia), en especial si utiliza insulina  o toma determinados medicamentos para la diabetes mellitus. La hipoglucemia es una afeccin que puede poner en peligro la vida. Los sntomas de la hipoglucemia (somnolencia, mareos y Administrator) son similares a los sntomas de haber consumido mucho alcohol.  Si el mdico lo autoriza a beber alcohol, hgalo con moderacin y siga estas pautas:  Las mujeres no deben beber ms de un trago por da, y los hombres no deben beber ms de dos tragos por Futures trader. Un trago es igual a:  12 onzas (355 ml) de cerveza  5 onzas de vino (150 ml) de vino  1,5onzas (45ml) de bebidas  espirituosas  No beba con el estmago vaco.  Mantngase hidratado. Beba agua, gaseosas dietticas o t helado sin azcar.  Las gaseosas comunes, los jugos y otros refrescos podran contener muchos carbohidratos y se Heritage manager. QU ALIMENTOS NO SE RECOMIENDAN? Cuando haga las elecciones de alimentos, es importante que recuerde que todos los alimentos son distintos. Algunos tienen menos nutrientes que otros por porcin, aunque podran tener la misma cantidad de caloras o carbohidratos. Es difcil darle al cuerpo lo que necesita cuando consume alimentos con menos nutrientes. Estos son algunos ejemplos de alimentos que debera evitar ya que contienen muchas caloras y carbohidratos, pero pocos nutrientes:  Neurosurgeon trans (la mayora de los alimentos procesados incluyen grasas trans en la etiqueta de Informacin nutricional).  Gaseosas comunes.  Jugos.  Caramelos.  Dulces, como tortas, pasteles, rosquillas y [place].  Comidas fritas. QU ALIMENTOS PUEDO COMER? Consuma alimentos ricos en nutrientes, que nutrirn el cuerpo y lo mantendrn saludable. Los alimentos que debe comer tambin dependern de varios factores, como:  Las caloras que necesita.  Los medicamentos que toma.  Su peso.  El nivel de glucosa en [place].  El [place] de presin arterial.  El nivel de colesterol. Debe consumir una amplia variedad de alimentos, por ejemplo:  Protenas.  Cortes de [employer].  Protenas con bajo contenido de grasas saturadas, como pescado, clara de huevo y frijoles. Evite las carnes procesadas.  Frutas y vegetales.  Frutas y Sports administrator que pueden ayudar a Chief Operating Officer los niveles sanguneos de [place], como [place], mangos y batatas.  Productos lcteos.  Elija productos lcteos sin grasa o con bajo contenido de [place], como [place], yogur y [place].  Cereales, panes, pastas y arroz.  Elija cereales integrales, como panes multicereales, avena en grano y arroz integral. Estos  alimentos pueden ayudar a controlar la presin arterial.  Rosalin Hawking.  Alimentos que contengan grasas saludables, como frutos secos, Chartered certified accountant, aceite de [place], aceite de [place] y pescado. TODOS LOS QUE PADECEN DIABETES MELLITUS TIENEN EL MISMO PLAN DE COMIDAS? Dado que todas las personas que padecen diabetes mellitus son distintas, no hay un solo plan de comidas que funcione para todos. Es muy importante que se rena con un nutricionista que lo ayudar a crear un plan de comidas adecuado para usted.   Esta informacin no tiene Theme park manager el consejo del mdico. Asegrese de hacerle al mdico cualquier pregunta que tenga.   Document Released: 04/20/2007 Document Revised: 02/01/2014 Elsevier Interactive Patient Education [employer]

## 2015-03-18 NOTE — Progress Notes (Signed)
   Subjective:    Patient ID: Mitchell [name], male    DOB: 05-12-1975, 40 y.o.   MRN: [id number]  HPI  Video interpretor was used for this encounter. Diabetes: FBGs in the range of 202 to 248 over the last 5 days after increasing his Lantus from 35 units to 39 units. POCT glucose 299 today. Reports drinking a lot of water (8-10 bottles of water) and polyuria. Denies polyphagia but reports eating 6 times a day. Denies drinking soda. Reports drinking diluted juice. Meal diary from yesterday:  Breakfast: fish with some rice  Snack: apple and grape  Lunch: lettuce and asparagus with mashed potato  Snack: 1/2 banana and some yogurt  Dinner: plain chicken  Drainage from second toe: for one week. Drains pus. Tender to touch. Fever and chills at night last night but didn't check his temperature. Didn't take medication. Reports foot swelling but that has improved with ice packs.   ROS  Per HPI Objective:   Physical Exam Filed Vitals:   03/18/15 1551  BP: 126/68  Pulse: 79  Temp: 97.8 F (36.6 C)  TempSrc: Oral  Height:  (1.626 m)  Weight: 177 lb 3.2 oz (80.377 kg)   Gen: appears well, no acute distress CV: regular rate and rythm. S1 & S2 audible Resp: no apparent work of breathing, clear to auscultation bilaterally. Skin: some red papules on his forearms and legs. Improved from prior. MSK:   -RLE with contracture scars below his mid shin. Missing first toe. About 4 mm open wound over the dorsum of his second toe over distal interphalengeal joint. Dressed with bandage. No apparent drainage. No surrounding skin erythema or fluid loculation. Some bleeding when I removed a bandage for exam. Applied a new bandage that stopped the bleeding.   -LLE: normal Neuro: alert and oriented, no gross deficit    Assessment & Plan:  Diabetes mellitus type 2 with complications Not controlled. Increased his Lantus from 39 units to 42 units today. Advised to check and record his FBG daily and BG  before lunch three times a week. Will follow up in two weeks over the phone. Advised to call clinic if blood sugar over 400. Will continue his metformin and glipizide as they are. Discussed about hypoglycemia.Gave handout in spanish about diet and exercise in diabetes.   Ulcer of right foot A 4 mm superficial wound over the dorsal aspect of distal interphalengeal joint on his second toe. No surrounding skin erythema or fluid loculation. No pus drainage when squeezed. Some bleeding after remove bandage. Unlikely for oesteomyelitis. Anticipate improvement with improvement in blood glucose. Increased his lantus to 42 units today. Advised to clean with luke water and soap. Advised wearing open shoes when at home. Discussed return precautions such as surrounding skin redness, pus drainage, fever, chills, wound getting bigger. Will follow up with phone in two weeks.  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 Improved from prior.

## 2015-03-18 NOTE — Assessment & Plan Note (Signed)
A 4 mm superficial wound over the dorsal aspect of distal interphalengeal joint on his second toe. No surrounding skin erythema or fluid loculation. No pus drainage when squeezed. Some bleeding after remove bandage. Unlikely for oesteomyelitis. Anticipate improvement with improvement in blood glucose. Increased his lantus to 42 units today. Advised to clean with luke water and soap. Advised wearing open shoes when at home. Discussed return precautions such as surrounding skin redness, pus drainage, fever, chills, wound getting bigger. Will follow up with phone in two weeks.

## 2015-03-18 NOTE — Assessment & Plan Note (Signed)
Not controlled. Increased his Lantus from 39 units to 42 units today. Advised to check and record his FBG daily and BG before lunch three times a week. Will follow up in two weeks over the phone. Advised to call clinic if blood sugar over 400. Will continue his metformin and glipizide as they are. Discussed about hypoglycemia.Gave handout in spanish about diet and exercise in diabetes.

## 2015-04-09 ENCOUNTER — Telehealth: Payer: Self-pay | Admitting: Student

## 2015-04-09 DIAGNOSIS — E118 Type 2 diabetes mellitus with unspecified complications: Secondary | ICD-10-CM

## 2015-04-09 DIAGNOSIS — Z794 Long term (current) use of insulin: Principal | ICD-10-CM

## 2015-04-09 MED ORDER — INSULIN GLARGINE 100 UNIT/ML ~~LOC~~ SOLN: 44.0000 [IU] | Freq: Every day | SUBCUTANEOUS | Status: DC

## 2015-04-09 NOTE — Telephone Encounter (Signed)
Called from my cell phone using phone interpretor, [phone] to follow up on his diabetes. He records his blood sugar levels but didn't have the paper with him as he is not at home now. He says his FBG is 162 this morning. Highest number from memory 238 last week. Lowest 110-115. He is on Lantus 42 units at bedtime. He says he felt less tired when his blood sugar was low. Asked if we can increase his Lantus to 44 units. He agreed to this.  Wound on his right foot about the same as when I saw him last per his report. Advised to call office if it gets worse or if he has fever.

## 2015-06-13 ENCOUNTER — Encounter: Payer: Self-pay | Admitting: Student

## 2015-06-13 ENCOUNTER — Ambulatory Visit (INDEPENDENT_AMBULATORY_CARE_PROVIDER_SITE_OTHER): Payer: No Typology Code available for payment source | Admitting: Student

## 2015-06-13 VITALS — BP 116/83 | HR 79 | Wt 180.0 lb

## 2015-06-13 DIAGNOSIS — E118 Type 2 diabetes mellitus with unspecified complications: Secondary | ICD-10-CM

## 2015-06-13 DIAGNOSIS — M79671 Pain in right foot: Secondary | ICD-10-CM

## 2015-06-13 DIAGNOSIS — M25561 Pain in right knee: Secondary | ICD-10-CM

## 2015-06-13 DIAGNOSIS — Z794 Long term (current) use of insulin: Secondary | ICD-10-CM

## 2015-06-13 LAB — POCT GLYCOSYLATED HEMOGLOBIN (HGB A1C): HEMOGLOBIN A1C: 8.9

## 2015-06-13 MED ORDER — INSULIN GLARGINE 100 UNIT/ML ~~LOC~~ SOLN: 44.0000 [IU] | Freq: Every day | SUBCUTANEOUS | Status: DC

## 2015-06-13 MED ORDER — GABAPENTIN 300 MG PO CAPS: 300.0000 mg | ORAL_CAPSULE | Freq: Three times a day (TID) | ORAL | Status: DC

## 2015-06-13 MED ORDER — TRAMADOL HCL 50 MG PO TABS: 50.0000 mg | ORAL_TABLET | Freq: Three times a day (TID) | ORAL | Status: DC | PRN
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
## 2015-06-13 NOTE — Patient Instructions (Signed)
It was great seeing you today! We have addressed the following issues today  1. Left foot pain: this is likely related to your diabetes. I have low suspicion for infection. I have given your prescription for tramadol and gabapentin.  2. Diabetes: your A1c has slightly gone up from 8.8 to 8.9 today. We have given your two vials of insulin. We would like you to fill the form we gave you and go to medical assistance program at health department for assistance with your medications.    If we did any lab work today, and the results require attention, either me or my nurse will get in touch with you. If everything is normal, you will get a letter in mail. If you don't hear from us in two weeks, please give us a call. Otherwise, I look forward to talking with you again at our next visit. If you have any questions or concerns before then, please call the clinic at [phone].  Please bring all your medications to every doctors visit   Sign up for My Chart to have easy access to your labs results, and communication with your Primary care physician.    Please check-out at the front desk before leaving the clinic.   Take Care,

## 2015-06-13 NOTE — Assessment & Plan Note (Signed)
Poorly-controlled.  A1c 8.9 (8.8 three months ago). Has been out of his insulin for a week due to financial issue. He has orange card but has been going to [facility].  -Gave two vials of Lantus today from our clinic today -Gave him information about MAP.
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
## 2015-06-14 ENCOUNTER — Encounter (HOSPITAL_COMMUNITY): Payer: Self-pay

## 2015-06-14 ENCOUNTER — Emergency Department (HOSPITAL_COMMUNITY): Payer: Medicaid Other

## 2015-06-14 ENCOUNTER — Inpatient Hospital Stay (HOSPITAL_COMMUNITY)
Admission: EM | Admit: 2015-06-14 | Discharge: 2015-06-16 | DRG: 603 | Disposition: A | Discharge status: Home / Self Care | Payer: Medicaid Other | Attending: Family Medicine | Admitting: Family Medicine

## 2015-06-14 DIAGNOSIS — Z89411 Acquired absence of right great toe: Secondary | ICD-10-CM | POA: Diagnosis not present

## 2015-06-14 DIAGNOSIS — Z794 Long term (current) use of insulin: Secondary | ICD-10-CM

## 2015-06-14 DIAGNOSIS — M79673 Pain in unspecified foot: Secondary | ICD-10-CM | POA: Diagnosis present

## 2015-06-14 DIAGNOSIS — L02611 Cutaneous abscess of right foot: Principal | ICD-10-CM | POA: Insufficient documentation

## 2015-06-14 DIAGNOSIS — E1169 Type 2 diabetes mellitus with other specified complication: Secondary | ICD-10-CM | POA: Diagnosis not present

## 2015-06-14 DIAGNOSIS — D72819 Decreased white blood cell count, unspecified: Secondary | ICD-10-CM | POA: Diagnosis present

## 2015-06-14 DIAGNOSIS — Z79899 Other long term (current) drug therapy: Secondary | ICD-10-CM | POA: Diagnosis not present

## 2015-06-14 DIAGNOSIS — K219 Gastro-esophageal reflux disease without esophagitis: Secondary | ICD-10-CM | POA: Diagnosis present

## 2015-06-14 DIAGNOSIS — B86 Scabies: Secondary | ICD-10-CM | POA: Diagnosis not present

## 2015-06-14 DIAGNOSIS — E1165 Type 2 diabetes mellitus with hyperglycemia: Secondary | ICD-10-CM | POA: Diagnosis not present

## 2015-06-14 DIAGNOSIS — L0291 Cutaneous abscess, unspecified: Secondary | ICD-10-CM | POA: Diagnosis present

## 2015-06-14 DIAGNOSIS — S91301A Unspecified open wound, right foot, initial encounter: Secondary | ICD-10-CM | POA: Diagnosis not present

## 2015-06-14 DIAGNOSIS — E1140 Type 2 diabetes mellitus with diabetic neuropathy, unspecified: Secondary | ICD-10-CM | POA: Diagnosis present

## 2015-06-14 DIAGNOSIS — K7460 Unspecified cirrhosis of liver: Secondary | ICD-10-CM | POA: Diagnosis not present

## 2015-06-14 DIAGNOSIS — D696 Thrombocytopenia, unspecified: Secondary | ICD-10-CM | POA: Diagnosis not present

## 2015-06-14 DIAGNOSIS — F329 Major depressive disorder, single episode, unspecified: Secondary | ICD-10-CM | POA: Diagnosis not present

## 2015-06-14 DIAGNOSIS — K7010 Alcoholic hepatitis without ascites: Secondary | ICD-10-CM | POA: Diagnosis not present

## 2015-06-14 LAB — BASIC METABOLIC PANEL
Anion gap: 11 (ref 5–15)
BUN: 11 mg/dL (ref 6–20)
CALCIUM: 9.1 mg/dL (ref 8.9–10.3)
CO2: 23 mmol/L (ref 22–32)
CREATININE: 0.85 mg/dL (ref 0.61–1.24)
Chloride: 99 mmol/L — ABNORMAL LOW (ref 101–111)
GFR calc Af Amer: >60 mL/min (ref >60)
GFR calc non Af Amer: >60 mL/min (ref >60)
GLUCOSE: 401 mg/dL — AB (ref 65–99)
Potassium: 3.7 mmol/L (ref 3.5–5.1)
Sodium: 133 mmol/L — ABNORMAL LOW (ref 135–145)

## 2015-06-14 LAB — CBC WITH DIFFERENTIAL/PLATELET
BASOS PCT: 1 %
Basophils Absolute: 0 10*3/uL (ref 0.0–0.1)
Eosinophils Absolute: 0.1 10*3/uL (ref 0.0–0.7)
Eosinophils Relative: 3 %
HEMATOCRIT: 43.5 % (ref 39.0–52.0)
Hemoglobin: 16.1 g/dL (ref 13.0–17.0)
LYMPHS PCT: 31 %
Lymphs Abs: 1.1 10*3/uL (ref 0.7–4.0)
MCH: 32.5 pg (ref 26.0–34.0)
MCHC: 37 g/dL — AB (ref 30.0–36.0)
MCV: 87.7 fL (ref 78.0–100.0)
MONO ABS: 0.3 10*3/uL (ref 0.1–1.0)
MONOS PCT: 9 %
NEUTROS ABS: 2 10*3/uL (ref 1.7–7.7)
Neutrophils Relative %: 57 %
Platelets: 75 10*3/uL — ABNORMAL LOW (ref 150–400)
RBC: 4.96 MIL/uL (ref 4.22–5.81)
RDW: 13.1 % (ref 11.5–15.5)
WBC: 3.6 10*3/uL — ABNORMAL LOW (ref 4.0–10.5)

## 2015-06-14 MED ORDER — CLINDAMYCIN PHOSPHATE 600 MG/50ML IV SOLN
600.0000 mg | Freq: Once | INTRAVENOUS | Status: AC
  Administered 2015-06-15: 600 mg via INTRAVENOUS
  Filled 2015-06-14: qty 50

## 2015-06-14 MED ORDER — GADOBENATE DIMEGLUMINE 529 MG/ML IV SOLN
15.0000 mL | Freq: Once | INTRAVENOUS | Status: AC | PRN
  Administered 2015-06-14: 15 mL via INTRAVENOUS

## 2015-06-14 MED ORDER — MORPHINE SULFATE (PF) 4 MG/ML IV SOLN
4.0000 mg | Freq: Once | INTRAVENOUS | Status: AC
Start: 2015-06-14 — End: 2015-06-14
  Administered 2015-06-14: 4 mg via INTRAVENOUS
  Filled 2015-06-14: qty 1

## 2015-06-14 MED ORDER — SODIUM CHLORIDE 0.9 % IV BOLUS (SEPSIS)
1000.0000 mL | Freq: Once | INTRAVENOUS | Status: AC
  Administered 2015-06-15: 1000 mL via INTRAVENOUS
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
## 2015-06-15 DIAGNOSIS — Z794 Long term (current) use of insulin: Secondary | ICD-10-CM | POA: Diagnosis not present

## 2015-06-15 DIAGNOSIS — L0291 Cutaneous abscess, unspecified: Secondary | ICD-10-CM

## 2015-06-15 DIAGNOSIS — E1140 Type 2 diabetes mellitus with diabetic neuropathy, unspecified: Secondary | ICD-10-CM | POA: Diagnosis present

## 2015-06-15 DIAGNOSIS — E1169 Type 2 diabetes mellitus with other specified complication: Secondary | ICD-10-CM | POA: Diagnosis present

## 2015-06-15 DIAGNOSIS — Z89411 Acquired absence of right great toe: Secondary | ICD-10-CM | POA: Diagnosis not present

## 2015-06-15 DIAGNOSIS — M79673 Pain in unspecified foot: Secondary | ICD-10-CM | POA: Insufficient documentation

## 2015-06-15 DIAGNOSIS — F329 Major depressive disorder, single episode, unspecified: Secondary | ICD-10-CM | POA: Diagnosis present

## 2015-06-15 DIAGNOSIS — K7010 Alcoholic hepatitis without ascites: Secondary | ICD-10-CM | POA: Diagnosis present

## 2015-06-15 DIAGNOSIS — Z79899 Other long term (current) drug therapy: Secondary | ICD-10-CM | POA: Diagnosis not present

## 2015-06-15 DIAGNOSIS — L02611 Cutaneous abscess of right foot: Secondary | ICD-10-CM | POA: Insufficient documentation

## 2015-06-15 DIAGNOSIS — E1165 Type 2 diabetes mellitus with hyperglycemia: Secondary | ICD-10-CM | POA: Diagnosis present

## 2015-06-15 DIAGNOSIS — S91301A Unspecified open wound, right foot, initial encounter: Secondary | ICD-10-CM | POA: Diagnosis present

## 2015-06-15 DIAGNOSIS — D696 Thrombocytopenia, unspecified: Secondary | ICD-10-CM | POA: Diagnosis present

## 2015-06-15 DIAGNOSIS — B86 Scabies: Secondary | ICD-10-CM | POA: Diagnosis present

## 2015-06-15 DIAGNOSIS — K219 Gastro-esophageal reflux disease without esophagitis: Secondary | ICD-10-CM | POA: Diagnosis present

## 2015-06-15 DIAGNOSIS — K7460 Unspecified cirrhosis of liver: Secondary | ICD-10-CM | POA: Diagnosis present

## 2015-06-15 DIAGNOSIS — D72819 Decreased white blood cell count, unspecified: Secondary | ICD-10-CM | POA: Diagnosis present

## 2015-06-15 DIAGNOSIS — M79671 Pain in right foot: Secondary | ICD-10-CM

## 2015-06-15 LAB — GLUCOSE, CAPILLARY
GLUCOSE-CAPILLARY: 165 mg/dL — AB (ref 65–99)
Glucose-Capillary: 206 mg/dL — ABNORMAL HIGH (ref 65–99)
Glucose-Capillary: 213 mg/dL — ABNORMAL HIGH (ref 65–99)
Glucose-Capillary: 150 mg/dL — ABNORMAL HIGH (ref 65–99)

## 2015-06-15 LAB — CBC
HEMATOCRIT: 40.6 % (ref 39.0–52.0)
Hemoglobin: 14.8 g/dL (ref 13.0–17.0)
MCH: 31.9 pg (ref 26.0–34.0)
MCHC: 36.5 g/dL — ABNORMAL HIGH (ref 30.0–36.0)
MCV: 87.5 fL (ref 78.0–100.0)
Platelets: 70 10*3/uL — ABNORMAL LOW (ref 150–400)
RBC: 4.64 MIL/uL (ref 4.22–5.81)
RDW: 13.2 % (ref 11.5–15.5)
WBC: 3.1 10*3/uL — AB (ref 4.0–10.5)

## 2015-06-15 LAB — PROTIME-INR
INR: 1.26 (ref 0.00–1.49)
Prothrombin Time: 15.9 seconds — ABNORMAL HIGH (ref 11.6–15.2)

## 2015-06-15 LAB — COMPREHENSIVE METABOLIC PANEL
ALBUMIN: 3.3 g/dL — AB (ref 3.5–5.0)
ALK PHOS: 68 U/L (ref 38–126)
ALT: 38 U/L (ref 17–63)
ANION GAP: 9 (ref 5–15)
AST: 35 U/L (ref 15–41)
BUN: 10 mg/dL (ref 6–20)
CALCIUM: 8.3 mg/dL — AB (ref 8.9–10.3)
CO2: 24 mmol/L (ref 22–32)
Chloride: 105 mmol/L (ref 101–111)
Creatinine, Ser: 0.7 mg/dL (ref 0.61–1.24)
GFR calc Af Amer: >60 mL/min (ref >60)
GFR calc non Af Amer: >60 mL/min (ref >60)
GLUCOSE: 186 mg/dL — AB (ref 65–99)
POTASSIUM: 3.6 mmol/L (ref 3.5–5.1)
SODIUM: 138 mmol/L (ref 135–145)
Total Bilirubin: 0.9 mg/dL (ref 0.3–1.2)
Total Protein: 7 g/dL (ref 6.5–8.1)

## 2015-06-15 LAB — SEDIMENTATION RATE: Sed Rate: 7 mm/hr (ref 0–16)

## 2015-06-15 LAB — TYPE AND SCREEN
ABO/RH(D): A POS
ANTIBODY SCREEN: NEGATIVE

## 2015-06-15 LAB — I-STAT CG4 LACTIC ACID, ED: LACTIC ACID, VENOUS: 1.28 mmol/L (ref 0.5–2.0)

## 2015-06-15 MED ORDER — TRAMADOL HCL 50 MG PO TABS
50.0000 mg | ORAL_TABLET | Freq: Two times a day (BID) | ORAL | Status: DC | PRN
  Administered 2015-06-15 (×2): 50 mg via ORAL
  Filled 2015-06-15 (×2): qty 1

## 2015-06-15 MED ORDER — PANTOPRAZOLE SODIUM 40 MG PO TBEC
40.0000 mg | DELAYED_RELEASE_TABLET | Freq: Every day | ORAL | Status: DC
  Administered 2015-06-15 – 2015-06-16 (×2): 40 mg via ORAL
  Filled 2015-06-15 (×2): qty 1

## 2015-06-15 MED ORDER — FOLIC ACID 1 MG PO TABS
1.0000 mg | ORAL_TABLET | Freq: Every day | ORAL | Status: DC
Start: 2015-06-15 — End: 2015-06-16
  Administered 2015-06-15 – 2015-06-16 (×2): 1 mg via ORAL
  Filled 2015-06-15 (×2): qty 1

## 2015-06-15 MED ORDER — INSULIN ASPART 100 UNIT/ML ~~LOC~~ SOLN
0.0000 [IU] | Freq: Three times a day (TID) | SUBCUTANEOUS | Status: DC
Start: 2015-06-15 — End: 2015-06-16
  Administered 2015-06-15: 5 [IU] via SUBCUTANEOUS
  Administered 2015-06-15: 3 [IU] via SUBCUTANEOUS
  Administered 2015-06-16: 5 [IU] via SUBCUTANEOUS
  Administered 2015-06-16: 15 [IU] via SUBCUTANEOUS
  Administered 2015-06-16: 8 [IU] via SUBCUTANEOUS

## 2015-06-15 MED ORDER — SODIUM CHLORIDE 0.45 % IV SOLN
INTRAVENOUS | Status: DC
Start: 2015-06-16 — End: 2015-06-16
  Administered 2015-06-16: 03:00:00 via INTRAVENOUS

## 2015-06-15 MED ORDER — VITAMIN B-1 100 MG PO TABS
100.0000 mg | ORAL_TABLET | Freq: Every day | ORAL | Status: DC
  Administered 2015-06-15 – 2015-06-16 (×2): 100 mg via ORAL
  Filled 2015-06-15 (×2): qty 1

## 2015-06-15 MED ORDER — CEFAZOLIN SODIUM-DEXTROSE 2-4 GM/100ML-% IV SOLN
2.0000 g | INTRAVENOUS | Status: DC
  Filled 2015-06-15: qty 100

## 2015-06-15 MED ORDER — POLYETHYLENE GLYCOL 3350 17 G PO PACK: 17.0000 g | PACK | Freq: Every day | ORAL | Status: DC | PRN

## 2015-06-15 MED ORDER — INSULIN ASPART 100 UNIT/ML ~~LOC~~ SOLN: 0.0000 [IU] | Freq: Every day | SUBCUTANEOUS | Status: DC

## 2015-06-15 MED ORDER — LORAZEPAM 2 MG/ML IJ SOLN: 1.0000 mg | Freq: Four times a day (QID) | INTRAMUSCULAR | Status: DC | PRN

## 2015-06-15 MED ORDER — ACETAMINOPHEN 325 MG PO TABS: 650.0000 mg | ORAL_TABLET | Freq: Four times a day (QID) | ORAL | Status: DC | PRN

## 2015-06-15 MED ORDER — THIAMINE HCL 100 MG/ML IJ SOLN: 100.0000 mg | Freq: Every day | INTRAMUSCULAR | Status: DC

## 2015-06-15 MED ORDER — SODIUM CHLORIDE 0.9 % IV SOLN
INTRAVENOUS | Status: AC
  Administered 2015-06-15: 03:00:00 via INTRAVENOUS

## 2015-06-15 MED ORDER — LORAZEPAM 1 MG PO TABS: 1.0000 mg | ORAL_TABLET | Freq: Four times a day (QID) | ORAL | Status: DC | PRN

## 2015-06-15 MED ORDER — ACETAMINOPHEN 650 MG RE SUPP: 650.0000 mg | Freq: Four times a day (QID) | RECTAL | Status: DC | PRN

## 2015-06-15 MED ORDER — HYDROMORPHONE HCL 1 MG/ML IJ SOLN
1.0000 mg | INTRAMUSCULAR | Status: DC | PRN
  Administered 2015-06-15: 1 mg via INTRAVENOUS
  Filled 2015-06-15: qty 1

## 2015-06-15 MED ORDER — ADULT MULTIVITAMIN W/MINERALS CH
1.0000 | ORAL_TABLET | Freq: Every day | ORAL | Status: DC
  Administered 2015-06-15 – 2015-06-16 (×2): 1 via ORAL
  Filled 2015-06-15 (×2): qty 1

## 2015-06-15 MED ORDER — CLINDAMYCIN PHOSPHATE 600 MG/50ML IV SOLN
600.0000 mg | Freq: Three times a day (TID) | INTRAVENOUS | Status: DC
  Administered 2015-06-15 – 2015-06-16 (×5): 600 mg via INTRAVENOUS
  Filled 2015-06-15 (×8): qty 50

## 2015-06-15 MED ORDER — INSULIN GLARGINE 100 UNIT/ML ~~LOC~~ SOLN
40.0000 [IU] | Freq: Every day | SUBCUTANEOUS | Status: DC
  Administered 2015-06-15 (×2): 40 [IU] via SUBCUTANEOUS
  Filled 2015-06-15 (×3): qty 0.4

## 2015-06-15 NOTE — ED Notes (Signed)
Attempted report 
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
## 2015-06-16 ENCOUNTER — Encounter (HOSPITAL_COMMUNITY)
Admission: EM | Disposition: A | Discharge status: Home / Self Care | Payer: Self-pay | Source: Home / Self Care | Attending: Family Medicine

## 2015-06-16 LAB — GLUCOSE, CAPILLARY
GLUCOSE-CAPILLARY: 363 mg/dL — AB (ref 65–99)
Glucose-Capillary: 224 mg/dL — ABNORMAL HIGH (ref 65–99)
Glucose-Capillary: 266 mg/dL — ABNORMAL HIGH (ref 65–99)

## 2015-06-16 LAB — ABO/RH: ABO/RH(D): A POS

## 2015-06-16 SURGERY — AMPUTATION BELOW KNEE
Anesthesia: General | Laterality: Right

## 2015-06-16 MED ORDER — PERMETHRIN 5 % EX CREA
TOPICAL_CREAM | Freq: Once | CUTANEOUS | Status: AC
  Administered 2015-06-16: 13:00:00 via TOPICAL
  Filled 2015-06-16: qty 60

## 2015-06-16 MED ORDER — DOXYCYCLINE HYCLATE 50 MG PO CAPS: 100.0000 mg | ORAL_CAPSULE | Freq: Two times a day (BID) | ORAL | Status: DC

## 2015-06-16 MED ORDER — PERMETHRIN 5 % EX CREA: 1.0000 "application " | TOPICAL_CREAM | Freq: Once | CUTANEOUS | Status: DC
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
## 2015-06-16 NOTE — Progress Notes (Signed)
Inpatient Diabetes Program Recommendations  AACE/ADA: New Consensus Statement on Inpatient Glycemic Control (2015)  Target Ranges:  Prepandial:   less than 140 mg/dL      Peak postprandial:   less than 180 mg/dL (1-2 hours)      Critically ill patients:  140 - 180 mg/dL   Results for Mitchell [name], Mitchell [name] (MRN [id number]) as of 06/16/2015 11:38  Ref. Range 06/15/2015 11:40 06/15/2015 16:41 06/15/2015 21:41 06/16/2015 06:37 06/16/2015 11:19  Glucose-Capillary Latest Ref Range: 65-99 mg/dL 409165 (H) 811213 (H) 914150 (H) 224 (H) 363 (H)   Review of Glycemic Control  Diabetes history: DM 2 Outpatient Diabetes medications: Lantus 44 units QHS, Glipizide 5 mg BID, Metformin 1,000 mg BID Current orders for Inpatient glycemic control: Lantus 40 units, Novolog Moderate + HS scale  Inpatient Diabetes Program Recommendations: Insulin - Basal: Fasting glucose is over 200 mg/dl. Please increase basal insulin to haome dose, Lantus 44 units. May also need Novolog 3 units TID meal coverage.  Thanks,  Christena DeemShannon Dorisann Schwanke RN, MSN, [facility] Inpatient Diabetes Coordinator Team Pager [phone] (8a-5p)

## 2015-06-16 NOTE — Care Management Note (Signed)
Case Management Note  Patient Details  Name: Mitchell [name] MRN: [id number] Date of Birth: 03/08/1975  Subjective/Objective:         Admitted with right foot abscess           Action/Plan: Patient given MATCH letter and list of pharmacies per Spanish interpreter. Discharging home with family.   Expected Discharge Date:                  Expected Discharge Plan:  Home/Self Care  In-House Referral:  Artistinancial Counselor, Interpreting Services  Discharge planning Services  [facility]  Post Acute Care Choice:  NA Choice offered to:  NA  DME Arranged:  N/A DME Agency:     HH Arranged:  NA HH Agency:     Status of Service:  Completed, signed off  Medicare Important Message Given:    Date Medicare IM Given:    Medicare IM give by:    Date Additional Medicare IM Given:    Additional Medicare Important Message give by:     If discussed at Long Length of Stay Meetings, dates discussed:    Additional Comments:  Monica BectonKrieg, Chiron Campione Watson, RN 06/16/2015, 4:21 PM

## 2015-06-16 NOTE — Progress Notes (Signed)
Pt ready for d/c home per MD. Discharge instructions and prescriptions reviewed with pt and family via spanish interpreter, all their questions answered. Belongings were packed and waiting for NT to assist pt to car for transportation home.   [place], Latricia HeftKorie G

## 2015-06-16 NOTE — Discharge Instructions (Signed)
You were admitted to the hospital for an infection in your right foot. We have given you antibiotics for your infection. Dr. Lajoyce Corners would like you to take the antibiotic Doxycycline and follow up with him at his office. Please call him to schedule an appointment.   You were also found to have a skin infection which is likely scabies. You received treatment in the hospital. Your entire family will need to be treated. I have sent a prescription to the pharmacy for scabies treatment, please have your family use this medication. Read below for more information on scabies.   Please follow up at the appointment times listed above.   It was a pleasure caring for you, take care!  Sarna en los adultos (Scabies, Adult) La sarna es una afeccin cutnea que se produce cuando insectos muy pequeos se introducen debajo de la piel (infestacin). Esto causa una erupcin cutnea y picazn intensa. La sarna puede transmitirse de Neomia Dear persona a otra (es contagiosa). Si una persona tiene sarna, es frecuente que los dems miembros de la familia tambin contraigan la afeccin. Los sntomas suelen desaparecer en el trmino de 2 a 4semanas con el tratamiento adecuado. Por lo general, la sarna no causa problemas crnicos. CAUSAS La causa de esta afeccin son los caros (Sarcoptes scabiei o aradores de la sarna) que pueden verse solamente con un microscopio. Los caros se introducen en la capa superior de la piel y ponen [place]. La sarna puede transmitirse de Neomia Dear persona a otra a travs de lo siguiente:  El contacto cercano con una persona que tiene sarna.  El contacto con objetos infestados, como [place], ropa de [place] o prendas de vestir. FACTORES DE RIESGO Es ms probable que esta afeccin se manifieste en:  Las personas que viven en hogares de ancianos y centros de cuidados prolongados.  Las personas que tienen contacto sexual con un compaero que tiene sarna.  Los nios pequeos que asisten a guarderas.  Las  [employer] cuidan a Economist con un riesgo mayor de Warehouse manager sarna. SNTOMAS Los sntomas de esta afeccin pueden incluir lo siguiente:  Picazn intensa. La picazn generalmente empeora por la noche.  Una erupcin cutnea con pequeos bultos rojos o ampollas. La erupcin cutnea suele aparecer en la mueca, el codo, la axila, los dedos de la mano, la cintura, la ingle o los glteos. Los bultos pueden formar una lnea (surco) en algunas zonas.  Irritacin de la piel. Esta puede incluir lceras o manchas escamosas. DIAGNSTICO Esta afeccin se diagnostica mediante un examen fsico. El mdico le examinar exhaustivamente la piel. En algunos casos, el mdico puede tomar una muestra de la piel afectada (raspado de la piel) y la har examinar con un microscopio. TRATAMIENTO El tratamiento de esta afeccin puede incluir lo siguiente:  Betha Loa o locin con un medicamento para destruir los caros. Este producto se esparce por todo el cuerpo y se deja durante varias horas. Por lo general, un tratamiento con crema o locin con medicamento es suficiente para destruir todos los caros. Cuando los casos son graves, puede que sea necesario repetir Scientist, research (medical).  Crema con medicamento para Associate Professor.  Medicamentos que ayudan a Associate Professor.  Medicamentos que [employer] caros. Este tratamiento se utiliza en contadas ocasiones. INSTRUCCIONES PARA EL CUIDADO EN EL HOGAR Medicamentos  Tome o aplquese los medicamentos de venta libre y los recetados como se lo haya indicado el mdico.  Aplique la crema o locin con medicamento como se lo haya indicado el  mdico.  No enjuague la crema o locin con medicamento hasta tanto haya transcurrido el tiempo necesario. Cuidado de la piel  No se rasque la piel afectada.  Mantenga bien cortas las uas de las manos para reducir las lesiones que se producen al rascarse.  Para aliviar la picazn, tome baos fros o aplquese paos fros en la  piel. Instrucciones generales  Medco Health SolutionsLave todos los TEPPCO Partnersobjetos con los que haya tenido contacto reciente, entre ellos, la ropa de [place], las prendas de vestir y [place]cottlos muebles. Haga esto el mismo da que comience el Birch Hilltratamiento.  Lave los objetos con agua caliente.  Coloque en bolsas de plstico hermticas durante al menos 3das los objetos que no se puedan lavar. Los caros no sobreviven ms de 3das alejados de la Owens-Illinoispiel humana.  Pase la aspiradora por los muebles y los colchones que Weottutiliza.  Asegrese de que un mdico examine a las dems personas que puedan haberse infestado. Esto incluye a los miembros de su familia y a Emergency planning/management officercualquier persona que pueda haber tenido contacto con los objetos infestados.  Concurra a todas las visitas de control como se lo haya indicado el mdico. Esto es importante. SOLICITE ATENCIN MDICA SI:  La picazn no desaparece despus de 4semanas de tratamiento.  Le siguen apareciendo nuevos bultos o surcos.  Tiene enrojecimiento, hinchazn o dolor en la zona de la erupcin cutnea despus del tratamiento.  Observa lquido, sangre o pus que salen de la erupcin cutnea.   Esta informacin no tiene Theme park managercomo fin reemplazar el consejo del mdico. Asegrese de hacerle al mdico cualquier pregunta que tenga.   Document Released: 10/02/2014 Elsevier Interactive Patient Education [employer]2016 Elsevier Inc.
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
## 2015-06-16 NOTE — Progress Notes (Signed)
Patient ID: Mitchell [name], male   DOB: 01/31/1975, 40 y.o.   MRN: [id number] Patient's foot looks much better this morning. There is no fluctuance no redness no cellulitis no open wound. There is no tenderness to palpation. Patient states his foot feels much better.  Assessment resolving infection right foot.  Plan: I discussed with the patient that with his foot deformity and poor circulation that chronic recurrent infections will be a problem. Patient states that he does not have time to take off to proceed with amputation surgery. He states that he is the only one to provide income for the family. Patient wishes to be discharged on oral antibiotics. I will follow-up in the office Thursday or Friday. Surgery is canceled for today.

## 2015-06-20 ENCOUNTER — Ambulatory Visit (INDEPENDENT_AMBULATORY_CARE_PROVIDER_SITE_OTHER): Payer: No Typology Code available for payment source | Admitting: Student

## 2015-06-20 ENCOUNTER — Encounter: Payer: Self-pay | Admitting: Student

## 2015-06-20 VITALS — BP 114/67 | HR 80 | Temp 98.2°F | Wt 177.2 lb

## 2015-06-20 DIAGNOSIS — E118 Type 2 diabetes mellitus with unspecified complications: Secondary | ICD-10-CM

## 2015-06-20 DIAGNOSIS — L02611 Cutaneous abscess of right foot: Secondary | ICD-10-CM

## 2015-06-20 LAB — LIPID PANEL
CHOL/HDL RATIO: 5.9 ratio — AB (ref <=5.0)
CHOLESTEROL: 130 mg/dL (ref 125–200)
HDL: 22 mg/dL — AB (ref >=40)
TRIGLYCERIDES: 479 mg/dL — AB (ref <150)

## 2015-06-20 LAB — CULTURE, BLOOD (ROUTINE X 2)
CULTURE: NO GROWTH
CULTURE: NO GROWTH

## 2015-06-20 LAB — GLUCOSE, CAPILLARY: GLUCOSE-CAPILLARY: 358 mg/dL — AB (ref 65–99)

## 2015-06-20 MED ORDER — LISINOPRIL 2.5 MG PO TABS: 2.5000 mg | ORAL_TABLET | Freq: Every day | ORAL | Status: DC

## 2015-06-20 NOTE — Progress Notes (Signed)
   Subjective:    Patient ID: Mitchell [name], male    DOB: 01/10/1976, 40 y.o.   MRN: [id number]  CC: follow up on his foot  HPI Right foot abscess: the pain is still there. Says "I can deal with the pain". He went to his orthopedics yesterday. They removed the callus and it started hurting again. Reports some bleeding of the callus removal. Denies discharge or further bleeding. He doesn't want amputation which was recommended by ortho. He says he would like to work on his diet to have a good control of his diabetes.  Denies fever or chills.  Diabetes: FBG for the last 4 days 164, 173, 201 & 177. RBG 358 here. He had some soup and chicken about an hour ago. Reports compliance with his medications. Denies polydipsia, polyuria and polyphagia.   Review of Systems Objective:   Physical Exam Filed Vitals:   06/20/15 1515  BP: 114/67  Pulse: 80  Temp: 98.2 F (36.8 C)  TempSrc: Oral  Weight: 177 lb 3.2 oz (80.377 kg)    GEN: appears well, NAD Oropharynx: clear, moist Neck: supple, no LAD CVS: RRR, normal s1 and s2, no murmurs, no edema RESP: no increased work of breathing, good air movement bilaterally, no crackles or wheeze GI: soft, non-tender,non-distended, +BS MSK:RLE with contracture scars below his mid shin. Missing first toe. Open wound about 1 cm where the callus was removed.  No apparent drainage. No surrounding skin erythema or fluid loculation.     Assessment & Plan:  Foot abscess, right Improved. No sign of ongoing infection or inflammation. Still taking doxycycline. He has an appointment with ortho in 2 weeks  Diabetes mellitus type 2 with complications Poorly controlled. Last A1c 8.9, from 8.8 3 months priorRandom blood sugar 358 here. FBG for the last 4 days 164, 173, 201 & 177.  -Will continue Lantus, metformin, glipizide -added lisinopril at 2.5 mg daily for renal protection. Willl increase this as tolerated -Will check lipid panel and evaluate for ASCVD  risk -Gave him handouts about diet and  Exercise in diabetes. He looks motivated to after the amputation was brought up by orthopedics.  -Will assess again in 3 months

## 2015-06-20 NOTE — Assessment & Plan Note (Addendum)
Poorly controlled. Last A1c 8.9, from 8.8 3 months priorRandom blood sugar 358 here. FBG for the last 4 days 164, 173, 201 & 177.  -Will continue Lantus, metformin, glipizide -added lisinopril at 2.5 mg daily for renal protection. Willl increase this as tolerated -Will check lipid panel and evaluate for ASCVD risk -Gave him handouts about diet and  Exercise in diabetes. He looks motivated to after the amputation was brought up by orthopedics.  -Will assess again in 3 months

## 2015-06-20 NOTE — Patient Instructions (Signed)
La diabetes mellitus y los alimentos (Diabetes Mellitus and Food) Es importante que controle su nivel de azcar en la sangre (glucosa). El nivel de glucosa en sangre depende en gran medida de lo que usted come. Comer alimentos saludables en las cantidades [place] a lo largo del Training and development officer, aproximadamente a la misma hora [employer], lo ayudar a Chief Technology Officer su nivel de Multimedia programmer. Tambin puede ayudarlo a retrasar o Patent attorney de la diabetes mellitus. Comer de [employer] saludable incluso puede ayudarlo a Chartered loss adjuster de presin arterial y a Science writer o Theatre manager un peso saludable.  Entre las recomendaciones generales para alimentarse y Audiological scientist los alimentos de forma saludable, se incluyen las siguientes:  Respetar las comidas principales y comer colaciones con regularidad. Evitar pasar largos perodos sin comer con el fin de perder peso.  Seguir una dieta que consista principalmente en alimentos de origen vegetal, como frutas, vegetales, frutos secos, legumbres y cereales integrales.  Utilizar mtodos de coccin a baja temperatura, como hornear, en lugar de mtodos de coccin a alta temperatura, como frer en abundante aceite. Trabaje con el nutricionista para aprender a Financial planner nutricional de las etiquetas de los alimentos. CMO PUEDEN AFECTARME LOS ALIMENTOS? Carbohidratos Los carbohidratos afectan el nivel de glucosa en sangre ms que cualquier otro tipo de alimento. El nutricionista lo ayudar a Teacher, adult education cuntos carbohidratos puede consumir en cada comida y ensearle a contarlos. El recuento de carbohidratos es importante para mantener la glucosa en sangre en un nivel saludable, en especial si utiliza insulina o toma determinados medicamentos para la diabetes mellitus. Alcohol El alcohol puede provocar disminuciones sbitas de la glucosa en sangre (hipoglucemia), en especial si utiliza insulina o toma determinados medicamentos para la diabetes mellitus. La  hipoglucemia es una afeccin que puede poner en peligro la vida. Los sntomas de la hipoglucemia (somnolencia, mareos y Data processing manager) son similares a los sntomas de haber consumido mucho alcohol.  Si el mdico lo autoriza a beber alcohol, hgalo con moderacin y siga estas pautas:  Las mujeres no deben beber ms de un trago por da, y los hombres no deben beber ms de dos tragos por Training and development officer. Un trago es igual a:  12 onzas (355 ml) de cerveza  5 onzas de vino (150 ml) de vino  1,5onzas (23m) de bebidas espirituosas  No beba con el estmago vaco.  Mantngase hidratado. Beba agua, gaseosas dietticas o t helado sin azcar.  Las gaseosas comunes, los jugos y otros refrescos podran contener muchos carbohidratos y se dCivil Service fast streamer QU ALIMENTOS NO SE RECOMIENDAN? Cuando haga las elecciones de alimentos, es importante que recuerde que todos los alimentos son distintos. Algunos tienen menos nutrientes que otros por porcin, aunque podran tener la misma cantidad de caloras o carbohidratos. Es difcil darle al cuerpo lo que necesita cuando consume alimentos con menos nutrientes. Estos son algunos ejemplos de alimentos que debera evitar ya que contienen muchas caloras y carbohidratos, pero pocos nutrientes:  GPhysicist, medicaltrans (la mayora de los alimentos procesados incluyen grasas trans en la etiqueta de Informacin nutricional).  Gaseosas comunes.  Jugos.  Caramelos.  Dulces, como tortas, pasteles, rosquillas y g[place] Valleys  Comidas fritas. QU ALIMENTOS PUEDO COMER? Consuma alimentos ricos en nutrientes, que nutrirn el cuerpo y lo mantendrn saludable. Los alimentos que debe comer tambin dependern de varios factores, como:  Las caloras que necesita.  Los medicamentos que toma.  Su peso.  El nivel de glucosa en sMarist College  El nArrow Rockde presin arterial.  El nivel de colesterol.  Debe consumir una amplia variedad de alimentos, por ejemplo:  Protenas.  Cortes de PPL Corporationcarne  magros.  Protenas con bajo contenido de grasas saturadas, como pescado, clara de huevo y frijoles. Evite las carnes procesadas.  Frutas y vegetales.  Frutas y Sports administratorvegetales que pueden ayudar a Chief Operating Officercontrolar los niveles sanguneos de [place] Marketglucosa, como Nevillemanzanas, mangos y batatas.  Productos lcteos.  Elija productos lcteos sin grasa o con bajo contenido de Beattiegrasa, como [place], yogur y [place] Vistaqueso.  Cereales, panes, pastas y arroz.  Elija cereales integrales, como panes multicereales, avena en grano y arroz integral. Estos alimentos pueden ayudar a controlar la presin arterial.  Rosalin HawkingGrasas.  Alimentos que contengan grasas saludables, como frutos secos, Chartered certified accountantaguacate, aceite de [place] V[place]rieoliva, aceite de canola y pescado. TODOS LOS QUE PADECEN DIABETES MELLITUS TIENEN EL MISMO PLAN DE COMIDAS? Dado que todas las personas que padecen diabetes mellitus son distintas, no hay un solo plan de comidas que funcione para todos. Es muy importante que se rena con un nutricionista que lo ayudar a crear un plan de comidas adecuado para usted.   Esta informacin no tiene Theme park managercomo fin reemplazar el consejo del mdico. Asegrese de hacerle al mdico cualquier pregunta que tenga.   Document Released: 04/20/2007 Document Revised: 02/01/2014 Elsevier Interactive Patient Education [employer] Inc2016 Elsevier Inc.  Diabetes y actividad fsica (Diabetes and Exercise) Hacer actividad fsica con regularidad es muy importante. No se trata solo de Johnson Controlsperder peso. Tiene muchos otros beneficios, como por ejemplo:  Mejorar el estado fsico, la flexibilidad y la resistencia.  Aumenta la densidad sea.  Ayuda a Art gallery managercontrolar el peso.  Disminuye la Art gallery managergrasa corporal.  Aumenta la fuerza muscular.  Reduce el estrs y las tensiones.  Mejora el estado de salud general. Las personas diabticas que realizan actividad fsica tienen beneficios adicionales debido al ejercicio:  Reduce el apetito.  El organismo mejora el uso del azcar (glucosa) de la [place].  Ayuda a  disminuir o Engineer, maintenance (IT)controlar la glucosa en la sangre.  Disminuye la presin arterial.  Ayuda a disminuir los lpidos en la sangre (colesterol y triglicridos).  El organismo mejora el uso de la insulina porque:  Aumenta la sensibilidad del organismo a la insulina.  Reduce las necesidades de insulina del organismo.  Disminuye el riesgo de enfermedad cardaca por la actividad fsica ya que  disminuye el colesterol y TEPPCO Partnerstambin los triglicridos.  Aumenta los niveles de colesterol bueno (como las lipoprotenas de alta densidad [HDL]) en el organismo.  Disminuye los niveles de glucosa en la [place]angre. SU PLAN DE ACTIVIDAD  Elija una actividad que disfrute y establezca objetivos realistas. Para ejercitarse sin riesgos, debe comenzar a Education administratorpracticar cualquier actividad fsica nueva lentamente y aumentar la intensidad del ejercicio de forma gradual con el tiempo. Su mdico o educador en diabetes podrn ayudarlo a crear un plan de actividades que lo beneficie. Las recomendaciones generales incluyen lo siguiente:  Air cabin crewAlentar a los nios para que realicen al menos 60 minutos de actividad fsica Management consultantcada da.  Estirarse y Education officer, environmentalrealizar ejercicios de entrenamiento de la fuerza, como yoga o levantamiento de pesas, por lo menos 2 veces por semana.  Realizar en total por lo menos 150 minutos de ejercicios de intensidad moderada cada semana, como caminar a paso ligero o hacer gimnasia acutica.  Hacer ejercicio fsico por lo menos 3 das por semana y no dejar pasar ms de 2 das seguidos sin ejercitarse.  Evitar los perodos largos de inactividad (90 minutos o ms tiempo). Cuando deba pasar mucho tiempo sentado, haga pausas frecuentes para caminar o estirarse.  RECOMENDACIONES PARA REALIZAR EJERCICIOS CUANDO SE TIENE DIABETES TIPO 1 O TIPO 2   Controle la glucosa en la sangre antes de comenzar. Si el nivel de glucosa en la sangre es de ms de 240 mg/dl, controle las cetonas en la [place]. No haga actividad fsica si hay  cetonas.  Evite inyectarse insulina en las zonas del cuerpo que ejercitar. Por ejemplo, evite inyectarse insulina en:  Los brazos, si juega al tenis.  Las piernas, si corre.  Lleve un registro de:  Los alimentos que consume antes y despus de Tour manager.  Los momentos esperables de picos de accin de la insulina.  Los niveles de glucosa en la sangre antes y despus de hacer ejercicios.  El tipo y cantidad de [place] fsica que Biomedical engineer.  Revise los registros con su mdico. El mdico lo ayudar a Environmental education officer pautas para ajustar la cantidad de alimento y las cantidades de insulina antes y despus de Radio producer ejercicios.  Si toma insulina o agentes hipoglucemiantes por va oral, observe si hay signos y sntomas de hipoglucemia. Entre los que se incluyen:  Mareos.  Temblores.  Sudoracin.  Escalofros.  Confusin.  Beba gran cantidad de agua mientras hace ejercicios para evitar la deshidratacin o los golpes de Airline pilot. Durante la actividad fsica se pierde agua corporal que se debe reponer.  Comente con su mdico antes de comenzar un programa de actividad fsica para verificar que sea seguro para usted. Recuerde, cualquier actividad es mejor que ninguna.   Esta informacin no tiene Theme park manager el consejo del mdico. Asegrese de hacerle al mdico cualquier pregunta que tenga.   Document Released: 01/31/2007 Document Revised: 05/28/2014 Elsevier Interactive Patient Education Yahoo! Inc.

## 2015-06-20 NOTE — Assessment & Plan Note (Signed)
Improved. No sign of ongoing infection or inflammation. Still taking doxycycline. He has an appointment with ortho in 2 weeks

## 2015-08-12 ENCOUNTER — Ambulatory Visit: Payer: Self-pay

## 2015-08-21 ENCOUNTER — Ambulatory Visit: Payer: No Typology Code available for payment source

## 2015-09-12 ENCOUNTER — Encounter: Payer: Self-pay | Admitting: Student

## 2015-09-12 ENCOUNTER — Ambulatory Visit (INDEPENDENT_AMBULATORY_CARE_PROVIDER_SITE_OTHER): Payer: No Typology Code available for payment source | Admitting: Student

## 2015-09-12 VITALS — BP 108/69 | HR 74 | Temp 98.2°F | Ht 64.0 in | Wt 178.8 lb

## 2015-09-12 DIAGNOSIS — R1011 Right upper quadrant pain: Secondary | ICD-10-CM

## 2015-09-12 DIAGNOSIS — R10811 Right upper quadrant abdominal tenderness: Secondary | ICD-10-CM | POA: Insufficient documentation

## 2015-09-12 DIAGNOSIS — E1149 Type 2 diabetes mellitus with other diabetic neurological complication: Secondary | ICD-10-CM

## 2015-09-12 DIAGNOSIS — M79671 Pain in right foot: Secondary | ICD-10-CM

## 2015-09-12 DIAGNOSIS — E118 Type 2 diabetes mellitus with unspecified complications: Secondary | ICD-10-CM

## 2015-09-12 LAB — COMPLETE METABOLIC PANEL WITH GFR
ALBUMIN: 4.2 g/dL (ref 3.6–5.1)
ALK PHOS: 76 U/L (ref 40–115)
ALT: 48 U/L — AB (ref 9–46)
AST: 46 U/L — ABNORMAL HIGH (ref 10–40)
BILIRUBIN TOTAL: 0.8 mg/dL (ref 0.2–1.2)
BUN: 10 mg/dL (ref 7–25)
CALCIUM: 9.1 mg/dL (ref 8.6–10.3)
CO2: 23 mmol/L (ref 20–31)
CREATININE: 0.75 mg/dL (ref 0.60–1.35)
Chloride: 103 mmol/L (ref 98–110)
GFR, Est African American: >89 mL/min (ref >=60)
GFR, Est Non African American: >89 mL/min (ref >=60)
GLUCOSE: 182 mg/dL — AB (ref 65–99)
Potassium: 3.7 mmol/L (ref 3.5–5.3)
SODIUM: 136 mmol/L (ref 135–146)
TOTAL PROTEIN: 7.3 g/dL (ref 6.1–8.1)

## 2015-09-12 LAB — POCT GLYCOSYLATED HEMOGLOBIN (HGB A1C): HEMOGLOBIN A1C: 7.4

## 2015-09-12 MED ORDER — LISINOPRIL 5 MG PO TABS: 2.5000 mg | ORAL_TABLET | Freq: Every day | ORAL | 3 refills | Status: DC

## 2015-09-12 MED ORDER — DULOXETINE HCL 30 MG PO CPEP: 30.0000 mg | ORAL_CAPSULE | Freq: Every day | ORAL | 0 refills | Status: DC

## 2015-09-12 NOTE — Progress Notes (Signed)
   Subjective:    Patient ID: Mitchell [name] is a 40 y.o. old male. Pacific video interpreter used for this encounter  CC: Diabetes  HPI #Diabetes: didn't bring his blood sugar log today. He says his highest FBG was 123 this week. His lowest was 80 two months ago.  Reports drinking about 10-11 of half litter bottles of water everyday. Reports waking up 2-3 times a night for urination. Denies any vision change. He states his right leg doesn't hurt as it used to. However, he requests a refill on his pain medication.   RUQ pain: this has been going on for two months. Pain is colicky. Pain progression is about the same. Pain is intermittent. About 5-6 times a week. Pain lasts about 3 to 4 minutes. No association with food. No radiation. Denies diarrhea or vomiting. Denies EtOH or smoking.   PMH: reviewed SH: Denies alcohol use or smoking  Review of Systems Per HPI Objective:   Vitals:   09/12/15 1548  BP: 108/69  Pulse: 74  Temp: 98.2 F (36.8 C)  TempSrc: Oral  Weight: 178 lb 12.8 oz (81.1 kg)  Height: 5\' 4"  (1.626 m)    GEN: appears well, no apparent distress. CVS: RRR, normal s1 and s2, no murmurs, no edema RESP: no increased work of breathing, good air movement bilaterally GI: +BS, soft, mildly tender to palpation over epigastric and right upper quadrant,non-distended, negative for hepatomegaly, Carnett Murphy sign negative.  MSK:RLE with contracture scars below his mid shin. Missing first toe. NEURO: alert and oriented appropriately, no gross deficits. Seven point monofilament exam within normal limits bilaterally PSYCH: appropriate mood and affect     Assessment & Plan:  Diabetes mellitus type 2 with complications (HCC) Improved. A1c 7.4 (8.9 about 3 months ago). Seven point monofilament exam within normal limit. Reports improvement in his right leg pain.  -No changes to medication today except increasing his lisinopril to 5 mg for renal protection.  -Gave diabetic  logbook today -Urine microalbumin to creatinine ratio today -Follow up in 3 months  Abdominal pain, right upper quadrant Unclear etiology. Colicky nature of the pain suggest gallbladder etiology. However, negative Carnett sign suggests musculoskeletal etiology. Unlikely for cholecystitis or appendicitis without fever. Murphy sign negative.  -CMP today  Right foot pain We will try Cymbalta. Started at 30 mg daily. If he tolerates this well, I will increase the dose to 60 mg daily in about 2 weeks. Advised to stop tramadol.

## 2015-09-12 NOTE — Assessment & Plan Note (Signed)
We will try Cymbalta. Started at 30 mg daily. If he tolerates this well, I will increase the dose to 60 mg daily in about 2 weeks. Advised to stop tramadol.

## 2015-09-12 NOTE — Assessment & Plan Note (Signed)
Unclear etiology. Colicky nature of the pain suggest gallbladder etiology. However, negative Carnett sign suggests musculoskeletal etiology. Unlikely for cholecystitis or appendicitis without fever. Murphy sign negative.  -CMP today

## 2015-09-12 NOTE — Assessment & Plan Note (Signed)
Improved. A1c 7.4 (8.9 about 3 months ago). Seven point monofilament exam within normal limit. Reports improvement in his right leg pain.  -No changes to medication today except increasing his lisinopril to 5 mg for renal protection.  -Gave diabetic logbook today -Urine microalbumin to creatinine ratio today -Follow up in 3 months

## 2015-09-12 NOTE — Patient Instructions (Addendum)
It was great seeing you today! We have addressed the following issues today  1. Diabetes: A1c 7.4 down from 8.9. Please continue to take the medication as prescribed. Keep up with the good work. I also recommend exercise for about 150 minutes a week and weight loss. Please go to the eye doctor to have eyes checked as a diabetic can affect your eyes as well. Please bring in your blood glucose log book when you come back  in 3 months us for your diabetes.  2. Neuropathic pain: I have sent a new prescription for Cymbalta to your pharmacy. Stop tramadol today. If he tolerates the medication I will increase the dose of Cymbalta in a week or 2.  3. Abdominal pain: This could be just a muscle pain. However, I will check your blood to make sure that this is not related to your liver. Avoid greasy foods.    If we did any lab work today, and the results require attention, either me or my nurse will get in touch with you. If everything is normal, you will get a letter in mail. If you don't hear from us in two weeks, please give us a call. Otherwise, I look forward to talking with you again at our next visit. If you have any questions or concerns before then, please call the clinic at [phone].  Please bring all your medications to every doctors visit   Sign up for My Chart to have easy access to your labs results, and communication with your Primary care physician.    Please check-out at the front desk before leaving the clinic.   Take Care,

## 2015-09-13 LAB — MICROALBUMIN / CREATININE URINE RATIO
Creatinine, Urine: 39 mg/dL (ref 20–370)
MICROALB UR: 0.4 mg/dL
Microalb Creat Ratio: 10 mcg/mg creat (ref <30)

## 2015-09-22 ENCOUNTER — Encounter: Payer: Self-pay | Admitting: Student

## 2015-09-22 NOTE — Progress Notes (Signed)
Attempted to call patient to discuss about his lab results twice using [employer] interpreter. His liver enzymes are slightly elevated. I advise follow-up on this in clinic for further evaluation if patient calls.

## 2015-11-02 ENCOUNTER — Other Ambulatory Visit: Payer: Self-pay | Admitting: Student

## 2015-11-02 DIAGNOSIS — E118 Type 2 diabetes mellitus with unspecified complications: Secondary | ICD-10-CM

## 2015-11-02 DIAGNOSIS — Z794 Long term (current) use of insulin: Principal | ICD-10-CM

## 2015-11-17 ENCOUNTER — Other Ambulatory Visit: Payer: Self-pay | Admitting: *Deleted

## 2015-11-17 DIAGNOSIS — E1149 Type 2 diabetes mellitus with other diabetic neurological complication: Secondary | ICD-10-CM

## 2015-11-17 DIAGNOSIS — E118 Type 2 diabetes mellitus with unspecified complications: Secondary | ICD-10-CM

## 2015-11-18 MED ORDER — DULOXETINE HCL 60 MG PO CPEP: 60.0000 mg | ORAL_CAPSULE | Freq: Every day | ORAL | 0 refills | Status: DC

## 2015-11-18 MED ORDER — LISINOPRIL 5 MG PO TABS: 2.5000 mg | ORAL_TABLET | Freq: Every day | ORAL | 3 refills | Status: DC

## 2015-12-08 ENCOUNTER — Ambulatory Visit (INDEPENDENT_AMBULATORY_CARE_PROVIDER_SITE_OTHER): Payer: No Typology Code available for payment source | Admitting: Family Medicine

## 2015-12-08 ENCOUNTER — Encounter: Payer: Self-pay | Admitting: Family Medicine

## 2015-12-08 VITALS — BP 133/83 | HR 72 | Temp 97.8°F | Wt 179.0 lb

## 2015-12-08 DIAGNOSIS — R079 Chest pain, unspecified: Secondary | ICD-10-CM | POA: Insufficient documentation

## 2015-12-08 DIAGNOSIS — M79671 Pain in right foot: Secondary | ICD-10-CM

## 2015-12-08 DIAGNOSIS — R222 Localized swelling, mass and lump, trunk: Secondary | ICD-10-CM | POA: Insufficient documentation

## 2015-12-08 DIAGNOSIS — E118 Type 2 diabetes mellitus with unspecified complications: Secondary | ICD-10-CM

## 2015-12-08 MED ORDER — GABAPENTIN 100 MG PO CAPS: 100.0000 mg | ORAL_CAPSULE | Freq: Three times a day (TID) | ORAL | 3 refills | Status: DC

## 2015-12-08 MED ORDER — ASPIRIN EC 81 MG PO TBEC: 81.0000 mg | DELAYED_RELEASE_TABLET | Freq: Every day | ORAL | 11 refills | Status: DC

## 2015-12-08 MED ORDER — NITROGLYCERIN 0.4 MG SL SUBL: 0.4000 mg | SUBLINGUAL_TABLET | SUBLINGUAL | 3 refills | Status: DC | PRN

## 2015-12-08 NOTE — Assessment & Plan Note (Signed)
Concern for angina given exertional symptoms. Will send for stress testing tomorrow. Will start ASA 81 mg daily today. Will also give Rx for nitroglycerin tablets. Strict return precautions reviewed. Follow up in 1 week after stress test.
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
## 2015-12-08 NOTE — Assessment & Plan Note (Signed)
Unclear etiology. Does not seem to be related to his chest pain. Very subtle finding on exam. Seems to be most likely soft tissue source. Will proceed with watchful waiting at this point. If not improving at follow up in 1-2 weeks, would consider imaging with CXR vs directed ultrasound. May consider CT if both of above unremarkable and mass persists.   Precepted with Dr McDiarmid.

## 2015-12-08 NOTE — Assessment & Plan Note (Signed)
Seems to be more neuropathic in nature. No signs of infection today. Patient already on cymbalta. Will start low dose of gabapentin today - 100mg  tid. Follow up in 1-2 weeks. Would consider escalating dose at that time if tolerating without side effects.

## 2015-12-08 NOTE — Patient Instructions (Signed)
Your foot does not look infected. Please start gabapentin 100mg  at night. If you tolerate this without side effects, please increase to 100mg  three times a day.  I am concerned your chest pain may be coming from your heart. We will get a stress test to check your heart. Please take an aspirin daily. Please take the nitroglycerin if you start having chest pain. Seek medical emergently if the chest pain does not go away or is severe.  The lump on your chest is probably benign. It should go away within the next few weeks. We can recheck it next time you come in.  Please come back in 1-2 weeks to follow up on your chest pain and foot pain, or sooner as needed.  Take care,  Dr Jimmey RalphParker

## 2015-12-09 ENCOUNTER — Encounter: Payer: Self-pay | Admitting: Student

## 2015-12-09 ENCOUNTER — Ambulatory Visit (INDEPENDENT_AMBULATORY_CARE_PROVIDER_SITE_OTHER): Payer: Self-pay | Admitting: Student

## 2015-12-09 ENCOUNTER — Inpatient Hospital Stay (HOSPITAL_COMMUNITY): Admission: RE | Admit: 2015-12-09 | Payer: Self-pay | Source: Ambulatory Visit

## 2015-12-09 VITALS — BP 135/81 | HR 74 | Temp 98.0°F | Ht 64.0 in | Wt 181.0 lb

## 2015-12-09 DIAGNOSIS — R079 Chest pain, unspecified: Secondary | ICD-10-CM

## 2015-12-09 DIAGNOSIS — E118 Type 2 diabetes mellitus with unspecified complications: Secondary | ICD-10-CM

## 2015-12-09 DIAGNOSIS — R10811 Right upper quadrant abdominal tenderness: Secondary | ICD-10-CM

## 2015-12-09 LAB — POCT GLYCOSYLATED HEMOGLOBIN (HGB A1C): HEMOGLOBIN A1C: 7.8

## 2015-12-09 NOTE — Progress Notes (Signed)
   Subjective:    Patient ID: Mitchell [name] is a 40 y.o. old male.  [id number] HPI #Chest pain: this has been going on for 15 days. He also has swelling that he describes as a ball over his epigastric area. He was lying down when he felt soficated and felt a chest pain. Then he felt the swelling. Pain is sharp. No radiation to arm or jaw. It on and off. Feels the pain once or twice a day. No specific trigger. Pain can happen when he sleeps, sits or walks. Painful to touch as well. He has an appointment with cardiologyist today and had difficulty with the direction and missed it. He has another apt next week. Reports shortness of breathing with ambulattion. Denies orthopnea. He never had similar pain in the past.  Denies nausea, diaphoresis, palpitation or dizziness.  Had tactile fever two days ago. No cold like symptoms.   Patient was here yesterday and was given NTG. He says that helped a little bit.   PMH: history of GERD. Heartburn resolved with med. He is not on medicine for this now. History of diabetes.   SH: denies smoking, drinking or recreational drug use.   Review of Systems Per HPI Objective:   Vitals:   12/09/15 1608  BP: 135/81  Pulse: 74  Temp: 98 F (36.7 C)  TempSrc: Oral  Weight: 181 lb (82.1 kg)  Height: 5\' 4"  (1.626 m)    GEN: appears well, no apparent distress. Eye: sclera anicteric Oropharynx: mmm without erythema or exudation HEM: no cervical LAD CVS: RRR, normal s1 and s2, no murmurs, no edema RESP: no increased work of breathing, good air movement bilaterally, no crackles or wheeze GI: Bowel sounds present and normal, guarding, liver size about 2-3 below costal margin, tender to palpation over right upper quadrant, negative for Murphy sign, negative Carnett sign MSK: Tenderness to palpation over most medial end of right subcostal margin. RLE with contracture scars below his mid shin. Missing first toe SKIN: no jaundice NEURO: alert and oriented  appropriately, no gross defecits  PSYCH: appropriate mood and affect     Assessment & Plan:  Chest pain Likely musculoskeletal based on his description. Pain is also reproducible. I have no good explanation for his shortness of breath. He has already been referred to cardiologist for stress test. Unfortunately, he missed his appointment today. He has another appointment for next week.  I also recommended using heating pad  Abdominal right upper quadrant tenderness He has a concerning exam finding with guarding, ?hepatomegally, tenderness to palpation over RUQ and negative Carnett's sign. Murphy sign is negative. He has no jaundice. He had fever 2 days ago but no nausea, emesis or diarrhea.  I will get CMP. This was placed as future order as lab is closed now. Patient will return tomorrow for lab draw. Advised him to call before coming in. Will consider imaging based on CMP result.   Diabetes mellitus type 2 with complications (HCC) A1c 7.8. Previously 7.4. Advised patient to schedule an appointment in 2 weeks for follow-up on his diabetes  I discussed patient with Dr. Leveda AnnaHensel.

## 2015-12-09 NOTE — Patient Instructions (Signed)
It was great seeing you today! We have addressed the following issues today  1. Chest/abdominal pain: I have ordered a blood test for tomorrow. Please call the front desk office before you come in for the blood draw.  2.   Diabetes: Please return in the next 1-2 weeks to discuss about the diabetes. Please bring her blood glucose numbers to the visit.    If we did any lab work today, and the results require attention, either me or my nurse will get in touch with you. If everything is normal, you will get a letter in mail. If you don't hear from us in two weeks, please give us a call. Otherwise, we look forward to seeing you again at your next visit. If you have any questions or concerns before then, please call the clinic at [phone].   Please bring all your medications to every doctors visit   Sign up for My Chart to have easy access to your labs results, and communication with your Primary care physician.     Please check-out at the front desk before leaving the clinic.    Take Care,

## 2015-12-10 ENCOUNTER — Other Ambulatory Visit: Payer: Self-pay

## 2015-12-10 DIAGNOSIS — R10811 Right upper quadrant abdominal tenderness: Secondary | ICD-10-CM

## 2015-12-10 LAB — COMPLETE METABOLIC PANEL WITH GFR
ALBUMIN: 4.1 g/dL (ref 3.6–5.1)
ALT: 47 U/L — ABNORMAL HIGH (ref 9–46)
AST: 40 U/L (ref 10–40)
Alkaline Phosphatase: 82 U/L (ref 40–115)
BUN: 10 mg/dL (ref 7–25)
CHLORIDE: 102 mmol/L (ref 98–110)
CO2: 25 mmol/L (ref 20–31)
Calcium: 8.9 mg/dL (ref 8.6–10.3)
Creat: 0.65 mg/dL (ref 0.60–1.35)
GFR, Est African American: >89 mL/min (ref >=60)
GFR, Est Non African American: >89 mL/min (ref >=60)
GLUCOSE: 194 mg/dL — AB (ref 65–99)
POTASSIUM: 3.7 mmol/L (ref 3.5–5.3)
Sodium: 136 mmol/L (ref 135–146)
Total Bilirubin: 0.9 mg/dL (ref 0.2–1.2)
Total Protein: 7.5 g/dL (ref 6.1–8.1)

## 2015-12-10 NOTE — Assessment & Plan Note (Signed)
Likely musculoskeletal based on his description. Pain is also reproducible. I have no good explanation for his shortness of breath. He has already been referred to cardiologist for stress test. Unfortunately, he missed his appointment today. He has another appointment for next week.  I also recommended using heating pad

## 2015-12-10 NOTE — Assessment & Plan Note (Signed)
A1c 7.8. Previously 7.4. Advised patient to schedule an appointment in 2 weeks for follow-up on his diabetes

## 2015-12-10 NOTE — Assessment & Plan Note (Signed)
He has a concerning exam finding with guarding, ?hepatomegally, tenderness to palpation over RUQ and negative Carnett's sign. Murphy sign is negative. He has no jaundice. He had fever 2 days ago but no nausea, emesis or diarrhea.  I will get CMP. This was placed as future order as lab is closed now. Patient will return tomorrow for lab draw. Advised him to call before coming in. Will consider imaging based on CMP result.

## 2015-12-12 ENCOUNTER — Telehealth (HOSPITAL_COMMUNITY): Payer: Self-pay

## 2015-12-12 NOTE — Telephone Encounter (Signed)
I did notify Rene KocherRegina and she has canceled this appointment.

## 2015-12-16 ENCOUNTER — Telehealth: Payer: Self-pay | Admitting: Student

## 2015-12-16 ENCOUNTER — Encounter: Payer: Self-pay | Admitting: Student

## 2015-12-16 NOTE — Telephone Encounter (Signed)
Called patient using [employer] interpreter with ID [phone] to discuss about his CMP which is basically normal except for hyperglycemia and higher side of normal ALT. Patient states that his right sided chest pain has improved. He denies further abdominal pain. Patient has a follow-up with me in a week. We will discuss about further plan at that time. We can hold off further study or imaging for his ?hepatomegally.

## 2015-12-17 ENCOUNTER — Encounter (HOSPITAL_COMMUNITY): Payer: Self-pay

## 2015-12-22 ENCOUNTER — Encounter: Payer: Self-pay | Admitting: Student

## 2015-12-22 ENCOUNTER — Ambulatory Visit (INDEPENDENT_AMBULATORY_CARE_PROVIDER_SITE_OTHER): Payer: Self-pay | Admitting: Student

## 2015-12-22 VITALS — BP 123/78 | HR 98 | Temp 99.8°F | Resp 16 | Wt 179.8 lb

## 2015-12-22 DIAGNOSIS — Z9189 Other specified personal risk factors, not elsewhere classified: Secondary | ICD-10-CM

## 2015-12-22 DIAGNOSIS — E118 Type 2 diabetes mellitus with unspecified complications: Secondary | ICD-10-CM

## 2015-12-22 DIAGNOSIS — E1149 Type 2 diabetes mellitus with other diabetic neurological complication: Secondary | ICD-10-CM

## 2015-12-22 DIAGNOSIS — Z794 Long term (current) use of insulin: Secondary | ICD-10-CM

## 2015-12-22 DIAGNOSIS — R1011 Right upper quadrant pain: Secondary | ICD-10-CM

## 2015-12-22 DIAGNOSIS — R10811 Right upper quadrant abdominal tenderness: Secondary | ICD-10-CM

## 2015-12-22 MED ORDER — INSULIN GLARGINE 100 UNIT/ML ~~LOC~~ SOLN: 50.0000 [IU] | Freq: Every day | SUBCUTANEOUS | 0 refills | Status: DC

## 2015-12-22 MED ORDER — DULOXETINE HCL 60 MG PO CPEP: 60.0000 mg | ORAL_CAPSULE | Freq: Every day | ORAL | 0 refills | Status: DC

## 2015-12-22 NOTE — Patient Instructions (Addendum)
It was great seeing you today! We have addressed the following issues today  1. Diabetes: I have increased the Lantus to 50 units at bedtime. Stop taking glipizide. Continue checking your blood glucose in the morning before breakfast and write them down and bring the numbers to your next visit. Please come back and see us in 2 weeks 2.  Abdominal pain: We have ordered an ultrasound of the abdomen/liver. This is a scheduled for December 6 at 7:30 AM. Please arrive 15 minutes early for this appointment.   3.  Please call the cardiologist office to reschedule your stress test. They should be able to do a chemical stress test instead of treadmill.    If we did any lab work today, and the results require attention, either me or my nurse will get in touch with you. If everything is normal, you will get a letter in mail. If you don't hear from us in two weeks, please give us a call. Otherwise, we look forward to seeing you again at your next visit. If you have any questions or concerns before then, please call the clinic at [phone].   Please bring all your medications to every doctors visit   Sign up for My Chart to have easy access to your labs results, and communication with your Primary care physician.     Please check-out at the front desk before leaving the clinic.    Take Care,

## 2015-12-22 NOTE — Progress Notes (Signed)
   Subjective:    Patient ID: Mitchell [name] is a 40 y.o. old male.  [employer] interpreter was ID [phone] was used for this encounter  HPI #Diabetes: takes Lantus 44 units, metformin 1 gm twice a day and glipizide 5 mg twice a day. Reports good compliance with medication. FBG 192 this morning. The lowest FBG 155. The highest FBG was 290. Reports dry mouth. Drinks 8-9 bottles of water. Urinates 4 times a day. Denies vision changes. Denies fever or chill. Denies symptoms of hypoglycemia.   #RUQ/Chest pain: this has been going on for about a month now. He was lying down when he felt soficated and felt a "chest pain". Then he felt the swelling. Pain is sharp. It on and off. Feels the pain once or twice a day. No specific trigger. Pain can happen when he sleeps, sits or walks. Painful to touch as well. He was seen in our clinic 2 weeks ago and was given nitroglycerin and referred to cardiologist. Unfortunately, the cardiologist office cancelled his appointment as he won't be able to walk on the treadmill due to his chronic leg pain. Nitroglycerin helped his chronic leg pain. We also obtained a CMP which was basically negative. He denies alcohol use although he had history of alcohol abuse in the past.  Denies nausea, vomiting, diaphoresis, palpitation, dizziness, fever, cold like symptoms Review of Systems Per HPI Objective:   Vitals:   12/22/15 1613  BP: 123/78  Pulse: 98  Resp: 16  Temp: 99.8 F (37.7 C)  TempSrc: Oral  SpO2: 97%  Weight: 179 lb 12.8 oz (81.6 kg)   GEN: appears well, no apparent distress. Eye: sclera anicteric Oropharynx: mmm without erythema or exudation HEM: no cervical LAD CVS: RRR, normal s1 and s2, no murmurs, no edema RESP: no increased work of breathing, good air movement bilaterally, no crackles or wheeze GI: Bowel sounds present and normal, guarding, liver size about 2-3 below costal margin, tender to palpation over right upper quadrant, negative for Murphy  sign, negative Carnett sign MSK: Tenderness to palpation over most medial end of right subcostal margin. RLE with contracture scars below his mid shin. Missing first toe SKIN: no jaundice, clean-looking scar in his right leg, callus in his right foot. See picture for more.      NEURO: alert and oriented appropriately, no gross defecits  PSYCH: appropriate mood and affect       Assessment & Plan:  Diabetes mellitus type 2 with complications (HCC) Increased the Lantus to 50 units at bedtime. Stopped glipizide. Continue checking your blood glucose in the morning before breakfast and write them down and bring the numbers to your next visit. Please come back and see us in 2 weeks. Will order a referral to podiatrist for his diabetic foot.  Abdominal right upper quadrant tenderness CMP within normal limits weeks ago. Exam suspicious for hepatomegaly. Ordered and scheduled right upper quadrant ultrasound. He has history of alcohol abuse and drug use in the past

## 2015-12-23 NOTE — Assessment & Plan Note (Addendum)
Increased the Lantus to 50 units at bedtime. Stopped glipizide. Continue checking your blood glucose in the morning before breakfast and write them down and bring the numbers to your next visit. Please come back and see us in 2 weeks. Will order a referral to podiatrist for his diabetic foot.

## 2015-12-23 NOTE — Assessment & Plan Note (Signed)
CMP within normal limits weeks ago. Exam suspicious for hepatomegaly. Ordered and scheduled right upper quadrant ultrasound. He has history of alcohol abuse and drug use in the past

## 2015-12-24 ENCOUNTER — Telehealth: Payer: Self-pay | Admitting: Student

## 2015-12-24 ENCOUNTER — Encounter: Payer: Self-pay | Admitting: *Deleted

## 2015-12-24 NOTE — Telephone Encounter (Signed)
A lady from [facility] called. Pt had called to reschedule his stress test.  He isnt a pt there.  A referral is needed.

## 2015-12-24 NOTE — Patient Instructions (Signed)
Pt and wife are aware that the PCP  Needs  To send  a referral for pt . This office will call pt to scheduled test when we get it .

## 2016-01-01 ENCOUNTER — Ambulatory Visit (HOSPITAL_COMMUNITY)
Admission: RE | Admit: 2016-01-01 | Discharge: 2016-01-01 | Disposition: A | Discharge status: Home / Self Care | Payer: Self-pay | Source: Ambulatory Visit | Attending: Student | Admitting: Student

## 2016-01-01 DIAGNOSIS — R1011 Right upper quadrant pain: Secondary | ICD-10-CM | POA: Insufficient documentation

## 2016-01-04 ENCOUNTER — Other Ambulatory Visit: Payer: Self-pay | Admitting: Student

## 2016-01-04 DIAGNOSIS — E119 Type 2 diabetes mellitus without complications: Secondary | ICD-10-CM

## 2016-01-06 ENCOUNTER — Telehealth: Payer: Self-pay | Admitting: Student

## 2016-01-06 NOTE — Telephone Encounter (Signed)
Attempted to call patient twice using pacific interpretor ID number [phone] to discuss about his RUQ US finding. Unfortunately, patient didn't answer the phone. Will attempt calling again at the later time.

## 2016-01-07 ENCOUNTER — Ambulatory Visit (INDEPENDENT_AMBULATORY_CARE_PROVIDER_SITE_OTHER): Payer: Self-pay | Admitting: Student

## 2016-01-07 ENCOUNTER — Encounter: Payer: Self-pay | Admitting: Student

## 2016-01-07 VITALS — BP 118/70 | HR 74 | Temp 98.1°F | Ht 64.0 in | Wt 179.0 lb

## 2016-01-07 DIAGNOSIS — R10811 Right upper quadrant abdominal tenderness: Secondary | ICD-10-CM

## 2016-01-07 DIAGNOSIS — R748 Abnormal levels of other serum enzymes: Secondary | ICD-10-CM

## 2016-01-07 DIAGNOSIS — R932 Abnormal findings on diagnostic imaging of liver and biliary tract: Secondary | ICD-10-CM

## 2016-01-07 MED ORDER — ASPIRIN 81 MG PO CHEW: 81.0000 mg | CHEWABLE_TABLET | Freq: Every day | ORAL | 11 refills | Status: DC

## 2016-01-07 NOTE — Patient Instructions (Addendum)
It was great seeing you today! We have addressed the following issues today  1. Liver: please call and schedule a lab visit to have your blood test done.    If we did any lab work today, and the results require attention, either me or my nurse will get in touch with you. If everything is normal, you will get a letter in mail. If you don't hear from us in two weeks, please give us a call. Otherwise, we look forward to seeing you again at your next visit. If you have any questions or concerns before then, please call the clinic at [phone].   Please bring all your medications to every doctors visit   Sign up for My Chart to have easy access to your labs results, and communication with your Primary care physician.     Please check-out at the front desk before leaving the clinic.    Portion Size    Choose healthier foods such as 100% whole grains, vegetables, fruits, beans, nut seeds, olive oil, most vegetable oils, fat-free dietary, wild game and fish.   Avoid sweet tea, other sweetened beverages, soda, fruit juice, cold cereal and milk and trans fat.   Eat at least 3 meals and 1-2 snacks per day.  Aim for no more than 5 hours between eating.  Eat breakfast within one hour of getting up.    Exercise at least 150 minutes per week, including weight resistance exercises 3 or 4 times per week.   Try to lose at least 7-10% of your current body weight.

## 2016-01-07 NOTE — Assessment & Plan Note (Signed)
RUQ US that was significant for echogenic liver consistent with fatty infiltration and/or hepatocellular disease. His ALT was mildly elevated. His recent lipid panel from 7 months ago with TG to 449. He was a heavy drinker in the past denies [facility] in the last 3 years. Exam with guarding, liver size about 2-3 below costal margin, tender to palpation over right upper quadrant, negative for Murphy sign. -Will obtain fasting lipid and hep panel. If not revealing, will refer to GI/Hepatologist. Patient to return for labs  -Discussed life style changes including diet and exercise

## 2016-01-07 NOTE — Progress Notes (Signed)
  Subjective:    Mitchell [name] is a 40 y.o. old male here for follow up on his RUQ pain and to discuss RUQ US finding.   [employer] interpreter was ID [phone] was used for this encounter.   HPI RUQ pain: patient continues to endorse RUQ pain, mostly over his medial end of his costal margin. He reports noticing a small lump that is tender. Denies emesis, diarrhea, fever. Denies radiation to back or shoulder. He had RUQ US that was significant for echogenic liver consistent with fatty infiltration and/or hepatocellular disease. His ALT was mildly elevated. His recent lipid panel from 7 months ago with TG to 449. He was a heavy drinker in the past denies [facility] in the last 3 years.   Review of Systems Per HPI  History and Problem List: Mitchell [name] has GERD (gastroesophageal reflux disease); Substance induced mood disorder (HCC); Diabetes mellitus type 2 with complications (HCC); Depression; Drug overdose, intentional (HCC); Alcohol dependence (HCC); Anemia, unspecified; Right foot pain; Rash and nonspecific skin eruption; Right upper quadrant abdominal tenderness; Chest pain; and Lump in chest on his problem list.      Objective:    BP 118/70   Pulse 74   Temp 98.1 F (36.7 C) (Oral)   Ht 5\' 4"  (1.626 m)   Wt 179 lb (81.2 kg)   SpO2 98%   BMI 30.73 kg/m  Physical Exam GEN: appears well, no apparent distress. Eye: sclera anicteric Oropharynx: mmm without erythema or exudation HEM: no cervical LAD CVS: RRR, normal s1 and s2, no murmurs, no edema RESP: no increased work of breathing, good air movement bilaterally, no crackles or wheeze GI: Bowel sounds present and normal, guarding, liver size about 2-3 below costal margin, tender to palpation over right upper quadrant, negative for Murphy sign, negative Carnett sign, noted small circular mobile lump over epigastric area.    Assessment and Plan:   Problem List Items Addressed This Visit      Other   Right upper quadrant abdominal tenderness - Primary     RUQ US that was significant for echogenic liver consistent with fatty infiltration and/or hepatocellular disease. His ALT was mildly elevated. His recent lipid panel from 7 months ago with TG to 449. He was a heavy drinker in the past denies [facility] in the last 3 years. Exam with guarding, liver size about 2-3 below costal margin, tender to palpation over right upper quadrant, negative for Murphy sign. -Will obtain fasting lipid and hep panel. If not revealing, will refer to GI/Hepatologist. Patient to return for labs  -Discussed life style changes including diet and exercise       Relevant Orders   Hepatitis A antibody, IgM   Hepatitis B core antibody, IgM   Hepatitis B surface antibody   Hepatitis B surface antigen   Hepatitis C Ab w/rfl RNA, PCR + Geno   Lipid panel    Other Visit Diagnoses    Echogenic liver on ultrasound       Relevant Orders   Hepatitis A antibody, IgM   Hepatitis B core antibody, IgM   Hepatitis B surface antibody   Hepatitis B surface antigen   Hepatitis C Ab w/rfl RNA, PCR + Geno   Lipid panel      Return in about 1 week (around 01/14/2016) for lab visit.  Almon Herculesaye T Seara Hinesley, MD  Precepted patient with Dr. Leveda AnnaHensel

## 2016-01-20 ENCOUNTER — Inpatient Hospital Stay (HOSPITAL_COMMUNITY)
Admission: EM | Admit: 2016-01-20 | Discharge: 2016-01-22 | DRG: 638 | Disposition: A | Discharge status: Home / Self Care | Payer: Self-pay | Attending: Family Medicine | Admitting: Family Medicine

## 2016-01-20 ENCOUNTER — Encounter (HOSPITAL_COMMUNITY): Payer: Self-pay

## 2016-01-20 ENCOUNTER — Emergency Department (HOSPITAL_COMMUNITY): Payer: Self-pay

## 2016-01-20 DIAGNOSIS — E1169 Type 2 diabetes mellitus with other specified complication: Principal | ICD-10-CM | POA: Diagnosis present

## 2016-01-20 DIAGNOSIS — L089 Local infection of the skin and subcutaneous tissue, unspecified: Secondary | ICD-10-CM

## 2016-01-20 DIAGNOSIS — F329 Major depressive disorder, single episode, unspecified: Secondary | ICD-10-CM | POA: Diagnosis present

## 2016-01-20 DIAGNOSIS — K219 Gastro-esophageal reflux disease without esophagitis: Secondary | ICD-10-CM | POA: Diagnosis present

## 2016-01-20 DIAGNOSIS — D72819 Decreased white blood cell count, unspecified: Secondary | ICD-10-CM | POA: Diagnosis present

## 2016-01-20 DIAGNOSIS — Z7982 Long term (current) use of aspirin: Secondary | ICD-10-CM

## 2016-01-20 DIAGNOSIS — Z833 Family history of diabetes mellitus: Secondary | ICD-10-CM

## 2016-01-20 DIAGNOSIS — E11628 Type 2 diabetes mellitus with other skin complications: Secondary | ICD-10-CM | POA: Diagnosis present

## 2016-01-20 DIAGNOSIS — D696 Thrombocytopenia, unspecified: Secondary | ICD-10-CM | POA: Diagnosis present

## 2016-01-20 DIAGNOSIS — L97509 Non-pressure chronic ulcer of other part of unspecified foot with unspecified severity: Secondary | ICD-10-CM

## 2016-01-20 DIAGNOSIS — L97519 Non-pressure chronic ulcer of other part of right foot with unspecified severity: Secondary | ICD-10-CM | POA: Diagnosis present

## 2016-01-20 DIAGNOSIS — K7460 Unspecified cirrhosis of liver: Secondary | ICD-10-CM | POA: Diagnosis present

## 2016-01-20 DIAGNOSIS — Z89411 Acquired absence of right great toe: Secondary | ICD-10-CM

## 2016-01-20 DIAGNOSIS — Z794 Long term (current) use of insulin: Secondary | ICD-10-CM

## 2016-01-20 DIAGNOSIS — L03115 Cellulitis of right lower limb: Secondary | ICD-10-CM | POA: Diagnosis present

## 2016-01-20 DIAGNOSIS — M86171 Other acute osteomyelitis, right ankle and foot: Secondary | ICD-10-CM | POA: Diagnosis present

## 2016-01-20 DIAGNOSIS — E11621 Type 2 diabetes mellitus with foot ulcer: Secondary | ICD-10-CM

## 2016-01-20 LAB — BASIC METABOLIC PANEL
ANION GAP: 9 (ref 5–15)
BUN: 7 mg/dL (ref 6–20)
CO2: 22 mmol/L (ref 22–32)
Calcium: 8.9 mg/dL (ref 8.9–10.3)
Chloride: 104 mmol/L (ref 101–111)
Creatinine, Ser: 0.69 mg/dL (ref 0.61–1.24)
Glucose, Bld: 261 mg/dL — ABNORMAL HIGH (ref 65–99)
POTASSIUM: 3.9 mmol/L (ref 3.5–5.1)
SODIUM: 135 mmol/L (ref 135–145)

## 2016-01-20 LAB — CBC WITH DIFFERENTIAL/PLATELET
BASOS ABS: 0 10*3/uL (ref 0.0–0.1)
BASOS PCT: 0 %
EOS ABS: 0.1 10*3/uL (ref 0.0–0.7)
EOS PCT: 2 %
HCT: 42.5 % (ref 39.0–52.0)
Hemoglobin: 15.4 g/dL (ref 13.0–17.0)
Lymphocytes Relative: 28 %
Lymphs Abs: 1.1 10*3/uL (ref 0.7–4.0)
MCH: 32.5 pg (ref 26.0–34.0)
MCHC: 36.2 g/dL — ABNORMAL HIGH (ref 30.0–36.0)
MCV: 89.7 fL (ref 78.0–100.0)
Monocytes Absolute: 0.4 10*3/uL (ref 0.1–1.0)
Monocytes Relative: 10 %
Neutro Abs: 2.3 10*3/uL (ref 1.7–7.7)
Neutrophils Relative %: 60 %
PLATELETS: 86 10*3/uL — AB (ref 150–400)
RBC: 4.74 MIL/uL (ref 4.22–5.81)
RDW: 12.8 % (ref 11.5–15.5)
WBC: 3.8 10*3/uL — AB (ref 4.0–10.5)

## 2016-01-20 LAB — GLUCOSE, CAPILLARY
GLUCOSE-CAPILLARY: 282 mg/dL — AB (ref 65–99)
Glucose-Capillary: 178 mg/dL — ABNORMAL HIGH (ref 65–99)
Glucose-Capillary: 337 mg/dL — ABNORMAL HIGH (ref 65–99)

## 2016-01-20 MED ORDER — POLYETHYLENE GLYCOL 3350 17 G PO PACK: 17.0000 g | PACK | Freq: Every day | ORAL | Status: DC | PRN

## 2016-01-20 MED ORDER — VANCOMYCIN HCL IN DEXTROSE 1-5 GM/200ML-% IV SOLN
1000.0000 mg | Freq: Once | INTRAVENOUS | Status: AC
  Administered 2016-01-20: 1000 mg via INTRAVENOUS
  Filled 2016-01-20: qty 200

## 2016-01-20 MED ORDER — ASPIRIN 81 MG PO CHEW
81.0000 mg | CHEWABLE_TABLET | Freq: Every day | ORAL | Status: DC
  Administered 2016-01-20 – 2016-01-22 (×3): 81 mg via ORAL
  Filled 2016-01-20 (×3): qty 1

## 2016-01-20 MED ORDER — VANCOMYCIN HCL IN DEXTROSE 1-5 GM/200ML-% IV SOLN
1000.0000 mg | Freq: Three times a day (TID) | INTRAVENOUS | Status: DC
  Administered 2016-01-20 – 2016-01-22 (×6): 1000 mg via INTRAVENOUS
  Filled 2016-01-20 (×8): qty 200

## 2016-01-20 MED ORDER — GABAPENTIN 100 MG PO CAPS
100.0000 mg | ORAL_CAPSULE | Freq: Three times a day (TID) | ORAL | Status: DC
  Administered 2016-01-20 – 2016-01-22 (×7): 100 mg via ORAL
  Filled 2016-01-20 (×7): qty 1

## 2016-01-20 MED ORDER — INSULIN ASPART 100 UNIT/ML ~~LOC~~ SOLN
0.0000 [IU] | Freq: Every day | SUBCUTANEOUS | Status: DC
  Administered 2016-01-20: 4 [IU] via SUBCUTANEOUS

## 2016-01-20 MED ORDER — DULOXETINE HCL 60 MG PO CPEP
60.0000 mg | ORAL_CAPSULE | Freq: Every day | ORAL | Status: DC
  Administered 2016-01-20 – 2016-01-22 (×3): 60 mg via ORAL
  Filled 2016-01-20 (×3): qty 1

## 2016-01-20 MED ORDER — PIPERACILLIN-TAZOBACTAM 3.375 G IVPB 30 MIN
3.3750 g | Freq: Once | INTRAVENOUS | Status: AC
  Administered 2016-01-20: 3.375 g via INTRAVENOUS
  Filled 2016-01-20: qty 50

## 2016-01-20 MED ORDER — PIPERACILLIN-TAZOBACTAM 3.375 G IVPB
3.3750 g | Freq: Three times a day (TID) | INTRAVENOUS | Status: DC
  Administered 2016-01-20 – 2016-01-22 (×6): 3.375 g via INTRAVENOUS
  Filled 2016-01-20 (×8): qty 50

## 2016-01-20 MED ORDER — INSULIN GLARGINE 100 UNIT/ML ~~LOC~~ SOLN
25.0000 [IU] | Freq: Every day | SUBCUTANEOUS | Status: DC
  Administered 2016-01-20 – 2016-01-21 (×2): 25 [IU] via SUBCUTANEOUS
  Filled 2016-01-20 (×3): qty 0.25

## 2016-01-20 MED ORDER — TRAMADOL HCL 50 MG PO TABS
50.0000 mg | ORAL_TABLET | Freq: Four times a day (QID) | ORAL | Status: DC | PRN
  Administered 2016-01-20 – 2016-01-22 (×7): 50 mg via ORAL
  Filled 2016-01-20 (×7): qty 1

## 2016-01-20 MED ORDER — INSULIN ASPART 100 UNIT/ML ~~LOC~~ SOLN
0.0000 [IU] | Freq: Three times a day (TID) | SUBCUTANEOUS | Status: DC
Start: 2016-01-20 — End: 2016-01-20
  Administered 2016-01-20: 3 [IU] via SUBCUTANEOUS

## 2016-01-20 MED ORDER — INSULIN ASPART 100 UNIT/ML ~~LOC~~ SOLN
0.0000 [IU] | Freq: Three times a day (TID) | SUBCUTANEOUS | Status: DC
  Administered 2016-01-21: 8 [IU] via SUBCUTANEOUS
  Administered 2016-01-21: 5 [IU] via SUBCUTANEOUS
  Administered 2016-01-21: 3 [IU] via SUBCUTANEOUS
  Administered 2016-01-22: 15 [IU] via SUBCUTANEOUS
  Administered 2016-01-22 (×2): 3 [IU] via SUBCUTANEOUS
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
## 2016-01-20 NOTE — Progress Notes (Signed)
Nurse inquired if I spoke Spanish when entered rm/ Paquito espagnol is all. Pt then received call from his son, so will check back another time.   01/20/16 1600  Clinical Encounter Type  Visited With Patient;Health care provider  Visit Type Initial  Referral From Chaplain   Ephraim Hamburgerynthia A Phoenix Dresser, Chaplain

## 2016-01-20 NOTE — ED Notes (Signed)
Pt. Returned from xray 

## 2016-01-20 NOTE — Progress Notes (Signed)
Pharmacy Antibiotic Note  Mitchell [name] is a 40 y.o. male admitted on 01/20/2016 with cellulitis and possibly osteo of R foot.  Pharmacy has been consulted for vancomycin dosing.  Pt c/o R foot pain x 2 days, PMH DM, has open wound to bottom of foot. Has PMH of osteomyelitis resulting in amputation of R great toe.  Wt 81.2 kg, WBC low at 3.8, pltc low at 86, creat  WNL, afebrile.   R foot xray: cannot exclude subtle changes of osteomyelitis  Plan: Vancomycin 1000 mg IV every 8 hours.  Goal trough 15-20 mcg/mL. Zosyn 3.375g IV q8h (4 hour infusion).  If he does not have osteo, will decrease Vancomycin trough goal to 10-15 mcg/ml F/u renal function, wbc, temp. Vancomycin trough as needed   Temp (24hrs), Avg:98.2 F (36.8 C), Min:98.2 F (36.8 C), Max:98.2 F (36.8 C)   Recent Labs Lab 01/20/16 1035  WBC 3.8*  CREATININE 0.69    Estimated Creatinine Clearance: 116.9 mL/min (by C-G formula based on SCr of 0.69 mg/dL).    No Known Allergies  Mitchell [name], Pharm.D. [phone] 01/20/2016 11:54 AM
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
## 2016-01-21 ENCOUNTER — Inpatient Hospital Stay (HOSPITAL_COMMUNITY): Payer: Self-pay

## 2016-01-21 LAB — CBC
HCT: 42.6 % (ref 39.0–52.0)
Hemoglobin: 15.3 g/dL (ref 13.0–17.0)
MCH: 32 pg (ref 26.0–34.0)
MCHC: 35.9 g/dL (ref 30.0–36.0)
MCV: 89.1 fL (ref 78.0–100.0)
PLATELETS: 81 10*3/uL — AB (ref 150–400)
RBC: 4.78 MIL/uL (ref 4.22–5.81)
RDW: 12.6 % (ref 11.5–15.5)
WBC: 4.1 10*3/uL (ref 4.0–10.5)

## 2016-01-21 LAB — LIPID PANEL
CHOL/HDL RATIO: 4.2 ratio
Cholesterol: 130 mg/dL (ref 0–200)
HDL: 31 mg/dL — AB (ref >40)
LDL CALC: 63 mg/dL (ref 0–99)
TRIGLYCERIDES: 179 mg/dL — AB (ref <150)
VLDL: 36 mg/dL (ref 0–40)

## 2016-01-21 LAB — COMPREHENSIVE METABOLIC PANEL
ALBUMIN: 3.3 g/dL — AB (ref 3.5–5.0)
ALT: 40 U/L (ref 17–63)
ANION GAP: 8 (ref 5–15)
AST: 31 U/L (ref 15–41)
Alkaline Phosphatase: 89 U/L (ref 38–126)
BUN: 6 mg/dL (ref 6–20)
CHLORIDE: 101 mmol/L (ref 101–111)
CO2: 27 mmol/L (ref 22–32)
Calcium: 8.7 mg/dL — ABNORMAL LOW (ref 8.9–10.3)
Creatinine, Ser: 0.66 mg/dL (ref 0.61–1.24)
GFR calc non Af Amer: >60 mL/min (ref >60)
GLUCOSE: 171 mg/dL — AB (ref 65–99)
POTASSIUM: 3.3 mmol/L — AB (ref 3.5–5.1)
SODIUM: 136 mmol/L (ref 135–145)
Total Bilirubin: 1.2 mg/dL (ref 0.3–1.2)
Total Protein: 7.2 g/dL (ref 6.5–8.1)

## 2016-01-21 LAB — GLUCOSE, CAPILLARY
GLUCOSE-CAPILLARY: 286 mg/dL — AB (ref 65–99)
Glucose-Capillary: 145 mg/dL — ABNORMAL HIGH (ref 65–99)
Glucose-Capillary: 163 mg/dL — ABNORMAL HIGH (ref 65–99)
Glucose-Capillary: 169 mg/dL — ABNORMAL HIGH (ref 65–99)
Glucose-Capillary: 215 mg/dL — ABNORMAL HIGH (ref 65–99)
Glucose-Capillary: 245 mg/dL — ABNORMAL HIGH (ref 65–99)

## 2016-01-21 LAB — PROTIME-INR
INR: 1.25
Prothrombin Time: 15.8 seconds — ABNORMAL HIGH (ref 11.4–15.2)

## 2016-01-21 MED ORDER — ACETAMINOPHEN 325 MG PO TABS
650.0000 mg | ORAL_TABLET | Freq: Once | ORAL | Status: AC
  Administered 2016-01-21: 650 mg via ORAL
  Filled 2016-01-21: qty 2

## 2016-01-21 MED ORDER — GADOBENATE DIMEGLUMINE 529 MG/ML IV SOLN
17.0000 mL | Freq: Once | INTRAVENOUS | Status: AC | PRN
  Administered 2016-01-21: 17 mL via INTRAVENOUS
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
## 2016-01-21 NOTE — Progress Notes (Signed)
Patient ID: Mitchell [name], male   DOB: 08/14/1975, 40 y.o.   MRN: [id number] Waiting on MRI . Will discuss results and let patient decide if he wants to proceed.

## 2016-01-21 NOTE — Progress Notes (Addendum)
Patient ID: Mitchell [name], male   DOB: 10/09/1975, 40 y.o.   MRN: [id number] MRI pending. BKA recommended in past by Dr. Lajoyce Cornersuda in May 2017, pt refused. Full consult after MRI done. My cell [phone]

## 2016-01-22 ENCOUNTER — Encounter (INDEPENDENT_AMBULATORY_CARE_PROVIDER_SITE_OTHER): Payer: Self-pay | Admitting: Student

## 2016-01-22 ENCOUNTER — Inpatient Hospital Stay (HOSPITAL_COMMUNITY): Payer: Self-pay

## 2016-01-22 DIAGNOSIS — E1169 Type 2 diabetes mellitus with other specified complication: Secondary | ICD-10-CM

## 2016-01-22 DIAGNOSIS — D696 Thrombocytopenia, unspecified: Secondary | ICD-10-CM | POA: Insufficient documentation

## 2016-01-22 LAB — CBC
HEMATOCRIT: 41.2 % (ref 39.0–52.0)
HEMOGLOBIN: 15.1 g/dL (ref 13.0–17.0)
MCH: 31.7 pg (ref 26.0–34.0)
MCHC: 36.7 g/dL — ABNORMAL HIGH (ref 30.0–36.0)
MCV: 86.6 fL (ref 78.0–100.0)
Platelets: 86 10*3/uL — ABNORMAL LOW (ref 150–400)
RBC: 4.76 MIL/uL (ref 4.22–5.81)
RDW: 12.6 % (ref 11.5–15.5)
WBC: 3.7 10*3/uL — AB (ref 4.0–10.5)

## 2016-01-22 LAB — HEPATITIS B SURFACE ANTIGEN: Hepatitis B Surface Ag: NEGATIVE

## 2016-01-22 LAB — GLUCOSE, CAPILLARY
GLUCOSE-CAPILLARY: 158 mg/dL — AB (ref 65–99)
GLUCOSE-CAPILLARY: 165 mg/dL — AB (ref 65–99)
GLUCOSE-CAPILLARY: 356 mg/dL — AB (ref 65–99)

## 2016-01-22 LAB — BASIC METABOLIC PANEL
ANION GAP: 4 — AB (ref 5–15)
BUN: 5 mg/dL — ABNORMAL LOW (ref 6–20)
CALCIUM: 8.6 mg/dL — AB (ref 8.9–10.3)
CHLORIDE: 98 mmol/L — AB (ref 101–111)
CO2: 32 mmol/L (ref 22–32)
Creatinine, Ser: 0.8 mg/dL (ref 0.61–1.24)
GFR calc non Af Amer: >60 mL/min (ref >60)
Glucose, Bld: 241 mg/dL — ABNORMAL HIGH (ref 65–99)
POTASSIUM: 3.4 mmol/L — AB (ref 3.5–5.1)
Sodium: 134 mmol/L — ABNORMAL LOW (ref 135–145)

## 2016-01-22 LAB — HEPATITIS B SURFACE ANTIBODY, QUANTITATIVE: Hepatitis B-Post: <3.1 m[IU]/mL — ABNORMAL LOW (ref >9.9)

## 2016-01-22 LAB — HCV COMMENT:

## 2016-01-22 LAB — HEPATITIS B CORE ANTIBODY, IGM: HEP B C IGM: NEGATIVE

## 2016-01-22 LAB — HEPATITIS C ANTIBODY (REFLEX): HCV AB: 0.1 {s_co_ratio} (ref 0.0–0.9)

## 2016-01-22 LAB — HEPATITIS A ANTIBODY, IGM: HEP A IGM: NEGATIVE

## 2016-01-22 MED ORDER — CIPROFLOXACIN HCL 750 MG PO TABS: 750.0000 mg | ORAL_TABLET | Freq: Two times a day (BID) | ORAL | 0 refills | Status: AC

## 2016-01-22 MED ORDER — TRAMADOL HCL 50 MG PO TABS: 50.0000 mg | ORAL_TABLET | Freq: Four times a day (QID) | ORAL | 0 refills | Status: DC | PRN

## 2016-01-22 MED ORDER — AMOXICILLIN-POT CLAVULANATE 875-125 MG PO TABS: 1.0000 | ORAL_TABLET | Freq: Two times a day (BID) | ORAL | 0 refills | Status: DC

## 2016-01-22 MED ORDER — INFLUENZA VAC SPLIT QUAD 0.5 ML IM SUSY
0.5000 mL | PREFILLED_SYRINGE | Freq: Once | INTRAMUSCULAR | Status: AC
  Administered 2016-01-22: 0.5 mL via INTRAMUSCULAR
  Filled 2016-01-22: qty 0.5

## 2016-01-22 MED ORDER — INFLUENZA VAC SPLIT QUAD 0.5 ML IM SUSY: 0.5000 mL | PREFILLED_SYRINGE | INTRAMUSCULAR | Status: DC

## 2016-01-22 NOTE — Progress Notes (Signed)
VASCULAR LAB PRELIMINARY  ARTERIAL  ABI completed: Right ATA no ascertainable waveform obtained to obtain pressure. This may be due to difficulty of test from patients skin/ anatomy. ABI from right PTA pressure is within normal limits. Left ABI within normal limits.     RIGHT    LEFT    PRESSURE WAVEFORM  PRESSURE WAVEFORM  BRACHIAL 142 Tri BRACHIAL 147 Tri  AT Not obtained  AT 161 Tri  PT 161 Tri PT 168 Tri    RIGHT LEFT  ABI 1.1 1.14    Farrel DemarkJill Eunice, RDMS, RVT   01/22/2016, 3:16 PM

## 2016-01-22 NOTE — Discharge Instructions (Signed)
Gripe en los adultos (Influenza, Adult) La gripe es una infeccin viral que afecta principalmente las vas respiratorias. Las vas respiratorias incluyen rganos que ayudan a Industrial/product designer, [employer], la nariz y Administrator. La gripe provoca muchos sntomas del resfro comn, as como fiebre alta y Tourist information centre manager. Se transmite fcilmente de persona a persona (es contagiosa). La mejor manera de prevenir la gripe es aplicndose la vacuna contra la gripe todos los aos. CAUSAS La gripe es causada por un virus. Se puede contagiar el virus de las siguientes [place]:  Al aspirar las gotitas que una persona infectada elimina al toser o Engineering geologist.  Al tocar algo que fue recientemente contaminado con el virus y [facility] [facility] mano a la boca, la nariz o los ojos. FACTORES DE RIESGO Los siguientes factores pueden hacer que usted sea propenso a Primary school teacher la gripe:  No lavarse las manos frecuentemente con agua y [place] o un desinfectante de manos a base de alcohol.  Tener contacto cercano con [employer] durante la temporada de resfro y gripe.  Tocarse la boca, los ojos o la nariz sin lavarse ni desinfectarse las manos primero.  No beber suficientes lquidos o no seguir una dieta saludable.  No dormir lo suficiente o no hacer suficiente actividad fsica.  Tener un alto grado de estrs.  No colocarse la vacuna anual contra la gripe. Puede correr un mayor riesgo de complicaciones de la gripe, como una infeccin pulmonar grave (neumona) si:  Tiene ms de 64aos.  Est embarazada.  Tiene un sistema que combate las defensas (sistema inmunitario) debilitado. Puede tener un sistema inmunitario debilitado si:  Tiene VIH o sida.  Est recibiendo quimioterapia.  [place] medicamentos que reducen [employer] (suprimen) del sistema inmunitario.  Tiene una enfermedad a largo plazo (crnica), como cardiopata coronaria, enfermedad renal, diabetes o enfermedad pulmonar.  Tiene un trastorno  heptico.  Son obesas.  Tiene anemia. SNTOMAS Los sntomas de esta afeccin por lo general duran entre 4 y 45das, y Conservator, museum/gallery los siguientes sntomas:  Grant Ruts.  Escalofros.  Dolor de [place], dolores en el cuerpo o dolores musculares.  Dolor de Advertising copywriter.  Tos.  Secrecin o congestin nasal.  Molestias en el pecho y tos.  Prdida del apetito.  Debilidad o cansancio (fatiga).  Mareos.  Nuseas o vmitos. DIAGNSTICO Esta afeccin se puede diagnosticar en funcin de los antecedentes mdicos y un examen fsico. El mdico puede indicarle un cultivo farngeo o nasal para confirmar el diagnstico. TRATAMIENTO Si la gripe se detecta de manera temprana, puede recibir tratamiento con medicamentos antivirales que pueden reducir la duracin de la enfermedad y la gravedad de los sntomas. Este medicamento se puede administrar por va oral o por va intravenosa (IV), es decir, a travs de un tubo que se coloca en una vena. El objetivo del tratamiento es aliviar los sntomas cuidndose en su casa. Esto puede incluir usar medicamentos de venta libre, beber una gran cantidad de lquidos y agregar humedad al aire en su casa. En algunos casos, la gripe se cura sin tratamiento. Los casos graves o las complicaciones de gripe se pueden tratar en un hospital. INSTRUCCIONES PARA EL CUIDADO EN EL [employer] medicamentos de venta libre y los recetados solamente como se lo haya indicado el mdico.  Use un humidificador de aire fro para agregar humedad al aire de su casa. Esto puede ayudarlo a Solicitor.  Descanse todo lo que sea necesario.  Beba suficiente lquido para Photographer orina clara o de color  amarillo plido.  Al toser o estornudar, cbrase la boca y la [place].  Lvese las manos con agua y jabn frecuentemente, en especial despus de toser o Engineering geologistestornudar. Use desinfectante para manos si no dispone de [place] y [place].  Lanny HurstQudese en su casa y no concurra al Aleen Campitrabajo o a la  escuela, como se lo haya indicado el mdico. A menos que visite al American Expressmdico, evite salir de su casa hasta que no tenga fiebre durante 24horas sin el uso de medicamentos.  Concurra a todas las visitas de control como se lo haya indicado el mdico. Esto es importante. PREVENCIN  Aplicarse la vacuna anual contra la gripe es la mejor manera de evitar contagiarse la gripe. Puede aplicarse la vacuna contra la gripe a fines de verano, en otoo o en invierno. Pregntele al mdico cundo debe aplicarse la vacuna contra la gripe.  Lvese las manos o use un desinfectante de manos con frecuencia.  Evite el contacto con personas que estn enfermas durante la temporada de resfro y gripe.  Siga una dieta saludable, beba muchos lquidos, duerma lo suficiente y realice actividad fsica con regularidad. SOLICITE ATENCIN MDICA SI:  Presenta nuevos sntomas.  Tiene los siguientes sntomas:  Journalist, newspaperDolor en el pecho.  Diarrea.  Grant RutsFiebre.  La tos empeora.  Produce ms mucosidad.  Siente nuseas o vomita. SOLICITE ATENCIN MDICA DE INMEDIATO SI:  Le falta el aire o tiene dificultad para respirar.  La piel o las uas se tornan de un color Tchulaazulado.  Presenta dolor intenso o rigidez de cuello.  Le duele la cabeza de forma repentina o tiene dolor en la cara o el odo.  No puede dejar de vomitar. Esta informacin no tiene Theme park managercomo fin reemplazar el consejo del mdico. Asegrese de hacerle al mdico cualquier pregunta que tenga. Document Released: 10/21/2004 Document Revised: 05/05/2015 Document Reviewed: 11/05/2014 Elsevier Interactive Patient Education  2017 Elsevier Inc. Tramadol tablets Qu es este medicamento? El TRAMADOL es un analgsico. Se utiliza para tratar dolores moderados o severos en adultos. Este medicamento puede ser utilizado para otros usos; si tiene alguna pregunta consulte con su proveedor de atencin mdica o con su farmacutico. MARCAS COMUNES: Ultram Qu le debo informar a mi  profesional de la salud antes de tomar este medicamento? Necesita saber si usted presenta alguno de los Coventry Health Caresiguientes problemas o situaciones: -tumor cerebral -depresin -abuso de drogas o drogadiccin -lesin de la cabeza -si consume bebidas alcohlicas con frecuencia -enfermedad renal o problemas al orinar -enfermedad heptica -enfermedad pulmonar, asma o problemas respiratorios -convulsiones o epilepsia -ideas suicidas, planes o intento; si usted o alguien de su familia ha intentado un suicidio previo -una reaccin alrgica o inusual al tramadol, a la codena, a otros medicamentos, alimentos, colorantes o conservantes -si est embarazada o buscando quedar embarazada -si est amamantando a un beb Cmo debo utilizar este medicamento? Tome este medicamento por va oral con un vaso lleno de agua. Siga las instrucciones de la etiqueta del Alverdamedicamento. Si el Social workermedicamento le produce malestar estomacal, tmelo con alimentos o con [place]. No tome su medicamento con una frecuencia mayor que la indicada. Hable con su pediatra para informarse acerca del uso de este medicamento en nios. Puede requerir atencin especial. Sobredosis: Pngase en contacto inmediatamente con un centro toxicolgico o una sala de urgencia si usted cree que haya tomado demasiado medicamento. ATENCIN: Reynolds AmericanEste medicamento es solo para usted. No comparta este medicamento con nadie. Qu sucede si me olvido de una dosis? Si olvida una dosis, tmela lo antes posible. Si  es casi la hora de la prxima dosis, tome slo esa dosis. No tome dosis adicionales o dobles. Qu puede interactuar con este medicamento? No tome esta medicina con ninguno de los siguientes medicamentos: -IMAOs, tales como Carbex, Eldepryl, Marplan, Nardil y Parnate Esta medicina tambin puede interactuar con los siguientes medicamentos: -alcohol o medicamentos que contienen alcohol -antihistamnicos -benzodiacepinas -bupropion -carbamazepina u  oxcarbazepina -clozapina -ciclobenzaprina -digoxina -furazolidona -linezolid -medicamentos para la depresin, ansiedad o trastornos psicticos -medicamentos para las migraas, tales como almotriptn, eletriptn, frovatriptn, naratriptn, rizatriptn, sumatriptn, zolmitriptn -analgsicos, incluyendo pentazocina, buprenorfina, butorfanol, meperidina, nalbufina y propoxifeno -medicamentos para conciliar el sueo -relajantes musculares -naltrexona -fenobarbital -fenotiazinas, tales como perfenacina, tioridazina, clorpromacina, mesoridazina, flufenazina, proclorperazina, promazina y trifluoperazina -procarbazina -warfarina Puede ser que esta lista no menciona todas las posibles interacciones. Informe a su profesional de [employer] de [employer] productos a base de hierbas, medicamentos de [place] o suplementos nutritivos que est tomando. Si usted fuma, consume bebidas alcohlicas o si utiliza drogas ilegales, indqueselo tambin a su profesional de [employer]. Algunas sustancias pueden interactuar con su medicamento. A qu debo estar atento al usar [employer] Corporation? Si el dolor no desaparece, si empeora o si experimenta un dolor nuevo o de tipo diferente, consulte a su mdico o a su profesional de [employer]. Usted puede desarrollar tolerancia al medicamento. La tolerancia significa que necesitar una dosis ms alta para Engineer, materials. Tolerancia es normal y esperada cuando est tomando este medicamento por un largo perodo de [place]. No suspenda el uso de su medicamento repentinamente debido a que puede Copywriter, advertising reaccin severa. Su cuerpo se acostumbra a Industrial/product designer. Esto NO significa que sea adicto. La adiccin es un comportamiento que hace referencia a la obtencin y utilizacin de un medicamento con fines que no son mdicos. Si tiene Engineer, mining, existe una razn mdica para que usted tome un analgsico. Su mdico le indicar la cantidad de medicamento que Mudlogger. Si su mdico  desea que [employer], la dosis ser reducida gradualmente para Psychiatric nurse secundarios. Puede experimentar mareos o somnolencia. No conduzca ni utilice maquinaria, ni haga nada que Scientist, research (life sciences) en estado de alerta hasta que sepa cmo le afecta este medicamento. No se siente ni se ponga de pie con rapidez, especialmente si es un paciente de edad avanzada. Esto reduce el riesgo de mareos o [employer]. El alcohol puede aumentar o disminuir el efecto de [place]. Evite consumir bebidas alcohlicas. Este medicamento puede causar estreimiento. Trate de evacuar los intestinos al menos cada 2  3 das. Si no evacua los intestinos durante [address], comunquese con su mdico o con su profesional de [employer]. Se le podr secar la boca. Masticar chicle sin azcar, chupar caramelos duros y tomar agua en abundancia le ayudar a mantener la boca hmeda. Si el problema no desaparece o es severo, consulte a su mdico. Qu efectos secundarios puedo tener al [employer] este medicamento? Efectos secundarios que debe informar a su mdico o a Producer, television/film/video de la salud tan pronto como sea posible: Therapist, art, como erupcin cutnea, comezn/picazn o urticarias, hinchazn de la cara, los labios o la lengua problemas respiratorios confusin convulsiones signos y sntomas de presin sangunea baja, tales como [place], sensacin de [place] o aturdimiento, cadas, cansancio o debilidad inusual dificultad para orinar o cambios en el volumen de orina Efectos secundarios que generalmente no requieren atencin mdica (debe informarlos a su mdico o a su profesional de la salud si persisten o si son molestos): estreimiento  boca seca nuseas, vmito cansancio Puede ser que esta lista no menciona todos los posibles efectos secundarios. Comunquese a su mdico por asesoramiento mdico Hewlett-Packardsobre los efectos secundarios. Usted puede informar los efectos secundarios a la FDA por telfono al 1-800-FDA-1088. Dnde  debo guardar mi medicina? Mantngala fuera del alcance de los nios. Gurdela a Sanmina-SCItemperatura ambiente, entre 15 y 30 grados C ([phone] y [phone] grados F). Mantenga el envase bien cerrado. Deseche los medicamentos que no haya utilizado, despus de la fecha de vencimiento. ATENCIN: Este folleto es un resumen. Puede ser que no cubra toda la posible informacin. Si usted tiene preguntas acerca de esta medicina, consulte con su mdico, su farmacutico o su profesional de Radiographer, therapeuticla salud.  2017 Elsevier/Gold Standard (2014-11-13 00:00:00)
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
## 2016-01-22 NOTE — Care Management Note (Signed)
Case Management Note  Patient Details  Name: Mitchell [name] MRN: [id number] Date of Birth: 12/06/1975  Subjective/Objective:           CM following for progression and d/c planning.          Action/Plan: 01/22/2016 Plan for pt to d/c to home with po antibiotics. Noted pt had MATCH letter on 06/16/2015, have placed call asking for an override to obtain antibiotics as [facility] and would not be eligible for assistance. Await decision re override. Will continue to follow.   Expected Discharge Date:                  Expected Discharge Plan:  Home w Home Health Services  In-House Referral:  NA  Discharge planning Services  CM Consult, Medication Assistance, [facility]  Post Acute Care Choice:  NA Choice offered to:     DME Arranged:    DME Agency:     HH Arranged:    HH Agency:     Status of Service:  In process, will continue to follow  If discussed at Long Length of Stay Meetings, dates discussed:    Additional Comments:  Starlyn SkeansRoyal, Talan Gildner U, RN 01/22/2016, 11:23 AM

## 2016-01-22 NOTE — Care Management Note (Signed)
Case Management Note  Patient Details  Name: [name] MRN: [id number] Date of Birth: 08/25/1975  Subjective/Objective:       CM following for progression and d/c planning.             Action/Plan: 01/22/2016 Noted pt need for po antibiotics, pt is uninsured and has received MATCH letter in the past 7 months. [facility] by CM assist director, Nicolasa DuckingSally Holland. Letter explaining MATCH  was given to pt RN Cierra on 01/22/2016 @  2:45pm.  Letter was both [place] and [place] along with a list of pharmacies that participate [facility]. This will be given to the pt at the time of d/c.   Expected Discharge Date:    01/23/2016              Expected Discharge Plan:  Home/Self Care  In-House Referral:  NA  Discharge planning Services  CM Consult, Medication Assistance, MATCH Program  Post Acute Care Choice:  NA Choice offered to:  NA  DME Arranged:  N/A DME Agency:  NA  HH Arranged:  NA HH Agency:  NA  Status of Service:  Completed, signed off  If discussed at Long Length of Stay Meetings, dates discussed:    Additional Comments:  Starlyn SkeansRoyal, Arthuro Canelo U, RN 01/22/2016, 2:59 PM
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
## 2016-01-22 NOTE — Progress Notes (Signed)
Page to Md after NPO order came through this morning. Patient as eaten breakfast. MD Chanetta Marshallimberlake called to tell RN that it is okay that patient has eaten and from now on to make NOP, except meds, and insulin. Possible surgery this late afternoon. Rn will tell patient, and take NPO

## 2016-01-22 NOTE — Progress Notes (Signed)
Rn paged to remind MD about Discharge summary, MD aware and will complete within 24 hours. Patient has been discharged

## 2016-01-22 NOTE — Progress Notes (Signed)
RN Rosana BergerBelton went over discharge information with patient, patient's wife and son. RN emphasized importance of patient taking antibiotics to help decrease chances of infection. Patient verbalized understanding. Patient also given pharmacy form from case manager so patient can get antibiotics, and tramadol prescription. Rn discontinued Iv, will transport patient to valet where family will pick up. Will continue to monitor.

## 2016-01-25 LAB — CULTURE, BLOOD (ROUTINE X 2)
CULTURE: NO GROWTH
Culture: NO GROWTH

## 2016-02-25 ENCOUNTER — Inpatient Hospital Stay: Payer: Self-pay | Admitting: Infectious Diseases

## 2016-03-10 ENCOUNTER — Inpatient Hospital Stay: Payer: Self-pay | Admitting: Infectious Diseases

## 2016-04-08 ENCOUNTER — Other Ambulatory Visit: Payer: Self-pay | Admitting: Student

## 2016-04-08 DIAGNOSIS — M792 Neuralgia and neuritis, unspecified: Secondary | ICD-10-CM

## 2016-04-08 NOTE — Telephone Encounter (Signed)
Left voice mail for the patient to call office and verify which medicine he takes, Cymbalta or Tramadol? He shouldn't be on both due to Increased risk for Serotonin Syndrome.  [employer] interpretor with ID [id number] used.

## 2016-05-20 ENCOUNTER — Ambulatory Visit: Payer: Self-pay

## 2016-07-15 NOTE — Telephone Encounter (Signed)
LVM for pt to call the office. Mitchell [name], CMA  

## 2016-11-08 ENCOUNTER — Ambulatory Visit (INDEPENDENT_AMBULATORY_CARE_PROVIDER_SITE_OTHER): Payer: Self-pay | Admitting: Family Medicine

## 2016-11-08 ENCOUNTER — Ambulatory Visit (HOSPITAL_COMMUNITY)
Admission: RE | Admit: 2016-11-08 | Discharge: 2016-11-08 | Disposition: A | Discharge status: Home / Self Care | Payer: Self-pay | Source: Ambulatory Visit | Attending: Family Medicine | Admitting: Family Medicine

## 2016-11-08 ENCOUNTER — Encounter: Payer: Self-pay | Admitting: Family Medicine

## 2016-11-08 ENCOUNTER — Encounter: Payer: Self-pay | Admitting: Psychology

## 2016-11-08 VITALS — BP 100/62 | HR 76 | Temp 97.4°F | Ht 64.0 in | Wt 146.0 lb

## 2016-11-08 DIAGNOSIS — E118 Type 2 diabetes mellitus with unspecified complications: Secondary | ICD-10-CM

## 2016-11-08 DIAGNOSIS — R9389 Abnormal findings on diagnostic imaging of other specified body structures: Secondary | ICD-10-CM | POA: Insufficient documentation

## 2016-11-08 DIAGNOSIS — Z794 Long term (current) use of insulin: Secondary | ICD-10-CM

## 2016-11-08 DIAGNOSIS — E119 Type 2 diabetes mellitus without complications: Secondary | ICD-10-CM

## 2016-11-08 DIAGNOSIS — Z89411 Acquired absence of right great toe: Secondary | ICD-10-CM | POA: Insufficient documentation

## 2016-11-08 DIAGNOSIS — Z9889 Other specified postprocedural states: Secondary | ICD-10-CM | POA: Insufficient documentation

## 2016-11-08 DIAGNOSIS — E11621 Type 2 diabetes mellitus with foot ulcer: Secondary | ICD-10-CM

## 2016-11-08 DIAGNOSIS — M7989 Other specified soft tissue disorders: Secondary | ICD-10-CM | POA: Insufficient documentation

## 2016-11-08 DIAGNOSIS — L97419 Non-pressure chronic ulcer of right heel and midfoot with unspecified severity: Secondary | ICD-10-CM

## 2016-11-08 DIAGNOSIS — S91301A Unspecified open wound, right foot, initial encounter: Secondary | ICD-10-CM

## 2016-11-08 LAB — POCT GLYCOSYLATED HEMOGLOBIN (HGB A1C): Hemoglobin A1C: 12.7

## 2016-11-08 MED ORDER — CIPROFLOXACIN HCL 750 MG PO TABS: 750.0000 mg | ORAL_TABLET | Freq: Two times a day (BID) | ORAL | 0 refills | Status: AC

## 2016-11-08 MED ORDER — METFORMIN HCL 1000 MG PO TABS: 1000.0000 mg | ORAL_TABLET | Freq: Two times a day (BID) | ORAL | 2 refills | Status: DC

## 2016-11-08 MED ORDER — INSULIN GLARGINE 100 UNIT/ML ~~LOC~~ SOLN: 50.0000 [IU] | Freq: Every day | SUBCUTANEOUS | 0 refills | Status: DC

## 2016-11-08 NOTE — Progress Notes (Signed)
Subjective:   Patient ID: Mitchell [name]    DOB: 1975/05/07, 41 y.o. male   MRN: [id number]  Mitchell [name] is a 41 y.o. male with a history of type 2 diabetes, depression, alcohol dependence here for   Diabetes, Type 2 - Last A1c 7.8 11/2015 - A1c today 12.7 - Medications: Lantus 42u qhs, metformin  BID - Compliance: has been out of medications for the last 3 months as [place] - Checking BG at home: 350-500 recently - Diet: consists of sugary foods, knows this is an area he needs to work on - Eye exam: never had - Foot exam: last 2017 - Microalbumin: 0.4 08/2015 - Endorses symptoms of hunger, numbness/tingling of extremities, current open sore of R foot.  Open foot wound - States has had R foot wound for 15 days and began draining 4 days ago - Extremely sensitive to light touch and hurts to put pressure or walk on it - Denies fevers, N/V, extreme pain or redness/swelling tracking up R leg. - wants a note to be out of work - Is very concerned he may have to have foot amputated. - see picture below  Review of Systems:  Per HPI.   PMFSH: reviewed. Smoking status reviewed. Medications reviewed.  Objective:   BP 100/62   Pulse 76   Temp (!) 97.4 F (36.3 C) (Oral)   Ht  (1.626 m)   Wt 146 lb (66.2 kg)   SpO2 98%   BMI 25.06 kg/m  Vitals and nursing note reviewed.  General: well nourished, well developed, in mild acute distress with non-toxic appearance HEENT: normocephalic, atraumatic, moist mucous membranes Neck: supple, non-tender without lymphadenopathy CV: regular rate and rhythm without murmurs, rubs, or gallops Lungs: clear to auscultation bilaterally with normal work of breathing Extremities: warm, dry. R leg with chronic vascular color changes. R great toe amputation. R dorsalis pedal pulse not appreciated. Open sore draining on outside of R foot (see picture below) with firm edges. No bone involvement visible or palpable. Extremely  tender to light touch. L foot within normal limits. Neuropathy present bilaterally.      Assessment & Plan:   Diabetes mellitus type 2 with complications (HCC) A1c today 12.7. Has been without medications for the last 3 months due to moving out of state. Current regimen includes Lantus 50u qhs, Metformin  BID. CBG at home 350-500. Completed foot exam today.  - Restarted home medications.  - Warm hand off with [facility] today to assess for barriers to care, see separate note. - Follow up in 2 weeks  Diabetic foot ulcer (HCC) Open sore draining on outside of R foot with firm edges (see picture).  No bone involvement visible or palpable. Extremely tender to light touch. Pt states present for 15 days, began draining 4 days ago. - R foot xray to assess for bone involvement - Ciprofloxacin  BID x7days - restart diabetes medications - follow up in 2 weeks  Orders Placed This Encounter  Procedures  . DG Foot Complete Right    Standing Status:   Future    Number of Occurrences:   1    Standing Expiration Date:   01/08/2018    Order Specific Question:   Reason for Exam (SYMPTOM  OR DIAGNOSIS REQUIRED)    Answer:   R foot wound, diabetes    Order Specific Question:   Preferred imaging location?    Answer:   [facility]    Order Specific  Question:   Radiology Contrast Protocol - do NOT remove file path    Answer:   [email or web address].[email or web address]  . POCT glycosylated hemoglobin (Hb A1C)   Meds ordered this encounter  Medications  . metFORMIN (GLUCOPHAGE) 1000 MG tablet    Sig: Take 1 tablet (1,000 mg total) by mouth 2 (two) times daily with a meal.    Dispense:  180 tablet    Refill:  2    Please consider 90 day supplies to promote better adherence  . insulin glargine (LANTUS) 100 UNIT/ML injection    Sig: Inject 0.5 mLs (50 Units total) into the skin at bedtime.    Dispense:  20 mL    Refill:  0    Order Specific Question:   Lot  Number?    Answer:   [id number]    Order Specific Question:   Expiration Date?    Answer:   07/24/2017    Order Specific Question:   Manufacturer?    Answer:   [employer] [26]    Order Specific Question:   NDC    Answer:   0454-0981-19 [[id number]]    Order Specific Question:   Quantity    Answer:   2  . ciprofloxacin (CIPRO) 750 MG tablet    Sig: Take 1 tablet (750 mg total) by mouth 2 (two) times daily.    Dispense:  14 tablet    Refill:  0    Ellwood Dense, DO PGY-1, [facility] 11/08/2016 12:32 PM

## 2016-11-08 NOTE — Patient Instructions (Addendum)
IEstuvo muy bien verte!  Para la infeccin de tu pie, - Estamos recibiendo una radiografa para asegurarnos de que la infeccin no se haya propagado al [employer]. - Estamos prescribiendo un antibitico. - Estoy reiniciando sus medicamentos para la diabetes. - Seguimiento en 2-3 semanas.  Cudese y busque atencin inmediata antes si tiene alguna inquietud.  Ellwood Dense, DO [facility]   Diabetes y cuidados del pie (Diabetes and Foot Care) La diabetes puede ser la causa de que el flujo sanguneo (circulacin) en las piernas y los pies sea deficiente. Debido a esto, la piel de los pies se torna ms delgada, se rompe con facilidad y se cura ms lentamente. La piel puede estar seca, despellejarse y Lobbyist. Tambin pueden estar daados los nervios de las piernas y de los pies lo que provoca una disminucin de la sensibilidad. Es posible que no advierta heridas ms pequeas en los pies, que pueden causar infecciones graves. Cuidar sus pies es una de las cosas ms importantes que puede hacer por usted mismo. INSTRUCCIONES PARA EL CUIDADO EN EL HOGAR  Use siempre calzado, an dentro de su casa. No camine descalzo. Caminar descalzo facilita que se lastime.  Controle sus pies diariamente para observar ampollas, cortes y enrojecimiento. Si no puede ver la planta del pie, use un espejo o pdale ayuda a Engineer, maintenance (IT).  Lave sus pies con agua tibia (no use agua caliente) y un [place]. Seque bien sus pies, y la zona [employer] dedos dando [place], hasta que estn completamente secos. Noremoje los pies, ya que esto puede resecar la piel.  Aplique una locin hidratante o vaselina (que no contenga alcohol ni perfume) en los pies y en las uas secas y [place]. No aplique locin entre los dedos.  Recorte las uas en forma recta. No escarbe debajo de las uas o alrededor [employer]. Lime los bordes de las uas con una lima o esmeril.  No corte las durezas o callosidades, ni trate  de quitarlas con medicamentos.  Use calcetines de algodn o medias [employer]. Asegrese de que no le [employer]. Nouse calcetines que le lleguen a las rodillas, ya que podran disminuir el flujo de sangre a las piernas.  Use zapatos de cuero que le queden bien y que sean acolchados. Para amoldar los zapatos, clcelos slo algunas horas por da. Esto evitar lesiones en los pies. Revise siempre los zapatos antes de ponerlos para asegurarse de que no haya objetos en su interior.  No cruce las piernas. Esto puede disminuir el flujo de sangre a los pies.  Si algo le ha raspado, cortado o lastimado la piel de los pies, mantenga la piel de esa zona limpia y [place]. Debe higienizar estas zonas con agua y un jabn suave. No limpie la zona con agua oxigenada, alcohol ni yodo.  Cuando se quite un vendaje adhesivo, asegrese de no daar la piel.  Si tiene una herida, obsrvela varias veces por da para asegurarse de que se est curando.  No use bolsas de agua caliente ni almohadillas trmicas. Podran causar quemaduras. Si ha perdido la sensibilidad en los pies o las piernas, no sabr lo que le est sucediendo hasta que sea demasiado tarde.  Asegrese de que su mdico le haga un examen completo de los pies por lo menos una vez al ao, o con ms frecuencia si usted tiene [employer]. Informe todos los cortes, llagas o moretones a su mdico inmediatamente.  SOLICITE ATENCIN MDICA SI:  Tiene una lesin que no se [place].  Tiene cortes o rajaduras en la piel.  Tiene una ua encarnada.  Nota una zona irritada en las piernas o los pies.  Siente una sensacin de ardor u hormigueo en las piernas o los pies.  Siente dolor o calambres en las piernas o los pies.  Las piernas o los pies estn adormecidos.  Siente los pies siempre fros.  SOLICITE ATENCIN MDICA DE INMEDIATO SI:  Presenta enrojecimiento, hinchazn o aumento del dolor en una herida.  Nota una lnea roja que  sube por pierna.  Aparece pus en la herida.  Le sube la fiebre o segn lo que le indique el mdico.  Advierte un olor ftido que proviene de una lcera o una herida.  Esta informacin no tiene Theme park manager el consejo del mdico. Asegrese de hacerle al mdico cualquier pregunta que tenga. Document Released: 01/11/2005 Document Revised: 05/05/2015 Document Reviewed: 06/20/2012 Elsevier Interactive Patient Education  2017 [employer].

## 2016-11-08 NOTE — Assessment & Plan Note (Addendum)
Open sore draining on outside of R foot with firm edges (see picture).  No bone involvement visible or palpable. Extremely tender to light touch. Pt states present for 15 days, began draining 4 days ago. - R foot xray to assess for bone involvement - Ciprofloxacin  BID x7days - restart diabetes medications - follow up in 2 weeks

## 2016-11-08 NOTE — Progress Notes (Signed)
Dr. Linwood Dibbles requested a Behavioral Health Consult in connection with this patient's uncontrolled diabetes.  Presenting Issue:  Uncontrolled diabetes  Report of symptoms:  Hba1c of 12.7  Duration of CURRENT symptoms:  Around three months  Through an interpreter, I asked [name] for permission to discuss ideas to bring down his Hba1c. I presented a menu of options (eating changes, more activity, more monitoring, medication adherence). He was able to identify his diet and medication as areas to improve. Because he has not been taking his medication for three months, he is hungry all the time. He will take his medication regularly now that he is back in [place]. He was working in a remote location and could not obtain his medication previously. He came up with the idea to bring his own food to work (he now works in [employer]) so that he will not be tempted to eat sweets and baked goods. He also agreed to try to eat a healthy meal containing protein and vegetables before going to work.  DDS: (1) 3 (2) 5 (3) 2 (4) 2 (5) 1 (6) 1 (7) 5 (8) na (9) 1 (10)1 (11) 1 (12) na (13) 5 (14) 1 (15) 1 (16) 1 (17) 5    Assessment / Plan / Recommendations: This patient has poorly controlled diabetes so Dr. Linwood Dibbles gave me the opportunity to engage in some Motivational Interviewing. [name] stated that he will take his medication regularly, bring his own food to work, and try to eat a healthy meal (protein and vegetables) before work. This will, hopefully, bring down his Hba1c. His diabetes distress scale score was 32 even though he omitted a couple of items. Significantly, he endorsed  concern regarding care for his diabetes (5). He also feels that his family is not supportive (5) of his self-care efforts (diet etc..) Finally, he feels that his diabetes takes up too much of his mental and physical energy (3). I invited him to return for a follow-up visit with [facility] at his  convenience.  Warmhandoff complete.

## 2016-11-08 NOTE — Assessment & Plan Note (Addendum)
Assessment / Plan / Recommendations: This patient has poorly controlled diabetes so Dr. Linwood [name] gave me the opportunity to engage in some Motivational Interviewing. [name] stated that he will take his medication regularly, bring his own food to work, and try to eat a healthy meal (protein and vegetables) before work. This will, hopefully, bring down his Hba1c. His diabetes distress scale score was 32 even though he omitted a couple of items. Significantly, he endorsed  concern regarding care for his diabetes (5). He also feels that his family is not supportive (5) of his self-care efforts (diet etc..) Finally, he feels that his diabetes takes up too much of his mental and physical energy (3). I invited him to return for a follow-up visit with [facility] at his convenience.

## 2016-11-08 NOTE — Assessment & Plan Note (Signed)
A1c today 12.7. Has been without medications for the last 3 months due to [place] of [place]. Current regimen includes Lantus 50u qhs, Metformin  BID. CBG at home 350-500. Completed foot exam today.  - Restarted home medications.  - Warm hand off with [facility] today to assess for barriers to care, see separate note. - Follow up in 2 weeks

## 2016-11-18 ENCOUNTER — Ambulatory Visit (INDEPENDENT_AMBULATORY_CARE_PROVIDER_SITE_OTHER): Payer: Self-pay | Admitting: Internal Medicine

## 2016-11-18 DIAGNOSIS — Z794 Long term (current) use of insulin: Secondary | ICD-10-CM

## 2016-11-18 DIAGNOSIS — E118 Type 2 diabetes mellitus with unspecified complications: Secondary | ICD-10-CM

## 2016-11-18 DIAGNOSIS — L97419 Non-pressure chronic ulcer of right heel and midfoot with unspecified severity: Secondary | ICD-10-CM

## 2016-11-18 DIAGNOSIS — E11621 Type 2 diabetes mellitus with foot ulcer: Secondary | ICD-10-CM

## 2016-11-18 MED ORDER — INSULIN GLARGINE 100 UNIT/ML ~~LOC~~ SOLN: 55.0000 [IU] | Freq: Every day | SUBCUTANEOUS | 0 refills | Status: DC

## 2016-11-18 MED ORDER — CIPROFLOXACIN HCL 500 MG PO TABS: 500.0000 mg | ORAL_TABLET | Freq: Two times a day (BID) | ORAL | 0 refills | Status: DC

## 2016-11-18 NOTE — Patient Instructions (Signed)
Fue Psychiatristun placer conocerte! He recetado otros 7 das del antibitico (Ciprofloxacina). Por favor, siga tomando OGE Energyesto dos veces al da. Tambin recomendara aumentar su Lantus de 50 unidades diarias a 55 unidades diarias. Por favor vuelve a vernos en 1 semana.

## 2016-11-19 NOTE — Assessment & Plan Note (Signed)
Seems to be improving with current antibiotic, although healing will likely take a very long time. - Will extend antibiotic duration for 7 more days (total of 14 days). Prescribed Cipro bid x 7 additional days - Needs podiatry or vascular follow-up, but patient is self pay and states he cannot afford this - Work note provided to keep patient out of work until Monday due to pain - Patient needs close monitoring- appointment scheduled with PCP in 1 week.

## 2016-11-19 NOTE — Progress Notes (Signed)
   [facility] Phone: [phone]  Subjective:  Mitchell RalphsCesar is a 41 year old male presenting to clinic for follow-up of an infected diabetic foot ulcer. He was seen in clinic on 11/08/16 when an open ulcer on the outside of his right foot. He had also stopped taking his Lantus and Metformin for the previous 3 months while he was out of the state. Patient was prescribed Ciprofloxacin bid x 7 days. He states he had a little bit of trouble picking up the medication, so he has not yet finished the antibiotic course. He thinks he has about 2 days left. He feels like the foot is getting a little bit better. It has stopped draining as much. He still feels that "there is a lot of inflammation in his foot". He denies any fevers. He states he has restarted his Lantus and Metformin and his blood sugars have been coming down. CBGs have been ranging from 190-210. They were previously in the 300s while he was not on any medications.  ROS: See HPI for pertinent positives and negatives  Past Medical History- poorly controlled T2DM, hx of alcohol dependence, depression, thrombocytopenia, anemia  Family history reviewed for today's visit. No changes.  Social history- patient is a never smoker. Hx of alcohol abuse.  Objective: BP 140/80   Pulse 83   Temp 98.2 F (36.8 C) (Oral)   Ht 5\' 4"  (1.626 m)   Wt 149 lb (67.6 kg)   SpO2 98%   BMI 25.58 kg/m  Gen: NAD, alert, cooperative with exam Msk: Chronic hyperpigmented changes to entire right foot, s/p right great toe amputation, unable to palpate DP or PT pulses, open wound present on lateral edge of foot with some surrounding edema, no drainage coming from the wound but yellow malodorous drainage present on bandage     Assessment/Plan: Diabetic Foot Ulcer: Seems to be improving with current antibiotic, although healing will likely take a very long time. - Will extend antibiotic duration for 7 more days (total of 14 days). Prescribed  Cipro bid x 7 additional days - Needs podiatry or vascular follow-up, but patient is self pay and states he cannot afford this - Work note provided to keep patient out of work until Monday due to pain - Patient needs close monitoring- appointment scheduled with PCP in 1 week. - Precepted with attending  T2DM: Uncontrolled. Most recent A1c was 12.7%. CBGs remain mildly elevated between 190-210, although this is an improvement from the 300s that were previously. - Increase Lantus from 50 units daily to 55 units daily - Continue Metformin 1000mg  bid - Continue checking blood sugars regularly - Should probably check BMP at next appointment (given that this hasn't been checked in almost a year) if patient is okay with the cost. - Follow-up with PCP in 1 week   Willadean CarolKaty Elysia Grand, MD PGY-3

## 2016-11-19 NOTE — Assessment & Plan Note (Addendum)
Uncontrolled. Most recent A1c was 12.7%. CBGs remain mildly elevated between 190-210, although this is an improvement from the 300s that were previously. - Increase Lantus from 50 units daily to 55 units daily - Continue Metformin 1000mg  bid - Continue checking blood sugars regularly - Should probably check BMP at next appointment (given that this hasn't been checked in almost a year) if patient is okay with the cost. - Follow-up with PCP in 1 week - Precepted with attending

## 2016-11-24 ENCOUNTER — Ambulatory Visit (INDEPENDENT_AMBULATORY_CARE_PROVIDER_SITE_OTHER): Payer: Self-pay | Admitting: Student

## 2016-11-24 ENCOUNTER — Encounter: Payer: Self-pay | Admitting: Student

## 2016-11-24 VITALS — BP 118/68 | HR 63 | Temp 98.1°F | Ht 64.0 in | Wt 151.2 lb

## 2016-11-24 DIAGNOSIS — L97418 Non-pressure chronic ulcer of right heel and midfoot with other specified severity: Secondary | ICD-10-CM

## 2016-11-24 DIAGNOSIS — E11621 Type 2 diabetes mellitus with foot ulcer: Secondary | ICD-10-CM
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
## 2016-11-24 NOTE — Patient Instructions (Addendum)
Diabetes y cuidados del pie  (Diabetes and Foot Care)  La diabetes puede ser la causa de que el flujo sanguneo (circulacin) en las piernas y los pies sea deficiente. Debido a esto, la piel de los pies se torna ms delgada, se rompe con facilidad y se cura ms lentamente. La piel puede estar seca, despellejarse y agrietarse. Tambin pueden estar daados los nervios de las piernas y de los pies lo que provoca una disminucin de la sensibilidad. Es posible que no advierta heridas ms pequeas en los pies, que pueden causar infecciones graves. Cuidar sus pies es una de las cosas ms importantes que puede hacer por usted mismo.  INSTRUCCIONES PARA EL CUIDADO EN EL HOGAR   Use siempre calzado, an dentro de su casa. No camine descalzo. Caminar descalzo facilita que se lastime.   Controle sus pies diariamente para observar ampollas, cortes y enrojecimiento. Si no puede ver la planta del pie, use un espejo o pdale ayuda a otra persona.   Lave sus pies con agua tibia (no use agua caliente) y un jabn suave. Seque bien sus pies, y la zona entre los dedos dando [place]ad[place], hasta que estn completamente secos. Noremoje los pies, ya que esto puede resecar la piel.   Aplique una locin hidratante o vaselina (que no contenga alcohol ni perfume) en los pies y en las uas secas y quebradizas. No aplique locin entre los dedos.   Recorte las uas en forma recta. No escarbe debajo de las uas o alrededor de las cutculas. Lime los bordes de las uas con una lima o esmeril.   No corte las durezas o callosidades, ni trate de quitarlas con medicamentos.   Use calcetines de algodn o medias limpias todos los das. Asegrese de que no le ajusten demasiado. Nouse calcetines que le lleguen a las rodillas, ya que podran disminuir el flujo de sangre a las piernas.   Use zapatos de cuero que le queden bien y que sean acolchados. Para amoldar los zapatos, clcelos slo algunas horas por da. Esto evitar lesiones en los pies.  Revise siempre los zapatos antes de ponerlos para asegurarse de que no haya objetos en su interior.   No cruce las piernas. Esto puede disminuir el flujo de sangre a los pies.   Si algo le ha raspado, cortado o lastimado la piel de los pies, mantenga la piel de esa zona limpia y seca. Debe higienizar estas zonas con agua y un jabn suave. No limpie la zona con agua oxigenada, alcohol ni yodo.   Cuando se quite un vendaje adhesivo, asegrese de no daar la piel.   Si tiene una herida, obsrvela varias veces por da para asegurarse de que se est curando.   No use bolsas de agua caliente ni almohadillas trmicas. Podran causar quemaduras. Si ha perdido la sensibilidad en los pies o las piernas, no sabr lo que le est sucediendo hasta que sea demasiado tarde.   Asegrese de que su mdico le haga un examen completo de los pies por lo menos una vez al ao, o con ms frecuencia si usted tiene problemas en los pies. Informe todos los cortes, llagas o moretones a su mdico inmediatamente.    SOLICITE ATENCIN MDICA SI:   Tiene una lesin que no se cura.   Tiene cortes o rajaduras en la piel.   Tiene una ua encarnada.   Nota una zona irritada en las piernas o los pies.   Siente una sensacin de ardor u hormigueo en las piernas   o los pies.   Siente dolor o calambres en las piernas o los pies.   Las piernas o los pies estn adormecidos.   Siente los pies siempre fros.    SOLICITE ATENCIN MDICA DE INMEDIATO SI:   Presenta enrojecimiento, hinchazn o aumento del dolor en una herida.   Nota una lnea roja que sube por pierna.   Aparece pus en la herida.   Le sube la fiebre o segn lo que le indique el mdico.   Advierte un olor ftido que proviene de una lcera o una herida.    Esta informacin no tiene como fin reemplazar el consejo del mdico. Asegrese de hacerle al mdico cualquier pregunta que tenga.  Document Released: 01/11/2005 Document Revised: 05/05/2015 Document Reviewed: 06/20/2012  Elsevier  Interactive Patient Education  2017 [employer] Inc.

## 2016-12-01 ENCOUNTER — Ambulatory Visit: Payer: Self-pay | Admitting: Student

## 2016-12-01 ENCOUNTER — Ambulatory Visit: Payer: Self-pay

## 2017-02-26 ENCOUNTER — Inpatient Hospital Stay (HOSPITAL_COMMUNITY)
Admission: EM | Admit: 2017-02-26 | Discharge: 2017-03-01 | DRG: 872 | Disposition: A | Discharge status: Home / Self Care | Payer: Self-pay | Attending: Family Medicine | Admitting: Family Medicine

## 2017-02-26 ENCOUNTER — Encounter (HOSPITAL_COMMUNITY): Payer: Self-pay | Admitting: *Deleted

## 2017-02-26 ENCOUNTER — Emergency Department (HOSPITAL_COMMUNITY): Payer: Self-pay

## 2017-02-26 ENCOUNTER — Inpatient Hospital Stay (HOSPITAL_COMMUNITY): Payer: Self-pay

## 2017-02-26 DIAGNOSIS — Z833 Family history of diabetes mellitus: Secondary | ICD-10-CM

## 2017-02-26 DIAGNOSIS — E1165 Type 2 diabetes mellitus with hyperglycemia: Secondary | ICD-10-CM | POA: Insufficient documentation

## 2017-02-26 DIAGNOSIS — F1021 Alcohol dependence, in remission: Secondary | ICD-10-CM | POA: Diagnosis present

## 2017-02-26 DIAGNOSIS — L03031 Cellulitis of right toe: Secondary | ICD-10-CM | POA: Diagnosis present

## 2017-02-26 DIAGNOSIS — F329 Major depressive disorder, single episode, unspecified: Secondary | ICD-10-CM | POA: Diagnosis present

## 2017-02-26 DIAGNOSIS — E871 Hypo-osmolality and hyponatremia: Secondary | ICD-10-CM | POA: Insufficient documentation

## 2017-02-26 DIAGNOSIS — Z23 Encounter for immunization: Secondary | ICD-10-CM

## 2017-02-26 DIAGNOSIS — A419 Sepsis, unspecified organism: Principal | ICD-10-CM | POA: Diagnosis present

## 2017-02-26 DIAGNOSIS — L97519 Non-pressure chronic ulcer of other part of right foot with unspecified severity: Secondary | ICD-10-CM | POA: Diagnosis present

## 2017-02-26 DIAGNOSIS — R509 Fever, unspecified: Secondary | ICD-10-CM

## 2017-02-26 DIAGNOSIS — D696 Thrombocytopenia, unspecified: Secondary | ICD-10-CM | POA: Diagnosis present

## 2017-02-26 DIAGNOSIS — Z794 Long term (current) use of insulin: Secondary | ICD-10-CM

## 2017-02-26 DIAGNOSIS — E11621 Type 2 diabetes mellitus with foot ulcer: Secondary | ICD-10-CM | POA: Diagnosis present

## 2017-02-26 DIAGNOSIS — E1140 Type 2 diabetes mellitus with diabetic neuropathy, unspecified: Secondary | ICD-10-CM | POA: Diagnosis present

## 2017-02-26 DIAGNOSIS — J069 Acute upper respiratory infection, unspecified: Secondary | ICD-10-CM | POA: Diagnosis present

## 2017-02-26 DIAGNOSIS — K219 Gastro-esophageal reflux disease without esophagitis: Secondary | ICD-10-CM | POA: Diagnosis present

## 2017-02-26 DIAGNOSIS — L02611 Cutaneous abscess of right foot: Secondary | ICD-10-CM | POA: Diagnosis present

## 2017-02-26 LAB — CBC WITH DIFFERENTIAL/PLATELET
BASOS ABS: 0 10*3/uL (ref 0.0–0.1)
Basophils Relative: 0 %
EOS ABS: 0 10*3/uL (ref 0.0–0.7)
EOS PCT: 0 %
HCT: 36.3 % — ABNORMAL LOW (ref 39.0–52.0)
Hemoglobin: 13.3 g/dL (ref 13.0–17.0)
Lymphocytes Relative: 15 %
Lymphs Abs: 1.4 10*3/uL (ref 0.7–4.0)
MCH: 31.2 pg (ref 26.0–34.0)
MCHC: 36.6 g/dL — ABNORMAL HIGH (ref 30.0–36.0)
MCV: 85.2 fL (ref 78.0–100.0)
Monocytes Absolute: 0.8 10*3/uL (ref 0.1–1.0)
Monocytes Relative: 9 %
Neutro Abs: 7.4 10*3/uL (ref 1.7–7.7)
Neutrophils Relative %: 76 %
PLATELETS: 86 10*3/uL — AB (ref 150–400)
RBC: 4.26 MIL/uL (ref 4.22–5.81)
RDW: 12.7 % (ref 11.5–15.5)
WBC: 9.7 10*3/uL (ref 4.0–10.5)

## 2017-02-26 LAB — COMPREHENSIVE METABOLIC PANEL
ALT: 39 U/L (ref 17–63)
AST: 40 U/L (ref 15–41)
Albumin: 3.2 g/dL — ABNORMAL LOW (ref 3.5–5.0)
Alkaline Phosphatase: 151 U/L — ABNORMAL HIGH (ref 38–126)
Anion gap: 12 (ref 5–15)
BUN: 10 mg/dL (ref 6–20)
CHLORIDE: 94 mmol/L — AB (ref 101–111)
CO2: 19 mmol/L — AB (ref 22–32)
CREATININE: 0.67 mg/dL (ref 0.61–1.24)
Calcium: 8.4 mg/dL — ABNORMAL LOW (ref 8.9–10.3)
GFR calc non Af Amer: >60 mL/min (ref >60)
Glucose, Bld: 334 mg/dL — ABNORMAL HIGH (ref 65–99)
POTASSIUM: 3.8 mmol/L (ref 3.5–5.1)
SODIUM: 125 mmol/L — AB (ref 135–145)
Total Bilirubin: 1.4 mg/dL — ABNORMAL HIGH (ref 0.3–1.2)
Total Protein: 7 g/dL (ref 6.5–8.1)

## 2017-02-26 LAB — APTT: aPTT: 32 seconds (ref 24–36)

## 2017-02-26 LAB — GLUCOSE, CAPILLARY
Glucose-Capillary: 193 mg/dL — ABNORMAL HIGH (ref 65–99)
Glucose-Capillary: 238 mg/dL — ABNORMAL HIGH (ref 65–99)

## 2017-02-26 LAB — PROTIME-INR
INR: 1.25
PROTHROMBIN TIME: 15.6 s — AB (ref 11.4–15.2)

## 2017-02-26 LAB — I-STAT CG4 LACTIC ACID, ED: LACTIC ACID, VENOUS: 1.46 mmol/L (ref 0.5–1.9)

## 2017-02-26 LAB — CBG MONITORING, ED: GLUCOSE-CAPILLARY: 322 mg/dL — AB (ref 65–99)

## 2017-02-26 LAB — PROCALCITONIN: Procalcitonin: 0.18 ng/mL

## 2017-02-26 MED ORDER — VANCOMYCIN HCL 10 G IV SOLR
1250.0000 mg | Freq: Once | INTRAVENOUS | Status: AC
  Administered 2017-02-26: 1250 mg via INTRAVENOUS
  Filled 2017-02-26: qty 1250

## 2017-02-26 MED ORDER — PIPERACILLIN-TAZOBACTAM 3.375 G IVPB
3.3750 g | Freq: Three times a day (TID) | INTRAVENOUS | Status: DC
Start: 2017-02-26 — End: 2017-03-01
  Administered 2017-02-26 – 2017-03-01 (×8): 3.375 g via INTRAVENOUS
  Filled 2017-02-26 (×9): qty 50

## 2017-02-26 MED ORDER — ACETAMINOPHEN 650 MG RE SUPP: 650.0000 mg | Freq: Four times a day (QID) | RECTAL | Status: DC | PRN

## 2017-02-26 MED ORDER — INSULIN ASPART 100 UNIT/ML ~~LOC~~ SOLN
0.0000 [IU] | Freq: Every day | SUBCUTANEOUS | Status: DC
  Administered 2017-02-26 – 2017-02-27 (×2): 2 [IU] via SUBCUTANEOUS
  Administered 2017-02-28: 3 [IU] via SUBCUTANEOUS

## 2017-02-26 MED ORDER — PIPERACILLIN-TAZOBACTAM 3.375 G IVPB 30 MIN
3.3750 g | Freq: Once | INTRAVENOUS | Status: AC
  Administered 2017-02-26: 3.375 g via INTRAVENOUS
  Filled 2017-02-26: qty 50

## 2017-02-26 MED ORDER — ACETAMINOPHEN 325 MG PO TABS: 650.0000 mg | ORAL_TABLET | Freq: Four times a day (QID) | ORAL | Status: DC | PRN

## 2017-02-26 MED ORDER — MORPHINE SULFATE (PF) 4 MG/ML IV SOLN
4.0000 mg | Freq: Once | INTRAVENOUS | Status: AC
  Administered 2017-02-26: 4 mg via INTRAVENOUS
  Filled 2017-02-26: qty 1

## 2017-02-26 MED ORDER — SALINE SPRAY 0.65 % NA SOLN
1.0000 | NASAL | Status: DC | PRN
  Administered 2017-02-27: 1 via NASAL
  Filled 2017-02-26: qty 44

## 2017-02-26 MED ORDER — PHENOL 1.4 % MT LIQD: 1.0000 | OROMUCOSAL | Status: DC | PRN

## 2017-02-26 MED ORDER — SODIUM CHLORIDE 0.9 % IV BOLUS (SEPSIS)
2000.0000 mL | Freq: Once | INTRAVENOUS | Status: AC
  Administered 2017-02-26: 2000 mL via INTRAVENOUS

## 2017-02-26 MED ORDER — VANCOMYCIN HCL IN DEXTROSE 750-5 MG/150ML-% IV SOLN
750.0000 mg | Freq: Three times a day (TID) | INTRAVENOUS | Status: DC
  Administered 2017-02-26 – 2017-03-01 (×7): 750 mg via INTRAVENOUS
  Filled 2017-02-26 (×9): qty 150

## 2017-02-26 MED ORDER — INSULIN GLARGINE 100 UNIT/ML ~~LOC~~ SOLN
30.0000 [IU] | Freq: Every day | SUBCUTANEOUS | Status: DC
  Administered 2017-02-26 – 2017-02-27 (×2): 30 [IU] via SUBCUTANEOUS
  Filled 2017-02-26 (×2): qty 0.3

## 2017-02-26 MED ORDER — POLYETHYLENE GLYCOL 3350 17 G PO PACK: 17.0000 g | PACK | Freq: Every day | ORAL | Status: DC | PRN

## 2017-02-26 MED ORDER — DULOXETINE HCL 60 MG PO CPEP
60.0000 mg | ORAL_CAPSULE | Freq: Every day | ORAL | Status: DC
  Administered 2017-02-27 – 2017-03-01 (×3): 60 mg via ORAL
  Filled 2017-02-26 (×3): qty 1

## 2017-02-26 MED ORDER — GABAPENTIN 100 MG PO CAPS
100.0000 mg | ORAL_CAPSULE | Freq: Three times a day (TID) | ORAL | Status: DC
  Administered 2017-02-26 – 2017-03-01 (×8): 100 mg via ORAL
  Filled 2017-02-26 (×8): qty 1

## 2017-02-26 MED ORDER — ONDANSETRON HCL 4 MG PO TABS: 4.0000 mg | ORAL_TABLET | Freq: Four times a day (QID) | ORAL | Status: DC | PRN

## 2017-02-26 MED ORDER — MORPHINE SULFATE (PF) 4 MG/ML IV SOLN
2.0000 mg | INTRAVENOUS | Status: DC | PRN
  Administered 2017-02-26 (×4): 2 mg via INTRAVENOUS
  Filled 2017-02-26 (×4): qty 1

## 2017-02-26 MED ORDER — GADOBENATE DIMEGLUMINE 529 MG/ML IV SOLN
15.0000 mL | Freq: Once | INTRAVENOUS | Status: AC
  Administered 2017-02-26: 15 mL via INTRAVENOUS

## 2017-02-26 MED ORDER — ACETAMINOPHEN 325 MG PO TABS
650.0000 mg | ORAL_TABLET | Freq: Four times a day (QID) | ORAL | Status: DC | PRN
  Administered 2017-02-26 – 2017-03-01 (×4): 650 mg via ORAL
  Filled 2017-02-26 (×5): qty 2

## 2017-02-26 MED ORDER — ONDANSETRON HCL 4 MG/2ML IJ SOLN
4.0000 mg | Freq: Four times a day (QID) | INTRAMUSCULAR | Status: DC | PRN
Start: 2017-02-26 — End: 2017-03-01

## 2017-02-26 MED ORDER — SODIUM CHLORIDE 0.9 % IV SOLN
INTRAVENOUS | Status: DC
  Administered 2017-02-26: 21:00:00 via INTRAVENOUS

## 2017-02-26 MED ORDER — ADULT MULTIVITAMIN W/MINERALS CH
1.0000 | ORAL_TABLET | Freq: Every day | ORAL | Status: DC
  Administered 2017-02-27 – 2017-03-01 (×3): 1 via ORAL
  Filled 2017-02-26 (×3): qty 1

## 2017-02-26 MED ORDER — INSULIN ASPART 100 UNIT/ML ~~LOC~~ SOLN
0.0000 [IU] | Freq: Three times a day (TID) | SUBCUTANEOUS | Status: DC
  Administered 2017-02-26: 3 [IU] via SUBCUTANEOUS
  Administered 2017-02-27: 11 [IU] via SUBCUTANEOUS
  Administered 2017-02-27 – 2017-02-28 (×3): 3 [IU] via SUBCUTANEOUS
  Administered 2017-02-28 (×2): 5 [IU] via SUBCUTANEOUS
  Administered 2017-03-01: 3 [IU] via SUBCUTANEOUS
  Administered 2017-03-01: 8 [IU] via SUBCUTANEOUS

## 2017-02-26 NOTE — ED Notes (Signed)
Got patient hooked up to the monitor patient is resting with call bell in reach 

## 2017-02-26 NOTE — ED Triage Notes (Signed)
To ED for eval of right foot pain for past 3 days. Pt with open area to outter right foot. Hx of surgery to same foot- last being 2 years ago

## 2017-02-26 NOTE — Progress Notes (Signed)
MRI scan reviewed of the right foot. Nothing surgical tonight. Agree with antibiotics for sepsis. Full consult to follow in a.m.
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
## 2017-02-26 NOTE — ED Notes (Signed)
Patient transported to X-ray 

## 2017-02-26 NOTE — Progress Notes (Signed)
Pharmacy Antibiotic Note  Mitchell [name] is a 42 y.o. male admitted on 02/26/2017 with sepsis.  Pharmacy has been consulted for vancomycin and Zosyn dosing.  Has foot infection. Give one time doses in the ED.  Plan: Start Zosyn 3.375 gm IV q8h (4 hour infusion) Start vancomycin 750mg  IV Q8h Monitor clinical picture, renal function, VT prn F/U C&S, abx deescalation / LOT     Temp (24hrs), Avg:101.2 F (38.4 C), Min:99.9 F (37.7 C), Max:102.4 F (39.1 C)  Recent Labs  Lab 02/26/17 0958 02/26/17 1021  WBC 9.7  --   CREATININE 0.67  --   LATICACIDVEN  --  1.46    CrCl cannot be calculated (Unknown ideal weight.).    No Known Allergies   Thank you for allowing pharmacy to be a part of this patient's care.  Armandina StammerBATCHELDER,Konstance Happel J 02/26/2017 1:52 PM
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
## 2017-02-26 NOTE — ED Notes (Signed)
Admitting PA at bedside.

## 2017-02-27 DIAGNOSIS — L97519 Non-pressure chronic ulcer of other part of right foot with unspecified severity: Secondary | ICD-10-CM

## 2017-02-27 DIAGNOSIS — F329 Major depressive disorder, single episode, unspecified: Secondary | ICD-10-CM

## 2017-02-27 DIAGNOSIS — E11621 Type 2 diabetes mellitus with foot ulcer: Secondary | ICD-10-CM

## 2017-02-27 LAB — CBC
HEMATOCRIT: 33.9 % — AB (ref 39.0–52.0)
HEMOGLOBIN: 11.9 g/dL — AB (ref 13.0–17.0)
MCH: 30.9 pg (ref 26.0–34.0)
MCHC: 35.1 g/dL (ref 30.0–36.0)
MCV: 88.1 fL (ref 78.0–100.0)
Platelets: 72 10*3/uL — ABNORMAL LOW (ref 150–400)
RBC: 3.85 MIL/uL — AB (ref 4.22–5.81)
RDW: 13.2 % (ref 11.5–15.5)
WBC: 5.5 10*3/uL (ref 4.0–10.5)

## 2017-02-27 LAB — BASIC METABOLIC PANEL
ANION GAP: 9 (ref 5–15)
BUN: 5 mg/dL — ABNORMAL LOW (ref 6–20)
CALCIUM: 7.6 mg/dL — AB (ref 8.9–10.3)
CHLORIDE: 103 mmol/L (ref 101–111)
CO2: 23 mmol/L (ref 22–32)
Creatinine, Ser: 0.66 mg/dL (ref 0.61–1.24)
GFR calc non Af Amer: >60 mL/min (ref >60)
GLUCOSE: 223 mg/dL — AB (ref 65–99)
POTASSIUM: 3.3 mmol/L — AB (ref 3.5–5.1)
Sodium: 135 mmol/L (ref 135–145)

## 2017-02-27 LAB — GLUCOSE, CAPILLARY
GLUCOSE-CAPILLARY: 323 mg/dL — AB (ref 65–99)
GLUCOSE-CAPILLARY: 226 mg/dL — AB (ref 65–99)
Glucose-Capillary: 229 mg/dL — ABNORMAL HIGH (ref 65–99)
Glucose-Capillary: 155 mg/dL — ABNORMAL HIGH (ref 65–99)

## 2017-02-27 LAB — RAPID URINE DRUG SCREEN, HOSP PERFORMED
Amphetamines: NOT DETECTED
Barbiturates: NOT DETECTED
Benzodiazepines: NOT DETECTED
COCAINE: POSITIVE — AB
OPIATES: POSITIVE — AB
TETRAHYDROCANNABINOL: NOT DETECTED

## 2017-02-27 LAB — HEMOGLOBIN A1C
Hgb A1c MFr Bld: 11.9 % — ABNORMAL HIGH (ref 4.8–5.6)
Mean Plasma Glucose: 294.83 mg/dL

## 2017-02-27 LAB — HIV ANTIBODY (ROUTINE TESTING W REFLEX): HIV SCREEN 4TH GENERATION: NONREACTIVE

## 2017-02-27 MED ORDER — PNEUMOCOCCAL VAC POLYVALENT 25 MCG/0.5ML IJ INJ
0.5000 mL | INJECTION | INTRAMUSCULAR | Status: AC
Start: 2017-02-28 — End: 2017-03-01
  Administered 2017-03-01: 0.5 mL via INTRAMUSCULAR
  Filled 2017-02-27: qty 0.5

## 2017-02-27 MED ORDER — INFLUENZA VAC SPLIT QUAD 0.5 ML IM SUSY
0.5000 mL | PREFILLED_SYRINGE | INTRAMUSCULAR | Status: AC
  Administered 2017-03-01: 0.5 mL via INTRAMUSCULAR
  Filled 2017-02-27: qty 0.5

## 2017-02-27 MED ORDER — INSULIN GLARGINE 100 UNIT/ML ~~LOC~~ SOLN
40.0000 [IU] | Freq: Every day | SUBCUTANEOUS | Status: DC
  Administered 2017-02-28: 40 [IU] via SUBCUTANEOUS
  Filled 2017-02-27 (×2): qty 0.4

## 2017-02-27 MED ORDER — MORPHINE SULFATE (PF) 4 MG/ML IV SOLN
4.0000 mg | INTRAVENOUS | Status: DC | PRN
  Administered 2017-02-27 – 2017-02-28 (×6): 4 mg via INTRAVENOUS
  Filled 2017-02-27 (×6): qty 1

## 2017-02-27 NOTE — Progress Notes (Signed)
Family Medicine Teaching Service Daily Progress Note Intern Pager: [phone]  Patient name: Mitchell [name] Medical record number: [id number] Date of birth: 11/07/1975 Age: 42 y.o. Gender: male  Primary Care Provider: Almon HerculesGonfa, Taye T, MD Consultants: Ortho Code Status: Full  Pt Overview and Major Events to Date:  02/26/17: Admitted to FMTS with sepsis 2/2 infected diabetic foot ulcer of right foot.  Assessment and Plan: Mitchell [name] is a 42 y.o. male presenting with sepsis 2/2 infected diabetic ulcer of right foot. PMH is significant for uncontrolled T2DM, depression, chronic thrombocytopenia, hx alcohol abuse.  Sepsis 2/2 infected diabetic ulcer of right foot: Has had multiple diabetic foot infections of this right foot since 2013. 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 Dr. Lajoyce Cornersuda also recommended transtibial amputation in May 2017, but patient declined. Right foot MRI this admission without signs of osteomyelitis or a fluid collection. Sepsis picture now resolving. Patient afebrile with normal HR this morning. Feels like his pain, redness, and swelling have gotten much better this morning. - Continue Vancomycin and Zosyn (2/2- ) - [facility] consulted and will see today. Patient has been NPO. - Blood cultures pending - Continue MIVFs - Tylenol 650mg  q6hrs prn and Morphine 4mg  q2hrs prn for severe pain - Zofran prn for nausea - Vitals per unit routine  T2DM: Uncontrolled. Most recent A1c 11/08/16 was 12.7. Taking Lantus 55 units daily and Metformin 1000mg  bid at home. CBGs have been 193-322 since admission. - Lantus 30 units qhs. Will likely increase tonight. - Continue moderate SSI - Recheck A1c - Hold Metformin while hospitalized - Continue Gabapentin 100mg  tid for diabetic neuropathy - Taking Lisinopril 2.5mg  presumably for renal protection- will hold due to sepsis  URI: Having rhinorrhea, congestion, sore throat, and cough. Lungs clear. CXR negative. PCT  presumably done as a part of the sepsis order set and was 0.18. - Treat symptomatically with nasal saline, chloraseptic spray, and tylenol prn - Will not obtain RVP as it will not change management  Hypovolemic hyponatremia: Na 125 on admission. Corrects to 131 when hyperglycemia taken into account. Appears mildly dry on exam. - s/p 2L NS bolus in the ED. Continue MIVFs.  - BMP pending - If no improvement, can consider obtaining serum osmolality, urinary osmolality, and urinary sodium to evaluate further.  Hx alcohol dependence: Last drink was 5 years ago. - Monitor - No need for CIWA at this time  Cocaine Use: First used cocaine for the first time yesterday because "his pain was unbearable". States he will never use it again. UDS positive for cocaine on admission. - Monitor  Depression: Stable. At his baseline of 7886.  - Continue home Cymbalta  Thrombocytopenia: Chronic. Has a hx of alcohol abuse, but has not drank in 5 years and AST/ALT are normal. ITP unlikely due to chronicity. May have a congential platelet disorder. He also does have a history of borderline low WBC, so this may be a bone marrow issue. - Hold anticoagulation due to thrombocytopenia - Monitor with daily CBC - Could consider outpatient work-up, although this would be difficult due to lack of insurance.  FEN/GI: NPO for now until seen by ortho Prophylaxis: No pharmacologic DVT prophylaxis due to thrombocytopenia, SCDs only to left leg due to severe right leg and foot pain.  Disposition: Home vs SNF/CIR if he has an amputation this admission. Anticipate discharge in the next 2-3 days.  Subjective:  Doing better this morning. He feels like his pain, redness, swelling, and warmth  of the right foot have all improved.   Objective: Temp:  [98 F (36.7 C)-102.4 F (39.1 C)] 98 F (36.7 C) (02/02 2211) Pulse Rate:  [63-101] 65 (02/02 2211) Resp:  [16-20] 16 (02/02 2211) BP: (96-135)/(44-88) 105/60 (02/02  2211) SpO2:  [97 %-100 %] 100 % (02/02 2211) Physical Exam: General: Well-appearing, sitting up in bed, in NAD Cardiovascular: RRR, no murmurs Respiratory: CTAB, normal work of breathing Abdomen: +BS, soft, non-tender, non-distended Extremities: R foot with improved swelling and warmth, still very tender to palpation, no drainage from ulcer.  Laboratory: Recent Labs  Lab 02/26/17 0958  WBC 9.7  HGB 13.3  HCT 36.3*  PLT 86*   Recent Labs  Lab 02/26/17 0958  NA 125*  K 3.8  CL 94*  CO2 19*  BUN 10  CREATININE 0.67  CALCIUM 8.4*  PROT 7.0  BILITOT 1.4*  ALKPHOS 151*  ALT 39  AST 40  GLUCOSE 334*   Lactic acid 1.46 Procalcitonin 0.18 PT 15.6 INR 1.25  Imaging/Diagnostic Tests: Right foot x-ray: soft tissue defect, no underlying bony erosion to suggest osteomyelitis Right tib/fib x-ray: negative CXR: negative MRI right foot: Severe cellulitis. No osteomyelitis or fluid collection.    Sammye Staff, Allyn Kenner, MD 02/27/2017, 6:35 AM PGY-3, [facility] FPTS Intern pager: [phone], text pages welcome

## 2017-02-27 NOTE — Plan of Care (Signed)
  Progressing Education: Knowledge of General Education information will improve 02/27/2017 1608 - Progressing by Darreld Mcleanox, Donnalynn Wheeless, RN Health Behavior/Discharge Planning: Ability to manage health-related needs will improve 02/27/2017 1608 - Progressing by Darreld Mcleanox, Tayleigh Wetherell, RN Clinical Measurements: Ability to maintain clinical measurements within normal limits will improve 02/27/2017 1608 - Progressing by Darreld Mcleanox, Lind Ausley, RN Will remain free from infection 02/27/2017 1608 - Progressing by Darreld Mcleanox, Lois Ostrom, RN Diagnostic test results will improve 02/27/2017 1608 - Progressing by Darreld Mcleanox, Xan Ingraham, RN Respiratory complications will improve 02/27/2017 1608 - Progressing by Darreld Mcleanox, Jebidiah Baggerly, RN Cardiovascular complication will be avoided 02/27/2017 1608 - Progressing by Darreld Mcleanox, Ronia Hazelett, RN Activity: Risk for activity intolerance will decrease 02/27/2017 1608 - Progressing by Darreld Mcleanox, Bradlee Heitman, RN Nutrition: Adequate nutrition will be maintained 02/27/2017 1608 - Progressing by Darreld Mcleanox, Dash Cardarelli, RN Coping: Level of anxiety will decrease 02/27/2017 1608 - Progressing by Darreld Mcleanox, Alizaya Oshea, RN Elimination: Will not experience complications related to bowel motility 02/27/2017 1608 - Progressing by Darreld Mcleanox, Zarriah Starkel, RN Will not experience complications related to urinary retention 02/27/2017 1608 - Progressing by Darreld Mcleanox, Argus Caraher, RN Pain Managment: General experience of comfort will improve 02/27/2017 1608 - Progressing by Darreld Mcleanox, Mead Slane, RN Safety: Ability to remain free from injury will improve 02/27/2017 1608 - Progressing by Darreld Mcleanox, Virgene Tirone, RN Skin Integrity: Risk for impaired skin integrity will decrease 02/27/2017 1608 - Progressing by Darreld Mcleanox, Brissa Asante, RN

## 2017-02-27 NOTE — Progress Notes (Signed)
Right foot cellulitis present Multiple prior surgeries on the right foot noted Small dime size ulceration over the lateral aspect base of the fifth metatarsal No fluctuance or drainage anywhere in the foot Pedal pulses and sensation diminished  Plan at this time is to see if the IV antibiotics will work. MRI scan shows no drainable fluid collection only cellulitis No osteomyelitis is present Essentially if this clears up with IV antibiotics then he will not need any intervention, if it does not clear up then he will need transtibial amputation with Dr. Lajoyce Cornersuda next week

## 2017-02-28 ENCOUNTER — Other Ambulatory Visit: Payer: Self-pay

## 2017-02-28 ENCOUNTER — Encounter (HOSPITAL_COMMUNITY): Payer: Self-pay | Admitting: Orthopedic Surgery

## 2017-02-28 DIAGNOSIS — Z794 Long term (current) use of insulin: Secondary | ICD-10-CM

## 2017-02-28 DIAGNOSIS — L97509 Non-pressure chronic ulcer of other part of unspecified foot with unspecified severity: Secondary | ICD-10-CM

## 2017-02-28 LAB — BASIC METABOLIC PANEL
Anion gap: 10 (ref 5–15)
BUN: 5 mg/dL — AB (ref 6–20)
CHLORIDE: 102 mmol/L (ref 101–111)
CO2: 25 mmol/L (ref 22–32)
CREATININE: 0.66 mg/dL (ref 0.61–1.24)
Calcium: 7.7 mg/dL — ABNORMAL LOW (ref 8.9–10.3)
GFR calc non Af Amer: >60 mL/min (ref >60)
GLUCOSE: 149 mg/dL — AB (ref 65–99)
Potassium: 3 mmol/L — ABNORMAL LOW (ref 3.5–5.1)
Sodium: 137 mmol/L (ref 135–145)

## 2017-02-28 LAB — CBC
HEMATOCRIT: 33.7 % — AB (ref 39.0–52.0)
Hemoglobin: 12.2 g/dL — ABNORMAL LOW (ref 13.0–17.0)
MCH: 31.7 pg (ref 26.0–34.0)
MCHC: 36.2 g/dL — ABNORMAL HIGH (ref 30.0–36.0)
MCV: 87.5 fL (ref 78.0–100.0)
Platelets: 81 10*3/uL — ABNORMAL LOW (ref 150–400)
RBC: 3.85 MIL/uL — ABNORMAL LOW (ref 4.22–5.81)
RDW: 13 % (ref 11.5–15.5)
WBC: 3.4 10*3/uL — ABNORMAL LOW (ref 4.0–10.5)

## 2017-02-28 LAB — GLUCOSE, CAPILLARY
GLUCOSE-CAPILLARY: 170 mg/dL — AB (ref 65–99)
Glucose-Capillary: 228 mg/dL — ABNORMAL HIGH (ref 65–99)
Glucose-Capillary: 224 mg/dL — ABNORMAL HIGH (ref 65–99)
Glucose-Capillary: 299 mg/dL — ABNORMAL HIGH (ref 65–99)

## 2017-02-28 MED ORDER — POTASSIUM CHLORIDE CRYS ER 20 MEQ PO TBCR
40.0000 meq | EXTENDED_RELEASE_TABLET | Freq: Once | ORAL | Status: AC
  Administered 2017-02-28: 40 meq via ORAL
  Filled 2017-02-28: qty 2

## 2017-02-28 MED ORDER — OXYCODONE HCL 5 MG PO TABS
5.0000 mg | ORAL_TABLET | ORAL | Status: DC | PRN
  Administered 2017-02-28 – 2017-03-01 (×4): 5 mg via ORAL
  Filled 2017-02-28 (×4): qty 1

## 2017-02-28 MED ORDER — CALCIUM CARBONATE ANTACID 500 MG PO CHEW: 1.0000 | CHEWABLE_TABLET | Freq: Two times a day (BID) | ORAL | Status: DC | PRN

## 2017-02-28 NOTE — Discharge Summary (Signed)
[facility] Discharge Summary  Patient name: Mitchell [name] Medical record number: [id number] Date of birth: 1975/04/07 Age: 42 y.o. Gender: male Date of Admission: 02/26/2017  Date of Discharge: 03/01/2017 Admitting Physician: Lind Covert, MD  Primary Care Provider: Mercy Riding, MD Consultants: None  Indication for Hospitalization: sepsis d/t diabetic foot ulcer  Discharge Diagnoses/Problem List:  Sepsis 2/2 diabetic R foot ulcer, resolved Type 2 diabetes, stable Hyponatremia, resolved Depression, stable Thrombocytopenia, stable  Disposition: home  Discharge Condition: improved  Discharge Exam:  General: Well-appearing, lying in bed, in NAD Cardiovascular: RRR, no murmurs Respiratory: CTAB, normal work of breathing Abdomen: +BS, NTND  Extremities: R foot with minimal erythema, no warmth or swelling. Dressing applied over wound, clean and dry.  Brief Hospital Course:  Mitchell [name] a 42 y.o.malewith PMH significant for uncontrolled T2DM,depression, chronic thrombocytopenia who presented with sepsis 2/2 infected diabetic ulcer of right foot. In the ED, patient met sepsis criteria with fever 102.4, tachycardia 97, WBC 9.7 (patient's baseline 3-4). Patient received 2L NS bolus, blood cultures drawn, and started on broad spectrum antibiotics. Foot xray and MRI did not show signs of osteomyelitis. Once blood cultures resulted no growth at 48 hours, patient switched to PO antibiotics with Cipro and Bactrim. Has previously been treated with Clindamycin, cipro in the outpatient setting for same foot ulcer. Also seen by Orthopedics in May 2017 with recommendation for transtibial amputation which the patient declined. Orthopedics consulted this admission who decided against amputation per patient's request and will follow him 1 week after discharge. Patient's wound improved throughout admission with sepsis picture resolved. Will be discharged with  continued oral antibiotics. Patient's blood sugars were also elevated on admission to 300s. A1c this admission 11.9. Patient managed on Lantus but demonstrated need for meal coverage insulin so started at discharge.  Issues for Follow Up:  1. Medication Changes: 1. Discharged on home Lantus 55u 2. Started Novolog 10u BID with 2 largest meals. Please f/u compliance and understanding of regimen. 3. Will continue Cipro and Bactrim until 2/16 for 14 day total course 2. Patient will follow up with Orthopedics 1 week after discharge 3. Patient is without insurance, so caution is advised with more expensive medications. Could benefit from pharmacy consult outpatient.  Significant Procedures: None  Significant Labs and Imaging:  Recent Labs  Lab 02/27/17 0640 02/28/17 0905 03/01/17 0547  WBC 5.5 3.4* 2.7*  HGB 11.9* 12.2* 12.3*  HCT 33.9* 33.7* 35.8*  PLT 72* 81* 92*   Recent Labs  Lab 02/26/17 0958 02/27/17 0640 02/28/17 0905 03/01/17 0547  NA 125* 135 137 134*  K 3.8 3.3* 3.0* 3.8  CL 94* 103 102 96*  CO2 19* '23 25 28  ' GLUCOSE 334* 223* 149* 294*  BUN 10 5* 5* <5*  CREATININE 0.67 0.66 0.66 0.60*  CALCIUM 8.4* 7.6* 7.7* 8.4*  ALKPHOS 151*  --   --   --   AST 40  --   --   --   ALT 39  --   --   --   ALBUMIN 3.2*  --   --   --    Right foot x-ray: soft tissue defect, no underlying bony erosion to suggest osteomyelitis Right tib/fib x-ray: negative CXR: negative MRI right foot: Severe cellulitis. No osteomyelitis or fluid collection.  Results/Tests Pending at Time of Discharge: None  Discharge Medications:  Allergies as of 03/01/2017   No Known Allergies     Medication List    STOP taking  these medications   feeding supplement (PRO-STAT SUGAR FREE 64) Liqd   traMADol 50 MG tablet Commonly known as:  ULTRAM     TAKE these medications   ciprofloxacin 500 MG tablet Commonly known as:  CIPRO Take 1 tablet (500 mg total) by mouth 2 (two) times daily for 11 days.    DULoxetine 60 MG capsule Commonly known as:  CYMBALTA Take 1 capsule (60 mg total) by mouth daily.   feeding supplement (GLUCERNA SHAKE) Liqd Take 237 mLs by mouth 2 (two) times daily between meals.   gabapentin 100 MG capsule Commonly known as:  NEURONTIN Take 1 capsule (100 mg total) by mouth 3 (three) times daily.   insulin aspart 100 UNIT/ML injection Commonly known as:  NOVOLOG Inject 10 Units into the skin 2 (two) times daily with a meal.   insulin glargine 100 UNIT/ML injection Commonly known as:  LANTUS Inject 0.55 mLs (55 Units total) into the skin at bedtime.   lisinopril 5 MG tablet Commonly known as:  PRINIVIL,ZESTRIL Take 0.5 tablets (2.5 mg total) by mouth daily.   metFORMIN 1000 MG tablet Commonly known as:  GLUCOPHAGE Take 1 tablet (1,000 mg total) by mouth 2 (two) times daily with a meal.   multivitamin with minerals Tabs tablet Take 1 tablet by mouth daily. For for low vitamin   nitroGLYCERIN 0.4 MG SL tablet Commonly known as:  NITROSTAT Place 1 tablet (0.4 mg total) under the tongue every 5 (five) minutes as needed for chest pain.   sulfamethoxazole-trimethoprim 800-160 MG tablet Commonly known as:  BACTRIM DS,SEPTRA DS Take 1 tablet by mouth every 12 (twelve) hours for 11 days.       Discharge Instructions: Please refer to Patient Instructions section of EMR for full details.  Patient was counseled important signs and symptoms that should prompt return to medical care, changes in medications, dietary instructions, activity restrictions, and follow up appointments.   Follow-Up Appointments: Follow-up Information    Newt Minion, MD Follow up in 1 week(s).   Specialty:  Orthopedic Surgery Contact information: [place] [address] [phone]        Mercy Riding, MD Follow up on 03/07/2017.   Specialty:  Family Medicine Why:  '@1' :20pm Contact information: [place] [address] [phone]            Rory Percy, DO 03/01/2017, 10:54 PM PGY-1, [facility]

## 2017-02-28 NOTE — Plan of Care (Signed)
  Progressing Health Behavior/Discharge Planning: Ability to manage health-related needs will improve 02/28/2017 1254 - Progressing by Darreld Mcleanox, Maryanne Huneycutt, RN Clinical Measurements: Ability to maintain clinical measurements within normal limits will improve 02/28/2017 1254 - Progressing by Darreld Mcleanox, [place] Decoteau, RN Will remain free from infection 02/28/2017 1254 - Progressing by Darreld Mcleanox, Emalynn Clewis, RN Diagnostic test results will improve 02/28/2017 1254 - Progressing by Darreld Mcleanox, Aurilla Coulibaly, RN Respiratory complications will improve 02/28/2017 1254 - Progressing by Darreld Mcleanox, Stephanie Littman, RN Cardiovascular complication will be avoided 02/28/2017 1254 - Progressing by Darreld Mcleanox, Rojelio Uhrich, RN Activity: Risk for activity intolerance will decrease 02/28/2017 1254 - Progressing by Darreld Mcleanox, Tyrika Newman, RN Nutrition: Adequate nutrition will be maintained 02/28/2017 1254 - Progressing by Darreld Mcleanox, Kayle Correa, RN Coping: Level of anxiety will decrease 02/28/2017 1254 - Progressing by Darreld Mcleanox, Quest Tavenner, RN Elimination: Will not experience complications related to bowel motility 02/28/2017 1254 - Progressing by Darreld Mcleanox, Addalie Calles, RN Will not experience complications related to urinary retention 02/28/2017 1254 - Progressing by Darreld Mcleanox, Mckenlee Mangham, RN Pain Managment: General experience of comfort will improve 02/28/2017 1254 - Progressing by Darreld Mcleanox, Dauntae Derusha, RN Safety: Ability to remain free from injury will improve 02/28/2017 1254 - Progressing by Darreld Mcleanox, Yola Paradiso, RN Skin Integrity: Risk for impaired skin integrity will decrease 02/28/2017 1254 - Progressing by Darreld Mcleanox, Amardeep Beckers, RN

## 2017-02-28 NOTE — Progress Notes (Signed)
Right foot stable No fluctuance Mild erythema present lateral midfoot Continue antibiotics He should not leave the hospital until Dr. Lajoyce Cornersuda sees him tomorrow

## 2017-02-28 NOTE — Progress Notes (Signed)
Family Medicine Teaching Service Daily Progress Note Intern Pager: [phone]  Patient name: Mitchell [name] Medical record number: [id number] Date of birth: [birth year] Age: 42 y.o. Gender: male  Primary Care Provider: Almon Hercules, MD Consultants: Ortho Code Status: Full  Pt Overview and Major Events to Date:  02/26/17: Admitted to FMTS with sepsis 2/2 infected diabetic foot ulcer of right foot.  Assessment and Plan: Mitchell [name] is a 42 y.o. male presenting with sepsis 2/2 infected diabetic ulcer of right foot. PMH is significant for uncontrolled T2DM, depression, chronic thrombocytopenia, hx alcohol abuse.  Sepsis 2/2 infected diabetic ulcer of right foot: Has had multiple diabetic foot infections of this right foot since 2013. He is s/p 1st digit amputation by Dr. Lajoyce Corners in 2013. Dr. Lajoyce Corners also recommended transtibial amputation in May 2017, but patient declined. Right foot MRI this admission without signs of osteomyelitis or a fluid collection. Ortho consulted this admission who recommended trial of IV antibiotics prior to considering transtibial amputation. Sepsis picture now resolving, VSS and leukocytosis resolved. Patient remains afebrile. Blood cultures NGx1d. States pain is improved from yesterday and is continually getting better each day. Minimal redness on exam, no swelling. - Continue Vancomycin and Zosyn (2/2- ), narrow once cultures result - [facility] consulted, appreciate recs - Blood cultures pending - D/c MIVFs - Tylenol 650mg  q6hrs prn and Oxy IR 5mg  prn for severe pain - Zofran prn for nausea  T2DM: Uncontrolled. A1c this admission 11.9. Taking Lantus 55 units daily and Metformin 1000mg  bid at home. CBGs mid 200s overnight. - increase Lantus to 40 units qhs.  - Continue moderate SSI - Hold Metformin while hospitalized - Continue Gabapentin 100mg  tid for diabetic neuropathy - Will consider restarting Lisinopril 2.5mg  today  URI: Having rhinorrhea, congestion,  sore throat, and cough. Lungs clear. CXR negative. PCT presumably done as a part of the sepsis order set and was 0.18. - Treat symptomatically with nasal saline, chloraseptic spray, and tylenol prn - Will not obtain RVP as it will not change management  Hypovolemic hyponatremia: Na 125 > 135.  Improving with IV hydration. - s/p 2L NS bolus in the ED. Continue MIVFs.  - If no improvement, can consider obtaining serum osmolality, urinary osmolality, and urinary sodium to evaluate further.  Hx alcohol dependence: Last drink was 5 years ago. - Monitor - No need for CIWA at this time  Cocaine Use: First used cocaine for the first time yesterday because "his pain was unbearable". States he will never use it again. UDS positive for cocaine on admission. - Monitor  Depression: Stable. At his baseline of 78.  - Continue home Cymbalta  Thrombocytopenia: Chronic. Plts 72 2/3. Has a hx of alcohol abuse, but has not drank in 5 years and AST/ALT are normal. ITP unlikely due to chronicity. May have a congential platelet disorder. He also does have a history of borderline low WBC, so this may be a bone marrow issue. - Hold anticoagulation due to thrombocytopenia - Monitor with daily CBC, awaiting morning labs - Could consider outpatient work-up, although this would be difficult due to lack of insurance.  FEN/GI: Carb Modified Prophylaxis: No pharmacologic DVT prophylaxis due to thrombocytopenia, SCDs only to left leg due to severe right leg and foot pain.  Disposition: Home vs SNF/CIR if he has an amputation this admission. Anticipate discharge in the next 2-3 days.  Subjective:  Patient states foot pain is improved. Endorsing indigestion type pain. States he has had heartburn before but does not take  medication for it at home.  Objective: Temp:  [98 F (36.7 C)-98.2 F (36.8 C)] 98.2 F (36.8 C) (02/04 0515) Pulse Rate:  [63-75] 63 (02/04 0515) Resp:  [16] 16 (02/04 0515) BP:  (109-123)/(72-76) 117/72 (02/04 0515) SpO2:  [97 %-100 %] 99 % (02/04 0515) Weight:  [152 lb 5.4 oz (69.1 kg)] 152 lb 5.4 oz (69.1 kg) (02/04 16100508) Physical Exam: General: Well-appearing, sitting up in bed, in NAD Cardiovascular: RRR, no murmurs Respiratory: CTAB, normal work of breathing Abdomen: +BS, slightly tender to palpation in epigastric area, non-distended.  Extremities: R foot with minimal erythema, no warmth or swelling. Minimally tender to palpation, no fluctuance or drainage from ulcer.  Laboratory: Recent Labs  Lab 02/26/17 0958 02/27/17 0640  WBC 9.7 5.5  HGB 13.3 11.9*  HCT 36.3* 33.9*  PLT 86* 72*   Recent Labs  Lab 02/26/17 0958 02/27/17 0640  NA 125* 135  K 3.8 3.3*  CL 94* 103  CO2 19* 23  BUN 10 5*  CREATININE 0.67 0.66  CALCIUM 8.4* 7.6*  PROT 7.0  --   BILITOT 1.4*  --   ALKPHOS 151*  --   ALT 39  --   AST 40  --   GLUCOSE 334* 223*   Lactic acid 1.46 Procalcitonin 0.18 PT 15.6 INR 1.25  Imaging/Diagnostic Tests: Right foot x-ray: soft tissue defect, no underlying bony erosion to suggest osteomyelitis Right tib/fib x-ray: negative CXR: negative MRI right foot: Severe cellulitis. No osteomyelitis or fluid collection.  Ellwood DenseRumball, Hercules Hasler, DO 02/28/2017, 6:56 AM PGY-1, [facility] FPTS Intern pager: [phone], text pages welcome

## 2017-03-01 DIAGNOSIS — L03031 Cellulitis of right toe: Secondary | ICD-10-CM

## 2017-03-01 DIAGNOSIS — L02611 Cutaneous abscess of right foot: Secondary | ICD-10-CM

## 2017-03-01 LAB — BASIC METABOLIC PANEL
Anion gap: 10 (ref 5–15)
CO2: 28 mmol/L (ref 22–32)
CREATININE: 0.6 mg/dL — AB (ref 0.61–1.24)
Calcium: 8.4 mg/dL — ABNORMAL LOW (ref 8.9–10.3)
Chloride: 96 mmol/L — ABNORMAL LOW (ref 101–111)
GFR calc Af Amer: >60 mL/min (ref >60)
Glucose, Bld: 294 mg/dL — ABNORMAL HIGH (ref 65–99)
Potassium: 3.8 mmol/L (ref 3.5–5.1)
SODIUM: 134 mmol/L — AB (ref 135–145)

## 2017-03-01 LAB — CBC
HCT: 35.8 % — ABNORMAL LOW (ref 39.0–52.0)
Hemoglobin: 12.3 g/dL — ABNORMAL LOW (ref 13.0–17.0)
MCH: 30 pg (ref 26.0–34.0)
MCHC: 34.4 g/dL (ref 30.0–36.0)
MCV: 87.3 fL (ref 78.0–100.0)
Platelets: 92 10*3/uL — ABNORMAL LOW (ref 150–400)
RBC: 4.1 MIL/uL — AB (ref 4.22–5.81)
RDW: 12.8 % (ref 11.5–15.5)
WBC: 2.7 10*3/uL — ABNORMAL LOW (ref 4.0–10.5)

## 2017-03-01 LAB — GLUCOSE, CAPILLARY
GLUCOSE-CAPILLARY: 269 mg/dL — AB (ref 65–99)
Glucose-Capillary: 152 mg/dL — ABNORMAL HIGH (ref 65–99)
Glucose-Capillary: 109 mg/dL — ABNORMAL HIGH (ref 65–99)

## 2017-03-01 MED ORDER — SULFAMETHOXAZOLE-TRIMETHOPRIM 800-160 MG PO TABS
1.0000 | ORAL_TABLET | Freq: Two times a day (BID) | ORAL | Status: DC
  Administered 2017-03-01: 1 via ORAL
  Filled 2017-03-01: qty 1

## 2017-03-01 MED ORDER — SULFAMETHOXAZOLE-TRIMETHOPRIM 800-160 MG PO TABS: 1.0000 | ORAL_TABLET | Freq: Two times a day (BID) | ORAL | 0 refills | Status: AC

## 2017-03-01 MED ORDER — CIPROFLOXACIN HCL 500 MG PO TABS: 500.0000 mg | ORAL_TABLET | Freq: Two times a day (BID) | ORAL | 0 refills | Status: AC

## 2017-03-01 MED ORDER — INSULIN ASPART 100 UNIT/ML ~~LOC~~ SOLN: 10.0000 [IU] | Freq: Two times a day (BID) | SUBCUTANEOUS | 0 refills | Status: DC

## 2017-03-01 MED ORDER — CIPROFLOXACIN HCL 500 MG PO TABS
500.0000 mg | ORAL_TABLET | Freq: Two times a day (BID) | ORAL | Status: DC
  Administered 2017-03-01: 500 mg via ORAL
  Filled 2017-03-01: qty 1
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
## 2017-03-01 NOTE — Progress Notes (Signed)
Inpatient Diabetes Program Recommendations  AACE/ADA: New Consensus Statement on Inpatient Glycemic Control (2015)  Target Ranges:  Prepandial:   less than 140 mg/dL      Peak postprandial:   less than 180 mg/dL (1-2 hours)      Critically ill patients:  140 - 180 mg/dL   Lab Results  Component Value Date   GLUCAP 269 (H) 03/01/2017   HGBA1C 11.9 (H) 02/27/2017    Review of Glycemic Control  Diabetes history: DM2 Outpatient Diabetes medications: metformin 1000 mg bid Current orders for Inpatient glycemic control: Lantus 40 units QHS, Novolog 0-15 units tidwc and hs  Spoke with pt and wife at length regarding his HgbA1C of 11.9%. Discussed importance of getting his blood sugars in control to prevent long-term complications. Pt states he takes his Lantus every night and checks blood sugars 2xday.   Inpatient Diabetes Program Recommendations:     Increase Lantus to 50 units QHS Add Novolog 6 units tidwc if pt eats > 50% meal. Pt may need more affordable insulin at discharge. Has been receiving samples from MD office. May consider Novolin 70/30 insulin [facility]  Continue to follow for needs.  Thank you. Ailene Ardshonda Trino Higinbotham, RD, LDN, CDE Inpatient Diabetes Coordinator [phone]

## 2017-03-01 NOTE — Progress Notes (Signed)
Family Medicine Teaching Service Daily Progress Note Intern Pager: [phone]  Patient name: Mitchell [name] Medical record number: [id number] Date of birth: 03/28/1975 Age: 42 y.o. Gender: male  Primary Care Provider: Almon HerculesGonfa, Taye T, MD Consultants: Ortho Code Status: Full  Pt Overview and Major Events to Date:  02/26/17: Admitted to [facility]MTS with sepsis 2/2 infected diabetic foot ulcer of right foot.  Assessment and Plan: Mitchell [name] is a 42 y.o. male presenting with sepsis 2/2 infected diabetic ulcer of right foot. PMH is significant for uncontrolled T2DM, depression, chronic thrombocytopenia, hx alcohol abuse.  Sepsis 2/2 infected diabetic ulcer of right foot: Improved. Has had multiple diabetic foot infections of this right foot since 2013. 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 Dr. Lajoyce Cornersuda also recommended transtibial amputation in May 2017, but patient declined. Right foot MRI this admission without signs of osteomyelitis or a fluid collection. Ortho following, spoke with patient who wishes to salvage foot. Therefore Ortho recommended continuing antibiotics and will follow up outpatient in 1 week. Sepsis picture now resolved. Patient remains afebrile. Blood cultures NGx2d. States pain is improved from yesterday. No redness or swelling on exam. Wound with pressure dressing applied, dressing clean and dry. Received one dose of Oxy IR overnight for pain. - Transition IV Vancomycin and Zosyn (2/2- ) to oral abx today - [facility], appreciate recs - Blood cultures NG x48h - Tylenol 650mg  q6hrs prn and Oxy IR 5mg  prn for severe pain - Zofran prn for nausea  T2DM: Uncontrolled. A1c this admission 11.9. Taking Lantus 55 units daily and Metformin 1000mg  bid at home. CBGs mid 200s overnight after increase in Lantus to 40 units. Spoke with patient regarding possible Novolog use at home and patient did not seem pleased with the idea however stated he would do as recommended. -  continue Lantus 40 units qhs.  - Continue moderate SSI - Hold Metformin while hospitalized - Continue Gabapentin 100mg  tid for diabetic neuropathy - Continue holding home Lisinopril 2.5mg , can likely restart on discharge  URI: Having rhinorrhea, congestion, sore throat, and cough. Lungs clear. CXR negative. PCT presumably done as a part of the sepsis order set and was 0.18. - Treat symptomatically with nasal saline, chloraseptic spray, and tylenol prn - Will not obtain RVP as it will not change management  Hypovolemic hyponatremia, resolved: Na 125 > 135.  - s/p 2L NS bolus in the ED and mIVF  Hx alcohol dependence: Last drink was 5 years ago. - Monitor - No need for CIWA at this time  Cocaine Use: First used cocaine for the first time day prior to admission because "his pain was unbearable". States he will never use it again. UDS positive for cocaine on admission. - Monitor  Depression: Stable. At his baseline of 6686.  - Continue home Cymbalta  Thrombocytopenia: Chronic. Plts 92 2/5. Has a hx of alcohol abuse, but has not drank in 5 years and AST/ALT are normal. ITP unlikely due to chronicity. May have a congential platelet disorder. He also does have a history of borderline low WBC, so this may be a bone marrow issue. - Hold anticoagulation due to thrombocytopenia - Monitor with daily CBC, awaiting morning labs - Could consider outpatient work-up, although this would be difficult due to lack of insurance.  FEN/GI: Carb Modified Prophylaxis: No pharmacologic DVT prophylaxis due to thrombocytopenia, SCDs only to left leg due to severe right leg and foot pain.  Disposition: likely home today with ortho and PCP f/u  outpt  Subjective:  Patient states foot pain is improved. Not keen on idea of using Novolog but states he will do as recommended.   Objective: Temp:  [97.9 F (36.6 C)-98.3 F (36.8 C)] 98.2 F (36.8 C) (02/05 0605) Pulse Rate:  [63-71] 63 (02/05 0605) Resp:   [16-18] 16 (02/05 0827) BP: (106-132)/(68-81) 106/68 (02/05 0605) SpO2:  [98 %-100 %] 100 % (02/05 5409) Physical Exam: General: Well-appearing, lying in bed, in NAD Cardiovascular: RRR, no murmurs Respiratory: CTAB, normal work of breathing Abdomen: +BS, NTND  Extremities: R foot with minimal erythema, no warmth or swelling. Dressing applied over wound, clean and dry.   Laboratory: Recent Labs  Lab 02/27/17 0640 02/28/17 0905 03/01/17 0547  WBC 5.5 3.4* 2.7*  HGB 11.9* 12.2* 12.3*  HCT 33.9* 33.7* 35.8*  PLT 72* 81* 92*   Recent Labs  Lab 02/26/17 0958 02/27/17 0640 02/28/17 0905 03/01/17 0547  NA 125* 135 137 134*  K 3.8 3.3* 3.0* 3.8  CL 94* 103 102 96*  CO2 19* 23 25 28   BUN 10 5* 5* <5*  CREATININE 0.67 0.66 0.66 0.60*  CALCIUM 8.4* 7.6* 7.7* 8.4*  PROT 7.0  --   --   --   BILITOT 1.4*  --   --   --   ALKPHOS 151*  --   --   --   ALT 39  --   --   --   AST 40  --   --   --   GLUCOSE 334* 223* 149* 294*   Lactic acid 1.46 Procalcitonin 0.18 PT 15.6 INR 1.25  Imaging/Diagnostic Tests: Right foot x-ray: soft tissue defect, no underlying bony erosion to suggest osteomyelitis Right tib/fib x-ray: negative CXR: negative MRI right foot: Severe cellulitis. No osteomyelitis or fluid collection.  Ellwood Dense, DO 03/01/2017, 9:10 AM PGY-1, [facility] FPTS Intern pager: [phone], text pages welcome

## 2017-03-01 NOTE — Care Management Note (Signed)
Case Management Note  Patient Details  Name: Mitchell [name] MRN: [id number] Date of Birth: 06/15/1975  Subjective/Objective:   Admitted with sepsis infected diabetic right foot ulcer.                 Action/Plan: Case manager provided patient with MATCH Letter for his insulin and Bactrim.   Expected Discharge Date:  03/01/17               Expected Discharge Plan:  Home w Home Health Services  In-House Referral:  NA  Discharge planning Services  CM Consult, [facility], [facility]  Post Acute Care Choice:  NA Choice offered to:  Patient  DME Arranged:  N/A DME Agency:  NA  HH Arranged:  NA HH Agency:  NA  Status of Service:  Completed, signed off  If discussed at Long Length of Stay Meetings, dates discussed:    Additional Comments:  Durenda GuthrieBrady, Giankarlo Leamer Naomi, RN 03/01/2017, 4:11 PM

## 2017-03-01 NOTE — Discharge Instructions (Signed)
Fue ingresado por empeoramiento de la lcera del pie diabtico. Usted fue tratado con antibiticos con buena respuesta. Seguir con el Dr. Lajoyce Cornersuda, el mdico ortopdico [facility]. Tambin har un seguimiento con el Dr. Alanda SlimGonfa en el centro de medicina familiar la prxima semana. Se est iniciando con insulina [employer] el da. Tome esto en sus 2 comidas ms grandes del da. Es importante tener un buen control del azcar en la sangre para curar su pie y evitar la amputacin.

## 2017-03-01 NOTE — Progress Notes (Signed)
Patient will be started on Novolog 10 units BID to be taken with the two largest meals of the day (lunch and dinner). This medication  will be added to the patient's current Lantus regimen.   Samples of this drug were given to the patient, quantity one x 100 units/ml (10 ml), Lot Number [id number], and 4 x 10 Ultra-fine syringes.  Patient has been counseled on insulin use, proper administration technique, and adverse effects recognition and management.   Patient seen with Karna DupesMedina Rasul, PharmD Candidate.   Adline PotterSabrina Arvis Zwahlen, PharmD Pharmacy Resident Pager: [phone]

## 2017-03-03 LAB — CULTURE, BLOOD (ROUTINE X 2)
Culture: NO GROWTH
Culture: NO GROWTH
SPECIAL REQUESTS: ADEQUATE
Special Requests: ADEQUATE

## 2017-03-07 ENCOUNTER — Inpatient Hospital Stay: Payer: Self-pay | Admitting: Student

## 2017-03-10 ENCOUNTER — Inpatient Hospital Stay: Payer: Self-pay | Admitting: Student

## 2017-03-21 ENCOUNTER — Encounter: Payer: Self-pay | Admitting: Psychology

## 2017-03-21 NOTE — Progress Notes (Signed)
I called this patient to check on him, as he was recently hospitalized for sepsis. He agreed to come in for a integrated care visit to discuss strategies for managing his diabetes. His appointment has been scheduled for March 4 at 9:00 a.m.

## 2017-03-28 ENCOUNTER — Ambulatory Visit: Payer: Self-pay

## 2017-05-23 ENCOUNTER — Encounter: Payer: Self-pay | Admitting: Psychology

## 2017-05-23 NOTE — Progress Notes (Signed)
I called this patient to set up a follow-up appointment to discuss his diabetes, but received voicemail. The interpreter left a message asking him to call [facility] to set up a follow-up appointment at his convenience.

## 2017-11-02 ENCOUNTER — Ambulatory Visit (INDEPENDENT_AMBULATORY_CARE_PROVIDER_SITE_OTHER): Payer: Self-pay | Admitting: Student in an Organized Health Care Education/Training Program

## 2017-11-02 ENCOUNTER — Other Ambulatory Visit: Payer: Self-pay

## 2017-11-02 ENCOUNTER — Encounter: Payer: Self-pay | Admitting: Student in an Organized Health Care Education/Training Program

## 2017-11-02 VITALS — BP 120/74 | HR 65 | Temp 98.0°F | Wt 150.2 lb

## 2017-11-02 DIAGNOSIS — E11621 Type 2 diabetes mellitus with foot ulcer: Secondary | ICD-10-CM

## 2017-11-02 DIAGNOSIS — E118 Type 2 diabetes mellitus with unspecified complications: Secondary | ICD-10-CM

## 2017-11-02 DIAGNOSIS — L97419 Non-pressure chronic ulcer of right heel and midfoot with unspecified severity: Secondary | ICD-10-CM

## 2017-11-02 DIAGNOSIS — Z794 Long term (current) use of insulin: Secondary | ICD-10-CM

## 2017-11-02 LAB — POCT GLYCOSYLATED HEMOGLOBIN (HGB A1C): HbA1c, POC (controlled diabetic range): 13.4 % — AB (ref 0.0–7.0)

## 2017-11-02 NOTE — Progress Notes (Signed)
CC: diabetes  HPI: Mitchell [name] is a 42 y.o. male with PMH significant for T2DM, Diabetic foot ulcer, GERD, history of alcohol abuse,  who presents to [facility] today for follow up of diabetes.   Spanish interpreter on ipad.  DIABETES - Patient has not been in for a diabetes check since 02/2017, and at that time his A1c was 11.9, worsened today at 13.4.  He reports he has been taking metformin 1000 mg BID, Lantus 50u QHS, and novolog 10-15u variably, sometimes in the morning. He has not been taking his blood sugars.  He reports poor adherence and seems to be missing many doses of his short acting and some doses of his long acting insulin. He takes only one metformin per day usually. His issues with adherence stem from the fact that he works the night shift and eats at varying times throughout the day and night.  Patient reports he does have his diabetic supplies and refills. He understands the risks of having diabetes so poorly controlled and cites risks such as stroke and heart attack when asked about his understanding of his risks.  FOOT ULCER - Patient has a left foot ulcer that has been present for "a long time." he keeps an eye on it and has not noticed any worsening of drainage. He has not been febrile. He has not noticed any surrounding erythema.   Media Information   Document Information   Photos    11/02/2017 09:56  Attached To:  Office Visit on 11/02/17 with Howard Pouch, MD  Source Information   Howard Pouch, MD  [facility] Resident      Monitoring Labs and Parameters Last A1C:  Lab Results  Component Value Date   HGBA1C 13.4 (A) 11/02/2017    Last Lipid:     Component Value Date/Time   CHOL 130 01/21/2016 0339   HDL 31 (L) 01/21/2016 0339    Last Bmet  Potassium  Date Value Ref Range Status  03/01/2017 3.8 3.5 - 5.1 mmol/L Final   Sodium  Date Value Ref Range Status  03/01/2017 134 (L) 135 - 145 mmol/L Final   Creat  Date Value Ref Range Status    12/10/2015 0.65 0.60 - 1.35 mg/dL Final   Creatinine, Ser  Date Value Ref Range Status  03/01/2017 0.60 (L) 0.61 - 1.24 mg/dL Final      Last BPs:  BP Readings from Last 3 Encounters:  11/02/17 120/74  03/01/17 104/64  11/24/16 118/68    Review of Symptoms:  See HPI for ROS.   CC, SH/smoking status, and VS noted.  Objective: BP 120/74   Pulse 65   Temp 98 F (36.7 C) (Oral)   Wt 150 lb 3.2 oz (68.1 kg)   SpO2 99%   BMI 25.78 kg/m  GEN: NAD, alert, cooperative EYE: no conjunctival injection, pupils equally round and reactive to light ENMT: normal tympanic light reflex, no nasal polyps,no rhinorrhea, no pharyngeal erythema or exudates NECK: full ROM, no thyromegaly RESPIRATORY: clear to auscultation bilaterally with no wheezes, rhonchi or rales, good effort CV: RRR, no m/r/g, no peripheral edema GI: soft, non-tender, non-distended, no hepatosplenomegaly SKIN: left lateral foot ulcer, stage 2, no drainage, dry, no surrounding erythema or warmth NEURO: II-XII grossly intact, normal gait, peripheral sensation intact PSYCH: AAOx3, appropriate affect  Assessment and plan: 1. Type 2 diabetes mellitus with complication, with long-term current use of insulin (HCC) Patient needs to follow up in our clinic more often so we can work with  him to get his diabetes under control. We had a long discussion today regarding risks of poorly controlled diabetes including cardiovascular risk, as well as risk of renal failure, limb amputation, blindness. He needs to start taking his medications as prescribed. - increase lantus to 55u daily - restart meds as prescribed - discussed strategies for adherence - asked him to check blood sugars, record fasting blood glucose - glucose log provided - patient to bring glucose log and glucometer to appointment with Dr. Raymondo Band, which he will schedule as he leaves the office today - follow up for next A1c or sooner as needed - at next visit would consider  checking basic labs - urine microalbumin deferred as patient is already on ACE  2. Diabetic ulcer of right midfoot associated with type 2 diabetes mellitus, unspecified ulcer stage (HCC) This is not acutely infected and does not seem to need debridement. It is very shallow and does not probe down very far. - use gauze for cushioning to offload pressure from that area of the foot - Ambulatory referral to Podiatry  Orders Placed This Encounter  Procedures  . Ambulatory referral to Podiatry    Referral Priority:   Routine    Referral Type:   Consultation    Referral Reason:   Specialty Services Required    Requested Specialty:   Podiatry    Number of Visits Requested:   1  . HgB A1c    Howard Pouch, MD,MS,  PGY3 11/05/2017 10:54 PM

## 2017-11-02 NOTE — Patient Instructions (Addendum)
It was a pleasure seeing you today in our clinic.  Here is the treatment plan we have discussed and agreed upon together:  - increase lantus to 55u  - bring glucometer - bring sugars that you wrote down  A consult was placed to the foot doctor at today's visit.  You will receive a call to schedule an appointment. If you do not receive a call within two weeks please call our office so we can place the consult again.   Please schedule a visit with Dr. Raymondo Band  Our clinic's number is [phone]. Please call with questions or concerns about what we discussed today.  Be well, Dr. Mosetta Putt  Fasting Blood Sugar Record  Day  Date      Blood      Time:     Sugar  Day  Date      Blood      Time:     Sugar                                                                                                                                                                                                                         If a reading is high or low, then look back to what the last meal/snack was:  Write down what you last ate, how much, & what time.

## 2017-11-16 ENCOUNTER — Inpatient Hospital Stay (HOSPITAL_COMMUNITY)
Admission: EM | Admit: 2017-11-16 | Discharge: 2017-11-17 | DRG: 603 | Disposition: A | Discharge status: Home / Self Care | Payer: Self-pay | Attending: Family Medicine | Admitting: Family Medicine

## 2017-11-16 ENCOUNTER — Emergency Department (HOSPITAL_COMMUNITY): Payer: Self-pay

## 2017-11-16 ENCOUNTER — Other Ambulatory Visit: Payer: Self-pay

## 2017-11-16 ENCOUNTER — Encounter (HOSPITAL_COMMUNITY): Payer: Self-pay | Admitting: *Deleted

## 2017-11-16 DIAGNOSIS — D649 Anemia, unspecified: Secondary | ICD-10-CM

## 2017-11-16 DIAGNOSIS — K7010 Alcoholic hepatitis without ascites: Secondary | ICD-10-CM | POA: Diagnosis present

## 2017-11-16 DIAGNOSIS — L03115 Cellulitis of right lower limb: Secondary | ICD-10-CM

## 2017-11-16 DIAGNOSIS — Z915 Personal history of self-harm: Secondary | ICD-10-CM

## 2017-11-16 DIAGNOSIS — D61818 Other pancytopenia: Secondary | ICD-10-CM

## 2017-11-16 DIAGNOSIS — E1100 Type 2 diabetes mellitus with hyperosmolarity without nonketotic hyperglycemic-hyperosmolar coma (NKHHC): Secondary | ICD-10-CM | POA: Insufficient documentation

## 2017-11-16 DIAGNOSIS — Z89411 Acquired absence of right great toe: Secondary | ICD-10-CM

## 2017-11-16 DIAGNOSIS — Z7189 Other specified counseling: Secondary | ICD-10-CM

## 2017-11-16 DIAGNOSIS — K219 Gastro-esophageal reflux disease without esophagitis: Secondary | ICD-10-CM | POA: Diagnosis present

## 2017-11-16 DIAGNOSIS — E11621 Type 2 diabetes mellitus with foot ulcer: Secondary | ICD-10-CM | POA: Diagnosis present

## 2017-11-16 DIAGNOSIS — E440 Moderate protein-calorie malnutrition: Secondary | ICD-10-CM | POA: Diagnosis present

## 2017-11-16 DIAGNOSIS — L089 Local infection of the skin and subcutaneous tissue, unspecified: Secondary | ICD-10-CM

## 2017-11-16 DIAGNOSIS — F329 Major depressive disorder, single episode, unspecified: Secondary | ICD-10-CM | POA: Diagnosis present

## 2017-11-16 DIAGNOSIS — Z833 Family history of diabetes mellitus: Secondary | ICD-10-CM

## 2017-11-16 DIAGNOSIS — E1165 Type 2 diabetes mellitus with hyperglycemia: Secondary | ICD-10-CM | POA: Diagnosis present

## 2017-11-16 DIAGNOSIS — L97519 Non-pressure chronic ulcer of other part of right foot with unspecified severity: Secondary | ICD-10-CM | POA: Diagnosis present

## 2017-11-16 DIAGNOSIS — Z79899 Other long term (current) drug therapy: Secondary | ICD-10-CM

## 2017-11-16 DIAGNOSIS — F1020 Alcohol dependence, uncomplicated: Secondary | ICD-10-CM | POA: Diagnosis present

## 2017-11-16 DIAGNOSIS — F39 Unspecified mood [affective] disorder: Secondary | ICD-10-CM | POA: Diagnosis present

## 2017-11-16 DIAGNOSIS — Z794 Long term (current) use of insulin: Secondary | ICD-10-CM

## 2017-11-16 DIAGNOSIS — L909 Atrophic disorder of skin, unspecified: Secondary | ICD-10-CM | POA: Diagnosis present

## 2017-11-16 DIAGNOSIS — Z856 Personal history of leukemia: Secondary | ICD-10-CM

## 2017-11-16 DIAGNOSIS — R739 Hyperglycemia, unspecified: Secondary | ICD-10-CM

## 2017-11-16 DIAGNOSIS — K2920 Alcoholic gastritis without bleeding: Secondary | ICD-10-CM | POA: Diagnosis present

## 2017-11-16 DIAGNOSIS — D696 Thrombocytopenia, unspecified: Secondary | ICD-10-CM

## 2017-11-16 HISTORY — DX: Cellulitis of right lower limb: L03.115

## 2017-11-16 LAB — CBC WITH DIFFERENTIAL/PLATELET
ABS IMMATURE GRANULOCYTES: 0.01 10*3/uL (ref 0.00–0.07)
BASOS PCT: 1 %
Basophils Absolute: 0 10*3/uL (ref 0.0–0.1)
EOS ABS: 0.1 10*3/uL (ref 0.0–0.5)
Eosinophils Relative: 2 %
HCT: 42.3 % (ref 39.0–52.0)
Hemoglobin: 14.4 g/dL (ref 13.0–17.0)
IMMATURE GRANULOCYTES: 0 %
Lymphocytes Relative: 24 %
Lymphs Abs: 0.8 10*3/uL (ref 0.7–4.0)
MCH: 29.5 pg (ref 26.0–34.0)
MCHC: 34 g/dL (ref 30.0–36.0)
MCV: 86.7 fL (ref 80.0–100.0)
MONO ABS: 0.3 10*3/uL (ref 0.1–1.0)
Monocytes Relative: 10 %
NEUTROS ABS: 2.1 10*3/uL (ref 1.7–7.7)
NRBC: 0 % (ref 0.0–0.2)
Neutrophils Relative %: 63 %
PLATELETS: 90 10*3/uL — AB (ref 150–400)
RBC: 4.88 MIL/uL (ref 4.22–5.81)
RDW: 13.3 % (ref 11.5–15.5)
WBC: 3.3 10*3/uL — AB (ref 4.0–10.5)

## 2017-11-16 LAB — COMPREHENSIVE METABOLIC PANEL
ALK PHOS: 103 U/L (ref 38–126)
ALT: 24 U/L (ref 0–44)
ALT: 21 U/L (ref 0–44)
AST: 27 U/L (ref 15–41)
AST: 24 U/L (ref 15–41)
Albumin: 3.5 g/dL (ref 3.5–5.0)
Albumin: 3.1 g/dL — ABNORMAL LOW (ref 3.5–5.0)
Alkaline Phosphatase: 111 U/L (ref 38–126)
Anion gap: 7 (ref 5–15)
Anion gap: 7 (ref 5–15)
BILIRUBIN TOTAL: 1.2 mg/dL (ref 0.3–1.2)
BUN: 10 mg/dL (ref 6–20)
BUN: 11 mg/dL (ref 6–20)
CALCIUM: 8 mg/dL — AB (ref 8.9–10.3)
CO2: 24 mmol/L (ref 22–32)
CO2: 22 mmol/L (ref 22–32)
CREATININE: 0.72 mg/dL (ref 0.61–1.24)
Calcium: 8.5 mg/dL — ABNORMAL LOW (ref 8.9–10.3)
Chloride: 98 mmol/L (ref 98–111)
Chloride: 105 mmol/L (ref 98–111)
Creatinine, Ser: 0.87 mg/dL (ref 0.61–1.24)
GFR calc Af Amer: >60 mL/min (ref >60)
GFR calc non Af Amer: >60 mL/min (ref >60)
GLUCOSE: 433 mg/dL — AB (ref 70–99)
Glucose, Bld: 577 mg/dL (ref 70–99)
POTASSIUM: 3.9 mmol/L (ref 3.5–5.1)
Potassium: 3.4 mmol/L — ABNORMAL LOW (ref 3.5–5.1)
Sodium: 129 mmol/L — ABNORMAL LOW (ref 135–145)
Sodium: 134 mmol/L — ABNORMAL LOW (ref 135–145)
TOTAL PROTEIN: 7.3 g/dL (ref 6.5–8.1)
TOTAL PROTEIN: 6.6 g/dL (ref 6.5–8.1)
Total Bilirubin: 0.8 mg/dL (ref 0.3–1.2)

## 2017-11-16 LAB — I-STAT CG4 LACTIC ACID, ED
LACTIC ACID, VENOUS: 2.44 mmol/L — AB (ref 0.5–1.9)
Lactic Acid, Venous: 2.81 mmol/L (ref 0.5–1.9)

## 2017-11-16 LAB — CBG MONITORING, ED
GLUCOSE-CAPILLARY: 587 mg/dL — AB (ref 70–99)
GLUCOSE-CAPILLARY: 406 mg/dL — AB (ref 70–99)
Glucose-Capillary: 339 mg/dL — ABNORMAL HIGH (ref 70–99)

## 2017-11-16 LAB — LACTIC ACID, PLASMA: Lactic Acid, Venous: 1.4 mmol/L (ref 0.5–1.9)

## 2017-11-16 MED ORDER — VANCOMYCIN HCL 10 G IV SOLR
1500.0000 mg | Freq: Once | INTRAVENOUS | Status: AC
  Administered 2017-11-16: 1500 mg via INTRAVENOUS
  Filled 2017-11-16: qty 1500

## 2017-11-16 MED ORDER — PIPERACILLIN-TAZOBACTAM 3.375 G IVPB 30 MIN
3.3750 g | Freq: Once | INTRAVENOUS | Status: AC
  Administered 2017-11-16: 3.375 g via INTRAVENOUS
  Filled 2017-11-16: qty 50

## 2017-11-16 MED ORDER — INSULIN REGULAR BOLUS VIA INFUSION
0.0000 [IU] | Freq: Three times a day (TID) | INTRAVENOUS | Status: DC
  Filled 2017-11-16: qty 10

## 2017-11-16 MED ORDER — ACETAMINOPHEN 650 MG RE SUPP: 650.0000 mg | Freq: Four times a day (QID) | RECTAL | Status: DC | PRN

## 2017-11-16 MED ORDER — DEXTROSE 50 % IV SOLN: 25.0000 mL | INTRAVENOUS | Status: DC | PRN

## 2017-11-16 MED ORDER — ACETAMINOPHEN 325 MG PO TABS
650.0000 mg | ORAL_TABLET | Freq: Four times a day (QID) | ORAL | Status: DC | PRN
  Administered 2017-11-16 – 2017-11-17 (×2): 650 mg via ORAL
  Filled 2017-11-16 (×2): qty 2

## 2017-11-16 MED ORDER — PIPERACILLIN-TAZOBACTAM 3.375 G IVPB
3.3750 g | Freq: Three times a day (TID) | INTRAVENOUS | Status: DC
  Administered 2017-11-17: 3.375 g via INTRAVENOUS
  Filled 2017-11-16 (×2): qty 50

## 2017-11-16 MED ORDER — ONDANSETRON HCL 4 MG/2ML IJ SOLN: 4.0000 mg | Freq: Four times a day (QID) | INTRAMUSCULAR | Status: DC | PRN

## 2017-11-16 MED ORDER — SODIUM CHLORIDE 0.9 % IV BOLUS
1000.0000 mL | Freq: Once | INTRAVENOUS | Status: AC
  Administered 2017-11-16: 1000 mL via INTRAVENOUS

## 2017-11-16 MED ORDER — VANCOMYCIN HCL IN DEXTROSE 1-5 GM/200ML-% IV SOLN
1000.0000 mg | Freq: Three times a day (TID) | INTRAVENOUS | Status: DC
  Administered 2017-11-17: 1000 mg via INTRAVENOUS
  Filled 2017-11-16 (×2): qty 200

## 2017-11-16 MED ORDER — ONDANSETRON HCL 4 MG PO TABS: 4.0000 mg | ORAL_TABLET | Freq: Four times a day (QID) | ORAL | Status: DC | PRN

## 2017-11-16 MED ORDER — DEXTROSE-NACL 5-0.45 % IV SOLN
INTRAVENOUS | Status: DC
  Administered 2017-11-17: via INTRAVENOUS

## 2017-11-16 MED ORDER — INSULIN REGULAR(HUMAN) IN NACL 100-0.9 UT/100ML-% IV SOLN
INTRAVENOUS | Status: DC
  Administered 2017-11-16: 3.5 [IU] via INTRAVENOUS
  Filled 2017-11-16: qty 100

## 2017-11-16 MED ORDER — OXYCODONE-ACETAMINOPHEN 5-325 MG PO TABS
1.0000 | ORAL_TABLET | Freq: Once | ORAL | Status: AC
  Administered 2017-11-16: 1 via ORAL
  Filled 2017-11-16: qty 1

## 2017-11-16 MED ORDER — LORAZEPAM 2 MG/ML IJ SOLN: 1.0000 mg | Freq: Once | INTRAMUSCULAR | Status: DC | PRN

## 2017-11-16 MED ORDER — ENOXAPARIN SODIUM 40 MG/0.4ML ~~LOC~~ SOLN
40.0000 mg | Freq: Every day | SUBCUTANEOUS | Status: DC
  Administered 2017-11-17: 40 mg via SUBCUTANEOUS
  Filled 2017-11-16: qty 0.4

## 2017-11-16 MED ORDER — ADULT MULTIVITAMIN W/MINERALS CH
1.0000 | ORAL_TABLET | Freq: Every day | ORAL | Status: DC
  Administered 2017-11-16 – 2017-11-17 (×2): 1 via ORAL
  Filled 2017-11-16 (×2): qty 1

## 2017-11-16 MED ORDER — KETOROLAC TROMETHAMINE 30 MG/ML IJ SOLN
30.0000 mg | Freq: Once | INTRAMUSCULAR | Status: AC
  Administered 2017-11-17: 30 mg via INTRAVENOUS
  Filled 2017-11-16: qty 1

## 2017-11-16 MED ORDER — GLUCERNA SHAKE PO LIQD
237.0000 mL | Freq: Two times a day (BID) | ORAL | Status: DC
  Administered 2017-11-17 (×2): 237 mL via ORAL
  Filled 2017-11-16: qty 237

## 2017-11-16 MED ORDER — DULOXETINE HCL 60 MG PO CPEP
60.0000 mg | ORAL_CAPSULE | Freq: Every day | ORAL | Status: DC
  Administered 2017-11-17: 60 mg via ORAL
  Filled 2017-11-16: qty 1

## 2017-11-16 MED ORDER — SODIUM CHLORIDE 0.9 % IV SOLN
INTRAVENOUS | Status: DC
  Administered 2017-11-16: 22:00:00 via INTRAVENOUS

## 2017-11-16 MED ORDER — POLYETHYLENE GLYCOL 3350 17 G PO PACK: 17.0000 g | PACK | Freq: Every day | ORAL | Status: DC | PRN

## 2017-11-16 NOTE — ED Notes (Signed)
Paged admitting to [facility]

## 2017-11-16 NOTE — ED Notes (Signed)
Admitting team at bedside.

## 2017-11-16 NOTE — ED Triage Notes (Signed)
Pt in c/o wound to his right foot, increased pain and drainage, the wound has been there for several weeks

## 2017-11-16 NOTE — Progress Notes (Signed)
Pharmacy Antibiotic Note  Mitchell [name] is a 42 y.o. male admitted on 11/16/2017 with Wound Infection.  Pt c/o wound in right foot with increased pain and drainage; wound has been there for several weeks. PMH significant for T2DM, GERD, depression, alcoholic hepatitis, and osteomyelitis of right foot s/p right first digit amputation. Pharmacy has been consulted for Vancomycin and Zosyn dosing. Scr-0.87, WBC - 3.3, afebrile, lactic acid - 2.81   Plan: Zosyn 3.375g q8h Vancomycin 1500mg  x1, then Vancomycin 1g q8h Monitor and adjust per renal fx, clinical status, C&S, vanc troughs as needed     Temp (24hrs), Avg:98.3 F (36.8 C), Min:97.8 F (36.6 C), Max:98.8 F (37.1 C)  Recent Labs  Lab 11/16/17 1443 11/16/17 1459 11/16/17 1820  WBC 3.3*  --   --   CREATININE 0.87  --   --   LATICACIDVEN  --  2.44* 2.81*    CrCl cannot be calculated (Unknown ideal weight.).    No Known Allergies  Antimicrobials this admission: Zosyn 10/23 >>  Vancomycin 10/23 >>   Dose adjustments this admission: N/A  Microbiology results: Pending  Thank you for allowing pharmacy to be a part of this patient's care.  Koleen Nimrod 11/16/2017 8:02 PM
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
## 2017-11-16 NOTE — ED Notes (Signed)
ED Provider at bedside. 
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
## 2017-11-17 ENCOUNTER — Inpatient Hospital Stay (HOSPITAL_COMMUNITY): Payer: Self-pay

## 2017-11-17 DIAGNOSIS — Z7189 Other specified counseling: Secondary | ICD-10-CM

## 2017-11-17 DIAGNOSIS — D61818 Other pancytopenia: Secondary | ICD-10-CM

## 2017-11-17 DIAGNOSIS — E440 Moderate protein-calorie malnutrition: Secondary | ICD-10-CM

## 2017-11-17 DIAGNOSIS — E1065 Type 1 diabetes mellitus with hyperglycemia: Secondary | ICD-10-CM

## 2017-11-17 DIAGNOSIS — Z79899 Other long term (current) drug therapy: Secondary | ICD-10-CM

## 2017-11-17 HISTORY — DX: Other pancytopenia: D61.818

## 2017-11-17 LAB — COMPREHENSIVE METABOLIC PANEL
ALBUMIN: 2.8 g/dL — AB (ref 3.5–5.0)
ALK PHOS: 85 U/L (ref 38–126)
ALT: 20 U/L (ref 0–44)
ANION GAP: 5 (ref 5–15)
AST: 20 U/L (ref 15–41)
BUN: 10 mg/dL (ref 6–20)
CALCIUM: 8.1 mg/dL — AB (ref 8.9–10.3)
CO2: 23 mmol/L (ref 22–32)
Chloride: 110 mmol/L (ref 98–111)
Creatinine, Ser: 0.59 mg/dL — ABNORMAL LOW (ref 0.61–1.24)
GFR calc Af Amer: >60 mL/min (ref >60)
GFR calc non Af Amer: >60 mL/min (ref >60)
Glucose, Bld: 136 mg/dL — ABNORMAL HIGH (ref 70–99)
Potassium: 3 mmol/L — ABNORMAL LOW (ref 3.5–5.1)
SODIUM: 138 mmol/L (ref 135–145)
Total Bilirubin: 1 mg/dL (ref 0.3–1.2)
Total Protein: 6 g/dL — ABNORMAL LOW (ref 6.5–8.1)

## 2017-11-17 LAB — CBC
HEMATOCRIT: 35.1 % — AB (ref 39.0–52.0)
Hemoglobin: 12.4 g/dL — ABNORMAL LOW (ref 13.0–17.0)
MCH: 30.7 pg (ref 26.0–34.0)
MCHC: 35.3 g/dL (ref 30.0–36.0)
MCV: 86.9 fL (ref 80.0–100.0)
NRBC: 0 % (ref 0.0–0.2)
Platelets: 82 10*3/uL — ABNORMAL LOW (ref 150–400)
RBC: 4.04 MIL/uL — ABNORMAL LOW (ref 4.22–5.81)
RDW: 13.4 % (ref 11.5–15.5)
WBC: 2.8 10*3/uL — AB (ref 4.0–10.5)

## 2017-11-17 LAB — CBG MONITORING, ED
GLUCOSE-CAPILLARY: 142 mg/dL — AB (ref 70–99)
Glucose-Capillary: 195 mg/dL — ABNORMAL HIGH (ref 70–99)

## 2017-11-17 LAB — GLUCOSE, CAPILLARY
GLUCOSE-CAPILLARY: 244 mg/dL — AB (ref 70–99)
GLUCOSE-CAPILLARY: 305 mg/dL — AB (ref 70–99)
Glucose-Capillary: 386 mg/dL — ABNORMAL HIGH (ref 70–99)

## 2017-11-17 LAB — LACTIC ACID, PLASMA: Lactic Acid, Venous: 1.1 mmol/L (ref 0.5–1.9)

## 2017-11-17 MED ORDER — ACETAMINOPHEN 325 MG PO TABS: 650.0000 mg | ORAL_TABLET | Freq: Four times a day (QID) | ORAL | 0 refills | Status: DC | PRN

## 2017-11-17 MED ORDER — INSULIN ASPART PROT & ASPART (70-30 MIX) 100 UNIT/ML ~~LOC~~ SUSP: 10.0000 [IU] | Freq: Every day | SUBCUTANEOUS | 11 refills | Status: DC

## 2017-11-17 MED ORDER — METFORMIN HCL 1000 MG PO TABS: 1000.0000 mg | ORAL_TABLET | Freq: Two times a day (BID) | ORAL | 0 refills | Status: DC

## 2017-11-17 MED ORDER — INSULIN GLARGINE 100 UNIT/ML ~~LOC~~ SOLN
15.0000 [IU] | SUBCUTANEOUS | Status: DC
  Administered 2017-11-17: 15 [IU] via SUBCUTANEOUS
  Filled 2017-11-17 (×2): qty 0.15

## 2017-11-17 MED ORDER — INSULIN ASPART PROT & ASPART (70-30 MIX) 100 UNIT/ML ~~LOC~~ SUSP: 20.0000 [IU] | Freq: Every day | SUBCUTANEOUS | 11 refills | Status: DC

## 2017-11-17 MED ORDER — GABAPENTIN 100 MG PO CAPS: 100.0000 mg | ORAL_CAPSULE | Freq: Every day | ORAL | 0 refills | Status: DC

## 2017-11-17 MED ORDER — POTASSIUM CHLORIDE 20 MEQ PO PACK
40.0000 meq | PACK | Freq: Two times a day (BID) | ORAL | Status: DC
  Administered 2017-11-17: 40 meq via ORAL
  Filled 2017-11-17 (×2): qty 2

## 2017-11-17 MED ORDER — METFORMIN HCL 500 MG PO TABS
1000.0000 mg | ORAL_TABLET | Freq: Two times a day (BID) | ORAL | Status: DC
  Administered 2017-11-17: 1000 mg via ORAL
  Filled 2017-11-17: qty 2

## 2017-11-17 MED ORDER — GADOBUTROL 1 MMOL/ML IV SOLN
7.5000 mL | Freq: Once | INTRAVENOUS | Status: AC | PRN
  Administered 2017-11-17: 6 mL via INTRAVENOUS

## 2017-11-17 MED ORDER — AMOXICILLIN-POT CLAVULANATE 875-125 MG PO TABS: 1.0000 | ORAL_TABLET | Freq: Two times a day (BID) | ORAL | 0 refills | Status: AC

## 2017-11-17 MED ORDER — KETOROLAC TROMETHAMINE 30 MG/ML IJ SOLN
30.0000 mg | Freq: Once | INTRAMUSCULAR | Status: AC
  Administered 2017-11-17: 30 mg via INTRAVENOUS
  Filled 2017-11-17: qty 1

## 2017-11-17 MED ORDER — INSULIN ASPART 100 UNIT/ML ~~LOC~~ SOLN
0.0000 [IU] | Freq: Three times a day (TID) | SUBCUTANEOUS | Status: DC
  Administered 2017-11-17: 3 [IU] via SUBCUTANEOUS
  Administered 2017-11-17: 9 [IU] via SUBCUTANEOUS
  Administered 2017-11-17: 7 [IU] via SUBCUTANEOUS

## 2017-11-17 MED ORDER — AMOXICILLIN-POT CLAVULANATE 875-125 MG PO TABS
1.0000 | ORAL_TABLET | Freq: Two times a day (BID) | ORAL | Status: DC
  Administered 2017-11-17: 1 via ORAL
  Filled 2017-11-17: qty 1

## 2017-11-17 MED ORDER — INSULIN ASPART PROT & ASPART (70-30 MIX) 100 UNIT/ML ~~LOC~~ SUSP
20.0000 [IU] | Freq: Every day | SUBCUTANEOUS | Status: DC
  Filled 2017-11-17: qty 10

## 2017-11-17 MED ORDER — DOXYCYCLINE HYCLATE 100 MG PO TABS
100.0000 mg | ORAL_TABLET | Freq: Two times a day (BID) | ORAL | Status: DC
  Administered 2017-11-17: 100 mg via ORAL
  Filled 2017-11-17: qty 1

## 2017-11-17 MED ORDER — DOXYCYCLINE HYCLATE 100 MG PO TABS: 100.0000 mg | ORAL_TABLET | Freq: Two times a day (BID) | ORAL | 0 refills | Status: AC

## 2017-11-17 MED ORDER — INSULIN ASPART PROT & ASPART (70-30 MIX) 100 UNIT/ML ~~LOC~~ SUSP
10.0000 [IU] | Freq: Every day | SUBCUTANEOUS | Status: DC
  Administered 2017-11-17: 10 [IU] via SUBCUTANEOUS
  Filled 2017-11-17: qty 10

## 2017-11-17 MED ORDER — GABAPENTIN 100 MG PO CAPS
100.0000 mg | ORAL_CAPSULE | Freq: Every day | ORAL | Status: DC
  Administered 2017-11-17: 100 mg via ORAL
  Filled 2017-11-17: qty 1

## 2017-11-17 MED ORDER — LIVING WELL WITH DIABETES BOOK - IN SPANISH
Freq: Once | Status: AC
  Administered 2017-11-17: 16:00:00
  Filled 2017-11-17: qty 1

## 2017-11-17 MED FILL — AMOX-CLAV 875-125 MG TABLET: 875-125 | 14 days supply | Qty: 28 | Fill #0

## 2017-11-17 MED FILL — NOVOLOG MIX 70/30 VIAL: (70-30) 100 | 28 days supply | Qty: 10 | Fill #0

## 2017-11-17 MED FILL — GABAPENTIN 100 MG CAPSULE: 100 | 30 days supply | Qty: 30 | Fill #0

## 2017-11-17 MED FILL — DOXYCYCLINE HYCLATE 100 MG: 100 | 10 days supply | Qty: 20 | Fill #0

## 2017-11-17 MED FILL — metFORMIN HCL 1000 MG TABS: 1000 | 30 days supply | Qty: 60 | Fill #0

## 2017-11-17 MED FILL — ULTICARE INS 0.3 ML 30GX1/2: 30G X 1/2" | 30 days supply | Qty: 60 | Fill #0

## 2017-11-17 MED FILL — ACETAMINOPHEN 325 MG TABS: 325 | 4 days supply | Qty: 30 | Fill #0

## 2017-11-17 NOTE — Discharge Instructions (Signed)
°  Please continue taking BOTH antibiotics as prescribe until you see Dr. Lajoyce Corners for follow up.   Your insulin was switched to 70/30 so you can afford this easier. It will be 20 units before breakfast and 10 before dinner.  Please follow up with your PCP on 11/1 to discuss your diabetes regimen and to check in your foot.   We referred you to hematology (blood specialist) to see you for low blood counts- you will be called to make this appointment.

## 2017-11-17 NOTE — Care Management Note (Signed)
Case Management Note  Patient Details  Name: Nyeem Stoke MRN: [id number] Date of Birth: Nov 16, 1975  Subjective/Objective: 43 yr old gentleman admitted with recurrent cellulitis of right foot.                 Action/Plan: Case manager provided [facility] program for Amoxicillin 875-125mg  and doxycycline 100mg . No further CM assistance needed.    Expected Discharge Date:  11/17/17               Expected Discharge Plan:  Home/Self Care  In-House Referral:  NA  Discharge planning Services  CM Consult, MATCH Program, Medication Assistance  Post Acute Care Choice:  NA Choice offered to:  NA  DME Arranged:  N/A DME Agency:  NA  HH Arranged:  NA HH Agency:  NA  Status of Service:  Completed, signed off  If discussed at Long Length of Stay Meetings, dates discussed:    Additional Comments:  Durenda Guthrie, RN 11/17/2017, 2:39 PM

## 2017-11-17 NOTE — Progress Notes (Addendum)
Family Medicine Teaching Service Daily Progress Note Intern Pager: [phone]  Patient name: Mitchell [name] Medical record number: [id number] Date of birth: 17-Apr-1975 Age: 42 y.o. Gender: male  Primary Care Provider: Howard Pouch, MD Consultants: ortho surg Code Status: full  Pt Overview and Major Events to Date:  10/23 admitted  Assessment and Plan:  Cellulitis R foot: patient state's he has poorly controlled pain in his foot rated as 7 or 8 out of 10. He seems to be resting comfortably. His foot was acutely tender and he withdrew at a light touch to his foot, leg and knee. Thigh had minimal tenderness to palpation per patient. He states that it is improved from yesterday. Given the nature of his pain and his history of poorly controlled diabetes, I prescribed low dose gabapentin and will recheck pain level to increase or change meds as needed (was taking 100 TID at home with poor adherence). Lactic acid 1.1 today. WBC count 2.8 foot xray neg for osteomyelitis signs MRI scheduled. Switching to doxycycline today. - f/u ortho recs - f/u R foot MRI - f/u blood Cx - Vancomycin and Zosyn (10/23-24) - Doxycycline (10/24-) - Zofran PRN for nausea - Tylenol PRN for pain  Insulin-dependant diabetes mellitus: Chronic with complications.  Poorly controlled, last A1c 13.4 approximately 2 weeks ago.  CBGs 244 this am. Given 15 units lantus and 3 units novolog. - restart metformin 1000mg  BID - restart one dose home gabapentin 100mg  - repeat BMP am  GERD: Chronic.  Controlled.  Not currently on PPI. - Continue monitoring  Alcohol dependence with h/o mood disorder and intentional overdose: Patient reports being sober for approximately 7 years. - Continue monitoring and consider CIWA if withdrawal symptoms present - continue home Cymbalta 30mg  daily  Pancytopenia: Chronic.  Platelet 90 K on admission which is at patient's baseline.  Platelets 82 today. WBC 2.8, RBC 4.04, Hgb 12.4 MCV 86.9  Patient has history of leukemia and anemia.Marland Kitchen - continue to monitor CBC daily  FEN/GI: carb modified diet Prophylaxis: Lovenox  Disposition: Continue IV antibiotics pending orthopedic consult and evaluation for right foot cellulitis and possible osteomyelitis.  Subjective:  Overnight: no acute events  Today: patient complains of constant pain in entire right leg 7-8/10. States ortho already came to see him today. He is scheduled to get MRI of right foot today. States pain is improved from yesterday. He has no other concerns or questions at this time. Will continue to monitor carefully for subtle signs of infection given pancytopenia may be masking overt infectious signs.  Objective: Temp:  [97.8 F (36.6 C)-98.8 F (37.1 C)] 97.9 F (36.6 C) (10/24 0221) Pulse Rate:  [57-81] 68 (10/24 0221) Resp:  [12-20] 16 (10/24 0221) BP: (103-148)/(67-97) 111/88 (10/24 0221) SpO2:  [96 %-100 %] 100 % (10/24 0221)   Physical Exam: General: resting comfortably, NAD Cardiovascular: RRR, no murmur appreciated, possible intermittent S3? Respiratory: CTAB, no wheezing or increased WOB Abdomen: soft, non-distended, non-tender Extremities: left leg no abnormalities, right leg tender to light palpation diffusely with worsening distally.  Has obvious right foot deformity with digit amputation and scarring. No purulent drainage this morning and no significant erythema or swelling to proximal limb. Appearance is improved from clinical images in H&P.  Laboratory: Recent Labs  Lab 11/16/17 1443 11/17/17 0205  WBC 3.3* 2.8*  HGB 14.4 12.4*  HCT 42.3 35.1*  PLT 90* 82*   Recent Labs  Lab 11/16/17 1443 11/16/17 2216 11/17/17 0205  NA 129* 134* 138  K 3.9  3.4* 3.0*  CL 98 105 110  CO2 24 22 23   BUN 10 11 10   CREATININE 0.87 0.72 0.59*  CALCIUM 8.5* 8.0* 8.1*  PROT 7.3 6.6 6.0*  BILITOT 1.2 0.8 1.0  ALKPHOS 111 103 85  ALT 24 21 20   AST 27 24 20   GLUCOSE 577* 433* 136*   Glucose 244 LA  1.1  Imaging/Diagnostic Tests: Dg Foot Complete Right  Result Date: 11/16/2017 CLINICAL DATA:  Diabetic patient with open wounds EXAM: RIGHT FOOT COMPLETE - 3+ VIEW COMPARISON:  MRI 02/26/2017, radiographs 02/26/2017 FINDINGS: Diffuse osteopenia. Possible old fracture deformity at the distal tibia. Prior amputation of the first ray at the TMT joint. No fracture. No periostitis or bone destruction. Fusion across the TMT joints and tarsal bones. Ulcer adjacent to base of fifth metatarsal without soft tissue emphysema. IMPRESSION: Prior amputation of first digit. No definite acute osseous abnormality. Other chronic findings as described above. Electronically Signed   By: Jasmine Pang M.D.   On: 11/16/2017 15:31    Leeroy Bock, DO 11/17/2017, 7:29 AM PGY-1, [place] [facility] FPTS Intern pager: [phone], text pages welcome

## 2017-11-17 NOTE — Progress Notes (Signed)
Patient discharging home. Pharmacy delivered patients medicine. Discharge instructions explained to patient including teaching on administering insulin, with return demonstration. Patient verbalized understanding. Took all personal belongings. No further questions or concerns voiced.

## 2017-11-17 NOTE — Progress Notes (Addendum)
Inpatient Diabetes Program Recommendations  AACE/ADA: New Consensus Statement on Inpatient Glycemic Control (2015)  Target Ranges:  Prepandial:   less than 140 mg/dL      Peak postprandial:   less than 180 mg/dL (1-2 hours)      Critically ill patients:  140 - 180 mg/dL   Review of Glycemic Control  Diabetes history: DM 2 Outpatient Diabetes medications: Lantus 55 units, Metformin 1000 mg BID Current orders for Inpatient glycemic control: Lantus 15 units, Novolog 0-9 units tid  Inpatient Diabetes Program Recommendations:    Patient's IV insulin gtt rate was at least 2.7 units/hour. Consider increasing patients basal insulin, Lantus 35 units at least. Consider increasing correction to Novolog 0-15 units tid and add Novolog HS coverage.  A1c 13.4% on 11/02/2017 at PCP office, however patient had active infection at that time as well.  Patient counseled at that visit and had not been checking glucose and missed Novolog doses.  Will check in with patient today to see if he has changed DM management since MD visit. Noted glucose on presentation 577.  Thanks,  Christena Deem RN, MSN, BC-ADM Inpatient Diabetes Coordinator Team Pager [phone] (8a-5p)  1430 pm:  Spoke with patient in room. Patient reports taking medications and eating a balanced diet at home. Patient reports checking glucose and is still seeing glucose range in the 300-500 level. Discussed the need to get glucose down. Told patient we will watch trends and see what his body needs to control glucose levels.  One barrier is that patient works at night and schedule is off sometimes.
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
## 2017-11-17 NOTE — Discharge Summary (Signed)
Family Medicine Teaching [facility] Discharge Summary  Patient name: Mitchell [name] Medical record number: [id number] Date of birth: 1975-04-04 Age: 42 y.o. Gender: male Date of Admission: 11/16/2017  Date of Discharge: 11/17/2017 Admitting Physician: Westley Chandler, MD  Primary Care Provider: Howard Pouch, MD Consultants: ortho, diabetes education  Indication for Hospitalization: cellulitis  Discharge Diagnoses/Problem List:  Cellulitis R foot Insulin dependent Diabetes Mellitus GERD Pancytopenia H/o leukemia  Disposition: home  Discharge Condition: stable  Discharge Exam: (from progress note on day of discharge) General: resting comfortably, NAD Cardiovascular: RRR, no murmur appreciated, possible intermittent S3? Respiratory: CTAB, no wheezing or increased WOB Abdomen: soft, non-distended, non-tender Extremities: left leg no abnormalities, right leg tender to light palpation diffusely with worsening distally.  Has obvious right foot deformity with digit amputation and scarring. No purulent drainage this morning and no significant erythema or swelling to proximal limb. Appearance is improved from clinical images in H&P.  Brief Hospital Course:  Patient admitted for recurrent worsening pain and drainage from right foot consistent with soft tissue cellulitis. Xray and MRI showed no signs of osteomyelitis. Ortho surgery consulted and recommended medical management with follow up outpatient. Patient received IV antibiotics for 2 days and was discharged with doxycycline and augmentin. He remained stable throughout his admission and had improvement in perceived pain from foot. Due to history of poorly controlled diabetes (A1c >13), patient was started on new regimen of 70/30 insulin 20units am, 10units pm. Patient was educated by pharmacy on proper administration technique and provided with his insulin, supplies, metformin, doxycycline, and [facility]in [facility].  He was also found to have pancytopenia while admitted with a h/o leukemia so was discharged with ambulatory heme/onc referral.   Issues for Follow Up:  1. Ensure patient has followed up with orthodepics, Dr. Lajoyce Corners, to monitor cellulitis R foot 2. Ensure patient has obtained and is taking new insulin regimen of 70/30, 20 units every morning and 10 units every night as well as metformin 1000mg  BID with history of non adherence 2/2 affordability of medications. (A1c 13.4 10/2017) 3. Ensure patient has follow up with hematology for pancytopenia on admission and hx of leukemia  Significant Procedures: none  Significant Labs and Imaging:  Recent Labs  Lab 11/16/17 1443 11/17/17 0205  WBC 3.3* 2.8*  HGB 14.4 12.4*  HCT 42.3 35.1*  PLT 90* 82*   Recent Labs  Lab 11/16/17 1443 11/16/17 2216 11/17/17 0205  NA 129* 134* 138  K 3.9 3.4* 3.0*  CL 98 105 110  CO2 24 22 23   GLUCOSE 577* 433* 136*  BUN 10 11 10   CREATININE 0.87 0.72 0.59*  CALCIUM 8.5* 8.0* 8.1*  ALKPHOS 111 103 85  AST 27 24 20   ALT 24 21 20   ALBUMIN 3.5 3.1* 2.8*   Lactic Acid, Venous    Component Value Date/Time   LATICACIDVEN 1.1 11/17/2017 0865    Mr Foot Right W Wo Contrast  Result Date: 11/17/2017 CLINICAL DATA:  Recurrent right foot cellulitis. History of prior amputation. EXAM: MRI OF THE RIGHT FOREFOOT WITHOUT AND WITH CONTRAST TECHNIQUE: Multiplanar, multisequence MR imaging of the right forefoot was performed before and after the administration of intravenous contrast. CONTRAST:  6 cc Gadavist IV. COMPARISON:  Plain films right foot 11/16/2017.  MRI 02/26/2017. FINDINGS: Bones/Joint/Cartilage Amputation of the first ray and solid osseous fusion of the bones of the midfoot and second and third tarsometatarsal joints are again seen. Mild subchondral edema at the superior aspect  of the calcaneocuboid joint has an appearance most consistent with degenerative disease rather than  osteomyelitis. No marrow edema or enhancement to suggest osteomyelitis is identified. Ligaments Negative. Muscles and Tendons There is atrophy of intrinsic musculature the foot. Soft tissues Soft tissue edema is present about the foot diffusely. There is an ulceration on the plantar surface of the foot but no underlying abscess is identified. There is no evidence of septic joint. IMPRESSION: Findings compatible with cellulitis about the foot without abscess, osteomyelitis or septic joint. Skin ulceration on the plantar surface of the foot noted. Status post amputation of the first ray and fusion of the bones the midfoot, unchanged. Moderate osteoarthritis calcaneocuboid joint. Electronically Signed   By: Drusilla Kanner M.D.   On: 11/17/2017 10:30   Dg Foot Complete Right  Result Date: 11/16/2017 CLINICAL DATA:  Diabetic patient with open wounds EXAM: RIGHT FOOT COMPLETE - 3+ VIEW COMPARISON:  MRI 02/26/2017, radiographs 02/26/2017 FINDINGS: Diffuse osteopenia. Possible old fracture deformity at the distal tibia. Prior amputation of the first ray at the TMT joint. No fracture. No periostitis or bone destruction. Fusion across the TMT joints and tarsal bones. Ulcer adjacent to base of fifth metatarsal without soft tissue emphysema. IMPRESSION: Prior amputation of first digit. No definite acute osseous abnormality. Other chronic findings as described above. Electronically Signed   By: Jasmine Pang M.D.   On: 11/16/2017 15:31     Results/Tests Pending at Time of Discharge: none  Discharge Medications:  Allergies as of 11/17/2017   No Known Allergies     Medication List    STOP taking these medications   feeding supplement (GLUCERNA SHAKE) Liqd   insulin aspart 100 UNIT/ML injection Commonly known as:  novoLOG   insulin glargine 100 UNIT/ML injection Commonly known as:  LANTUS     TAKE these medications   acetaminophen 325 MG tablet Commonly known as:  TYLENOL Take 2 tablets (650 mg  total) by mouth every 6 (six) hours as needed for mild pain (or Fever >/= 101).   amoxicillin-clavulanate 875-125 MG tablet Commonly known as:  AUGMENTIN Take 1 tablet by mouth every 12 (twelve) hours for 14 days.   doxycycline 100 MG tablet Commonly known as:  VIBRA-TABS Take 1 tablet (100 mg total) by mouth every 12 (twelve) hours for 10 days.   DULoxetine 60 MG capsule Commonly known as:  CYMBALTA Take 1 capsule (60 mg total) by mouth daily.   gabapentin 100 MG capsule Commonly known as:  NEURONTIN Take 1 capsule (100 mg total) by mouth daily. Start taking on:  11/18/2017 What changed:  when to take this   insulin aspart protamine- aspart (70-30) 100 UNIT/ML injection Commonly known as:  NOVOLOG MIX 70/30 Inject 0.1 mLs (10 Units total) into the skin daily with supper.   insulin aspart protamine- aspart (70-30) 100 UNIT/ML injection Commonly known as:  NOVOLOG MIX 70/30 Inject 0.2 mLs (20 Units total) into the skin daily with breakfast. Start taking on:  11/18/2017   lisinopril 5 MG tablet Commonly known as:  PRINIVIL,ZESTRIL Take 0.5 tablets (2.5 mg total) by mouth daily.   metFORMIN 1000 MG tablet Commonly known as:  GLUCOPHAGE Take 1 tablet (1,000 mg total) by mouth 2 (two) times daily with a meal. What changed:  Another medication with the same name was added. Make sure you understand how and when to take each.   metFORMIN 1000 MG tablet Commonly known as:  GLUCOPHAGE Take 1 tablet (1,000 mg total) by mouth 2 (two)  times daily with a meal. What changed:  You were already taking a medication with the same name, and this prescription was added. Make sure you understand how and when to take each.   multivitamin with minerals Tabs tablet Take 1 tablet by mouth daily. For for low vitamin   nitroGLYCERIN 0.4 MG SL tablet Commonly known as:  NITROSTAT Place 1 tablet (0.4 mg total) under the tongue every 5 (five) minutes as needed for chest pain.      Discharge  Instructions: Please refer to Patient Instructions section of EMR for full details.  Patient was counseled important signs and symptoms that should prompt return to medical care, changes in medications, dietary instructions, activity restrictions, and follow up appointments.   Follow-Up Appointments: Follow-up Information    Nadara Mustard, MD Follow up in 1 week(s).   Specialty:  Orthopedic Surgery Contact information: [address] [place] [address] [phone]        Shirley, [place], DO. Go on 11/25/2017.   Specialty:  Family Medicine Why:  at 2pm Contact information: [address] [place] [address] [phone]           Leeroy Bock, DO 11/17/2017, 1:59 PM PGY-1, [facility]

## 2017-11-17 NOTE — Plan of Care (Signed)
  Problem: Education: Goal: Ability to describe self-care measures that may prevent or decrease complications (Diabetes Survival Skills Education) will improve Outcome: Progressing   Problem: Activity: Goal: Risk for activity intolerance will decrease Outcome: Progressing   Problem: Pain Managment: Goal: General experience of comfort will improve Outcome: Progressing   Problem: Safety: Goal: Ability to remain free from injury will improve Outcome: Progressing   Problem: Skin Integrity: Goal: Risk for impaired skin integrity will decrease Outcome: Progressing   

## 2017-11-21 LAB — CULTURE, BLOOD (ROUTINE X 2)
Culture: NO GROWTH
Culture: NO GROWTH
Special Requests: ADEQUATE

## 2017-11-25 ENCOUNTER — Ambulatory Visit (INDEPENDENT_AMBULATORY_CARE_PROVIDER_SITE_OTHER): Payer: Self-pay | Admitting: Family Medicine

## 2017-11-25 ENCOUNTER — Encounter: Payer: Self-pay | Admitting: Family Medicine

## 2017-11-25 ENCOUNTER — Other Ambulatory Visit: Payer: Self-pay

## 2017-11-25 VITALS — BP 132/70 | HR 71 | Temp 98.3°F | Ht 64.0 in | Wt 149.0 lb

## 2017-11-25 DIAGNOSIS — E118 Type 2 diabetes mellitus with unspecified complications: Secondary | ICD-10-CM

## 2017-11-25 DIAGNOSIS — D61818 Other pancytopenia: Secondary | ICD-10-CM

## 2017-11-25 MED ORDER — NAPROXEN 375 MG PO TABS: 375.0000 mg | ORAL_TABLET | Freq: Two times a day (BID) | ORAL | 0 refills | Status: DC

## 2017-11-25 NOTE — Progress Notes (Signed)
Subjective:    Patient ID: Mitchell [name], male    DOB: 09/24/1975, 42 y.o.   MRN: [id number]  Spanish interpreter used with iPad CC: hospital f/u  HPI: Hospital follow-up for sepsis secondary to diabetic ulcer of right foot Patient reports that he is feeling better after being discharged from hospital.  Patient reports that he has been checking his CBGs once in the morning.  Levels have run between 267 and 346.  Patient reports that he works night shift but he has been checking his blood sugar when he gets home around 6 AM.  -Patient has also been taking NPH 20 units in the morning around 6 AM and 10 units at night around 9:52 PM.  He is taking his larger dose after getting home from work and going to bed.  -Patient reports that he has had some dizziness but this is improving. ROS: Patient denies fevers, chills, diaphoresis, LOC, polyuria, polydipsia -Patient reports that D he has follow-up with Dr. due to for his leg. -Patient has not heard from hematology regarding referral made while he was hospitalized.   Smoking status reviewed  ROS: 10 point ROS is otherwise negative, except as mentioned in HPI  Patient Active Problem List   Diagnosis Date Noted  . Pancytopenia (HCC) 11/17/2017  . Medication course changed   . Diabetes education, encounter for   . Cellulitis of right foot 11/16/2017  . Hyperglycemia   . Sepsis (HCC) 02/26/2017  . Fever   . Uncontrolled type 2 diabetes mellitus with hyperglycemia (HCC)   . Hyponatremia   . Thrombocytopenia (HCC) 01/22/2016  . Diabetic foot ulcer (HCC)   . Anemia, unspecified 07/29/2012  . Cellulitis and abscess of toe of right foot 07/13/2012  . Alcohol dependence (HCC) 04/29/2012    Class: Chronic  . Drug overdose, intentional (HCC) 04/25/2012  . Depression 12/15/2011  . Diabetes mellitus type 2 with complications (HCC) 05/07/2011  . Substance induced mood disorder (HCC) 04/15/2011    Class: Acute  . GERD (gastroesophageal reflux  disease)      Objective:  BP 132/70   Pulse 71   Temp 98.3 F (36.8 C) (Oral)   Ht 5\' 4"  (1.626 m)   Wt 149 lb (67.6 kg)   SpO2 97%   BMI 25.58 kg/m  Vitals and nursing note reviewed  General: NAD, pleasant Respiratory: normal effort Abdomen: soft, nontender, nondistended Extremities: left leg no abnormalities, right leg tender to light palpation diffusely with worsening distally.  Has obvious right foot deformity with digit amputation and scarring. No purulent drainage this morning and no significant erythema or swelling to proximal limb. Skin: warm and dry, no rashes noted Neuro: alert and oriented, no focal deficits Psych: normal affect  Assessment & Plan:    Diabetes mellitus type 2 with complications (HCC) Patient is to begin taking 20 units of NPH prior to going to work with his night shift.  He has been to take 10 units when he gets home from work.  Patient encouraged to check blood sugar after waking up from his longest sleep and before eating a meal.  Patient also educated on hypoglycemia protocol.  Patient voiced understanding and was able to use teach back method.  Patient given strict return precautions for worsening of his foot ulcer.  Patient to follow up Dr. Lajoyce Corners for you foot next week  Patient to follow up with Dr. Raymondo Band on November 11 at 10:30am.   Pancytopenia [facility]) Referral placed for hematology and patient to schedule an  appointment when they make a call.  Patient to let us know if he does not hear from them soon.    [place] Dollie Bressi, DO Family Medicine Resident PGY-2

## 2017-11-25 NOTE — Patient Instructions (Addendum)
Thank you for coming to see me today. It was a pleasure! Today we talked about:   Please take the 20 U before you go to work on your night shift. Please take the 10 U when you get home from work. Please check your blood sugar after you wake up from sleep and before you eat a meal. You should also check your blood sugar if you feel like it is low. If it is less than 80, please drink a juice or eat something sweet.   Please follow up Dr. Lajoyce Corners for you foot next week. For your pain continue tylenol with naproxen that I have sent, as they work better together.   Please schedule an appointment with the hematologist, we have placed a referral.   Please follow up with Dr. Raymondo Band on November 11 at 10:30am.   Please follow-up with [place] as needed. Do not hesitate to come in if you develop fevers or your foot worsens.   If you have any questions or concerns, please do not hesitate to call the office at [phone].  Take Care,   [place] Celes Dedic, DO      Diet Recommendations for Diabetes  Carbohydrate includes starch, sugar, and fiber.  Of these, only sugar and starch raise blood glucose.  (Fiber is found in fruits, vegetables [especially skin, seeds, and stalks] and whole grains.)   Starchy (carb) foods: Bread, rice, pasta, potatoes, corn, cereal, grits, crackers, bagels, muffins, all baked goods.  (Fruit, milk, and yogurt also have carbohydrate, but most of these foods will not spike your blood sugar as most starchy foods will.)  A few fruits do cause high blood sugars; use small portions of bananas (limit to 1/2 at a time), grapes, watermelon, oranges, and most tropical fruits.   Protein foods: Meat, fish, poultry, eggs, dairy foods, and beans such as pinto and kidney beans (beans also provide carbohydrate).   1. Eat at least REAL 3 meals and 1-2 snacks per day. Never go more than 4-5 hours while awake without eating. Eat breakfast within the first hour of getting up.   2. Limit starchy foods to TWO  per meal and ONE per snack. ONE portion of a starchy  food is equal to the following:   - ONE slice of bread (or its equivalent, such as half of a hamburger bun).   - 1/2 cup of a "scoopable" starchy food such as potatoes or rice.   - 15 grams of Total Carbohydrate as shown on food label.  3. Include at every meal: a protein food, a carb food, and vegetables and/or fruit.   - Obtain twice the volume of veg's as protein or carbohydrate foods for both lunch and dinner.   - Fresh or frozen veg's are best.   - Keep frozen veg's on hand for a quick vegetable serving.

## 2017-11-26 LAB — BASIC METABOLIC PANEL
BUN/Creatinine Ratio: 11 (ref 9–20)
BUN: 10 mg/dL (ref 6–24)
CALCIUM: 8.8 mg/dL (ref 8.7–10.2)
CHLORIDE: 98 mmol/L (ref 96–106)
CO2: 22 mmol/L (ref 20–29)
Creatinine, Ser: 0.89 mg/dL (ref 0.76–1.27)
GFR calc non Af Amer: 105 mL/min/{1.73_m2} (ref >59)
GFR, EST AFRICAN AMERICAN: 121 mL/min/{1.73_m2} (ref >59)
Glucose: 364 mg/dL — ABNORMAL HIGH (ref 65–99)
POTASSIUM: 3.4 mmol/L — AB (ref 3.5–5.2)
SODIUM: 134 mmol/L (ref 134–144)

## 2017-11-30 ENCOUNTER — Encounter: Payer: Self-pay | Admitting: Family Medicine

## 2017-12-01 NOTE — Assessment & Plan Note (Signed)
Referral placed for hematology and patient to schedule an appointment when they make a call.  Patient to let us know if he does not hear from them soon.

## 2017-12-01 NOTE — Assessment & Plan Note (Signed)
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  He has been to take 10 units when he gets home from work.  Patient encouraged to check blood sugar after waking up from his longest sleep and before eating a meal.  Patient also educated on hypoglycemia protocol.  Patient voiced understanding and was able to use teach back method.  Patient given strict return precautions for worsening of his foot ulcer.  Patient to follow up Dr. Lajoyce Corners for you foot next week  Patient to follow up with Dr. Raymondo Band on November 11 at 10:30am.

## 2017-12-05 ENCOUNTER — Ambulatory Visit: Payer: Self-pay | Admitting: Pharmacist

## 2017-12-06 ENCOUNTER — Ambulatory Visit: Payer: Self-pay | Admitting: Podiatry

## 2017-12-10 ENCOUNTER — Encounter: Payer: Self-pay | Admitting: Family Medicine

## 2017-12-22 ENCOUNTER — Emergency Department (HOSPITAL_COMMUNITY): Payer: Self-pay

## 2017-12-22 ENCOUNTER — Inpatient Hospital Stay (HOSPITAL_COMMUNITY)
Admission: EM | Admit: 2017-12-22 | Discharge: 2017-12-24 | DRG: 603 | Disposition: A | Discharge status: Home / Self Care | Payer: Self-pay | Attending: Family Medicine | Admitting: Family Medicine

## 2017-12-22 ENCOUNTER — Other Ambulatory Visit: Payer: Self-pay

## 2017-12-22 ENCOUNTER — Encounter (HOSPITAL_COMMUNITY): Payer: Self-pay

## 2017-12-22 DIAGNOSIS — E11621 Type 2 diabetes mellitus with foot ulcer: Secondary | ICD-10-CM

## 2017-12-22 DIAGNOSIS — L03115 Cellulitis of right lower limb: Principal | ICD-10-CM | POA: Diagnosis present

## 2017-12-22 DIAGNOSIS — G8929 Other chronic pain: Secondary | ICD-10-CM | POA: Diagnosis present

## 2017-12-22 DIAGNOSIS — Z915 Personal history of self-harm: Secondary | ICD-10-CM

## 2017-12-22 DIAGNOSIS — E11628 Type 2 diabetes mellitus with other skin complications: Secondary | ICD-10-CM | POA: Diagnosis present

## 2017-12-22 DIAGNOSIS — F32A Depression, unspecified: Secondary | ICD-10-CM | POA: Diagnosis present

## 2017-12-22 DIAGNOSIS — K7010 Alcoholic hepatitis without ascites: Secondary | ICD-10-CM | POA: Diagnosis present

## 2017-12-22 DIAGNOSIS — E1142 Type 2 diabetes mellitus with diabetic polyneuropathy: Secondary | ICD-10-CM | POA: Diagnosis present

## 2017-12-22 DIAGNOSIS — B999 Unspecified infectious disease: Secondary | ICD-10-CM

## 2017-12-22 DIAGNOSIS — F329 Major depressive disorder, single episode, unspecified: Secondary | ICD-10-CM | POA: Diagnosis present

## 2017-12-22 DIAGNOSIS — K219 Gastro-esophageal reflux disease without esophagitis: Secondary | ICD-10-CM | POA: Diagnosis present

## 2017-12-22 DIAGNOSIS — E872 Acidosis: Secondary | ICD-10-CM | POA: Diagnosis present

## 2017-12-22 DIAGNOSIS — M8580 Other specified disorders of bone density and structure, unspecified site: Secondary | ICD-10-CM | POA: Diagnosis present

## 2017-12-22 DIAGNOSIS — Z9181 History of falling: Secondary | ICD-10-CM

## 2017-12-22 DIAGNOSIS — D61818 Other pancytopenia: Secondary | ICD-10-CM | POA: Diagnosis present

## 2017-12-22 DIAGNOSIS — L0390 Cellulitis, unspecified: Secondary | ICD-10-CM | POA: Insufficient documentation

## 2017-12-22 DIAGNOSIS — L97519 Non-pressure chronic ulcer of other part of right foot with unspecified severity: Secondary | ICD-10-CM | POA: Diagnosis present

## 2017-12-22 DIAGNOSIS — R739 Hyperglycemia, unspecified: Secondary | ICD-10-CM

## 2017-12-22 DIAGNOSIS — E118 Type 2 diabetes mellitus with unspecified complications: Secondary | ICD-10-CM | POA: Diagnosis present

## 2017-12-22 DIAGNOSIS — Z79899 Other long term (current) drug therapy: Secondary | ICD-10-CM

## 2017-12-22 DIAGNOSIS — Z8739 Personal history of other diseases of the musculoskeletal system and connective tissue: Secondary | ICD-10-CM

## 2017-12-22 DIAGNOSIS — L089 Local infection of the skin and subcutaneous tissue, unspecified: Secondary | ICD-10-CM | POA: Diagnosis present

## 2017-12-22 DIAGNOSIS — D696 Thrombocytopenia, unspecified: Secondary | ICD-10-CM | POA: Diagnosis present

## 2017-12-22 DIAGNOSIS — M216X1 Other acquired deformities of right foot: Secondary | ICD-10-CM | POA: Diagnosis present

## 2017-12-22 DIAGNOSIS — Z794 Long term (current) use of insulin: Secondary | ICD-10-CM

## 2017-12-22 DIAGNOSIS — F1011 Alcohol abuse, in remission: Secondary | ICD-10-CM

## 2017-12-22 DIAGNOSIS — L97501 Non-pressure chronic ulcer of other part of unspecified foot limited to breakdown of skin: Secondary | ICD-10-CM

## 2017-12-22 DIAGNOSIS — E1151 Type 2 diabetes mellitus with diabetic peripheral angiopathy without gangrene: Secondary | ICD-10-CM | POA: Diagnosis present

## 2017-12-22 DIAGNOSIS — Z8719 Personal history of other diseases of the digestive system: Secondary | ICD-10-CM

## 2017-12-22 DIAGNOSIS — E1165 Type 2 diabetes mellitus with hyperglycemia: Secondary | ICD-10-CM | POA: Diagnosis present

## 2017-12-22 DIAGNOSIS — Z8659 Personal history of other mental and behavioral disorders: Secondary | ICD-10-CM

## 2017-12-22 HISTORY — DX: Type 2 diabetes mellitus with foot ulcer: E11.621

## 2017-12-22 HISTORY — DX: Cellulitis, unspecified: L03.90

## 2017-12-22 LAB — COMPREHENSIVE METABOLIC PANEL
ALT: 37 U/L (ref 0–44)
ALT: 32 U/L (ref 0–44)
AST: 39 U/L (ref 15–41)
AST: 29 U/L (ref 15–41)
Albumin: 3.6 g/dL (ref 3.5–5.0)
Albumin: 3.2 g/dL — ABNORMAL LOW (ref 3.5–5.0)
Alkaline Phosphatase: 128 U/L — ABNORMAL HIGH (ref 38–126)
Alkaline Phosphatase: 108 U/L (ref 38–126)
Anion gap: 9 (ref 5–15)
Anion gap: 6 (ref 5–15)
BUN: 12 mg/dL (ref 6–20)
BUN: 10 mg/dL (ref 6–20)
CO2: 22 mmol/L (ref 22–32)
CO2: 25 mmol/L (ref 22–32)
Calcium: 8.8 mg/dL — ABNORMAL LOW (ref 8.9–10.3)
Calcium: 8.2 mg/dL — ABNORMAL LOW (ref 8.9–10.3)
Chloride: 100 mmol/L (ref 98–111)
Chloride: 101 mmol/L (ref 98–111)
Creatinine, Ser: 0.8 mg/dL (ref 0.61–1.24)
Creatinine, Ser: 0.85 mg/dL (ref 0.61–1.24)
GFR calc Af Amer: >60 mL/min (ref >60)
GFR calc Af Amer: >60 mL/min (ref >60)
GFR calc non Af Amer: >60 mL/min (ref >60)
GFR calc non Af Amer: >60 mL/min (ref >60)
Glucose, Bld: 472 mg/dL — ABNORMAL HIGH (ref 70–99)
Glucose, Bld: 432 mg/dL — ABNORMAL HIGH (ref 70–99)
POTASSIUM: 3.5 mmol/L (ref 3.5–5.1)
Potassium: 4 mmol/L (ref 3.5–5.1)
Sodium: 131 mmol/L — ABNORMAL LOW (ref 135–145)
Sodium: 132 mmol/L — ABNORMAL LOW (ref 135–145)
Total Bilirubin: 0.9 mg/dL (ref 0.3–1.2)
Total Bilirubin: 0.9 mg/dL (ref 0.3–1.2)
Total Protein: 7.4 g/dL (ref 6.5–8.1)
Total Protein: 6.5 g/dL (ref 6.5–8.1)

## 2017-12-22 LAB — CBC WITH DIFFERENTIAL/PLATELET
ABS IMMATURE GRANULOCYTES: 0.01 10*3/uL (ref 0.00–0.07)
Abs Immature Granulocytes: 0.01 10*3/uL (ref 0.00–0.07)
Basophils Absolute: 0 10*3/uL (ref 0.0–0.1)
Basophils Absolute: 0 10*3/uL (ref 0.0–0.1)
Basophils Relative: 1 %
Basophils Relative: 0 %
Eosinophils Absolute: 0.1 10*3/uL (ref 0.0–0.5)
Eosinophils Absolute: 0.1 10*3/uL (ref 0.0–0.5)
Eosinophils Relative: 2 %
Eosinophils Relative: 2 %
HCT: 37.8 % — ABNORMAL LOW (ref 39.0–52.0)
HCT: 36 % — ABNORMAL LOW (ref 39.0–52.0)
HEMOGLOBIN: 13.9 g/dL (ref 13.0–17.0)
Hemoglobin: 12.4 g/dL — ABNORMAL LOW (ref 13.0–17.0)
Immature Granulocytes: 0 %
Immature Granulocytes: 0 %
LYMPHS ABS: 0.8 10*3/uL (ref 0.7–4.0)
LYMPHS PCT: 25 %
Lymphocytes Relative: 33 %
Lymphs Abs: 0.9 10*3/uL (ref 0.7–4.0)
MCH: 29.7 pg (ref 26.0–34.0)
MCH: 31.2 pg (ref 26.0–34.0)
MCHC: 36.8 g/dL — ABNORMAL HIGH (ref 30.0–36.0)
MCHC: 34.4 g/dL (ref 30.0–36.0)
MCV: 84.9 fL (ref 80.0–100.0)
MCV: 86.1 fL (ref 80.0–100.0)
MONO ABS: 0.3 10*3/uL (ref 0.1–1.0)
Monocytes Absolute: 0.3 10*3/uL (ref 0.1–1.0)
Monocytes Relative: 9 %
Monocytes Relative: 10 %
NEUTROS ABS: 2 10*3/uL (ref 1.7–7.7)
NRBC: 0 % (ref 0.0–0.2)
Neutro Abs: 1.4 10*3/uL — ABNORMAL LOW (ref 1.7–7.7)
Neutrophils Relative %: 63 %
Neutrophils Relative %: 55 %
Platelets: 104 10*3/uL — ABNORMAL LOW (ref 150–400)
Platelets: 82 10*3/uL — ABNORMAL LOW (ref 150–400)
RBC: 4.45 MIL/uL (ref 4.22–5.81)
RBC: 4.18 MIL/uL — ABNORMAL LOW (ref 4.22–5.81)
RDW: 13.2 % (ref 11.5–15.5)
RDW: 13.5 % (ref 11.5–15.5)
WBC: 3.1 10*3/uL — ABNORMAL LOW (ref 4.0–10.5)
WBC: 2.6 10*3/uL — ABNORMAL LOW (ref 4.0–10.5)
nRBC: 0 % (ref 0.0–0.2)

## 2017-12-22 LAB — HEMOGLOBIN A1C
HEMOGLOBIN A1C: 10.7 % — AB (ref 4.8–5.6)
Mean Plasma Glucose: 260.39 mg/dL

## 2017-12-22 LAB — GLUCOSE, CAPILLARY
GLUCOSE-CAPILLARY: 408 mg/dL — AB (ref 70–99)
GLUCOSE-CAPILLARY: 322 mg/dL — AB (ref 70–99)
Glucose-Capillary: 256 mg/dL — ABNORMAL HIGH (ref 70–99)
Glucose-Capillary: 262 mg/dL — ABNORMAL HIGH (ref 70–99)
Glucose-Capillary: 398 mg/dL — ABNORMAL HIGH (ref 70–99)

## 2017-12-22 LAB — I-STAT CG4 LACTIC ACID, ED
LACTIC ACID, VENOUS: 2.77 mmol/L — AB (ref 0.5–1.9)
LACTIC ACID, VENOUS: 0.93 mmol/L (ref 0.5–1.9)

## 2017-12-22 MED ORDER — INSULIN ASPART 100 UNIT/ML ~~LOC~~ SOLN
0.0000 [IU] | Freq: Three times a day (TID) | SUBCUTANEOUS | Status: DC
  Administered 2017-12-22: 7 [IU] via SUBCUTANEOUS
  Administered 2017-12-22: 9 [IU] via SUBCUTANEOUS
  Administered 2017-12-22: 5 [IU] via SUBCUTANEOUS
  Administered 2017-12-23: 9 [IU] via SUBCUTANEOUS
  Administered 2017-12-23: 5 [IU] via SUBCUTANEOUS
  Administered 2017-12-23: 3 [IU] via SUBCUTANEOUS
  Administered 2017-12-24: 5 [IU] via SUBCUTANEOUS
  Administered 2017-12-24: 3 [IU] via SUBCUTANEOUS
  Administered 2017-12-24: 5 [IU] via SUBCUTANEOUS

## 2017-12-22 MED ORDER — INSULIN ASPART 100 UNIT/ML ~~LOC~~ SOLN
0.0000 [IU] | Freq: Every day | SUBCUTANEOUS | Status: DC
  Administered 2017-12-22: 5 [IU] via SUBCUTANEOUS
  Administered 2017-12-23: 3 [IU] via SUBCUTANEOUS

## 2017-12-22 MED ORDER — VITAMIN B-1 100 MG PO TABS
100.0000 mg | ORAL_TABLET | Freq: Every day | ORAL | Status: DC
  Administered 2017-12-22 – 2017-12-24 (×3): 100 mg via ORAL
  Filled 2017-12-22 (×3): qty 1

## 2017-12-22 MED ORDER — INSULIN GLARGINE 100 UNIT/ML ~~LOC~~ SOLN
12.0000 [IU] | Freq: Every day | SUBCUTANEOUS | Status: DC
  Administered 2017-12-22: 12 [IU] via SUBCUTANEOUS
  Filled 2017-12-22 (×2): qty 0.12

## 2017-12-22 MED ORDER — SODIUM CHLORIDE 0.9 % IV BOLUS
1000.0000 mL | Freq: Once | INTRAVENOUS | Status: AC
  Administered 2017-12-22: 1000 mL via INTRAVENOUS

## 2017-12-22 MED ORDER — PIPERACILLIN-TAZOBACTAM 3.375 G IVPB 30 MIN
3.3750 g | Freq: Once | INTRAVENOUS | Status: AC
  Administered 2017-12-22: 3.375 g via INTRAVENOUS
  Filled 2017-12-22: qty 50

## 2017-12-22 MED ORDER — ONDANSETRON HCL 4 MG/2ML IJ SOLN: 4.0000 mg | Freq: Four times a day (QID) | INTRAMUSCULAR | Status: DC | PRN

## 2017-12-22 MED ORDER — FOLIC ACID 1 MG PO TABS
1.0000 mg | ORAL_TABLET | Freq: Every day | ORAL | Status: DC
  Administered 2017-12-22 – 2017-12-24 (×3): 1 mg via ORAL
  Filled 2017-12-22 (×3): qty 1

## 2017-12-22 MED ORDER — ACETAMINOPHEN 650 MG RE SUPP: 650.0000 mg | Freq: Four times a day (QID) | RECTAL | Status: DC | PRN

## 2017-12-22 MED ORDER — ADULT MULTIVITAMIN W/MINERALS CH
1.0000 | ORAL_TABLET | Freq: Every day | ORAL | Status: DC
  Administered 2017-12-22 – 2017-12-24 (×3): 1 via ORAL
  Filled 2017-12-22 (×3): qty 1

## 2017-12-22 MED ORDER — ACETAMINOPHEN 325 MG PO TABS
650.0000 mg | ORAL_TABLET | Freq: Four times a day (QID) | ORAL | Status: DC | PRN
  Administered 2017-12-22 – 2017-12-23 (×3): 650 mg via ORAL
  Filled 2017-12-22 (×3): qty 2

## 2017-12-22 MED ORDER — TRAMADOL HCL 50 MG PO TABS
50.0000 mg | ORAL_TABLET | Freq: Four times a day (QID) | ORAL | Status: DC | PRN
  Administered 2017-12-22 – 2017-12-23 (×3): 50 mg via ORAL
  Filled 2017-12-22 (×4): qty 1

## 2017-12-22 MED ORDER — ONDANSETRON HCL 4 MG PO TABS: 4.0000 mg | ORAL_TABLET | Freq: Four times a day (QID) | ORAL | Status: DC | PRN

## 2017-12-22 MED ORDER — VANCOMYCIN HCL 10 G IV SOLR
1250.0000 mg | Freq: Once | INTRAVENOUS | Status: AC
  Administered 2017-12-22: 1250 mg via INTRAVENOUS
  Filled 2017-12-22: qty 1250

## 2017-12-22 MED ORDER — VANCOMYCIN HCL IN DEXTROSE 1-5 GM/200ML-% IV SOLN
1000.0000 mg | Freq: Two times a day (BID) | INTRAVENOUS | Status: DC
  Administered 2017-12-23: 1000 mg via INTRAVENOUS
  Filled 2017-12-22: qty 200

## 2017-12-22 MED ORDER — LORAZEPAM 2 MG/ML IJ SOLN
1.0000 mg | Freq: Four times a day (QID) | INTRAMUSCULAR | Status: DC | PRN
  Administered 2017-12-23: 1 mg via INTRAVENOUS
  Filled 2017-12-22: qty 1

## 2017-12-22 MED ORDER — LORAZEPAM 1 MG PO TABS: 1.0000 mg | ORAL_TABLET | Freq: Four times a day (QID) | ORAL | Status: DC | PRN

## 2017-12-22 MED ORDER — OXYCODONE-ACETAMINOPHEN 5-325 MG PO TABS
1.0000 | ORAL_TABLET | Freq: Once | ORAL | Status: AC
  Administered 2017-12-22: 1 via ORAL
  Filled 2017-12-22: qty 1

## 2017-12-22 MED ORDER — THIAMINE HCL 100 MG/ML IJ SOLN: 100.0000 mg | Freq: Every day | INTRAMUSCULAR | Status: DC

## 2017-12-22 MED ORDER — VANCOMYCIN HCL IN DEXTROSE 1-5 GM/200ML-% IV SOLN
1000.0000 mg | Freq: Once | INTRAVENOUS | Status: AC
  Administered 2017-12-22: 1000 mg via INTRAVENOUS
  Filled 2017-12-22: qty 200

## 2017-12-22 MED ORDER — GABAPENTIN 100 MG PO CAPS
100.0000 mg | ORAL_CAPSULE | Freq: Three times a day (TID) | ORAL | Status: DC
  Administered 2017-12-22 – 2017-12-23 (×4): 100 mg via ORAL
  Filled 2017-12-22 (×4): qty 1

## 2017-12-22 MED ORDER — ENOXAPARIN SODIUM 40 MG/0.4ML ~~LOC~~ SOLN
40.0000 mg | Freq: Every day | SUBCUTANEOUS | Status: DC
  Administered 2017-12-22 – 2017-12-24 (×3): 40 mg via SUBCUTANEOUS
  Filled 2017-12-22 (×3): qty 0.4

## 2017-12-22 NOTE — ED Notes (Signed)
Shari-PA is aware of the elevated CG-4 of 2.77

## 2017-12-22 NOTE — Progress Notes (Signed)
Pt  Blood sugar 408 this morning at 0627, 9 unit of insulin Novolog given, will continue to monitor.

## 2017-12-22 NOTE — ED Triage Notes (Signed)
Pt states that he has a diabetic foot ulcer for the past month on his R top part of foot, worse over the last two days, had a fever on Sunday

## 2017-12-22 NOTE — Progress Notes (Addendum)
Pharmacy Antibiotic Note  Mitchell [name] is a 42 y.o. male admitted on 12/22/2017 with cellulitis.  Pharmacy has been consulted for vancomycin dosing. 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  Patient received once dose of vancomycin 1000mg  and Zosyn in the ED 11/28@0200 .  Plan: Vancomycin 1250 mg  IV once followed by  Vancomycin 1000 mg IV every 12 hours. Goal trough 10-1715mcg/mL.    Monitor renal function, trough at steady state, clinical status.    Height: 5' 1.81" (157 cm) Weight: 146 lb 8 oz (66.5 kg) IBW/kg (Calculated) : 54.17  Temp (24hrs), Avg:97.7 F (36.5 C), Min:97.7 F (36.5 C), Max:97.7 F (36.5 C)  Recent Labs  Lab 12/22/17 0050 12/22/17 0103 12/22/17 0307 12/22/17 0612  WBC 3.1*  --   --  2.6*  CREATININE 0.80  --   --  0.85  LATICACIDVEN  --  2.77* 0.93  --     Estimated Creatinine Clearance: 93.7 mL/min (by C-G formula based on SCr of 0.85 mg/dL).    No Known Allergies  Antimicrobials this admission: 11/28 Vanc >>  11/28 Zosyn >> 11/28  Microbiology results: 11/28 BCx: pending  Thank you for allowing pharmacy to be a part of this patient's care.  Foye DeerAnna Love, Pharm D PGY1 Pharmacy Resident  Phone [phone] Please use [facility] for clinical pharmacists numbers  12/22/2017      4:11 PM
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
## 2017-12-22 NOTE — ED Notes (Signed)
Provider bedside, RN removed bandages, several wounds on foot, appears some drainage from the wound on top of his foot.

## 2017-12-23 DIAGNOSIS — D696 Thrombocytopenia, unspecified: Secondary | ICD-10-CM

## 2017-12-23 DIAGNOSIS — L089 Local infection of the skin and subcutaneous tissue, unspecified: Secondary | ICD-10-CM

## 2017-12-23 DIAGNOSIS — E11628 Type 2 diabetes mellitus with other skin complications: Secondary | ICD-10-CM

## 2017-12-23 HISTORY — DX: Local infection of the skin and subcutaneous tissue, unspecified: E11.628

## 2017-12-23 HISTORY — DX: Local infection of the skin and subcutaneous tissue, unspecified: L08.9

## 2017-12-23 LAB — CBC WITH DIFFERENTIAL/PLATELET
Abs Immature Granulocytes: 0.01 10*3/uL (ref 0.00–0.07)
Basophils Absolute: 0 10*3/uL (ref 0.0–0.1)
Basophils Relative: 1 %
Eosinophils Absolute: 0 10*3/uL (ref 0.0–0.5)
Eosinophils Relative: 1 %
HCT: 39.5 % (ref 39.0–52.0)
Hemoglobin: 14 g/dL (ref 13.0–17.0)
Immature Granulocytes: 0 %
LYMPHS PCT: 27 %
Lymphs Abs: 0.9 10*3/uL (ref 0.7–4.0)
MCH: 30.4 pg (ref 26.0–34.0)
MCHC: 35.4 g/dL (ref 30.0–36.0)
MCV: 85.9 fL (ref 80.0–100.0)
Monocytes Absolute: 0.4 10*3/uL (ref 0.1–1.0)
Monocytes Relative: 10 %
Neutro Abs: 2.1 10*3/uL (ref 1.7–7.7)
Neutrophils Relative %: 61 %
Platelets: 86 10*3/uL — ABNORMAL LOW (ref 150–400)
RBC: 4.6 MIL/uL (ref 4.22–5.81)
RDW: 13.5 % (ref 11.5–15.5)
WBC: 3.4 10*3/uL — ABNORMAL LOW (ref 4.0–10.5)
nRBC: 0 % (ref 0.0–0.2)

## 2017-12-23 LAB — BASIC METABOLIC PANEL
Anion gap: 7 (ref 5–15)
BUN: 10 mg/dL (ref 6–20)
CHLORIDE: 102 mmol/L (ref 98–111)
CO2: 23 mmol/L (ref 22–32)
CREATININE: 0.74 mg/dL (ref 0.61–1.24)
Calcium: 8.4 mg/dL — ABNORMAL LOW (ref 8.9–10.3)
GFR calc Af Amer: >60 mL/min (ref >60)
GFR calc non Af Amer: >60 mL/min (ref >60)
Glucose, Bld: 379 mg/dL — ABNORMAL HIGH (ref 70–99)
Potassium: 3.7 mmol/L (ref 3.5–5.1)
SODIUM: 132 mmol/L — AB (ref 135–145)

## 2017-12-23 LAB — GLUCOSE, CAPILLARY
GLUCOSE-CAPILLARY: 273 mg/dL — AB (ref 70–99)
GLUCOSE-CAPILLARY: 207 mg/dL — AB (ref 70–99)
Glucose-Capillary: 375 mg/dL — ABNORMAL HIGH (ref 70–99)
Glucose-Capillary: 254 mg/dL — ABNORMAL HIGH (ref 70–99)

## 2017-12-23 MED ORDER — KETOROLAC TROMETHAMINE 30 MG/ML IJ SOLN
30.0000 mg | Freq: Four times a day (QID) | INTRAMUSCULAR | Status: DC | PRN
  Administered 2017-12-23: 30 mg via INTRAVENOUS
  Filled 2017-12-23: qty 1

## 2017-12-23 MED ORDER — INSULIN GLARGINE 100 UNIT/ML ~~LOC~~ SOLN
15.0000 [IU] | Freq: Every day | SUBCUTANEOUS | Status: DC
  Administered 2017-12-23 – 2017-12-24 (×2): 15 [IU] via SUBCUTANEOUS
  Filled 2017-12-23 (×2): qty 0.15

## 2017-12-23 MED ORDER — INSULIN GLARGINE 100 UNIT/ML ~~LOC~~ SOLN: 15.0000 [IU] | Freq: Every day | SUBCUTANEOUS | Status: DC

## 2017-12-23 MED ORDER — INSULIN GLARGINE 100 UNIT/ML ~~LOC~~ SOLN: 18.0000 [IU] | Freq: Every day | SUBCUTANEOUS | Status: DC

## 2017-12-23 MED ORDER — LEVOFLOXACIN 500 MG PO TABS
750.0000 mg | ORAL_TABLET | Freq: Every day | ORAL | Status: DC
  Administered 2017-12-23 – 2017-12-24 (×2): 750 mg via ORAL
  Filled 2017-12-23 (×2): qty 2

## 2017-12-23 MED ORDER — OXYCODONE HCL 5 MG PO TABS
5.0000 mg | ORAL_TABLET | Freq: Four times a day (QID) | ORAL | Status: DC | PRN
  Administered 2017-12-23 – 2017-12-24 (×3): 5 mg via ORAL
  Filled 2017-12-23 (×3): qty 1

## 2017-12-23 MED ORDER — TRAMADOL HCL 50 MG PO TABS
50.0000 mg | ORAL_TABLET | Freq: Four times a day (QID) | ORAL | Status: DC | PRN
  Administered 2017-12-23: 50 mg via ORAL
  Filled 2017-12-23: qty 1

## 2017-12-23 MED ORDER — GABAPENTIN 300 MG PO CAPS
300.0000 mg | ORAL_CAPSULE | Freq: Three times a day (TID) | ORAL | Status: DC
  Administered 2017-12-23 – 2017-12-24 (×4): 300 mg via ORAL
  Filled 2017-12-23 (×4): qty 1

## 2017-12-23 MED ORDER — TRAMADOL HCL 50 MG PO TABS: 100.0000 mg | ORAL_TABLET | Freq: Four times a day (QID) | ORAL | Status: DC | PRN

## 2017-12-23 NOTE — Plan of Care (Signed)
  Problem: Pain Managment: Goal: General experience of comfort will improve Outcome: Progressing   Problem: Safety: Goal: Ability to remain free from injury will improve Outcome: Progressing   Problem: Skin Integrity: Goal: Risk for impaired skin integrity will decrease Outcome: Progressing   

## 2017-12-23 NOTE — Plan of Care (Signed)
  Problem: Pain Managment: Goal: General experience of comfort will improve Outcome: Progressing   Problem: Safety: Goal: Ability to remain free from injury will improve Outcome: Progressing   

## 2017-12-23 NOTE — Progress Notes (Signed)
Notified Dr. Selena BattenKim of patients pain meds taken at home. Patient states he takes Tylenol, gabapentin, and naproxen for pain at home. States it did not hurt this bad at home.

## 2017-12-23 NOTE — Progress Notes (Signed)
Family Medicine Teaching Service Daily Progress Note Intern Pager: [phone]  Patient name: Mitchell [name] Medical record number: [id number] Date of birth: 10/26/1975 Age: 42 y.o. Gender: male  Primary Care Provider: Howard Pouch, MD Consultants: none Code Status: full  Pt Overview and Major Events to Date:  12/22/2017 admitted to fpts 12/23/2016 transitioned to oral abx  Assessment and Plan: Mitchell [name] is a 42 y.o. male presenting with worsening right foot cellulitis . PMH is significant for IDDM, pancytopenia, GERD, MDD  Right Foot Cellulitis Improving.  Unable to express any purulent fluid from foot.  Narrowed to vancomycin 11/28.  Afebrile, no leukocytosis.  Will transition to oral Levaquin to cover for both MRSA and Pseudomonas.  Can likely DC 11/30 if no worsening overnight.  Patient will need follow-up with orthopedics.  Would likely benefit from BKA and prosthetic and long-term, as his leg will likely continue to get infected chronically.  Had some increased pain overnight will increase gabapentin and tramadol. -Vital signs per floor routine -DC vancomycin -Levaquin 750 mg daily to complete 10-day total course -Lovenox for DVT prophylaxis -Diabetic diet -PT OT consult -Increase gabapentin to 300 mg 3 times daily -Increase tramadol to 100 mg for severe pain, continue 50 mg for moderate pain  Type 2 diabetes with peripheral neuropathy Sugars improving from greater than 400 to 273 at last check.  Will increase Lantus dose to 15 units.  Can take 24-hour sugars and convert back to 70/30 prior to discharge on 11/30.  A1c 10.7 will need outpatient follow-up for better glycemic control -Lantus 15 units -Insulin sliding scale insulin -Nighttime coverage  Peripheral neuropathy Likely contributing somewhat to patient's increased pain overnight.  Will increase gabapentin to 300 mg 3 times daily as he is likely underdosed on current regimen. -Increase gabapentin to 300 mg 3 times  daily  Thrombocytopenia/leukopenia Referral placed for heme-onc at last admission.  Patient has not seen them for unclear reasons, will make sure that referral is in place prior to discharge.  History of alcohol abuse Last three scores are 0, 0, 5.  Vital signs reassuring, no tremors.  We will continue to follow.  Will give as needed Ativan if needed  FEN/GI: carb modified diet PPx: Lovenox,  Disposition: Likely home 12/24/2017  Subjective:  Utilize Spanish approved by [employer] Patient feeling better this morning although did endorse foot pain last night.  He chronically has foot pain this is not changed, although it was a little bit worse last night.  He is a Horticulturist, commercial with patient.  Did have some reservations about going home today.  To stay 1 more night and make sure oral antibiotic is effective.  Objective: Temp:  [97.8 F (36.6 C)-97.9 F (36.6 C)] 97.8 F (36.6 C) (11/28 1955) Pulse Rate:  [62-66] 66 (11/29 0253) Resp:  [17-18] 17 (11/28 1955) BP: (110-128)/(79-84) 110/79 (11/29 0253) SpO2:  [100 %] 100 % (11/29 0253) Weight:  [66.5 kg] 66.5 kg (11/28 1608) Physical Exam: General: 42 year old Latino male.  Appears older than stated age.  Thin, no acute distress, resting comfortably Cardiovascular: Regular rate and rhythm, palpable PT DP right foot. Respiratory: Lungs clear to auscultation bilaterally, no acute distress, no accessory muscle use Abdomen: Soft, nontender, nondistended Extremities: Right foot consistent with previous exams.  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  Ulcerations in multiple stages of healing, no unable to express purulent material from ulcers.  Laboratory: Recent Labs  Lab 12/22/17 0050 12/22/17 0612 12/23/17 0115  WBC  3.1* 2.6* 3.4*  HGB 13.9 12.4* 14.0  HCT 37.8* 36.0* 39.5  PLT 104* 82* 86*   Recent Labs  Lab 12/22/17 0050 12/22/17 0612 12/23/17 0115  NA 131* 132* 132*  K 4.0 3.5  3.7  CL 100 101 102  CO2 22 25 23   BUN 12 10 10   CREATININE 0.80 0.85 0.74  CALCIUM 8.8* 8.2* 8.4*  PROT 7.4 6.5  --   BILITOT 0.9 0.9  --   ALKPHOS 128* 108  --   ALT 37 32  --   AST 39 29  --   GLUCOSE 472* 432* 379*    Imaging/Diagnostic Tests:   Mitchell [name], Mitchell Hence, MD 12/23/2017, 9:58 AM PGY-2, [facility] FPTS Intern pager: [phone], text pages welcome

## 2017-12-23 NOTE — Progress Notes (Addendum)
Inpatient Diabetes Program Recommendations  AACE/ADA: New Consensus Statement on Inpatient Glycemic Control (2015)  Target Ranges:  Prepandial:   less than 140 mg/dL      Peak postprandial:   less than 180 mg/dL (1-2 hours)      Critically ill patients:  140 - 180 mg/dL   Lab Results  Component Value Date   GLUCAP 273 (H) 12/23/2017   HGBA1C 10.7 (H) 12/22/2017    Review of Glycemic Control Results for Mitchell [name], Mitchell [name] (MRN [id number]) as of 12/23/2017 10:07  Ref. Range 12/22/2017 16:42 12/22/2017 21:17 12/23/2017 07:21  Glucose-Capillary Latest Ref Range: 70 - 99 mg/dL 782322 (H) 956398 (H) 213273 (H)   Diabetes history: Type 2 DM Outpatient Diabetes medications: Novolin 70/30 20 units QAM, 10 units QPM, Metformin 1000 mg BID Current orders for Inpatient glycemic control: Lantus 15 units QD, Novolog 0-9 units TID, Novolog 0-5 units QHS  Inpatient Diabetes Program Recommendations:    Spoke with patient previous hospitalization on 11/17/17. At that time, patient's home dose was on Lantus 55 units QD.   Based off of current blood glucose trends and in the setting of infections, consider increasing Lantus to 24 units QD (20% more than home basal dose).  If post prandials continue to exceed 180 mg/dL, consider Novolog 3 units TID (assuming that patient is consuming >50%).  Addendum @ 1130: Spoke with patient regarding management of DM. Patient verified medications above.  Reviewed patient's current A1c of 10.7%. Explained what a A1c is and what it measures. Also reviewed goal A1c with patient, importance of good glucose control @ home, and blood sugar goals. Reviewed patho DM, need for insulin, impact of poor control to continued infection, role of pancreas, and comorbidites.  [facility] and knows to follow up. Reviewed when to call MD for additional insulin needs.  Briefly discussed carb counting, reviewed meal options and which foods are better choices from a nutrition standpoint.  Encouraged patient to continue to follow up with PCP and ensure he has an appointment prior to [facility]. Patient verbalizes understanding and has no further questions at this time.  Thanks, Lujean RaveLauren Tayjon Halladay, MSN, RNC-OB Diabetes Coordinator [phone] (8a-5p)

## 2017-12-23 NOTE — Progress Notes (Signed)
Patient walking around in his room and stating the pain is so bad. Patient apologized repeatedly for attempting to leave and stated the pain is so bad and waving his hand in the air. I gave patient Ativam 1 mg IV per prn order. Informed pt that I would call Dr about his pain. Patient put his gown back on and got back in the bed and said thank you. Dr. Truitt MerleNotified about patient's status.

## 2017-12-23 NOTE — Progress Notes (Signed)
Patient agitated and wanting to leave. Patient fully dressed and sitting up in chair. States he is hurting bad. Tramadol given per prn order at 0117.

## 2017-12-24 LAB — CBC WITH DIFFERENTIAL/PLATELET
Abs Immature Granulocytes: 0.01 10*3/uL (ref 0.00–0.07)
Basophils Absolute: 0 10*3/uL (ref 0.0–0.1)
Basophils Relative: 0 %
Eosinophils Absolute: 0 10*3/uL (ref 0.0–0.5)
Eosinophils Relative: 2 %
HCT: 39 % (ref 39.0–52.0)
Hemoglobin: 13 g/dL (ref 13.0–17.0)
Immature Granulocytes: 0 %
Lymphocytes Relative: 34 %
Lymphs Abs: 0.9 10*3/uL (ref 0.7–4.0)
MCH: 29.3 pg (ref 26.0–34.0)
MCHC: 33.3 g/dL (ref 30.0–36.0)
MCV: 87.8 fL (ref 80.0–100.0)
Monocytes Absolute: 0.3 10*3/uL (ref 0.1–1.0)
Monocytes Relative: 10 %
NEUTROS ABS: 1.3 10*3/uL — AB (ref 1.7–7.7)
Neutrophils Relative %: 54 %
Platelets: 81 10*3/uL — ABNORMAL LOW (ref 150–400)
RBC: 4.44 MIL/uL (ref 4.22–5.81)
RDW: 13.6 % (ref 11.5–15.5)
WBC: 2.5 10*3/uL — ABNORMAL LOW (ref 4.0–10.5)
nRBC: 0 % (ref 0.0–0.2)

## 2017-12-24 LAB — BASIC METABOLIC PANEL
Anion gap: 9 (ref 5–15)
BUN: 9 mg/dL (ref 6–20)
CHLORIDE: 98 mmol/L (ref 98–111)
CO2: 27 mmol/L (ref 22–32)
Calcium: 8.3 mg/dL — ABNORMAL LOW (ref 8.9–10.3)
Creatinine, Ser: 0.75 mg/dL (ref 0.61–1.24)
GFR calc Af Amer: >60 mL/min (ref >60)
GFR calc non Af Amer: >60 mL/min (ref >60)
Glucose, Bld: 341 mg/dL — ABNORMAL HIGH (ref 70–99)
POTASSIUM: 3.6 mmol/L (ref 3.5–5.1)
Sodium: 134 mmol/L — ABNORMAL LOW (ref 135–145)

## 2017-12-24 LAB — GLUCOSE, CAPILLARY
GLUCOSE-CAPILLARY: 275 mg/dL — AB (ref 70–99)
Glucose-Capillary: 237 mg/dL — ABNORMAL HIGH (ref 70–99)
Glucose-Capillary: 293 mg/dL — ABNORMAL HIGH (ref 70–99)

## 2017-12-24 MED ORDER — LEVOFLOXACIN 750 MG PO TABS: 750.0000 mg | ORAL_TABLET | Freq: Every day | ORAL | 0 refills | Status: DC

## 2017-12-24 NOTE — Evaluation (Signed)
Physical Therapy Evaluation Patient Details Name: Mitchell [name] MRN: [id number] DOB: Oct 01, 1975 Today's Date: 12/24/2017   History of Present Illness   Patient is a 42 y.o. male presenting with worsening right foot cellulitis . PMH is significant for IDDM, pancytopenia, GERD, MDD  Clinical Impression  Orders received for PT evaluation. Patient demonstrates deficits in functional mobility as indicated below related specifically to pain in RLE. Anticipate that as pain improves, mobility status will as well. Currently requires no physical assist at this time but pain is significantly limiting function. May benefit from use of RW at discharge but patient prefers cane.     Follow Up Recommendations No PT follow up;Supervision for mobility/OOB    Equipment Recommendations  None recommended by PT    Recommendations for Other Services       Precautions / Restrictions Precautions Precautions: Fall      Mobility  Bed Mobility Overal bed mobility: Independent                Transfers Overall transfer level: Modified independent Equipment used: Rolling walker (2 wheeled);Straight cane             General transfer comment: no physical assist required  Ambulation/Gait Ambulation/Gait assistance: Supervision Gait Distance (Feet): 60 Feet Assistive device: Rolling walker (2 wheeled) Gait Pattern/deviations: Step-to pattern Gait velocity: decreased Gait velocity interpretation: <1.31 ft/sec, indicative of household ambulator General Gait Details: attempted 3 steps with cane, unable to tolerate due to pain, therefore switched to RW and ambulated 60 with self NWBing  Stairs            Wheelchair Mobility    Modified Rankin (Stroke Patients Only)       Balance Overall balance assessment: Mild deficits observed, not formally tested                                           Pertinent Vitals/Pain Pain Assessment: Faces Faces Pain Scale: Hurts  whole lot Pain Location: right foot/LE pain    Home Living Family/patient expects to be discharged to:: Private residence Living Arrangements: Alone Available Help at Discharge: Family Type of Home: Apartment Home Access: Level entry     Home Layout: One level Home Equipment: None      Prior Function Level of Independence: Independent with assistive device(s)               Hand Dominance   Dominant Hand: Right    Extremity/Trunk Assessment   Upper Extremity Assessment Upper Extremity Assessment: Overall WFL for tasks assessed    Lower Extremity Assessment Lower Extremity Assessment: RLE deficits/detail RLE Deficits / Details: significant discoloration and pain in RLE foot RLE: Unable to fully assess due to pain RLE Sensation: decreased light touch RLE Coordination: decreased fine motor;decreased gross motor       Communication   Communication: Prefers language other than English(able to speak/understand brief english for session)  Cognition Arousal/Alertness: Awake/alert Behavior During Therapy: WFL for tasks assessed/performed Overall Cognitive Status: Difficult to assess                                        General Comments      Exercises     Assessment/Plan    PT Assessment All further PT needs can be met in the next  venue of care  PT Problem List         PT Treatment Interventions      PT Goals (Current goals can be found in the Care Plan section)  Acute Rehab PT Goals Patient Stated Goal: to go home PT Goal Formulation: All assessment and education complete, DC therapy    Frequency     Barriers to discharge        Co-evaluation               AM-PAC PT "6 Clicks" Mobility  Outcome Measure Help needed turning from your back to your side while in a flat bed without using bedrails?: None Help needed moving from lying on your back to sitting on the side of a flat bed without using bedrails?: None Help needed  moving to and from a bed to a chair (including a wheelchair)?: None Help needed standing up from a chair using your arms (e.g., wheelchair or bedside chair)?: None Help needed to walk in hospital room?: A Little Help needed climbing 3-5 steps with a railing? : A Lot 6 Click Score: 21    End of Session Equipment Utilized During Treatment: Gait belt Activity Tolerance: Patient limited by pain Patient left: in bed;with call bell/phone within reach;with family/visitor present Nurse Communication: Mobility status PT Visit Diagnosis: Difficulty in walking, not elsewhere classified (R26.2)    Time: 4967-5916 PT Time Calculation (min) (ACUTE ONLY): 16 min   Charges:   PT Evaluation $PT Eval Moderate Complexity: 1 Mod          Alben Deeds, PT DPT  Board Certified Neurologic Specialist Acute Rehabilitation Services Pager [phone] Office [phone]   Duncan Dull 12/24/2017, 12:31 PM

## 2017-12-24 NOTE — Progress Notes (Signed)
Family Medicine Teaching Service Daily Progress Note Intern Pager: [phone]  Patient name: Mitchell [name] Medical record number: [id number] Date of birth: 07/06/1975 Age: 42 y.o. Gender: male  Primary Care Provider: Howard PouchFeng, Lauren, MD Consultants: none Code Status: full  Pt Overview and Major Events to Date:  12/22/2017 admitted to fpts 12/23/2016 transitioned to oral abx  Assessment and Plan: Mitchell [name] is a 42 y.o. male presenting with worsening right foot cellulitis . PMH is significant for IDDM, pancytopenia, GERD, MDD  Right Foot Cellulitis Improving. Afebrile, stable white count. No increased erythema, swelling, or purulence. Will DC with oral Levaquin to complete a total 7 day course. Will place ambulatory referral to orthopedics to discuss Right leg amputation with patient. This is likely the best long-term solution for patient given his chronic pain and recurrent infections. Will DC with increased dose of gabapentin. -Vital signs per floor routine -Levaquin 750 mg daily to complete 7-day total course -Lovenox for DVT prophylaxis -Diabetic diet -PT OT consult -Increase gabapentin to 300 mg 3 times daily  Type 2 diabetes with peripheral neuropathy Sugars still in mid 200s range. Improved from admission. Likely improved from baseline. Will send home on outpt 70/30 regimen and metformin. 70/30 can likely be increased and can possibly benefit from second oral medication. - d/c home on home regimen - follow up in 1 week for medication adjustment with pcp  Peripheral neuropathy Has had improved pain control with gabapentin. -Continue gabapentin to 300 mg 3 times daily  History of alcohol abuse Last three scores are 0, 0, 5.  Vital signs reassuring, no tremors.  We will continue to follow.  Will give as needed Ativan if needed  FEN/GI: carb modified diet PPx: Lovenox,  Disposition: Likely home 12/24/2017  Subjective:  Spanish interpreter utilized through [employer]  ipad. Feeling better this am. Says that pain has improved. No complaints, ready to go home.  Objective: Temp:  [97.9 F (36.6 C)-98.7 F (37.1 C)] 97.9 F (36.6 C) (11/30 0606) Pulse Rate:  [66-69] 69 (11/30 0606) Resp:  [18-19] 18 (11/29 1959) BP: (120-126)/(75-81) 120/75 (11/30 0606) SpO2:  [99 %-100 %] 100 % (11/30 0606) Physical Exam: General: 42 year old Latino male.  Appears older than stated age.  Thin, no acute distress, resting comfortably Cardiovascular: rrr, palpable PT DP right foot. Respiratory: LungsCTAB, no acute distress, no accessory muscle use Abdomen: Soft, nontender, nondistended Extremities: Right foot consistent with previous exams.  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  Ulcerations in multiple stages of healing, no unable to express purulent material from ulcers.  Laboratory: Recent Labs  Lab 12/22/17 0612 12/23/17 0115 12/24/17 0429  WBC 2.6* 3.4* 2.5*  HGB 12.4* 14.0 13.0  HCT 36.0* 39.5 39.0  PLT 82* 86* 81*   Recent Labs  Lab 12/22/17 0050 12/22/17 0612 12/23/17 0115 12/24/17 0429  NA 131* 132* 132* 134*  K 4.0 3.5 3.7 3.6  CL 100 101 102 98  CO2 22 25 23 27   BUN 12 10 10 9   CREATININE 0.80 0.85 0.74 0.75  CALCIUM 8.8* 8.2* 8.4* 8.3*  PROT 7.4 6.5  --   --   BILITOT 0.9 0.9  --   --   ALKPHOS 128* 108  --   --   ALT 37 32  --   --   AST 39 29  --   --   GLUCOSE 472* 432* 379* 341*    Imaging/Diagnostic Tests:   Myrene BuddyFletcher, Candra Wegner, MD 12/24/2017, 9:54 AM PGY-2, [facility] FPTS Intern pager: [phone], text pages welcome

## 2017-12-24 NOTE — Progress Notes (Signed)
Discharge instructions given to pt and wife with the use of [employer] translator, verbalized understanding.  Discharged to home accompanied by wife.

## 2017-12-25 ENCOUNTER — Other Ambulatory Visit: Payer: Self-pay | Admitting: Family Medicine

## 2017-12-25 ENCOUNTER — Telehealth (HOSPITAL_BASED_OUTPATIENT_CLINIC_OR_DEPARTMENT_OTHER): Payer: Self-pay | Admitting: Emergency Medicine

## 2017-12-25 LAB — BLOOD CULTURE ID PANEL (REFLEXED)
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
## 2017-12-25 NOTE — Progress Notes (Signed)
[facility] called stating patient could not afford Levaquin. He was given 5 day course of 750mg  once daily for diabetic wound infection.  Ciprofloxacin was much cheaper and is also off-label use for DM foot infection. 750mg  BID for 5 days prescribed to pharmacy over the phone.

## 2017-12-27 LAB — CULTURE, BLOOD (ROUTINE X 2): Culture: NO GROWTH

## 2017-12-30 ENCOUNTER — Ambulatory Visit (INDEPENDENT_AMBULATORY_CARE_PROVIDER_SITE_OTHER): Payer: Self-pay | Admitting: Family Medicine

## 2017-12-30 ENCOUNTER — Encounter: Payer: Self-pay | Admitting: Family Medicine

## 2017-12-30 ENCOUNTER — Other Ambulatory Visit: Payer: Self-pay

## 2017-12-30 VITALS — BP 118/74 | HR 85 | Temp 97.8°F | Wt 146.0 lb

## 2017-12-30 DIAGNOSIS — L97411 Non-pressure chronic ulcer of right heel and midfoot limited to breakdown of skin: Secondary | ICD-10-CM

## 2017-12-30 DIAGNOSIS — E11621 Type 2 diabetes mellitus with foot ulcer: Secondary | ICD-10-CM

## 2017-12-30 DIAGNOSIS — E118 Type 2 diabetes mellitus with unspecified complications: Secondary | ICD-10-CM

## 2017-12-30 MED ORDER — GABAPENTIN 100 MG PO CAPS: 100.0000 mg | ORAL_CAPSULE | Freq: Three times a day (TID) | ORAL | 0 refills | Status: DC

## 2017-12-30 NOTE — Patient Instructions (Signed)
It was great to see you again.  Your foot does not look infected.  It looks as though it is healing well.  I have sent you a prescription for pain medication to [facility].  You should take it three times a day.  Call the orthopedic office if you do not hear from them in the next week.  Their number is [phone].  Come back in three months to recheck your A1c.  If your foot starts to have more discharge, your pain is not well-controlled, you have increased warmth or redness, or you have fevers, please call us or come in.  Best, Dr. MJudie Petit

## 2017-12-30 NOTE — Assessment & Plan Note (Signed)
No evidence of infection on exam.  No fevers.  No drainage.  Appears to be well-healing with granulation tissue, no discharge noted. - will send Rx for Gabapentin 100mg  TID for pain - Ortho referral pending

## 2017-12-30 NOTE — Progress Notes (Signed)
Subjective: Chief Complaint  Patient presents with  . Hospitalization Follow-up     HPI: Mitchell [name] is a 42 y.o. presenting to clinic today to discuss the following:  Hospital F/U Cellulitis Right Foot Patient admitted to hospital 11/28 for right foot cellulitis.  Also had previous admission on 10/23 for the same.  Patient notes that following his admission in October, he was placed on Augmentin outpatient and his foot improved.  He then noticed increasing discharge on 11/25 and presented to the ED on 11/28.  At that time he had noted 1 subjective fever but no increased redness or warmth, just increased drainage.  In the hospital he received 1 dose of vancomycin, 2 doses of Zosyn, and Levaquin.  On discharge he was sent home with a 5-day course of ciprofloxacin due to cost.  He states that he will have 1 more pill to take today.  Since discharge he feels that his foot is worse. Continues to have discharge and pain.  He notes that most of the pain is on the dorsum of his right foot.Marland Kitchen.  Has wounds on lateral edge of foot.  Patient is requesting pain medication as he states that he cannot stand the pain.  Patient has been using tylenol and naproxen without improvement.  Rates the pain 7/10.  Felt as though his pain was better controlled in the hospital.  Describes the pain as stabbing pain that is constant.  Denies any recent fevers or chills.  Has not noticed that his foot has had worsening erythema or warmth.  Diabetes  CBG 200-250 recently.  He states that his sugars have improved since leaving the hospital.  A1c was 10.7 on 11/28.  Has been doing 70/30 insulin 20 units of breakfast and 10 units at supper as well as metformin 1000 mg twice daily.  States that he has been compliant with this regimen.      ROS noted in HPI.   Past Medical, Surgical, Social, and Family History Reviewed & Updated per EMR.   Pertinent Historical Findings include:   Social History   Tobacco Use    Smoking Status Never Smoker  Smokeless Tobacco Never Used      Objective: BP 118/74   Pulse 85   Temp 97.8 F (36.6 C) (Oral)   Wt 146 lb (66.2 kg)   SpO2 98%   BMI 26.87 kg/m  Vitals and nursing notes reviewed  Physical Exam: General: 42 y.o. male in NAD Cardio: RRR no m/r/g Lungs: CTAB, no wheezing, no rhonchi, no crackles, no increased work of breathing Skin: warm and dry Extremities: No edema  Right Foot: absent great toe, 3cm healing right foot ulcer on dorsum of foot, well-healing with granulation tissue, no drainage noted, no increased warmth or erythema, healing ulcer noted at lateral base of fifth metatarsal, diffuse contractures noted throughout with concavity on dorsal aspect of foot        No results found for this or any previous visit (from the past 72 hour(s)).  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  No fevers.  No drainage.  Appears to be well-healing with granulation tissue, no discharge noted. - will send Rx for Gabapentin 100mg  TID for pain - Ortho referral pending  Diabetes mellitus type 2 with complications (HCC) A1c 10.7 on 11/28.  Patient states CBGs are improving at home since leaving the hospital.  Plan to  recheck in 3 months and consider addition of glipizide, as had good control with this in the past. - cont current home insulin 70/30, 20u breakfast, 10u supper - cont home metformin 1000mg  BID     PATIENT EDUCATION PROVIDED: See AVS    Diagnosis and plan along with any newly prescribed medication(s) were discussed in detail with this patient today. The patient verbalized understanding and agreed with the plan. Patient advised if symptoms worsen return to clinic or ER.    No orders of the defined types were placed in this encounter.   Meds ordered this encounter  Medications  . gabapentin (NEURONTIN) 100 MG capsule     Sig: Take 1 capsule (100 mg total) by mouth 3 (three) times daily.    Dispense:  90 capsule    Refill:  0     Luis Abed, DO 12/30/2017, 9:43 AM PGY-1 [facility]

## 2017-12-30 NOTE — Assessment & Plan Note (Signed)
A1c 10.7 on 11/28.  Patient states CBGs are improving at home since leaving the hospital.  Plan to recheck in 3 months and consider addition of glipizide, as had good control with this in the past. - cont current home insulin 70/30, 20u breakfast, 10u supper - cont home metformin 1000mg  BID

## 2018-01-16 ENCOUNTER — Emergency Department (HOSPITAL_COMMUNITY): Payer: Self-pay

## 2018-01-16 ENCOUNTER — Inpatient Hospital Stay (HOSPITAL_COMMUNITY)
Admission: EM | Admit: 2018-01-16 | Discharge: 2018-01-21 | DRG: 872 | Disposition: A | Discharge status: Home Health | Payer: Self-pay | Attending: Family Medicine | Admitting: Family Medicine

## 2018-01-16 ENCOUNTER — Encounter (HOSPITAL_COMMUNITY): Payer: Self-pay

## 2018-01-16 ENCOUNTER — Other Ambulatory Visit: Payer: Self-pay

## 2018-01-16 DIAGNOSIS — E11628 Type 2 diabetes mellitus with other skin complications: Secondary | ICD-10-CM

## 2018-01-16 DIAGNOSIS — F1021 Alcohol dependence, in remission: Secondary | ICD-10-CM | POA: Diagnosis present

## 2018-01-16 DIAGNOSIS — M79671 Pain in right foot: Secondary | ICD-10-CM

## 2018-01-16 DIAGNOSIS — E1142 Type 2 diabetes mellitus with diabetic polyneuropathy: Secondary | ICD-10-CM | POA: Diagnosis present

## 2018-01-16 DIAGNOSIS — L97511 Non-pressure chronic ulcer of other part of right foot limited to breakdown of skin: Secondary | ICD-10-CM

## 2018-01-16 DIAGNOSIS — Z89411 Acquired absence of right great toe: Secondary | ICD-10-CM

## 2018-01-16 DIAGNOSIS — L02611 Cutaneous abscess of right foot: Secondary | ICD-10-CM

## 2018-01-16 DIAGNOSIS — E876 Hypokalemia: Secondary | ICD-10-CM | POA: Diagnosis present

## 2018-01-16 DIAGNOSIS — Z794 Long term (current) use of insulin: Secondary | ICD-10-CM

## 2018-01-16 DIAGNOSIS — A401 Sepsis due to streptococcus, group B: Principal | ICD-10-CM | POA: Diagnosis present

## 2018-01-16 DIAGNOSIS — T25021S Burn of unspecified degree of right foot, sequela: Secondary | ICD-10-CM

## 2018-01-16 DIAGNOSIS — Z833 Family history of diabetes mellitus: Secondary | ICD-10-CM

## 2018-01-16 DIAGNOSIS — F329 Major depressive disorder, single episode, unspecified: Secondary | ICD-10-CM | POA: Diagnosis present

## 2018-01-16 DIAGNOSIS — L03115 Cellulitis of right lower limb: Secondary | ICD-10-CM

## 2018-01-16 DIAGNOSIS — Z79899 Other long term (current) drug therapy: Secondary | ICD-10-CM

## 2018-01-16 DIAGNOSIS — M86671 Other chronic osteomyelitis, right ankle and foot: Secondary | ICD-10-CM | POA: Diagnosis present

## 2018-01-16 DIAGNOSIS — E1169 Type 2 diabetes mellitus with other specified complication: Secondary | ICD-10-CM

## 2018-01-16 DIAGNOSIS — E871 Hypo-osmolality and hyponatremia: Secondary | ICD-10-CM | POA: Diagnosis present

## 2018-01-16 DIAGNOSIS — L03031 Cellulitis of right toe: Secondary | ICD-10-CM

## 2018-01-16 DIAGNOSIS — E118 Type 2 diabetes mellitus with unspecified complications: Secondary | ICD-10-CM

## 2018-01-16 DIAGNOSIS — K219 Gastro-esophageal reflux disease without esophagitis: Secondary | ICD-10-CM | POA: Diagnosis present

## 2018-01-16 DIAGNOSIS — E1165 Type 2 diabetes mellitus with hyperglycemia: Secondary | ICD-10-CM | POA: Diagnosis present

## 2018-01-16 DIAGNOSIS — K7460 Unspecified cirrhosis of liver: Secondary | ICD-10-CM | POA: Diagnosis present

## 2018-01-16 DIAGNOSIS — D61818 Other pancytopenia: Secondary | ICD-10-CM | POA: Diagnosis present

## 2018-01-16 DIAGNOSIS — L089 Local infection of the skin and subcutaneous tissue, unspecified: Secondary | ICD-10-CM

## 2018-01-16 DIAGNOSIS — A419 Sepsis, unspecified organism: Secondary | ICD-10-CM

## 2018-01-16 DIAGNOSIS — E119 Type 2 diabetes mellitus without complications: Secondary | ICD-10-CM

## 2018-01-16 LAB — COMPREHENSIVE METABOLIC PANEL
ALT: 50 U/L — ABNORMAL HIGH (ref 0–44)
AST: 75 U/L — ABNORMAL HIGH (ref 15–41)
Albumin: 3.8 g/dL (ref 3.5–5.0)
Alkaline Phosphatase: 94 U/L (ref 38–126)
Anion gap: 16 — ABNORMAL HIGH (ref 5–15)
BUN: 13 mg/dL (ref 6–20)
CO2: 19 mmol/L — ABNORMAL LOW (ref 22–32)
Calcium: 8.5 mg/dL — ABNORMAL LOW (ref 8.9–10.3)
Chloride: 98 mmol/L (ref 98–111)
Creatinine, Ser: 0.97 mg/dL (ref 0.61–1.24)
GFR calc Af Amer: >60 mL/min (ref >60)
GFR calc non Af Amer: >60 mL/min (ref >60)
Glucose, Bld: 270 mg/dL — ABNORMAL HIGH (ref 70–99)
Potassium: 3.4 mmol/L — ABNORMAL LOW (ref 3.5–5.1)
Sodium: 133 mmol/L — ABNORMAL LOW (ref 135–145)
Total Bilirubin: 3.2 mg/dL — ABNORMAL HIGH (ref 0.3–1.2)
Total Protein: 8 g/dL (ref 6.5–8.1)

## 2018-01-16 LAB — CBC WITH DIFFERENTIAL/PLATELET
ABS IMMATURE GRANULOCYTES: 0.02 10*3/uL (ref 0.00–0.07)
Basophils Absolute: 0 10*3/uL (ref 0.0–0.1)
Basophils Relative: 0 %
Eosinophils Absolute: 0 10*3/uL (ref 0.0–0.5)
Eosinophils Relative: 0 %
HCT: 44.6 % (ref 39.0–52.0)
Hemoglobin: 15.3 g/dL (ref 13.0–17.0)
IMMATURE GRANULOCYTES: 0 %
Lymphocytes Relative: 9 %
Lymphs Abs: 0.7 10*3/uL (ref 0.7–4.0)
MCH: 30.2 pg (ref 26.0–34.0)
MCHC: 34.3 g/dL (ref 30.0–36.0)
MCV: 88 fL (ref 80.0–100.0)
MONO ABS: 0.2 10*3/uL (ref 0.1–1.0)
MONOS PCT: 3 %
NEUTROS PCT: 88 %
Neutro Abs: 6.7 10*3/uL (ref 1.7–7.7)
Platelets: 75 10*3/uL — ABNORMAL LOW (ref 150–400)
RBC: 5.07 MIL/uL (ref 4.22–5.81)
RDW: 13.8 % (ref 11.5–15.5)
WBC: 7.6 10*3/uL (ref 4.0–10.5)
nRBC: 0 % (ref 0.0–0.2)

## 2018-01-16 LAB — GLUCOSE, CAPILLARY
Glucose-Capillary: 278 mg/dL — ABNORMAL HIGH (ref 70–99)
Glucose-Capillary: 254 mg/dL — ABNORMAL HIGH (ref 70–99)

## 2018-01-16 LAB — LACTIC ACID, PLASMA
LACTIC ACID, VENOUS: 1.8 mmol/L (ref 0.5–1.9)
Lactic Acid, Venous: 1.9 mmol/L (ref 0.5–1.9)

## 2018-01-16 LAB — I-STAT CG4 LACTIC ACID, ED
LACTIC ACID, VENOUS: 5.57 mmol/L — AB (ref 0.5–1.9)
Lactic Acid, Venous: 1.42 mmol/L (ref 0.5–1.9)

## 2018-01-16 LAB — CBG MONITORING, ED: Glucose-Capillary: 264 mg/dL — ABNORMAL HIGH (ref 70–99)

## 2018-01-16 MED ORDER — OXYCODONE HCL 5 MG PO TABS
5.0000 mg | ORAL_TABLET | ORAL | Status: DC | PRN
  Administered 2018-01-16 – 2018-01-20 (×18): 5 mg via ORAL
  Filled 2018-01-16 (×19): qty 1

## 2018-01-16 MED ORDER — SODIUM CHLORIDE 0.9 % IV SOLN
2.0000 g | INTRAVENOUS | Status: DC
  Administered 2018-01-16: 2 g via INTRAVENOUS
  Filled 2018-01-16 (×2): qty 20

## 2018-01-16 MED ORDER — VANCOMYCIN HCL IN DEXTROSE 750-5 MG/150ML-% IV SOLN
750.0000 mg | Freq: Two times a day (BID) | INTRAVENOUS | Status: DC
  Administered 2018-01-17: 750 mg via INTRAVENOUS
  Filled 2018-01-16: qty 150

## 2018-01-16 MED ORDER — ACETAMINOPHEN 500 MG PO TABS
1000.0000 mg | ORAL_TABLET | Freq: Three times a day (TID) | ORAL | Status: DC
  Administered 2018-01-17 – 2018-01-21 (×15): 1000 mg via ORAL
  Filled 2018-01-16 (×15): qty 2

## 2018-01-16 MED ORDER — ENOXAPARIN SODIUM 40 MG/0.4ML ~~LOC~~ SOLN
40.0000 mg | SUBCUTANEOUS | Status: DC
  Administered 2018-01-16 – 2018-01-20 (×5): 40 mg via SUBCUTANEOUS
  Filled 2018-01-16 (×6): qty 0.4

## 2018-01-16 MED ORDER — SODIUM CHLORIDE 0.9 % IV BOLUS (SEPSIS)
1000.0000 mL | Freq: Once | INTRAVENOUS | Status: AC
  Administered 2018-01-16: 1000 mL via INTRAVENOUS

## 2018-01-16 MED ORDER — POTASSIUM CHLORIDE CRYS ER 20 MEQ PO TBCR
40.0000 meq | EXTENDED_RELEASE_TABLET | Freq: Once | ORAL | Status: AC
  Administered 2018-01-16: 40 meq via ORAL
  Filled 2018-01-16: qty 2

## 2018-01-16 MED ORDER — ACETAMINOPHEN 650 MG RE SUPP: 650.0000 mg | Freq: Four times a day (QID) | RECTAL | Status: DC | PRN

## 2018-01-16 MED ORDER — POLYETHYLENE GLYCOL 3350 17 G PO PACK
17.0000 g | PACK | Freq: Every day | ORAL | Status: DC | PRN
  Administered 2018-01-19 – 2018-01-20 (×2): 17 g via ORAL
  Filled 2018-01-16 (×2): qty 1

## 2018-01-16 MED ORDER — ONDANSETRON HCL 4 MG/2ML IJ SOLN
4.0000 mg | Freq: Once | INTRAMUSCULAR | Status: AC
  Administered 2018-01-16: 4 mg via INTRAVENOUS
  Filled 2018-01-16: qty 2

## 2018-01-16 MED ORDER — ACETAMINOPHEN 500 MG PO TABS
1000.0000 mg | ORAL_TABLET | Freq: Once | ORAL | Status: AC
  Administered 2018-01-16: 1000 mg via ORAL
  Filled 2018-01-16: qty 2

## 2018-01-16 MED ORDER — ACETAMINOPHEN 325 MG PO TABS
650.0000 mg | ORAL_TABLET | Freq: Four times a day (QID) | ORAL | Status: DC | PRN
  Administered 2018-01-16: 650 mg via ORAL
  Filled 2018-01-16: qty 2

## 2018-01-16 MED ORDER — ONDANSETRON HCL 4 MG/2ML IJ SOLN: 4.0000 mg | Freq: Four times a day (QID) | INTRAMUSCULAR | Status: DC | PRN

## 2018-01-16 MED ORDER — VANCOMYCIN HCL IN DEXTROSE 1-5 GM/200ML-% IV SOLN
1000.0000 mg | Freq: Once | INTRAVENOUS | Status: AC
  Administered 2018-01-16: 1000 mg via INTRAVENOUS
  Filled 2018-01-16: qty 200

## 2018-01-16 MED ORDER — INSULIN ASPART 100 UNIT/ML ~~LOC~~ SOLN
0.0000 [IU] | Freq: Three times a day (TID) | SUBCUTANEOUS | Status: DC
  Administered 2018-01-16 – 2018-01-17 (×2): 8 [IU] via SUBCUTANEOUS
  Administered 2018-01-17: 15 [IU] via SUBCUTANEOUS
  Administered 2018-01-17: 5 [IU] via SUBCUTANEOUS
  Administered 2018-01-18: 8 [IU] via SUBCUTANEOUS
  Administered 2018-01-18 – 2018-01-19 (×2): 11 [IU] via SUBCUTANEOUS
  Administered 2018-01-19 – 2018-01-20 (×4): 3 [IU] via SUBCUTANEOUS
  Administered 2018-01-21: 5 [IU] via SUBCUTANEOUS
  Administered 2018-01-21: 2 [IU] via SUBCUTANEOUS

## 2018-01-16 MED ORDER — ONDANSETRON HCL 4 MG PO TABS: 4.0000 mg | ORAL_TABLET | Freq: Four times a day (QID) | ORAL | Status: DC | PRN

## 2018-01-16 NOTE — Progress Notes (Signed)
Inpatient Diabetes Program Recommendations  AACE/ADA: New Consensus Statement on Inpatient Glycemic Control (2019)  Target Ranges:  Prepandial:   less than 140 mg/dL      Peak postprandial:   less than 180 mg/dL (1-2 hours)      Critically ill patients:  140 - 180 mg/dL   Results for Mitchell [name], Mitchell [name] (MRN [id number]) as of 01/16/2018 13:42  Ref. Range 01/16/2018 11:22  Glucose-Capillary Latest Ref Range: 70 - 99 mg/dL 409264 (H)  Results for Mitchell [name], Mitchell [name] (MRN [id number]) as of 01/16/2018 13:42  Ref. Range 02/27/2017 06:40 12/22/2017 06:12  Hemoglobin A1C Latest Ref Range: 4.8 - 5.6 % 11.9 (H) 10.7 (H)   Review of Glycemic Control  Diabetes history: DM2 Outpatient Diabetes medications: 70/30 20 units QAM with breakfast, 70/30 10 units QPM with supper, Metformin 1000 mg QAM Current orders for Inpatient glycemic control: None; in ER at this time  Inpatient Diabetes Program Recommendations:  Insulin - Basal: If patient is admitted, please consider ordering Lantus 20 units daily. Correction (SSI): If patient is admitted, please consider ordering CBGs with Novolog 0-9 units TID with meals and Novolog 0-5 units QHS. Insulin - Meal Coverage: If post prandial glucose is consistently greater than 180 mg/dl with Lantus and Novolog correction, may want to consider ordering Novolog 3 units TID with meals for meal coverage. HgbA1C: A1C 10.7% on 12/22/17. Patient was seen by Inpatient Diabetes Coordinator on 12/23/17 during last hospital admission.  Thanks, Orlando PennerMarie Cheskel Silverio, RN, MSN, CDE Diabetes Coordinator Inpatient Diabetes Program [phone] (Team Pager from 8am to 5pm)

## 2018-01-16 NOTE — ED Notes (Signed)
Attempted to call report
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
## 2018-01-16 NOTE — Progress Notes (Signed)
Pharmacy Antibiotic Note  Mitchell [name] is a 42 y.o. male admitted on 01/16/2018 with cellulitis.  Pharmacy has been consulted for vancomycin dosing. Pt is febrile with Tmax 102.8 and WBC is WNL. SCr is WNL. Lactic acid is elevated at 5.57.   Plan: Vancomycin 1gm IV x 1 then 750mg  IV Q12H F/u renal fxn, C&S, clinical status and vanc peak/trough at SS  Height: 5' 1.81" (157 cm) Weight: 146 lb (66.2 kg) IBW/kg (Calculated) : 54.17  Temp (24hrs), Avg:102.8 F (39.3 C), Min:102.8 F (39.3 C), Max:102.8 F (39.3 C)  No results for input(s): WBC, CREATININE, LATICACIDVEN, VANCOTROUGH, VANCOPEAK, VANCORANDOM, GENTTROUGH, GENTPEAK, GENTRANDOM, TOBRATROUGH, TOBRAPEAK, TOBRARND, AMIKACINPEAK, AMIKACINTROU, AMIKACIN in the last 168 hours.  CrCl cannot be calculated (Patient's most recent lab result is older than the maximum 21 days allowed.).    No Known Allergies  Antimicrobials this admission: Vanc 12/23>> CTX 12/23>>  Dose adjustments this admission: N/A  Microbiology results: Pending  Thank you for allowing pharmacy to be a part of this patient's care.  Mitchell [name], Mitchell [name] 01/16/2018 11:02 AM
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
## 2018-01-16 NOTE — ED Triage Notes (Addendum)
Pt came in with c/o foot infection. He is noted to be shivering and limping on arrival. Pt with hx of diabetes, osteomyelitis, and diabetes.
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
## 2018-01-17 ENCOUNTER — Inpatient Hospital Stay (HOSPITAL_COMMUNITY): Payer: Self-pay

## 2018-01-17 ENCOUNTER — Other Ambulatory Visit: Payer: Self-pay

## 2018-01-17 DIAGNOSIS — L97511 Non-pressure chronic ulcer of other part of right foot limited to breakdown of skin: Secondary | ICD-10-CM

## 2018-01-17 DIAGNOSIS — E119 Type 2 diabetes mellitus without complications: Secondary | ICD-10-CM

## 2018-01-17 DIAGNOSIS — I70238 Atherosclerosis of native arteries of right leg with ulceration of other part of lower right leg: Secondary | ICD-10-CM

## 2018-01-17 DIAGNOSIS — R0989 Other specified symptoms and signs involving the circulatory and respiratory systems: Secondary | ICD-10-CM

## 2018-01-17 LAB — BLOOD CULTURE ID PANEL (REFLEXED)
Acinetobacter baumannii: NOT DETECTED
Candida albicans: NOT DETECTED
Candida glabrata: NOT DETECTED
Candida krusei: NOT DETECTED
Candida parapsilosis: NOT DETECTED
Candida tropicalis: NOT DETECTED
ESCHERICHIA COLI: NOT DETECTED
Enterobacter cloacae complex: NOT DETECTED
Enterobacteriaceae species: NOT DETECTED
Enterococcus species: NOT DETECTED
Haemophilus influenzae: NOT DETECTED
Klebsiella oxytoca: NOT DETECTED
Klebsiella pneumoniae: NOT DETECTED
Listeria monocytogenes: NOT DETECTED
Neisseria meningitidis: NOT DETECTED
Proteus species: NOT DETECTED
Pseudomonas aeruginosa: NOT DETECTED
Serratia marcescens: NOT DETECTED
Staphylococcus aureus (BCID): NOT DETECTED
Staphylococcus species: NOT DETECTED
Streptococcus agalactiae: DETECTED — AB
Streptococcus pneumoniae: NOT DETECTED
Streptococcus pyogenes: NOT DETECTED
Streptococcus species: DETECTED — AB

## 2018-01-17 LAB — GLUCOSE, CAPILLARY
Glucose-Capillary: 203 mg/dL — ABNORMAL HIGH (ref 70–99)
Glucose-Capillary: 268 mg/dL — ABNORMAL HIGH (ref 70–99)
Glucose-Capillary: 370 mg/dL — ABNORMAL HIGH (ref 70–99)
Glucose-Capillary: 201 mg/dL — ABNORMAL HIGH (ref 70–99)

## 2018-01-17 LAB — CBC
HEMATOCRIT: 35.3 % — AB (ref 39.0–52.0)
HEMOGLOBIN: 12.6 g/dL — AB (ref 13.0–17.0)
MCH: 30.9 pg (ref 26.0–34.0)
MCHC: 35.7 g/dL (ref 30.0–36.0)
MCV: 86.5 fL (ref 80.0–100.0)
Platelets: 55 10*3/uL — ABNORMAL LOW (ref 150–400)
RBC: 4.08 MIL/uL — ABNORMAL LOW (ref 4.22–5.81)
RDW: 13.6 % (ref 11.5–15.5)
WBC: 2.9 10*3/uL — ABNORMAL LOW (ref 4.0–10.5)
nRBC: 0 % (ref 0.0–0.2)

## 2018-01-17 LAB — BASIC METABOLIC PANEL
Anion gap: 8 (ref 5–15)
BUN: 14 mg/dL (ref 6–20)
CHLORIDE: 102 mmol/L (ref 98–111)
CO2: 22 mmol/L (ref 22–32)
Calcium: 7.8 mg/dL — ABNORMAL LOW (ref 8.9–10.3)
Creatinine, Ser: 0.77 mg/dL (ref 0.61–1.24)
GFR calc Af Amer: >60 mL/min (ref >60)
GFR calc non Af Amer: >60 mL/min (ref >60)
Glucose, Bld: 266 mg/dL — ABNORMAL HIGH (ref 70–99)
POTASSIUM: 3.6 mmol/L (ref 3.5–5.1)
Sodium: 132 mmol/L — ABNORMAL LOW (ref 135–145)

## 2018-01-17 LAB — SEDIMENTATION RATE: Sed Rate: 28 mm/hr — ABNORMAL HIGH (ref 0–16)

## 2018-01-17 LAB — C-REACTIVE PROTEIN: CRP: 5.7 mg/dL — ABNORMAL HIGH (ref <1.0)

## 2018-01-17 MED ORDER — SODIUM CHLORIDE 0.9 % IV SOLN
3.0000 g | Freq: Three times a day (TID) | INTRAVENOUS | Status: DC
  Administered 2018-01-17 – 2018-01-19 (×6): 3 g via INTRAVENOUS
  Filled 2018-01-17 (×8): qty 3

## 2018-01-17 MED ORDER — KETOROLAC TROMETHAMINE 30 MG/ML IJ SOLN
30.0000 mg | Freq: Four times a day (QID) | INTRAMUSCULAR | Status: DC | PRN
  Administered 2018-01-17: 30 mg via INTRAVENOUS
  Filled 2018-01-17: qty 1

## 2018-01-17 NOTE — Progress Notes (Signed)
Pharmacy Antibiotic Note  Mitchell [name] is a 42 y.o. male admitted on 01/16/2018 with bacteremia and wound infection.  Pharmacy has been consulted for Unasyn dosing. WBC 2.9. Renal function good.   Plan: Unasyn 3g IV q8h Trend WBC, temp, renal function   Height: 5\' 1"  (154.9 cm) Weight: 144 lb 6.4 oz (65.5 kg) IBW/kg (Calculated) : 52.3  Temp (24hrs), Avg:101.2 F (38.4 C), Min:97.9 F (36.6 C), Max:103.9 F (39.9 C)  Recent Labs  Lab 01/16/18 1108 01/16/18 1117 01/16/18 1328 01/16/18 1606 01/16/18 1840 01/17/18 0332  WBC 7.6  --   --   --   --  2.9*  CREATININE 0.97  --   --   --   --  0.77  LATICACIDVEN  --  5.57* 1.42 1.9 1.8  --     Estimated Creatinine Clearance: 97 mL/min (by C-G formula based on SCr of 0.77 mg/dL).    No Known Allergies    Abran DukeLedford, Wana Mount 01/17/2018 6:24 AM

## 2018-01-17 NOTE — Progress Notes (Signed)
VASCULAR LAB PRELIMINARY  ARTERIAL  ABI completed:    RIGHT    LEFT    PRESSURE WAVEFORM  PRESSURE WAVEFORM  BRACHIAL 104  Triphasic BRACHIAL 102 Triphasic  DP   DP 113 Triphasic  AT 113 Triphasic AT    PT 122 Triphasic PT 128 Triphasic  104  RIGHT LEFT  ABI 1.17 1.23   ABIs and Doppler waveforms are within normal limits bilaterally at rest. Unable to evaluate the right DP due to open wound. Also unable to obtain TBI due to amputation of the great toe and anatomy of the remaining toes.  Josh Nicolosi, RVS 01/17/2018, 1:44 PM
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
## 2018-01-17 NOTE — Progress Notes (Signed)
PHARMACY - PHYSICIAN COMMUNICATION CRITICAL VALUE ALERT - BLOOD CULTURE IDENTIFICATION (BCID)  Mitchell [name] is an 42 y.o. male who [facility] with a chief complaint of foot pain/infection  Assessment:  Blood culture growing Group B Strep  Name of physician (or Provider) Contacted: Dr. Annia FriendlyBeard  Current antibiotics:  Vancomycin and Rocephin  Changes to prescribed antibiotics recommended:   Given recent h/o Bacteroides bacteremia, consider changing to Unasyn or Augmentin to treat bacteremia and possible multi-organism foot wound.   Results for orders placed or performed during the hospital encounter of 01/16/18  Blood Culture ID Panel (Reflexed) (Collected: 01/16/2018 11:10 AM)  Result Value Ref Range   Enterococcus species NOT DETECTED NOT DETECTED   Listeria monocytogenes NOT DETECTED NOT DETECTED   Staphylococcus species NOT DETECTED NOT DETECTED   Staphylococcus aureus (BCID) NOT DETECTED NOT DETECTED   Streptococcus species DETECTED (A) NOT DETECTED   Streptococcus agalactiae DETECTED (A) NOT DETECTED   Streptococcus pneumoniae NOT DETECTED NOT DETECTED   Streptococcus pyogenes NOT DETECTED NOT DETECTED   Acinetobacter baumannii NOT DETECTED NOT DETECTED   Enterobacteriaceae species NOT DETECTED NOT DETECTED   Enterobacter cloacae complex NOT DETECTED NOT DETECTED   Escherichia coli NOT DETECTED NOT DETECTED   Klebsiella oxytoca NOT DETECTED NOT DETECTED   Klebsiella pneumoniae NOT DETECTED NOT DETECTED   Proteus species NOT DETECTED NOT DETECTED   Serratia marcescens NOT DETECTED NOT DETECTED   Haemophilus influenzae NOT DETECTED NOT DETECTED   Neisseria meningitidis NOT DETECTED NOT DETECTED   Pseudomonas aeruginosa NOT DETECTED NOT DETECTED   Candida albicans NOT DETECTED NOT DETECTED   Candida glabrata NOT DETECTED NOT DETECTED   Candida krusei NOT DETECTED NOT DETECTED   Candida parapsilosis NOT DETECTED NOT DETECTED   Candida tropicalis  NOT DETECTED NOT DETECTED    Mitchell [name], Mitchell [name] 01/17/2018  5:18 AM

## 2018-01-17 NOTE — Progress Notes (Addendum)
Family Medicine Teaching Service Daily Progress Note Intern Pager: [phone]  Patient name: Mitchell [name] Medical record number: [id number] Date of birth: 08-Mar-1975 Age: 42 y.o. Gender: male  Primary Care Provider: Everrett Coombe, MD Consultants: None (consider Vasc vs Ortho) Code Status: Full  Pt Overview and Major Events to Date:  /23/2018-admitted with sepsis secondary to R LE foot infection  Assessment and Plan: Mitchell [name] is a 42 y.o. male presenting with sepsis 2/2 RLE foot infection. PMH is significant for DMII, GERD, depression, and chronic osteomyelitis of right foot.   Sepsis 2/2 Cellulitis R foot, acute, recurring issue: WBC count normal, BC growing Strep agalactiae (GBS). Temperature 97.39F on 12/24, controlled with tylenol. Urinalysis and culture pending, patient denies urinary symptoms. CBC showed normal results but patient has history of leukopenia so the WBC count of 7.6 > 2.9 back to within normal range. -consider vascular surgery/ortho consult -d/c CTX and Vanc, start Unasyn (12/24-), possibly switch to 2wks Augmentin on d/c -tylenol 1028m TID for pain and fever -Oxy 5 q4 PRN severe pain (patient is receiving every 4-6 hours) -Adding on Toradol [id number] PRN Pain -zofran PRN -f/u urinalysis/culture - needs collecting -wound care consulted, appreciate recs - recommend VVS vs Ortho consult -ABI ordered -ESR and CRP ordered, f/u results -PT consulted -vitals per unit protocol  Insulin dependent DM with peripheral neuropathy, chronic: BG improved to 203 12/24. Most recent A1c 10.7 in 11/2017. Patient reports taking 20 units insulin in the am and 10 at night, metformin BID.  - mod SSI - monitor CBGs - HH/carb modified diet  Thrombocytopenia: platelets 75 on admission. Decreased from baseline but is normally low. 81-104 on last admission. No signs of excessive ecchymosis.  - CBC am - monitor for signs of bleeding - cautious anticoagulation - consider heme-onc  follow up OP  Hypokalemia, improved: K 3.4 > 3.6.  - repleted with 40 mEq x1 po - bmp am  Mild hyponatremia, unchanged: Na 133 > 132 on 12/24. Asymptomatic.  -continue to monitor -encourage PO fluid intake -AM BMP  FEN/GI: HH/carb modified diet  Prophylaxis: lovenox  Disposition: Admit to telemetry, attending Dr. [place] AmberSubjective:  Patient reports feeling sweaty but denies chills. Is no longer vomiting and was able to eat some this morning. Had a hard time sleeping due to pain in foot.   Objective: Temp:  [97.9 F (36.6 C)-103.9 F (39.9 C)] 97.9 F (36.6 C) (12/24 0247) Pulse Rate:  [65-147] 65 (12/24 0247) Resp:  [16-20] 18 (12/24 0247) BP: (93-154)/(58-95) 93/62 (12/24 0247) SpO2:  [96 %-100 %] 97 % (12/24 0247) Weight:  [65.5 kg-66.2 kg] 65.5 kg (12/23 1350)  Physical Exam Constitutional:      Appearance: He is not diaphoretic.  Cardiovascular:     Rate and Rhythm: Normal rate and regular rhythm.  Pulmonary:     Effort: Pulmonary effort is normal.     Breath sounds: Normal breath sounds.  Musculoskeletal:        General: Deformity (Right foot, infectious lesion acutely worse, DP and TP pulses not palpable to right foot) present.  Skin:    Capillary Refill: Capillary refill takes 2 to 3 seconds.  Neurological:     Mental Status: He is alert.   Laboratory: Recent Labs  Lab 01/16/18 1108 01/17/18 0332  WBC 7.6 2.9*  HGB 15.3 12.6*  HCT 44.6 35.3*  PLT 75* 55*   Recent Labs  Lab 01/16/18 1108 01/17/18 0332  NA 133* 132*  K 3.4* 3.6  CL  98 102  CO2 19* 22  BUN 13 14  CREATININE 0.97 0.77  CALCIUM 8.5* 7.8*  PROT 8.0  --   BILITOT 3.2*  --   ALKPHOS 94  --   ALT 50*  --   AST 75*  --   GLUCOSE 270* 266*   Lactic Acid: 5.57 > 1.42 > 1.9 > 1.8   Imaging/Diagnostic Tests: Xray R Foot (12/23): Stable remote postoperative changes, severe degenerative changes and fused joints. No definite acute destructive bony changes to suggest  osteomyelitis.  Daisy Floro, DO 01/17/2018, 8:32 AM PGY-1, [place] Intern pager: [phone], text pages welcome
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
## 2018-01-17 NOTE — Plan of Care (Signed)
  Problem: Clinical Measurements: Goal: Ability to maintain clinical measurements within normal limits will improve Outcome: Progressing   Problem: Clinical Measurements: Goal: Will remain free from infection Outcome: Progressing   Problem: Clinical Measurements: Goal: Cardiovascular complication will be avoided Outcome: Progressing   Problem: Activity: Goal: Risk for activity intolerance will decrease Outcome: Progressing

## 2018-01-17 NOTE — Consult Note (Signed)
Reason for Consult:  Chronic right lower extremity wounds Consulting Provider:  Family MedicineTeaching Service  The patient is a 42 year old male diabetic who was admitted yesterday due to fever and chills and feeling well at all for several days.  He has been dealing with chronic right lower extremity wounds for several years now.  He even was scheduled at one point for below-knee amputation by my partner Dr. Lajoyce Cornersuda but canceled this.  He has had multiple x-rays of his right foot recently as well as an MRI of the foot in October of this year.  This is show no evidence of osteomyelitis but has shown chronic changes.  Orthopedic surgery is consulted once again to assess the patient for the possibility of any further surgical intervention such as a below-knee amputation.  On exam the patient does not appear septic at all.  He says that he has been not eating well for several days and not feeling well.  His vital signs have been stable but he did have a spike in his temperature within the last 24 hours.  He has not had an increased white blood cell count however yesterday's level of 7.6 could be considered slightly elevated since he usually runs significantly lower.  On exam there is no cellulitis of his right lower extremity.  He has a chronic wound on the dorsum of his foot.  I was able to easily unroofed some eschar and found good bleeding soft tissue with no exposed bone and good granulation tissue.  There is no purulence that can be expressed from any wounds from his foot at all.  These all appear chronic and unchanged from previous notes that assess his foot.  I do not feel that he needs an urgent amputation in the setting currently and he would not agree to this.  He does not need an irrigation debridement of the wounds in his foot because I was able to easily remove any eschar at the bedside and found no purulence.  Again there is no cellulitis or evidence of an underlying abscess.  I do feel that he  needs close follow-up as an outpatient in our office once again with Dr. Lajoyce Cornersuda and he can easily be set up for an appointment next week as an outpatient.  Again, he does not need an urgent or emergent amputation or I&D during this hospitalization based on what I am seeing on his clinical exam.  Certainly at some point he may benefit from this type of surgical intervention if he would agree to it.

## 2018-01-18 LAB — BASIC METABOLIC PANEL
Anion gap: 8 (ref 5–15)
BUN: 11 mg/dL (ref 6–20)
CO2: 25 mmol/L (ref 22–32)
CREATININE: 0.72 mg/dL (ref 0.61–1.24)
Calcium: 8.1 mg/dL — ABNORMAL LOW (ref 8.9–10.3)
Chloride: 100 mmol/L (ref 98–111)
GFR calc Af Amer: >60 mL/min (ref >60)
GFR calc non Af Amer: >60 mL/min (ref >60)
Glucose, Bld: 362 mg/dL — ABNORMAL HIGH (ref 70–99)
Potassium: 3.8 mmol/L (ref 3.5–5.1)
Sodium: 133 mmol/L — ABNORMAL LOW (ref 135–145)

## 2018-01-18 LAB — URINALYSIS, ROUTINE W REFLEX MICROSCOPIC
Bacteria, UA: NONE SEEN
Bilirubin Urine: NEGATIVE
Glucose, UA: >=500 mg/dL — AB
Ketones, ur: NEGATIVE mg/dL
Leukocytes, UA: NEGATIVE
Nitrite: NEGATIVE
Protein, ur: NEGATIVE mg/dL
Specific Gravity, Urine: 1.011 (ref 1.005–1.030)
pH: 7 (ref 5.0–8.0)

## 2018-01-18 LAB — CBC
HCT: 34 % — ABNORMAL LOW (ref 39.0–52.0)
Hemoglobin: 12.1 g/dL — ABNORMAL LOW (ref 13.0–17.0)
MCH: 30.3 pg (ref 26.0–34.0)
MCHC: 35.6 g/dL (ref 30.0–36.0)
MCV: 85 fL (ref 80.0–100.0)
Platelets: 57 10*3/uL — ABNORMAL LOW (ref 150–400)
RBC: 4 MIL/uL — ABNORMAL LOW (ref 4.22–5.81)
RDW: 13.5 % (ref 11.5–15.5)
WBC: 2.8 10*3/uL — AB (ref 4.0–10.5)
nRBC: 0 % (ref 0.0–0.2)

## 2018-01-18 LAB — GLUCOSE, CAPILLARY
Glucose-Capillary: 329 mg/dL — ABNORMAL HIGH (ref 70–99)
Glucose-Capillary: 253 mg/dL — ABNORMAL HIGH (ref 70–99)
Glucose-Capillary: 111 mg/dL — ABNORMAL HIGH (ref 70–99)
Glucose-Capillary: 332 mg/dL — ABNORMAL HIGH (ref 70–99)

## 2018-01-18 LAB — PROTIME-INR
INR: 1.1
Prothrombin Time: 14.1 seconds (ref 11.4–15.2)

## 2018-01-18 LAB — SURGICAL PCR SCREEN
MRSA, PCR: NEGATIVE
Staphylococcus aureus: NEGATIVE

## 2018-01-18 NOTE — Progress Notes (Signed)
Family Medicine Teaching Service Daily Progress Note Intern Pager: [phone]  Patient name: Mitchell [name] Medical record number: [id number] Date of birth: [birth year] Age: 42 y.o. Gender: male  Primary Care Provider: Everrett Coombe, MD Consultants: Willia Craze Code Status: Full  Pt Overview and Major Events to Date:  01/16/2018 - admitted with sepsis secondary to R LE foot infection  Assessment and Plan: Mitchell [name] a 42 y.o.malepresenting with sepsis 2/2RLE foot infection.PMH is significant for DMII, GERD, depression,and chronic osteomyelitis of right foot.  Sepsis 2/2 Cellulitis R foot, acute, recurring issue: WBC count normal, BC growing Strep agalactiae (GBS). Temperature 97.21F on 12/24, controlled with tylenol. Urinalysis and culture pending, patient denies urinary symptoms. CBC showed normal results but patient has history of leukopenia so the WBC count of 7.6 > 2.8 back to within normal range. ABI w/in normal limits. ESR and CRP both elevated. Had R Foot MRI Oct 2019 that did not show any osteomyelitis. -Vascular surgery consulted - plan for arteriogram 01/19/2018, appreciate recs! -Ortho consulted - "patient does not need an irrigation debridement of foot wounds, no purulence, cellulitis or evidence of underlying abscess. Patient WOULD benefit from close follow-up as an outpatient, does NOT need urgent/emergent amputation or I&D this hospitalization." Appreciate recs! -continue Unasyn (12/24-), possibly switch to 2wks Augmentin on d/c -tylenol 1062m TID for pain and fever -Oxy 5 q4 PRN severe pain (patient is receiving every 4-6 hours) -Adding on Toradol [id number] PRN Pain -zofran PRN -f/u urinalysis/culture - needs collecting -Obtain R Foot MRI to r/o Osteomyelitis. -PT consulted -vitals perunitprotocol  Insulin dependent DM with peripheral neuropathy, chronic:BG 362 12/25. Most recent A1c 10.7 in 11/2017.Patientreports taking20 units insulin in the am and  10 at night, metformin BID.  - modSSI - Consider acting additional basal coverage -monitor CBGs -HH/carb modified diet  Thrombocytopenia:platelets 75 on admission. Decreased from baseline but is normally low. 81-104 on last admission. No signs of excessive ecchymosis.  - CBC am - monitor for signs of bleeding - cautious anticoagulation - consider heme-onc follow up OP  Hypokalemia, improved: K 3.4 > 3.6 > 3.8 on 01/18/2018. - repleted with 40 mEqx1 po - bmp am  Mild hyponatremia, stable: Na 132 > 133 on 12/25. Asymptomatic.  -continue to monitor -encourage PO fluid intake -AM BMP  FEN/GI:HH/carb modified diet Prophylaxis:lovenox  Disposition: Admit to telemetry, attending Dr. WaMingo AmberSubjective:  Patient seen this morning resting in bed.  He states he continues to have right foot pain and that Tylenol is not helpful.  He was informed he has Tylenol, oxycodone, and Toradol in his regimen for pain control.  Otherwise, he has no other concerns or complaints and denies fevers, chills, nausea, and vomiting.  Objective: Temp:  [98.1 F (36.7 C)-98.4 F (36.9 C)] 98.4 F (36.9 C) (12/25 0418) Pulse Rate:  [64-70] 70 (12/25 0418) Resp:  [16-18] 16 (12/25 0418) BP: (94-114)/(64-75) 114/75 (12/25 0418) SpO2:  [96 %-100 %] 96 % (12/25 0418)  Physical Exam Constitutional:      Appearance: He is not diaphoretic.  Cardiovascular:     Rate and Rhythm: Normal rate and regular rhythm.  Pulmonary:     Effort: Pulmonary effort is normal.     Breath sounds: Normal breath sounds.  Musculoskeletal:        General: Deformity (Right foot, infectious lesion acutely worse, DP and TP pulses not palpable to right foot) present.  Skin:    Capillary Refill: Capillary refill takes 2 to 3 seconds.  Neurological:  Mental Status: He is alert.   Laboratory: Recent Labs  Lab 01/16/18 1108 01/17/18 0332 01/18/18 0315  WBC 7.6 2.9* 2.8*  HGB 15.3 12.6* 12.1*  HCT 44.6 35.3*  34.0*  PLT 75* 55* 57*   Recent Labs  Lab 01/16/18 1108 01/17/18 0332 01/18/18 0315  NA 133* 132* 133*  K 3.4* 3.6 3.8  CL 98 102 100  CO2 19* 22 25  BUN '13 14 11  ' CREATININE 0.97 0.77 0.72  CALCIUM 8.5* 7.8* 8.1*  PROT 8.0  --   --   BILITOT 3.2*  --   --   ALKPHOS 94  --   --   ALT 50*  --   --   AST 75*  --   --   GLUCOSE 270* 266* 362*   CRP 5.7 ESR 28 INR: 1.10  Imaging/Diagnostic Tests: Vas [place] Abi With/wo Tbi: 01/17/2018 LOWER EXTREMITY DOPPLER STUDY Indications: Open wound on the dorsal foot and plantar foot.  Comparison Study: Comparrison study available for viewing from 03/25/2015 Performing Technologist: Birdena Crandall, [place]  Examination Guidelines: A complete evaluation includes at minimum, Doppler waveform signals and systolic blood pressure reading at the level of bilateral brachial, anterior tibial, and posterior tibial arteries, when vessel segments are accessible. Bilateral testing is considered an integral part of a complete examination. Photoelectric Plethysmograph (PPG) waveforms and toe systolic pressure readings are included as required and additional duplex testing as needed. Limited examinations for reoccurring indications may be performed as noted.  ABI Findings: +--------+-----------------+-----+---------+----------------------------------+ Right   Rt Pressure      IndexWaveform Comment                                    (mmHg)                                                            +--------+-----------------+-----+---------+----------------------------------+ [id number]                   triphasic                                   +--------+-----------------+-----+---------+----------------------------------+ ATA     113              1.09 triphasic                                   +--------+-----------------+-----+---------+----------------------------------+ PTA     122              1.17 triphasic                                    +--------+-----------------+-----+---------+----------------------------------+ DP                                     Unable to evaluate due to open  wound                              +--------+-----------------+-----+---------+----------------------------------+ +--------+------------------+-----+---------+-------+ Left    Lt Pressure (mmHg)IndexWaveform Comment +--------+------------------+-----+---------+-------+ [id number]                    triphasic        +--------+------------------+-----+---------+-------+ PTA     128               1.23 triphasic        +--------+------------------+-----+---------+-------+ DP      113               1.09 triphasic        +--------+------------------+-----+---------+-------+ +-------+-----------+-----------+------------+------------+ ABI/TBIToday's ABIToday's TBIPrevious ABIPrevious TBI +-------+-----------+-----------+------------+------------+ Right  1.17                  1.10                     +-------+-----------+-----------+------------+------------+ Left   1.23                  1.14                     +-------+-----------+-----------+------------+------------+ Unable to evaluate the right great toe which was the area of interest. 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  Summary: Right: Resting right ankle-brachial index is within normal range. No evidence of significant right lower extremity arterial disease. Left: Resting left ankle-brachial index is within normal range. No evidence of significant left lower extremity arterial disease.  *See table(s) above for measurements and observations.     Preliminary    Daisy Floro, DO 01/18/2018, 8:51 AM PGY-1, [facility] Intern pager: [phone], text pages welcome

## 2018-01-18 NOTE — Plan of Care (Signed)
  Problem: Pain Managment: Goal: General experience of comfort will improve Outcome: Progressing   Problem: Safety: Goal: Ability to remain free from injury will improve Outcome: Progressing   

## 2018-01-19 ENCOUNTER — Encounter (HOSPITAL_COMMUNITY): Payer: Self-pay | Admitting: Vascular Surgery

## 2018-01-19 ENCOUNTER — Encounter (HOSPITAL_COMMUNITY): Admission: EM | Disposition: A | Discharge status: Home Health | Payer: Self-pay | Source: Home / Self Care

## 2018-01-19 HISTORY — PX: LOWER EXTREMITY ANGIOGRAPHY: CATH118251

## 2018-01-19 HISTORY — PX: ABDOMINAL AORTOGRAM: CATH118222

## 2018-01-19 LAB — CULTURE, BLOOD (ROUTINE X 2): SPECIAL REQUESTS: ADEQUATE

## 2018-01-19 LAB — BASIC METABOLIC PANEL
Anion gap: 8 (ref 5–15)
BUN: 5 mg/dL — ABNORMAL LOW (ref 6–20)
CHLORIDE: 100 mmol/L (ref 98–111)
CO2: 29 mmol/L (ref 22–32)
Calcium: 8.6 mg/dL — ABNORMAL LOW (ref 8.9–10.3)
Creatinine, Ser: 0.67 mg/dL (ref 0.61–1.24)
GFR calc Af Amer: >60 mL/min (ref >60)
GFR calc non Af Amer: >60 mL/min (ref >60)
Glucose, Bld: 239 mg/dL — ABNORMAL HIGH (ref 70–99)
Potassium: 3.4 mmol/L — ABNORMAL LOW (ref 3.5–5.1)
SODIUM: 137 mmol/L (ref 135–145)

## 2018-01-19 LAB — RETICULOCYTES
Immature Retic Fract: 7.9 % (ref 2.3–15.9)
RBC.: 3.94 MIL/uL — ABNORMAL LOW (ref 4.22–5.81)
Retic Count, Absolute: 86.3 10*3/uL (ref 19.0–186.0)
Retic Ct Pct: 2.2 % (ref 0.4–3.1)

## 2018-01-19 LAB — CBC
HCT: 34 % — ABNORMAL LOW (ref 39.0–52.0)
Hemoglobin: 12.2 g/dL — ABNORMAL LOW (ref 13.0–17.0)
MCH: 30.9 pg (ref 26.0–34.0)
MCHC: 35.9 g/dL (ref 30.0–36.0)
MCV: 86.1 fL (ref 80.0–100.0)
Platelets: 67 10*3/uL — ABNORMAL LOW (ref 150–400)
RBC: 3.95 MIL/uL — ABNORMAL LOW (ref 4.22–5.81)
RDW: 13.3 % (ref 11.5–15.5)
WBC: 2.5 10*3/uL — ABNORMAL LOW (ref 4.0–10.5)
nRBC: 0 % (ref 0.0–0.2)

## 2018-01-19 LAB — GLUCOSE, CAPILLARY
Glucose-Capillary: 170 mg/dL — ABNORMAL HIGH (ref 70–99)
Glucose-Capillary: 211 mg/dL — ABNORMAL HIGH (ref 70–99)
Glucose-Capillary: 308 mg/dL — ABNORMAL HIGH (ref 70–99)
Glucose-Capillary: 352 mg/dL — ABNORMAL HIGH (ref 70–99)

## 2018-01-19 LAB — SAVE SMEAR(SSMR), FOR PROVIDER SLIDE REVIEW

## 2018-01-19 SURGERY — LOWER EXTREMITY ANGIOGRAPHY
Anesthesia: LOCAL | Laterality: Right

## 2018-01-19 MED ORDER — POTASSIUM CHLORIDE CRYS ER 20 MEQ PO TBCR
40.0000 meq | EXTENDED_RELEASE_TABLET | Freq: Two times a day (BID) | ORAL | Status: AC
  Administered 2018-01-19 (×2): 40 meq via ORAL
  Filled 2018-01-19 (×2): qty 2

## 2018-01-19 MED ORDER — LIDOCAINE HCL (PF) 1 % IJ SOLN
INTRAMUSCULAR | Status: AC
  Filled 2018-01-19: qty 30

## 2018-01-19 MED ORDER — IODIXANOL 320 MG/ML IV SOLN
INTRAVENOUS | Status: DC | PRN
  Administered 2018-01-19: 80 mL via INTRAVENOUS

## 2018-01-19 MED ORDER — SODIUM CHLORIDE 0.9% FLUSH: 3.0000 mL | INTRAVENOUS | Status: DC | PRN

## 2018-01-19 MED ORDER — SODIUM CHLORIDE 0.9% FLUSH
3.0000 mL | Freq: Two times a day (BID) | INTRAVENOUS | Status: DC
  Administered 2018-01-19 – 2018-01-21 (×3): 3 mL via INTRAVENOUS

## 2018-01-19 MED ORDER — ONDANSETRON HCL 4 MG/2ML IJ SOLN: 4.0000 mg | Freq: Four times a day (QID) | INTRAMUSCULAR | Status: DC | PRN

## 2018-01-19 MED ORDER — INSULIN ASPART PROT & ASPART (70-30 MIX) 100 UNIT/ML ~~LOC~~ SUSP
20.0000 [IU] | Freq: Every day | SUBCUTANEOUS | Status: DC
  Administered 2018-01-20 – 2018-01-21 (×2): 20 [IU] via SUBCUTANEOUS
  Filled 2018-01-19: qty 10

## 2018-01-19 MED ORDER — HEPARIN (PORCINE) IN NACL 1000-0.9 UT/500ML-% IV SOLN
INTRAVENOUS | Status: DC | PRN
  Administered 2018-01-19 (×2): 500 mL

## 2018-01-19 MED ORDER — SODIUM CHLORIDE 0.9 % IV SOLN: 250.0000 mL | INTRAVENOUS | Status: DC | PRN

## 2018-01-19 MED ORDER — ACETAMINOPHEN 325 MG PO TABS: 650.0000 mg | ORAL_TABLET | ORAL | Status: DC | PRN

## 2018-01-19 MED ORDER — INSULIN ASPART 100 UNIT/ML ~~LOC~~ SOLN
10.0000 [IU] | Freq: Once | SUBCUTANEOUS | Status: AC
  Administered 2018-01-19: 10 [IU] via SUBCUTANEOUS

## 2018-01-19 MED ORDER — SODIUM CHLORIDE 0.9 % WEIGHT BASED INFUSION: 1.0000 mL/kg/h | INTRAVENOUS | Status: AC

## 2018-01-19 MED ORDER — HYDRALAZINE HCL 20 MG/ML IJ SOLN: 5.0000 mg | INTRAMUSCULAR | Status: DC | PRN

## 2018-01-19 MED ORDER — SODIUM CHLORIDE 0.9 % IV SOLN
INTRAVENOUS | Status: DC
  Administered 2018-01-19 – 2018-01-20 (×5): via INTRAVENOUS

## 2018-01-19 MED ORDER — AMOXICILLIN 500 MG PO CAPS
500.0000 mg | ORAL_CAPSULE | Freq: Three times a day (TID) | ORAL | Status: DC
  Administered 2018-01-19 – 2018-01-21 (×7): 500 mg via ORAL
  Filled 2018-01-19 (×8): qty 1

## 2018-01-19 MED ORDER — LIDOCAINE HCL (PF) 1 % IJ SOLN
INTRAMUSCULAR | Status: DC | PRN
  Administered 2018-01-19: 12 mL

## 2018-01-19 MED ORDER — HEPARIN (PORCINE) IN NACL 1000-0.9 UT/500ML-% IV SOLN
INTRAVENOUS | Status: AC
  Filled 2018-01-19: qty 1000

## 2018-01-19 MED ORDER — LABETALOL HCL 5 MG/ML IV SOLN: 10.0000 mg | INTRAVENOUS | Status: DC | PRN

## 2018-01-19 MED ORDER — INSULIN ASPART PROT & ASPART (70-30 MIX) 100 UNIT/ML ~~LOC~~ SUSP
10.0000 [IU] | Freq: Every day | SUBCUTANEOUS | Status: DC
  Administered 2018-01-19 – 2018-01-20 (×2): 10 [IU] via SUBCUTANEOUS
  Filled 2018-01-19: qty 10

## 2018-01-19 SURGICAL SUPPLY — 12 items
CATH CROSS OVER TEMPO 5F (CATHETERS) ×3 IMPLANT
CATH OMNI FLUSH 5F 65CM (CATHETERS) ×3 IMPLANT
CATH STRAIGHT 5FR 65CM (CATHETERS) ×6 IMPLANT
CLOSURE MYNX CONTROL 5F (Vascular Products) ×3 IMPLANT
KIT MICROPUNCTURE NIT STIFF (SHEATH) ×3 IMPLANT
KIT PV (KITS) ×3 IMPLANT
SHEATH PINNACLE 5F 10CM (SHEATH) ×3 IMPLANT
SHEATH PROBE COVER 6X72 (BAG) ×3 IMPLANT
SYR MEDRAD MARK V 150ML (SYRINGE) ×3 IMPLANT
TRANSDUCER W/STOPCOCK (MISCELLANEOUS) ×3 IMPLANT
TRAY PV CATH (CUSTOM PROCEDURE TRAY) ×3 IMPLANT
WIRE BENTSON .035X145CM (WIRE) ×3 IMPLANT

## 2018-01-19 NOTE — Progress Notes (Addendum)
Family medicine contacted about patients blood sugar being in the 300's. Advised that patient would be started on basal coverage in the am, and no action needed to be taken at the moment. Patient is urinating frequently, otherwise asymptomatic. Will continue to monitor.
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
## 2018-01-19 NOTE — Progress Notes (Signed)
Inpatient Diabetes Program Recommendations  AACE/ADA: New Consensus Statement on Inpatient Glycemic Control (2015)  Target Ranges:  Prepandial:   less than 140 mg/dL      Peak postprandial:   less than 180 mg/dL (1-2 hours)      Critically ill patients:  140 - 180 mg/dL   Lab Results  Component Value Date   GLUCAP 170 (H) 01/19/2018   HGBA1C 10.7 (H) 12/22/2017    Review of Glycemic Control Results for Mitchell [name], Mitchell [name] (MRN [id number]) as of 01/19/2018 12:58  Ref. Range 01/18/2018 06:32 01/18/2018 11:09 01/18/2018 16:03 01/18/2018 21:36 01/19/2018 00:28 01/19/2018 06:51  Glucose-Capillary Latest Ref Range: 70 - 99 mg/dL 409329 (H) 811253 (H) 914111 (H) 332 (H) 352 (H) 170 (H)   Diabetes history: DM 2 Outpatient Diabetes medications: Novolog 70/30 20 units with breakfast and 10 units with supper, Metformin 1000 mg bid Current orders for Inpatient glycemic control:  Novolog moderate tid with meals   Inpatient Diabetes Program Recommendations:  Note that patient is on premixed insulin at home.  While in the hospital, consider adding Lantus 15 units daily.  Also consider changing diet to CHO modified and add Novolog meal coverage 3 units tid with meals (hold if patient eats less than 50%).   Thanks,  Beryl MeagerJenny Vlad Mayberry, RN, BC-ADM Inpatient Diabetes Coordinator Pager [phone] (8a-5p)

## 2018-01-19 NOTE — Progress Notes (Signed)
Patient was educated on not to eat before midnight, but family came & he was snacking on chips when I went into room. Contacted vascular MD to inform about situation and he stated that it would be okay.
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
## 2018-01-20 ENCOUNTER — Inpatient Hospital Stay (HOSPITAL_COMMUNITY): Payer: Self-pay

## 2018-01-20 ENCOUNTER — Encounter (HOSPITAL_COMMUNITY): Payer: Self-pay | Admitting: Radiology

## 2018-01-20 LAB — CBC WITH DIFFERENTIAL/PLATELET
Abs Immature Granulocytes: 0 10*3/uL (ref 0.00–0.07)
BASOS PCT: 1 %
Basophils Absolute: 0 10*3/uL (ref 0.0–0.1)
Eosinophils Absolute: 0.1 10*3/uL (ref 0.0–0.5)
Eosinophils Relative: 2 %
HCT: 35.8 % — ABNORMAL LOW (ref 39.0–52.0)
Hemoglobin: 12.6 g/dL — ABNORMAL LOW (ref 13.0–17.0)
Immature Granulocytes: 0 %
Lymphocytes Relative: 36 %
Lymphs Abs: 1.1 10*3/uL (ref 0.7–4.0)
MCH: 31 pg (ref 26.0–34.0)
MCHC: 35.2 g/dL (ref 30.0–36.0)
MCV: 88.2 fL (ref 80.0–100.0)
Monocytes Absolute: 0.2 10*3/uL (ref 0.1–1.0)
Monocytes Relative: 8 %
NEUTROS ABS: 1.6 10*3/uL — AB (ref 1.7–7.7)
Neutrophils Relative %: 53 %
Platelets: 83 10*3/uL — ABNORMAL LOW (ref 150–400)
RBC: 4.06 MIL/uL — ABNORMAL LOW (ref 4.22–5.81)
RDW: 13.4 % (ref 11.5–15.5)
WBC: 3 10*3/uL — ABNORMAL LOW (ref 4.0–10.5)
nRBC: 0 % (ref 0.0–0.2)

## 2018-01-20 LAB — BASIC METABOLIC PANEL
Anion gap: 7 (ref 5–15)
BUN: 9 mg/dL (ref 6–20)
CALCIUM: 8.6 mg/dL — AB (ref 8.9–10.3)
CO2: 30 mmol/L (ref 22–32)
Chloride: 99 mmol/L (ref 98–111)
Creatinine, Ser: 0.63 mg/dL (ref 0.61–1.24)
GFR calc Af Amer: >60 mL/min (ref >60)
Glucose, Bld: 230 mg/dL — ABNORMAL HIGH (ref 70–99)
Potassium: 4.2 mmol/L (ref 3.5–5.1)
Sodium: 136 mmol/L (ref 135–145)

## 2018-01-20 LAB — GLUCOSE, CAPILLARY
Glucose-Capillary: 183 mg/dL — ABNORMAL HIGH (ref 70–99)
Glucose-Capillary: 198 mg/dL — ABNORMAL HIGH (ref 70–99)
Glucose-Capillary: 169 mg/dL — ABNORMAL HIGH (ref 70–99)
Glucose-Capillary: 292 mg/dL — ABNORMAL HIGH (ref 70–99)
Glucose-Capillary: 279 mg/dL — ABNORMAL HIGH (ref 70–99)

## 2018-01-20 LAB — URINE CULTURE
Culture: NO GROWTH
Special Requests: NORMAL

## 2018-01-20 LAB — HIV ANTIBODY (ROUTINE TESTING W REFLEX): HIV Screen 4th Generation wRfx: NONREACTIVE

## 2018-01-20 MED ORDER — RAMELTEON 8 MG PO TABS
8.0000 mg | ORAL_TABLET | Freq: Once | ORAL | Status: AC
  Administered 2018-01-21: 8 mg via ORAL
  Filled 2018-01-20: qty 1

## 2018-01-20 MED ORDER — ASPIRIN EC 81 MG PO TBEC
81.0000 mg | DELAYED_RELEASE_TABLET | Freq: Every day | ORAL | Status: DC
  Administered 2018-01-20 – 2018-01-21 (×2): 81 mg via ORAL
  Filled 2018-01-20 (×2): qty 1

## 2018-01-20 MED ORDER — ATORVASTATIN CALCIUM 40 MG PO TABS
40.0000 mg | ORAL_TABLET | Freq: Every day | ORAL | Status: DC
  Administered 2018-01-20 – 2018-01-21 (×2): 40 mg via ORAL
  Filled 2018-01-20 (×2): qty 1

## 2018-01-20 MED ORDER — TRAMADOL HCL 50 MG PO TABS
100.0000 mg | ORAL_TABLET | Freq: Four times a day (QID) | ORAL | Status: DC | PRN
  Administered 2018-01-21: 100 mg via ORAL
  Filled 2018-01-20 (×2): qty 2

## 2018-01-20 MED ORDER — TRAMADOL HCL 50 MG PO TABS
50.0000 mg | ORAL_TABLET | Freq: Once | ORAL | Status: AC
  Administered 2018-01-20: 50 mg via ORAL
  Filled 2018-01-20: qty 1

## 2018-01-20 MED ORDER — TRAMADOL HCL 50 MG PO TABS
50.0000 mg | ORAL_TABLET | Freq: Four times a day (QID) | ORAL | Status: DC | PRN
  Administered 2018-01-20 (×2): 50 mg via ORAL
  Filled 2018-01-20 (×2): qty 1

## 2018-01-20 NOTE — Progress Notes (Signed)
PT Cancellation Note  Patient Details Name: Mitchell [name] MRN: [id number] DOB: 04/06/1975   Cancelled Treatment:    Reason Eval/Treat Not Completed: Patient at procedure or test/unavailable  (MRI).  [name], PT, DPT Acute Rehabilitation Services Pager [phone] Office [phone]   Vanetta MuldersCarloine H Briah Nary 01/20/2018, 2:46 PM
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
## 2018-01-20 NOTE — Progress Notes (Signed)
Family Medicine Teaching Service Daily Progress Note Intern Pager: [phone]  Patient name: Mitchell [name] Medical record number: [id number] Date of birth: 1976-01-01 Age: 42 y.o. Gender: male  Primary Care Provider: Everrett Coombe, MD Consultants: Willia Craze Code Status: Full  Pt Overview and Major Events to Date:  01/16/2018 - admitted with sepsis secondary to R LE foot infection  Assessment and Plan: Mitchell [name] a 42 y.o.malepresenting with sepsis 2/2GBS bacteremia.PMH is significant for DMII, GERD, pancytopenia with ?previous history of leukemia, and depression.  Sepsis 2/2 GBS bacteremia: Acute, improving.   VSS, afebrile since 12/23.  Leukocytosis resolved.  BC 1/2 growing strep agalactiae, pansensitive.  Repeat BC pending.  Foot wound is likely the source of bacteremia, will obtain right foot MRI today to rule out osteomyelitis.  - Continue p.o. amoxicillin, to continue for likely an additional 7 days - Tylenol scheduled (mainly for pain as below), Zofran as needed - Follow-up repeat blood cultures - Right foot MRI - Vitals per routine  Chronic right foot wound, in setting of previous crush and burn injuries: Progressive.   Darkened appearance of foot, open wound on dorsal surface with pink tissue without any signs of infection. ABI WNL. Arteriogram performed yesterday showing patent large vessels, with distal occlusion of the anterior tibial vessel that would be non-amenable to reconstruction. Orthopedics on board, not recommending any urgent amputation or debridement during this hospitalization.  - Vascular signed off, defer to Ortho for any additional interventions -Obtain right foot MRI -Reach out to orthopedics following MRI results - Transition to tramadol 54m every 6 as needed -Continue Tylenol 10082m3 times daily  - PT not recommending any PT follow-up  Chronic pancytopenia: Worsening.  WBC 3.0, hemoglobin 12.6, platelets 83.  ANC 1.6 today.   Pathologist review of smear is pending.  Retic count appropriate.  Intermittent pancytopenia for the last several years, note liver cirrhosis and splenomegaly on CT abdomen back in 2013. Has not seen a hematologist, was referred at last admission but never received a call. -Monitor CBC with differential in the am  -Follow-up peripheral smear -Will need hematology oncology follow-up outpatient and likely GI  Insulin dependent DM with peripheral neuropathy: Chronic, uncontrolled.  A1c 10.7 in 11/2017.  Started back on his 70/30 regimen yesterday, CBG improved 180-200.  Required 14 units of SSI yesterday. -Continue with moderate SSI -Start his home 70/30 regimen- 20 in the am, 10 in the pm -We will monitor CBGs today, likely titrate up tomorrow if uncontrolled - CBGs before meals and nightly  -Holding home metformin with continued procedures during hospitalization -Continued diabetes education  Hypokalemia: Resolved. K4.2, from 3.4 yesterday.  -Monitor BMP  Hyponatremia: Resolved.  Na 136.  -Monitor BMP -Encourage p.o. fluid intake  FEN/GI:HH/carb modified diet Prophylaxis:lovenox  Disposition:  Continued evaluation for bacteremia  Subjective:  Patient doing well this morning, states he continues to have foot pain.  Normally his foot pain is around about a 5, however since admission it has been about a 7. However does feel like he can move his for more than he could before.  Otherwise, denies any chest pain, shortness of breath, leg pain, or palpitations.  Objective: Temp:  [97.9 F (36.6 C)-98.3 F (36.8 C)] 98.2 F (36.8 C) (12/27 0310) Pulse Rate:  [62-68] 65 (12/26 1129) Resp:  [11-19] 17 (12/27 0310) BP: (97-126)/(63-78) 104/72 (12/27 0310) SpO2:  [0 %-99 %] 99 % (12/27 0310)  Physical Exam: General: Alert, NAD HEENT: NCAT, MMM, oropharynx nonerythematous  Cardiac: RRR no m/g/r  Lungs: Clear bilaterally, no increased WOB  Abdomen: soft, non-tender, non-distended,  normoactive BS Msk: Moves all extremities spontaneously  Ext: Warm, dry, 2+ distal pulses, no edema. See right foot images as below, minimally palpable posterior tibialis pulse on the right, could not appreciate dorsalis pedis pulse.      Laboratory: Recent Labs  Lab 01/18/18 0315 01/19/18 0305 01/20/18 0331  WBC 2.8* 2.5* 3.0*  HGB 12.1* 12.2* 12.6*  HCT 34.0* 34.0* 35.8*  PLT 57* 67* 83*   Recent Labs  Lab 01/16/18 1108  01/18/18 0315 01/19/18 0305 01/20/18 0331  NA 133*   < > 133* 137 136  K 3.4*   < > 3.8 3.4* 4.2  CL 98   < > 100 100 99  CO2 19*   < > _0 BUN 13   < > 11 5* 9  CREATININE 0.97   < > 0.72 0.67 0.63  CALCIUM 8.5*   < > 8.1* 8.6* 8.6*  PROT 8.0  --   --   --   --   BILITOT 3.2*  --   --   --   --   ALKPHOS 94  --   --   --   --   ALT 50*  --   --   --   --   AST 75*  --   --   --   --   GLUCOSE 270*   < > 362* 239* 230*   < > = values in this interval not displayed.   CRP 5.7 ESR 28 INR: 1.10  Imaging/Diagnostic Tests: Vas [place] Abi With/wo Tbi: 01/17/2018 LOWER EXTREMITY DOPPLER STUDY Indications: Open wound on the dorsal foot and plantar foot.  Comparison Study: Comparrison study available for viewing from 03/25/2015 Performing Technologist: Birdena Crandall, [place]  Examination Guidelines: A complete evaluation includes at minimum, Doppler waveform signals and systolic blood pressure reading at the level of bilateral brachial, anterior tibial, and posterior tibial arteries, when vessel segments are accessible. Bilateral testing is considered an integral part of a complete examination. Photoelectric Plethysmograph (PPG) waveforms and toe systolic pressure readings are included as required and additional duplex testing as needed. Limited examinations for reoccurring indications may be performed as noted.  ABI Findings: +--------+-----------------+-----+---------+----------------------------------+ Right   Rt Pressure      IndexWaveform Comment                                     (mmHg)                                                            +--------+-----------------+-----+---------+----------------------------------+ [id number]                   triphasic                                   +--------+-----------------+-----+---------+----------------------------------+ ATA     113              1.09 triphasic                                   +--------+-----------------+-----+---------+----------------------------------+  PTA     122              1.17 triphasic                                   +--------+-----------------+-----+---------+----------------------------------+ DP                                     Unable to evaluate due to open                                            wound                              +--------+-----------------+-----+---------+----------------------------------+ +--------+------------------+-----+---------+-------+ Left    Lt Pressure (mmHg)IndexWaveform Comment +--------+------------------+-----+---------+-------+ Brachial102                    triphasic        +--------+------------------+-----+---------+-------+ PTA     128               1.23 triphasic        +--------+------------------+-----+---------+-------+ DP      113               1.09 triphasic        +--------+------------------+-----+---------+-------+ +-------+-----------+-----------+------------+------------+ ABI/TBIToday's ABIToday's TBIPrevious ABIPrevious TBI +-------+-----------+-----------+------------+------------+ Right  1.17                  1.10                     +-------+-----------+-----------+------------+------------+ Left   1.23                  1.14                     +-------+-----------+-----------+------------+------------+ Unable to evaluate the right great toe which was the area of interest. Toe had been amputated and all other digits could not be evaluated  due to anatomy.  Summary: Right: Resting right ankle-brachial index is within normal range. No evidence of significant right lower extremity arterial disease. Left: Resting left ankle-brachial index is within normal range. No evidence of significant left lower extremity arterial disease.  *See table(s) above for measurements and observations.     Preliminary    Patriciaann Clan, DO 01/20/2018, 7:01 AM PGY-1, [place] Intern pager: [phone], text pages welcome

## 2018-01-20 NOTE — Progress Notes (Signed)
    Subjective  - POD #1  A little amount of pain in left groin   Physical Exam:  Left groin soft without hematoma palpable right PT pulse       Assessment/Plan:  POD #1  Angio reveals 2 vessel runoff to the ankle.  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   His blood flow is as good as it will be.  Will defer to Ortho for management of foot wounds.  Please call with any other concerns or questions.  I will sign off. Wells  01/20/2018 7:51 AM --  Vitals:   01/19/18 2112 01/20/18 0310  BP: 97/63 104/72  Pulse:    Resp:  17  Temp: 98.3 F (36.8 C) 98.2 F (36.8 C)  SpO2: 94% 99%    Intake/Output Summary (Last 24 hours) at 01/20/2018 0751 Last data filed at 01/20/2018 0602 Gross per 24 hour  Intake 1709.47 ml  Output 2175 ml  Net -465.53 ml     Laboratory CBC    Component Value Date/Time   WBC 3.0 (L) 01/20/2018 0331   HGB 12.6 (L) 01/20/2018 0331   HCT 35.8 (L) 01/20/2018 0331   PLT 83 (L) 01/20/2018 0331    BMET    Component Value Date/Time   NA 136 01/20/2018 0331   NA 134 11/25/2017 1548   K 4.2 01/20/2018 0331   CL 99 01/20/2018 0331   CO2 30 01/20/2018 0331   GLUCOSE 230 (H) 01/20/2018 0331   BUN 9 01/20/2018 0331   BUN 10 11/25/2017 1548   CREATININE 0.63 01/20/2018 0331   CREATININE 0.65 12/10/2015 0924   CALCIUM 8.6 (L) 01/20/2018 0331   GFRNONAA >60 01/20/2018 0331   GFRNONAA >89 12/10/2015 0924   GFRAA >60 01/20/2018 0331   GFRAA >89 12/10/2015 0924    COAG Lab Results  Component Value Date   INR 1.10 01/18/2018   INR 1.25 02/26/2017   INR 1.25 01/21/2016   No results found for: PTT  Antibiotics Anti-infectives (From admission, onward)   Start     Dose/Rate Route Frequency Ordered Stop   01/19/18 1430  amoxicillin (AMOXIL) capsule 500 mg     500 mg Oral Every 8 hours 01/19/18 1359     01/17/18 0630  Ampicillin-Sulbactam (UNASYN) 3 g in sodium chloride 0.9 % 100 mL IVPB  Status:  Discontinued     3 g 200 mL/hr over 30 Minutes Intravenous Every 8 hours 01/17/18 0622 01/19/18 1359   01/17/18 0000  vancomycin (VANCOCIN) IVPB 750 mg/150 ml premix  Status:  Discontinued     750 mg 150 mL/hr over 60 Minutes Intravenous Every 12 hours 01/16/18 1326 01/17/18 0620   01/16/18 1115  vancomycin (VANCOCIN) IVPB 1000 mg/200 mL premix     1,000 mg 200 mL/hr over 60 Minutes Intravenous  Once 01/16/18 1100 01/16/18 1234   01/16/18 1115  cefTRIAXone (ROCEPHIN) 2 g in sodium chloride 0.9 % 100 mL IVPB  Status:  Discontinued     2 g 200 mL/hr over 30 Minutes Intravenous Every 24 hours 01/16/18 1100 01/17/18 0620       V. Charlena CrossWells  IV, M.D. [facility] Office: [phone] Pager:  [phone]

## 2018-01-21 DIAGNOSIS — E118 Type 2 diabetes mellitus with unspecified complications: Secondary | ICD-10-CM

## 2018-01-21 DIAGNOSIS — A401 Sepsis due to streptococcus, group B: Principal | ICD-10-CM

## 2018-01-21 DIAGNOSIS — R6520 Severe sepsis without septic shock: Secondary | ICD-10-CM

## 2018-01-21 LAB — COMPREHENSIVE METABOLIC PANEL
ALT: 36 U/L (ref 0–44)
AST: 47 U/L — ABNORMAL HIGH (ref 15–41)
Albumin: 2.8 g/dL — ABNORMAL LOW (ref 3.5–5.0)
Alkaline Phosphatase: 144 U/L — ABNORMAL HIGH (ref 38–126)
Anion gap: 10 (ref 5–15)
BILIRUBIN TOTAL: 0.6 mg/dL (ref 0.3–1.2)
BUN: 11 mg/dL (ref 6–20)
CO2: 26 mmol/L (ref 22–32)
Calcium: 8.4 mg/dL — ABNORMAL LOW (ref 8.9–10.3)
Chloride: 99 mmol/L (ref 98–111)
Creatinine, Ser: 0.67 mg/dL (ref 0.61–1.24)
GFR calc Af Amer: >60 mL/min (ref >60)
GFR calc non Af Amer: >60 mL/min (ref >60)
GLUCOSE: 339 mg/dL — AB (ref 70–99)
Potassium: 4 mmol/L (ref 3.5–5.1)
Sodium: 135 mmol/L (ref 135–145)
TOTAL PROTEIN: 6.1 g/dL — AB (ref 6.5–8.1)

## 2018-01-21 LAB — CULTURE, BLOOD (ROUTINE X 2): Culture: NO GROWTH

## 2018-01-21 LAB — CBC WITH DIFFERENTIAL/PLATELET
Abs Immature Granulocytes: 0.01 10*3/uL (ref 0.00–0.07)
Basophils Absolute: 0 10*3/uL (ref 0.0–0.1)
Basophils Relative: 1 %
Eosinophils Absolute: 0 10*3/uL (ref 0.0–0.5)
Eosinophils Relative: 2 %
HCT: 34.1 % — ABNORMAL LOW (ref 39.0–52.0)
Hemoglobin: 11.7 g/dL — ABNORMAL LOW (ref 13.0–17.0)
Immature Granulocytes: 0 %
Lymphocytes Relative: 37 %
Lymphs Abs: 0.9 10*3/uL (ref 0.7–4.0)
MCH: 30.1 pg (ref 26.0–34.0)
MCHC: 34.3 g/dL (ref 30.0–36.0)
MCV: 87.7 fL (ref 80.0–100.0)
MONOS PCT: 11 %
Monocytes Absolute: 0.3 10*3/uL (ref 0.1–1.0)
Neutro Abs: 1.2 10*3/uL — ABNORMAL LOW (ref 1.7–7.7)
Neutrophils Relative %: 49 %
Platelets: 84 10*3/uL — ABNORMAL LOW (ref 150–400)
RBC: 3.89 MIL/uL — ABNORMAL LOW (ref 4.22–5.81)
RDW: 13.3 % (ref 11.5–15.5)
WBC: 2.4 10*3/uL — ABNORMAL LOW (ref 4.0–10.5)
nRBC: 0 % (ref 0.0–0.2)

## 2018-01-21 LAB — GLUCOSE, CAPILLARY
Glucose-Capillary: 212 mg/dL — ABNORMAL HIGH (ref 70–99)
Glucose-Capillary: 139 mg/dL — ABNORMAL HIGH (ref 70–99)
Glucose-Capillary: 256 mg/dL — ABNORMAL HIGH (ref 70–99)

## 2018-01-21 LAB — URINE CULTURE: Culture: NO GROWTH

## 2018-01-21 MED ORDER — ATORVASTATIN CALCIUM 40 MG PO TABS: 40.0000 mg | ORAL_TABLET | Freq: Every day | ORAL | 1 refills | Status: DC

## 2018-01-21 MED ORDER — TRAMADOL HCL 50 MG PO TABS
100.0000 mg | ORAL_TABLET | Freq: Two times a day (BID) | ORAL | Status: DC | PRN
  Administered 2018-01-21: 100 mg via ORAL

## 2018-01-21 MED ORDER — TRAMADOL HCL 50 MG PO TABS: 100.0000 mg | ORAL_TABLET | Freq: Two times a day (BID) | ORAL | 0 refills | Status: DC | PRN

## 2018-01-21 MED ORDER — INSULIN ASPART PROT & ASPART (70-30 MIX) 100 UNIT/ML ~~LOC~~ SUSP
22.0000 [IU] | Freq: Every day | SUBCUTANEOUS | Status: DC
  Filled 2018-01-21: qty 10

## 2018-01-21 MED ORDER — INSULIN ASPART PROT & ASPART (70-30 MIX) 100 UNIT/ML ~~LOC~~ SUSP
12.0000 [IU] | Freq: Every day | SUBCUTANEOUS | Status: DC
  Filled 2018-01-21: qty 10

## 2018-01-21 MED ORDER — AMOXICILLIN 500 MG PO CAPS: 500.0000 mg | ORAL_CAPSULE | Freq: Three times a day (TID) | ORAL | 0 refills | Status: AC

## 2018-01-21 NOTE — Discharge Instructions (Signed)
You have bacteremia, and we are treating it with antibiotics.  Please take these antibiotics until January 2nd.  This should include all the pills in the prescription bottle we send with you.  You will have a follow up appointment on January 6th at the family medicine clinic. If you start to feel worse again before then try to schedule an appointment with our clinic to be seen sooner.    We have given you tramadol to help control your pain. Please only take this if you have severe pain.  Please do not take over the counter medications such as ibuoprofen or naproxen/alleve because of your low platelet count.     Bacteriemia en adultos Bacteremia, Adult La bacteriemia es la presencia de bacterias en la sangre. Cuando las bacterias entran en el torrente sanguneo, pueden causar una reaccin potencialmente mortal denominada sepsis, que es una urgencia mdica. La bacteriemia se puede propagar a otras partes del cuerpo, incluidos el corazn, las articulaciones y Secretary/administrator. Cules son las causas? Esta afeccin es causada por las bacterias que ingresan en la [place].  Las bacterias pueden entrar en la sangre: ? Por una infeccin o lesin en la piel, como una quemadura o un corte. ? Por una infeccin pulmonar (neumona). ? Por una infeccin en el estmago o los intestinos (infeccin gastrointestinal). ? Por una infeccin en la vejiga o el sistema urinario (infeccin de las vas [place]). ? Durante un procedimiento dental o mdico. ? Por un sangrado en las encas. ? Cuando una infeccin bacteriana en otra parte de su cuerpo se propaga a la sangre. ? A travs de una aguja sucia (contaminada). Qu incrementa el riesgo? Es ms probable que esta afeccin se manifieste en nios, ancianos y Dealer que:  Tienen una enfermedad o afeccin prolongada (crnica) como diabetes o insuficiencia renal crnica.  Tienen una articulacin o vlvula cardaca artificiales.  Tienen una enfermedad de las vlvulas  cardacas.  Tienen un tubo insertado para tratar Microsoft, como un catter urinario o una va intravenosa.  Tiene debilitado el sistema que combate las enfermedades (sistema inmunitario).  Se inyectan drogas ilegales.  Han estado hospitalizados por ms de [employer]. Cules son los signos o sntomas? Los sntomas de esta afeccin incluyen:  [place].  Escalofros.  Latidos cardacos acelerados.  Falta de aire.  Mareos.  Debilidad.  Confusin.  Nuseas o vmitos.  Diarrea.  Presin arterial baja.  Disminucin de la diuresis. La bacteriemia que se propaga a otras partes del cuerpo puede causar sntomas en esas zonas. En algunos casos no hay sntomas. Cmo se diagnostica? Esta afeccin se Information systems manager un examen fsico y [employer] siguientes:  Un hemograma completo. Este anlisis detecta signos de infeccin.  Cultivos de [place]. Estos se usan para comprobar la presencia de bacterias en la sangre.  Pruebas de cualquier tubo que tenga insertado. Estas pruebas se usan para buscar la fuente de infeccin.  Anlisis de [place], incluidos los urocultivos. Estos anlisis se usan para Landscape architect presencia de bacterias en la orina que podran ser una fuente de infeccin.  Estudios de diagnstico por imgenes, como una radiografa, una exploracin por tomografa computarizada (TC), una resonancia magntica (RM) o Armed forces training and education officer. Estos se usan para detectar la presencia de una fuente de infeccin en otras partes del cuerpo, como los pulmones, las vlvulas cardacas o las articulaciones. Cmo se trata? Esta afeccin suele tratarse en un hospital. El tratamiento puede implicar lo siguiente:  Antibiticos. Estos pueden administrarse por boca (por  va oral) o directamente en el torrente sanguneo a travs de una va intravenosa (infusin a travs de una vena). ? Segn la fuente de infeccin, es posible que necesite antibiticos durante varias  semanas. ? Al principio, se le Social workerpuede administrar un antibitico para Clinical research associatedestruir la mayora de las bacterias en la sangre (antibitico de amplio espectro). Si los Norfolk Southernresultados de los MetLifeanlisis muestran que un determinado tipo de bacterias es el que est causando el problema, es posible que le administren un antibitico diferente para destruir esas bacterias especficas.  Lquidos por va intravenosa.  Extraccin de cualquier catter o dispositivo que pudiera ser una fuente de infeccin.  Control de la presin arterial y asistencia respiratoria, si es necesario.  Ciruga para controlar la fuente o la propagacin de la infeccin, como una ciruga para extirpar un dispositivo, un absceso o tejido que se ha infectado.  Visitas de seguimiento para medicamentos, anlisis de sangre y ms evaluaciones. Siga estas indicaciones en su casa: Medicamentos  Baxter Internationalome los medicamentos de venta libre y los recetados solamente como se lo haya indicado el mdico.  Si le recetaron un antibitico, tmelo como se lo haya indicado el mdico. No deje de tomar el antibitico, aunque comience a Actorsentirse mejor. Indicaciones generales   Descanse todo lo que sea necesario. Pregntele al mdico cundo puede retomar sus actividades normales.  Beba suficiente lquido como para Pharmacologistmantener la orina de color amarillo plido.  No consuma ningn producto que contenga nicotina o tabaco, como cigarrillos y Administrator, Civil Servicecigarrillos electrnicos. Si necesita ayuda para dejar de consumir, consulte al mdico.  Concurra a todas las visitas de control como se lo haya indicado el mdico. Esto es importante. Cmo se evita?   Lvese las manos con frecuencia con agua y [place]. Use desinfectante para manos si no dispone de [place] y [place].  Debe lavarse las manos: ? Despus de usar el bao o cambiar un paal. ? Antes de preparar, cocinar o servir la comida. ? Mientras cuida de una persona enferma, o cuando visita a alguien Loews Corporationen el hospital. ? Antes y despus  de cambiar vendas (vendajes) de heridas.  Limpie cualquier rasguo o corte con agua y [place], y cbralo con vendajes limpios.  Aplquese las vacunas segn se lo indique el mdico.  Mantenga una buena higiene bucal. Cepllese los dientes dos veces al da y use hilo dental con regularidad.  Cudese bien la piel. Esto incluye baarse y humectarse de forma regular. Solicite ayuda inmediatamente si tiene:  Engineer, miningDolor.  Fiebre o escalofros.  Dificultad para respirar.  Frecuencia cardaca acelerada.  Piel manchada, plida o hmeda.  Confusin.  Debilidad.  Falta de energa (letargo) o somnolencia inusual.  Diarrea.  Nuevos sntomas que aparecen despus de que se ha iniciado Scientist, research (medical)el tratamiento. Estos sntomas pueden representar un problema grave que constituye Radio broadcast assistantuna emergencia. No espere a ver si los sntomas desaparecen. Solicite atencin mdica de inmediato. Comunquese con el servicio de emergencias de su localidad (911 en los [place]). No conduzca por sus propios medios OfficeMax Incorporatedhasta el hospital. Resumen  La bacteriemia es la presencia de bacterias en la [place]. Cuando las bacterias entran en el torrente sanguneo, pueden causar una reaccin potencialmente mortal denominada sepsis.  Algunos sntomas de la bacteriemia incluyen fiebre, escalofros, falta de aire, confusin, nuseas o vmitos y [place]ine[place]iarrea.  Se pueden realizar pruebas para hallar la fuente de infeccin causante de la bacteriemia. Estas pruebas pueden incluir anlisis de [place] y de [place], y estudios de diagnstico por imgenes.  Por lo general, la bacteriemia  se trata con antibiticos en un hospital.  Solicite ayuda de inmediato si tiene sntomas nuevos que aparecen despus de que el tratamiento ha comenzado. Esta informacin no tiene Theme park managercomo fin reemplazar el consejo del mdico. Asegrese de hacerle al mdico cualquier pregunta que tenga. Document Released: 04/29/2008 Document Revised: 07/01/2017 Document Reviewed:  07/01/2017 Elsevier Interactive Patient Education  2019 [employer]

## 2018-01-21 NOTE — Progress Notes (Signed)
Discharge instructions reviewed with patient and he verbalizes understanding and agrees to all f/u appts and medications. He is discharged in stable condition with all belongings via wheelchair to family vehicle.

## 2018-01-21 NOTE — Progress Notes (Signed)
Pt stated he was having trouble sleeping because of the pain in his right foot during his stay and wanted to try a medicine to help get to sleep.  The on call physician was paged and ordered a one time Rozerm 8 mg oral tablet.  The Rozerm was given to the pt.  Will continue to monitor.  Harriet Massonavidson, Chauntelle Azpeitia E, RN

## 2018-01-21 NOTE — Care Management (Signed)
Discussed prescriptions with patient via interpreter.  Pt states he cannot pay [facility] price, which is as low as $13.  Pt states he can pay $9 for 3 scripts.  Pt given MATCH letter to take to the pharmacy.  Pt verbalizes understanding.

## 2018-01-21 NOTE — Plan of Care (Signed)
  Problem: Clinical Measurements: Goal: Ability to avoid or minimize complications of infection will improve Outcome: Adequate for Discharge   Problem: Skin Integrity: Goal: Skin integrity will improve Outcome: Adequate for Discharge   Problem: Education: Goal: Knowledge of General Education information will improve Description Including pain rating scale, medication(s)/side effects and non-pharmacologic comfort measures Outcome: Adequate for Discharge   Problem: Health Behavior/Discharge Planning: Goal: Ability to manage health-related needs will improve Outcome: Adequate for Discharge   Problem: Clinical Measurements: Goal: Ability to maintain clinical measurements within normal limits will improve Outcome: Adequate for Discharge Goal: Will remain free from infection Outcome: Adequate for Discharge Goal: Diagnostic test results will improve Outcome: Adequate for Discharge Goal: Respiratory complications will improve Outcome: Adequate for Discharge Goal: Cardiovascular complication will be avoided Outcome: Adequate for Discharge   Problem: Activity: Goal: Risk for activity intolerance will decrease Outcome: Adequate for Discharge   Problem: Nutrition: Goal: Adequate nutrition will be maintained Outcome: Adequate for Discharge   Problem: Coping: Goal: Level of anxiety will decrease Outcome: Adequate for Discharge   Problem: Pain Managment: Goal: General experience of comfort will improve Outcome: Adequate for Discharge   Problem: Safety: Goal: Ability to remain free from injury will improve Outcome: Adequate for Discharge   Problem: Skin Integrity: Goal: Risk for impaired skin integrity will decrease Outcome: Adequate for Discharge   Problem: Metabolic: Goal: Ability to maintain appropriate glucose levels will improve Outcome: Adequate for Discharge   Problem: Tissue Perfusion: Goal: Adequacy of tissue perfusion will improve Outcome: Adequate for  Discharge

## 2018-01-21 NOTE — Progress Notes (Signed)
Family Medicine Teaching Service Daily Progress Note Intern Pager: [phone]  Patient name: Mitchell [name] Medical record number: [id number] Date of birth: 10/29/1975 Age: 42 y.o. Gender: male  Primary Care Provider: Everrett Coombe, MD Consultants: Willia Craze Code Status: Full  Pt Overview and Major Events to Date:  01/16/2018 - admitted with sepsis secondary to R LE foot infection 12/27 - MRI without evidence of osteo  Assessment and Plan: Mitchell [name] a 42 y.o.malepresenting with sepsis 2/2GBS bacteremia.PMH is significant for DMII, GERD, pancytopenia with ?previous history of leukemia, and depression.  Sepsis 2/2 GBS bacteremia: Acute, improving.   VSS, afebrile since 12/23.  Leukocytosis resolved.  BC 1/2 growing strep agalactiae, pansensitive.  Repeat BC NG x2D  Foot wound is likely the source of bacteremia, foot MRI negative for evidence of osteomyelitis. - Continue p.o. amoxicillin, to continue for 10 day course (last dose 1/4) - Vitals per routine  Chronic right foot wound, in setting of previous crush and burn injuries: Progressive.   Darkened appearance of foot, open wound on dorsal surface with pink tissue without any signs of infection. ABI WNL. Arteriogram performed yesterday showing patent large vessels, with distal occlusion of the anterior tibial vessel that would be non-amenable to reconstruction. Orthopedics on board, not recommending any urgent amputation or debridement during this hospitalization.  - Vascular signed off, defer to Ortho for any additional interventions - PT not recommending any PT follow-up - Tylenol scheduled, Zofran as needed - tramadol 100 mg q12H PRN breakthrough pain - avoiding NSAIDS due to low platelets - cont ASA, statin  Chronic pancytopenia: stable.  WBC 2.4, hemoglobin 11.7, platelets 84.  ANC 1.6 today.  Pathologist review of smear is pending.  Retic count appropriate.  Intermittent pancytopenia for the last several years,  note liver cirrhosis and splenomegaly on CT abdomen back in 2013. Has not seen a hematologist, was referred at last admission but never received a call. 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  A1c 10.7 in 11/2017. On 70/30at home. CBG 198-339. Required 11 units of SSI yesterday. -Continue with moderate SSI - cautiously titrated up 70/30 regimen 22u in AM, 12u in PM - CBGs before meals and nightly  -Holding home metformin with continued procedures during hospitalization -Continued diabetes education   FEN/GI:HH/carb modified diet Prophylaxis:lovenox  Disposition:  Continued evaluation for bacteremia  Subjective:  Spoke to the patient through the interpreter this morning. He is continuing to have some pain but denies other symptoms. No acute events   Objective: Temp:  [97.8 F (36.6 C)-98.2 F (36.8 C)] 97.8 F (36.6 C) (12/28 0319) Pulse Rate:  [73] 73 (12/28 0319) Resp:  [18] 18 (12/28 0319) BP: (104-114)/(60-67) 114/67 (12/28 0319) SpO2:  [98 %] 98 % (12/28 0319)  Physical Exam:  General: Alert, NAD Cardiac: RRR, no M/R/G Lungs: CTA bil, comfortable WOB Abdomen: soft, nt, nd Msk: Moves all extremities spontaneously  Ext: Warm, dry, 2+ distal pulses, no edema. Darkened appearance of right lower extremity below the ankle which is thin. wrapped bandage removed but small bandage over the open wound was left on today. Bandage clean, dry, intact. Pictures below from yesterday      Laboratory: Recent Labs  Lab 01/19/18 0305 01/20/18 0331 01/21/18 0313  WBC 2.5* 3.0* 2.4*  HGB 12.2* 12.6* 11.7*  HCT 34.0* 35.8* 34.1*  PLT 67* 83* 84*   Recent Labs  Lab 01/16/18 1108  01/19/18 0305 01/20/18 0331 01/21/18 0313  NA 133*   < > 137 136 135  K 3.4*   < > 3.4* 4.2 4.0  CL 98   < > 100 99 99  CO2 19*   < > _0 BUN 13   < > 5* 9 11  CREATININE 0.97   < > 0.67 0.63 0.67  CALCIUM 8.5*   < > 8.6* 8.6* 8.4*  PROT 8.0  --   --   --  6.1*  BILITOT 3.2*  --   --   --  0.6  ALKPHOS 94  --   --   --  144*  ALT 50*  --   --   --  36  AST 75*  --   --   --  47*  GLUCOSE 270*   < > 239* 230* 339*   < > = values in this interval not displayed.   CRP 5.7 ESR 28 INR: 1.10  Imaging/Diagnostic Tests: Vas [place] Abi With/wo Tbi: 01/17/2018 LOWER EXTREMITY DOPPLER STUDY Indications: Open wound on the dorsal foot and plantar foot.  Comparison Study: Comparrison study available for viewing from 03/25/2015 Performing Technologist: Birdena Crandall, [place]  Examination Guidelines: A complete evaluation includes at minimum, Doppler waveform signals and systolic blood pressure reading at the level of bilateral brachial, anterior tibial, and posterior tibial arteries, when vessel segments are accessible. Bilateral testing is considered an integral part of a complete examination. Photoelectric Plethysmograph (PPG) waveforms and toe systolic pressure readings are included as required and additional duplex testing as needed. Limited examinations for reoccurring indications may be performed as noted.  ABI Findings: +--------+-----------------+-----+---------+----------------------------------+ Right   Rt Pressure      IndexWaveform Comment                                    (mmHg)                                                            +--------+-----------------+-----+---------+----------------------------------+ [id number]                   triphasic                                   +--------+-----------------+-----+---------+----------------------------------+ ATA     113              1.09 triphasic                                   +--------+-----------------+-----+---------+----------------------------------+ PTA     122              1.17 triphasic                                    +--------+-----------------+-----+---------+----------------------------------+ DP  Unable to evaluate due to open                                            wound                              +--------+-----------------+-----+---------+----------------------------------+ +--------+------------------+-----+---------+-------+ Left    Lt Pressure (mmHg)IndexWaveform Comment +--------+------------------+-----+---------+-------+ Brachial102                    triphasic        +--------+------------------+-----+---------+-------+ PTA     128               1.23 triphasic        +--------+------------------+-----+---------+-------+ DP      113               1.09 triphasic        +--------+------------------+-----+---------+-------+ +-------+-----------+-----------+------------+------------+ ABI/TBIToday's ABIToday's TBIPrevious ABIPrevious TBI +-------+-----------+-----------+------------+------------+ Right  1.17                  1.10                     +-------+-----------+-----------+------------+------------+ Left   1.23                  1.14                     +-------+-----------+-----------+------------+------------+ Unable to evaluate the right great toe which was the area of interest. 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  Summary: Right: Resting right ankle-brachial index is within normal range. No evidence of significant right lower extremity arterial disease. Left: Resting left ankle-brachial index is within normal range. No evidence of significant left lower extremity arterial disease.  *See table(s) above for measurements and observations.     Preliminary    Everrett Coombe, MD 01/21/2018, 12:26 PM PGY-3, [facility] Intern pager: [phone], text pages welcome

## 2018-01-21 NOTE — Progress Notes (Signed)
Pt complained of pain in right foot of 8 out of 10. He had taken the Tramadol 50 mg tablet at 1905 but it did not help.  The on call physician was paged, asking if there was anything else the pt could have. The physician ordered an extra tablet of Tramadol 50 mg once and changed the PRN order to Tramadol 100 mg every 6 hours as needed for severe pain.  Will continue to monitor.  Harriet Massonavidson, Orville Widmann E, RN

## 2018-01-24 LAB — CULTURE, BLOOD (ROUTINE X 2)
CULTURE: NO GROWTH
Culture: NO GROWTH
Special Requests: ADEQUATE
Special Requests: ADEQUATE

## 2018-01-30 ENCOUNTER — Ambulatory Visit: Payer: Self-pay | Admitting: Family Medicine

## 2018-03-01 ENCOUNTER — Ambulatory Visit: Payer: Self-pay | Admitting: *Deleted

## 2018-07-31 ENCOUNTER — Other Ambulatory Visit: Payer: Self-pay

## 2018-07-31 ENCOUNTER — Emergency Department (HOSPITAL_COMMUNITY): Payer: Medicaid Other

## 2018-07-31 ENCOUNTER — Encounter (HOSPITAL_COMMUNITY): Payer: Self-pay | Admitting: *Deleted

## 2018-07-31 ENCOUNTER — Inpatient Hospital Stay (HOSPITAL_COMMUNITY)
Admission: EM | Admit: 2018-07-31 | Discharge: 2018-08-03 | DRG: 391 | Disposition: A | Discharge status: Home / Self Care | Payer: Medicaid Other | Attending: Family Medicine | Admitting: Family Medicine

## 2018-07-31 DIAGNOSIS — E785 Hyperlipidemia, unspecified: Secondary | ICD-10-CM | POA: Diagnosis present

## 2018-07-31 DIAGNOSIS — K766 Portal hypertension: Secondary | ICD-10-CM | POA: Diagnosis present

## 2018-07-31 DIAGNOSIS — K7030 Alcoholic cirrhosis of liver without ascites: Secondary | ICD-10-CM | POA: Diagnosis present

## 2018-07-31 DIAGNOSIS — E11621 Type 2 diabetes mellitus with foot ulcer: Secondary | ICD-10-CM | POA: Diagnosis present

## 2018-07-31 DIAGNOSIS — R0602 Shortness of breath: Secondary | ICD-10-CM

## 2018-07-31 DIAGNOSIS — K5790 Diverticulosis of intestine, part unspecified, without perforation or abscess without bleeding: Secondary | ICD-10-CM | POA: Diagnosis present

## 2018-07-31 DIAGNOSIS — K921 Melena: Secondary | ICD-10-CM | POA: Diagnosis present

## 2018-07-31 DIAGNOSIS — E876 Hypokalemia: Secondary | ICD-10-CM | POA: Diagnosis present

## 2018-07-31 DIAGNOSIS — R9389 Abnormal findings on diagnostic imaging of other specified body structures: Secondary | ICD-10-CM | POA: Diagnosis present

## 2018-07-31 DIAGNOSIS — E1100 Type 2 diabetes mellitus with hyperosmolarity without nonketotic hyperglycemic-hyperosmolar coma (NKHHC): Secondary | ICD-10-CM | POA: Diagnosis present

## 2018-07-31 DIAGNOSIS — G629 Polyneuropathy, unspecified: Secondary | ICD-10-CM | POA: Diagnosis present

## 2018-07-31 DIAGNOSIS — R8281 Pyuria: Secondary | ICD-10-CM | POA: Diagnosis present

## 2018-07-31 DIAGNOSIS — G8929 Other chronic pain: Secondary | ICD-10-CM | POA: Diagnosis present

## 2018-07-31 DIAGNOSIS — Z79891 Long term (current) use of opiate analgesic: Secondary | ICD-10-CM

## 2018-07-31 DIAGNOSIS — I8510 Secondary esophageal varices without bleeding: Secondary | ICD-10-CM | POA: Diagnosis present

## 2018-07-31 DIAGNOSIS — F1510 Other stimulant abuse, uncomplicated: Secondary | ICD-10-CM | POA: Diagnosis present

## 2018-07-31 DIAGNOSIS — K3189 Other diseases of stomach and duodenum: Principal | ICD-10-CM | POA: Diagnosis present

## 2018-07-31 DIAGNOSIS — K219 Gastro-esophageal reflux disease without esophagitis: Secondary | ICD-10-CM | POA: Diagnosis present

## 2018-07-31 DIAGNOSIS — N390 Urinary tract infection, site not specified: Secondary | ICD-10-CM | POA: Diagnosis present

## 2018-07-31 DIAGNOSIS — K760 Fatty (change of) liver, not elsewhere classified: Secondary | ICD-10-CM | POA: Diagnosis present

## 2018-07-31 DIAGNOSIS — I8511 Secondary esophageal varices with bleeding: Secondary | ICD-10-CM | POA: Diagnosis present

## 2018-07-31 DIAGNOSIS — E118 Type 2 diabetes mellitus with unspecified complications: Secondary | ICD-10-CM | POA: Diagnosis present

## 2018-07-31 DIAGNOSIS — E872 Acidosis: Secondary | ICD-10-CM | POA: Diagnosis present

## 2018-07-31 DIAGNOSIS — D62 Acute posthemorrhagic anemia: Secondary | ICD-10-CM | POA: Diagnosis present

## 2018-07-31 DIAGNOSIS — D689 Coagulation defect, unspecified: Secondary | ICD-10-CM | POA: Diagnosis present

## 2018-07-31 DIAGNOSIS — K922 Gastrointestinal hemorrhage, unspecified: Secondary | ICD-10-CM

## 2018-07-31 DIAGNOSIS — F1011 Alcohol abuse, in remission: Secondary | ICD-10-CM | POA: Diagnosis present

## 2018-07-31 DIAGNOSIS — E1165 Type 2 diabetes mellitus with hyperglycemia: Secondary | ICD-10-CM | POA: Diagnosis present

## 2018-07-31 DIAGNOSIS — R3129 Other microscopic hematuria: Secondary | ICD-10-CM | POA: Diagnosis present

## 2018-07-31 DIAGNOSIS — Z89411 Acquired absence of right great toe: Secondary | ICD-10-CM

## 2018-07-31 DIAGNOSIS — Z794 Long term (current) use of insulin: Secondary | ICD-10-CM

## 2018-07-31 DIAGNOSIS — F3340 Major depressive disorder, recurrent, in remission, unspecified: Secondary | ICD-10-CM

## 2018-07-31 DIAGNOSIS — Z79899 Other long term (current) drug therapy: Secondary | ICD-10-CM

## 2018-07-31 DIAGNOSIS — F1021 Alcohol dependence, in remission: Secondary | ICD-10-CM | POA: Diagnosis present

## 2018-07-31 DIAGNOSIS — E8809 Other disorders of plasma-protein metabolism, not elsewhere classified: Secondary | ICD-10-CM | POA: Diagnosis present

## 2018-07-31 DIAGNOSIS — Z833 Family history of diabetes mellitus: Secondary | ICD-10-CM

## 2018-07-31 DIAGNOSIS — E11628 Type 2 diabetes mellitus with other skin complications: Secondary | ICD-10-CM | POA: Diagnosis present

## 2018-07-31 DIAGNOSIS — E11618 Type 2 diabetes mellitus with other diabetic arthropathy: Secondary | ICD-10-CM

## 2018-07-31 DIAGNOSIS — I8500 Esophageal varices without bleeding: Secondary | ICD-10-CM | POA: Diagnosis present

## 2018-07-31 DIAGNOSIS — K920 Hematemesis: Secondary | ICD-10-CM

## 2018-07-31 DIAGNOSIS — K7460 Unspecified cirrhosis of liver: Secondary | ICD-10-CM | POA: Diagnosis present

## 2018-07-31 DIAGNOSIS — L089 Local infection of the skin and subcutaneous tissue, unspecified: Secondary | ICD-10-CM | POA: Diagnosis present

## 2018-07-31 DIAGNOSIS — M79671 Pain in right foot: Secondary | ICD-10-CM | POA: Diagnosis present

## 2018-07-31 DIAGNOSIS — E871 Hypo-osmolality and hyponatremia: Secondary | ICD-10-CM | POA: Diagnosis present

## 2018-07-31 DIAGNOSIS — R161 Splenomegaly, not elsewhere classified: Secondary | ICD-10-CM | POA: Diagnosis present

## 2018-07-31 DIAGNOSIS — D696 Thrombocytopenia, unspecified: Secondary | ICD-10-CM | POA: Diagnosis present

## 2018-07-31 DIAGNOSIS — Z1159 Encounter for screening for other viral diseases: Secondary | ICD-10-CM

## 2018-07-31 DIAGNOSIS — F329 Major depressive disorder, single episode, unspecified: Secondary | ICD-10-CM | POA: Diagnosis present

## 2018-07-31 DIAGNOSIS — D500 Iron deficiency anemia secondary to blood loss (chronic): Secondary | ICD-10-CM | POA: Diagnosis present

## 2018-07-31 DIAGNOSIS — L97519 Non-pressure chronic ulcer of other part of right foot with unspecified severity: Secondary | ICD-10-CM | POA: Diagnosis present

## 2018-07-31 DIAGNOSIS — F1590 Other stimulant use, unspecified, uncomplicated: Secondary | ICD-10-CM | POA: Diagnosis present

## 2018-07-31 DIAGNOSIS — E860 Dehydration: Secondary | ICD-10-CM | POA: Diagnosis present

## 2018-07-31 HISTORY — DX: Non-pressure chronic ulcer of other part of right foot limited to breakdown of skin: L97.511

## 2018-07-31 HISTORY — DX: Unspecified cirrhosis of liver: K74.60

## 2018-07-31 HISTORY — DX: Type 2 diabetes mellitus with hyperosmolarity without nonketotic hyperglycemic-hyperosmolar coma (NKHHC): E11.00

## 2018-07-31 HISTORY — DX: Type 2 diabetes mellitus with foot ulcer: E11.621

## 2018-07-31 HISTORY — DX: Type 2 diabetes mellitus with foot ulcer: L97.509

## 2018-07-31 LAB — SALICYLATE LEVEL: Salicylate Lvl: <7 mg/dL (ref 2.8–30.0)

## 2018-07-31 LAB — GLUCOSE, CAPILLARY: Glucose-Capillary: 297 mg/dL — ABNORMAL HIGH (ref 70–99)

## 2018-07-31 LAB — CBC WITH DIFFERENTIAL/PLATELET
Abs Immature Granulocytes: 0.05 10*3/uL (ref 0.00–0.07)
Basophils Absolute: 0 10*3/uL (ref 0.0–0.1)
Basophils Relative: 0 %
Eosinophils Absolute: 0 10*3/uL (ref 0.0–0.5)
Eosinophils Relative: 0 %
HCT: 23.2 % — ABNORMAL LOW (ref 39.0–52.0)
Hemoglobin: 8.6 g/dL — ABNORMAL LOW (ref 13.0–17.0)
Immature Granulocytes: 1 %
Lymphocytes Relative: 10 %
Lymphs Abs: 0.9 10*3/uL (ref 0.7–4.0)
MCH: 32.6 pg (ref 26.0–34.0)
MCHC: 37.1 g/dL — ABNORMAL HIGH (ref 30.0–36.0)
MCV: 87.9 fL (ref 80.0–100.0)
Monocytes Absolute: 0.5 10*3/uL (ref 0.1–1.0)
Monocytes Relative: 5 %
Neutro Abs: 7.8 10*3/uL — ABNORMAL HIGH (ref 1.7–7.7)
Neutrophils Relative %: 84 %
Platelets: 152 10*3/uL (ref 150–400)
RBC: 2.64 MIL/uL — ABNORMAL LOW (ref 4.22–5.81)
RDW: 13.7 % (ref 11.5–15.5)
WBC: 9.4 10*3/uL (ref 4.0–10.5)
nRBC: 0 % (ref 0.0–0.2)

## 2018-07-31 LAB — URINALYSIS, ROUTINE W REFLEX MICROSCOPIC
Bilirubin Urine: NEGATIVE
Glucose, UA: >=500 mg/dL — AB
Ketones, ur: 5 mg/dL — AB
Nitrite: NEGATIVE
Protein, ur: NEGATIVE mg/dL
Specific Gravity, Urine: 1.026 (ref 1.005–1.030)
pH: 6 (ref 5.0–8.0)

## 2018-07-31 LAB — COMPREHENSIVE METABOLIC PANEL
ALT: 23 U/L (ref 0–44)
AST: 23 U/L (ref 15–41)
Albumin: 2.9 g/dL — ABNORMAL LOW (ref 3.5–5.0)
Alkaline Phosphatase: 84 U/L (ref 38–126)
Anion gap: 11 (ref 5–15)
BUN: 28 mg/dL — ABNORMAL HIGH (ref 6–20)
CO2: 22 mmol/L (ref 22–32)
Calcium: 8.3 mg/dL — ABNORMAL LOW (ref 8.9–10.3)
Chloride: 91 mmol/L — ABNORMAL LOW (ref 98–111)
Creatinine, Ser: 0.79 mg/dL (ref 0.61–1.24)
GFR calc Af Amer: >60 mL/min (ref >60)
GFR calc non Af Amer: >60 mL/min (ref >60)
Glucose, Bld: 736 mg/dL (ref 70–99)
Potassium: 4.1 mmol/L (ref 3.5–5.1)
Sodium: 124 mmol/L — ABNORMAL LOW (ref 135–145)
Total Bilirubin: 1.2 mg/dL (ref 0.3–1.2)
Total Protein: 5.8 g/dL — ABNORMAL LOW (ref 6.5–8.1)

## 2018-07-31 LAB — LIPASE, BLOOD: Lipase: 46 U/L (ref 11–51)

## 2018-07-31 LAB — POCT I-STAT EG7
Acid-base deficit: 4 mmol/L — ABNORMAL HIGH (ref 0.0–2.0)
Bicarbonate: 21.4 mmol/L (ref 20.0–28.0)
Calcium, Ion: 1.16 mmol/L (ref 1.15–1.40)
HCT: 50 % (ref 39.0–52.0)
Hemoglobin: 17 g/dL (ref 13.0–17.0)
O2 Saturation: 23 %
Potassium: 3.7 mmol/L (ref 3.5–5.1)
Sodium: 130 mmol/L — ABNORMAL LOW (ref 135–145)
TCO2: 23 mmol/L (ref 22–32)
pCO2, Ven: 40.8 mmHg — ABNORMAL LOW (ref 44.0–60.0)
pH, Ven: 7.327 (ref 7.250–7.430)
pO2, Ven: 18 mmHg — CL (ref 32.0–45.0)

## 2018-07-31 LAB — ACETAMINOPHEN LEVEL: Acetaminophen (Tylenol), Serum: <10 ug/mL — ABNORMAL LOW (ref 10–30)

## 2018-07-31 LAB — RAPID URINE DRUG SCREEN, HOSP PERFORMED
Amphetamines: POSITIVE — AB
Barbiturates: NOT DETECTED
Benzodiazepines: NOT DETECTED
Cocaine: NOT DETECTED
Opiates: NOT DETECTED
Tetrahydrocannabinol: NOT DETECTED

## 2018-07-31 LAB — POC OCCULT BLOOD, ED: Fecal Occult Bld: POSITIVE — AB

## 2018-07-31 LAB — CBG MONITORING, ED: Glucose-Capillary: 411 mg/dL — ABNORMAL HIGH (ref 70–99)

## 2018-07-31 LAB — PROTIME-INR
INR: 1.3 — ABNORMAL HIGH (ref 0.8–1.2)
Prothrombin Time: 15.8 seconds — ABNORMAL HIGH (ref 11.4–15.2)

## 2018-07-31 LAB — ETHANOL: Alcohol, Ethyl (B): <10 mg/dL (ref <10)

## 2018-07-31 LAB — LACTIC ACID, PLASMA: Lactic Acid, Venous: 2.2 mmol/L (ref 0.5–1.9)

## 2018-07-31 LAB — SARS CORONAVIRUS 2 BY RT PCR (HOSPITAL ORDER, PERFORMED IN ~~LOC~~ HOSPITAL LAB): SARS Coronavirus 2: NEGATIVE

## 2018-07-31 MED ORDER — FENTANYL CITRATE (PF) 100 MCG/2ML IJ SOLN
50.0000 ug | Freq: Once | INTRAMUSCULAR | Status: AC
  Administered 2018-07-31: 18:00:00 50 ug via INTRAVENOUS
  Filled 2018-07-31: qty 2

## 2018-07-31 MED ORDER — SODIUM CHLORIDE 0.9 % IV BOLUS
1000.0000 mL | Freq: Once | INTRAVENOUS | Status: AC
  Administered 2018-07-31: 16:00:00 1000 mL via INTRAVENOUS

## 2018-07-31 MED ORDER — SODIUM CHLORIDE 0.9 % IV SOLN
INTRAVENOUS | Status: DC
  Administered 2018-07-31: 23:00:00 via INTRAVENOUS
  Administered 2018-08-01 (×2): 1000 mL via INTRAVENOUS

## 2018-07-31 MED ORDER — MORPHINE SULFATE (PF) 2 MG/ML IV SOLN
2.0000 mg | INTRAVENOUS | Status: DC | PRN
  Administered 2018-08-01 – 2018-08-02 (×5): 2 mg via INTRAVENOUS
  Filled 2018-07-31 (×5): qty 1

## 2018-07-31 MED ORDER — INSULIN ASPART 100 UNIT/ML ~~LOC~~ SOLN
0.0000 [IU] | Freq: Three times a day (TID) | SUBCUTANEOUS | Status: DC
  Administered 2018-08-01 (×2): 8 [IU] via SUBCUTANEOUS
  Administered 2018-08-01: 3 [IU] via SUBCUTANEOUS
  Administered 2018-08-02 – 2018-08-03 (×4): 5 [IU] via SUBCUTANEOUS
  Administered 2018-08-03: 12:00:00 8 [IU] via SUBCUTANEOUS

## 2018-07-31 MED ORDER — IOHEXOL 300 MG/ML  SOLN: 100.0000 mL | Freq: Once | INTRAMUSCULAR | Status: DC | PRN

## 2018-07-31 MED ORDER — ATORVASTATIN CALCIUM 40 MG PO TABS
40.0000 mg | ORAL_TABLET | Freq: Every day | ORAL | Status: DC
  Administered 2018-08-01 – 2018-08-03 (×3): 40 mg via ORAL
  Filled 2018-07-31 (×3): qty 1

## 2018-07-31 MED ORDER — OCTREOTIDE LOAD VIA INFUSION
50.0000 ug | Freq: Once | INTRAVENOUS | Status: AC
  Administered 2018-07-31: 50 ug via INTRAVENOUS
  Filled 2018-07-31: qty 25

## 2018-07-31 MED ORDER — SODIUM CHLORIDE 0.9 % IV SOLN
1.0000 g | INTRAVENOUS | Status: DC
  Administered 2018-07-31 – 2018-08-01 (×2): 1 g via INTRAVENOUS
  Filled 2018-07-31 (×2): qty 10

## 2018-07-31 MED ORDER — SODIUM CHLORIDE 0.9 % IV SOLN
50.0000 ug/h | INTRAVENOUS | Status: AC
  Administered 2018-07-31 – 2018-08-01 (×3): 50 ug/h via INTRAVENOUS
  Filled 2018-07-31 (×5): qty 1

## 2018-07-31 MED ORDER — ONDANSETRON HCL 4 MG/2ML IJ SOLN
4.0000 mg | Freq: Once | INTRAMUSCULAR | Status: AC
  Administered 2018-07-31: 16:00:00 4 mg via INTRAVENOUS
  Filled 2018-07-31: qty 2

## 2018-07-31 MED ORDER — IOHEXOL 350 MG/ML SOLN
100.0000 mL | Freq: Once | INTRAVENOUS | Status: AC | PRN
  Administered 2018-07-31: 100 mL via INTRAVENOUS

## 2018-07-31 MED ORDER — SODIUM CHLORIDE 0.9 % IV SOLN
80.0000 mg | Freq: Once | INTRAVENOUS | Status: AC
  Administered 2018-07-31: 16:00:00 80 mg via INTRAVENOUS
  Filled 2018-07-31: qty 80

## 2018-07-31 MED ORDER — FENTANYL CITRATE (PF) 100 MCG/2ML IJ SOLN
50.0000 ug | Freq: Once | INTRAMUSCULAR | Status: AC
  Administered 2018-07-31: 18:00:00 50 ug via INTRAVENOUS

## 2018-07-31 MED ORDER — SODIUM CHLORIDE 0.9 % IV SOLN
8.0000 mg/h | INTRAVENOUS | Status: DC
  Administered 2018-07-31 – 2018-08-01 (×2): 8 mg/h via INTRAVENOUS
  Filled 2018-07-31 (×3): qty 80

## 2018-07-31 MED ORDER — SODIUM CHLORIDE 0.9 % IV BOLUS
1000.0000 mL | Freq: Once | INTRAVENOUS | Status: AC
  Administered 2018-07-31: 17:00:00 1000 mL via INTRAVENOUS

## 2018-07-31 NOTE — ED Triage Notes (Signed)
Pt reports right side abd pain since yesterday and vomiting black emesis. Denies diarrhea.
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
## 2018-08-01 ENCOUNTER — Inpatient Hospital Stay (HOSPITAL_COMMUNITY): Payer: Medicaid Other | Admitting: Anesthesiology

## 2018-08-01 ENCOUNTER — Encounter (HOSPITAL_COMMUNITY): Payer: Self-pay | Admitting: Family Medicine

## 2018-08-01 ENCOUNTER — Encounter (HOSPITAL_COMMUNITY)
Admission: EM | Disposition: A | Discharge status: Home / Self Care | Payer: Self-pay | Source: Home / Self Care | Attending: Family Medicine

## 2018-08-01 DIAGNOSIS — R8281 Pyuria: Secondary | ICD-10-CM | POA: Diagnosis present

## 2018-08-01 DIAGNOSIS — R3129 Other microscopic hematuria: Secondary | ICD-10-CM | POA: Diagnosis present

## 2018-08-01 DIAGNOSIS — F1011 Alcohol abuse, in remission: Secondary | ICD-10-CM

## 2018-08-01 DIAGNOSIS — I8511 Secondary esophageal varices with bleeding: Secondary | ICD-10-CM | POA: Diagnosis not present

## 2018-08-01 DIAGNOSIS — K766 Portal hypertension: Secondary | ICD-10-CM | POA: Diagnosis not present

## 2018-08-01 DIAGNOSIS — R9389 Abnormal findings on diagnostic imaging of other specified body structures: Secondary | ICD-10-CM | POA: Diagnosis present

## 2018-08-01 DIAGNOSIS — E8809 Other disorders of plasma-protein metabolism, not elsewhere classified: Secondary | ICD-10-CM | POA: Diagnosis present

## 2018-08-01 DIAGNOSIS — L089 Local infection of the skin and subcutaneous tissue, unspecified: Secondary | ICD-10-CM

## 2018-08-01 DIAGNOSIS — K7030 Alcoholic cirrhosis of liver without ascites: Secondary | ICD-10-CM

## 2018-08-01 DIAGNOSIS — K7460 Unspecified cirrhosis of liver: Secondary | ICD-10-CM | POA: Diagnosis present

## 2018-08-01 DIAGNOSIS — R161 Splenomegaly, not elsewhere classified: Secondary | ICD-10-CM | POA: Diagnosis present

## 2018-08-01 DIAGNOSIS — E11628 Type 2 diabetes mellitus with other skin complications: Secondary | ICD-10-CM

## 2018-08-01 DIAGNOSIS — K219 Gastro-esophageal reflux disease without esophagitis: Secondary | ICD-10-CM

## 2018-08-01 DIAGNOSIS — K922 Gastrointestinal hemorrhage, unspecified: Secondary | ICD-10-CM | POA: Diagnosis present

## 2018-08-01 DIAGNOSIS — F1510 Other stimulant abuse, uncomplicated: Secondary | ICD-10-CM | POA: Diagnosis present

## 2018-08-01 DIAGNOSIS — E118 Type 2 diabetes mellitus with unspecified complications: Secondary | ICD-10-CM

## 2018-08-01 DIAGNOSIS — I8500 Esophageal varices without bleeding: Secondary | ICD-10-CM

## 2018-08-01 DIAGNOSIS — D62 Acute posthemorrhagic anemia: Secondary | ICD-10-CM | POA: Diagnosis present

## 2018-08-01 DIAGNOSIS — K920 Hematemesis: Secondary | ICD-10-CM | POA: Diagnosis not present

## 2018-08-01 DIAGNOSIS — K3189 Other diseases of stomach and duodenum: Secondary | ICD-10-CM | POA: Diagnosis not present

## 2018-08-01 DIAGNOSIS — E860 Dehydration: Secondary | ICD-10-CM | POA: Diagnosis present

## 2018-08-01 HISTORY — PX: ESOPHAGOGASTRODUODENOSCOPY (EGD) WITH PROPOFOL: SHX5813

## 2018-08-01 HISTORY — PX: ESOPHAGEAL BANDING: SHX5518

## 2018-08-01 HISTORY — PX: ESOPHAGEAL VARICE LIGATION: SHX625

## 2018-08-01 LAB — COMPREHENSIVE METABOLIC PANEL
ALT: 21 U/L (ref 0–44)
AST: 20 U/L (ref 15–41)
Albumin: 2.5 g/dL — ABNORMAL LOW (ref 3.5–5.0)
Alkaline Phosphatase: 78 U/L (ref 38–126)
Anion gap: 8 (ref 5–15)
BUN: 15 mg/dL (ref 6–20)
CO2: 20 mmol/L — ABNORMAL LOW (ref 22–32)
Calcium: 7.5 mg/dL — ABNORMAL LOW (ref 8.9–10.3)
Chloride: 106 mmol/L (ref 98–111)
Creatinine, Ser: 0.64 mg/dL (ref 0.61–1.24)
GFR calc Af Amer: >60 mL/min (ref >60)
GFR calc non Af Amer: >60 mL/min (ref >60)
Glucose, Bld: 312 mg/dL — ABNORMAL HIGH (ref 70–99)
Potassium: 3.5 mmol/L (ref 3.5–5.1)
Sodium: 134 mmol/L — ABNORMAL LOW (ref 135–145)
Total Bilirubin: 1.1 mg/dL (ref 0.3–1.2)
Total Protein: 4.9 g/dL — ABNORMAL LOW (ref 6.5–8.1)

## 2018-08-01 LAB — GLUCOSE, CAPILLARY
Glucose-Capillary: 258 mg/dL — ABNORMAL HIGH (ref 70–99)
Glucose-Capillary: 171 mg/dL — ABNORMAL HIGH (ref 70–99)
Glucose-Capillary: 157 mg/dL — ABNORMAL HIGH (ref 70–99)
Glucose-Capillary: 293 mg/dL — ABNORMAL HIGH (ref 70–99)
Glucose-Capillary: 207 mg/dL — ABNORMAL HIGH (ref 70–99)

## 2018-08-01 LAB — MRSA PCR SCREENING: MRSA by PCR: NEGATIVE

## 2018-08-01 LAB — CBC
HCT: 19.3 % — ABNORMAL LOW (ref 39.0–52.0)
HCT: 20 % — ABNORMAL LOW (ref 39.0–52.0)
HCT: 18.8 % — ABNORMAL LOW (ref 39.0–52.0)
HCT: 23.5 % — ABNORMAL LOW (ref 39.0–52.0)
Hemoglobin: 7 g/dL — ABNORMAL LOW (ref 13.0–17.0)
Hemoglobin: 7.3 g/dL — ABNORMAL LOW (ref 13.0–17.0)
Hemoglobin: 6.7 g/dL — CL (ref 13.0–17.0)
Hemoglobin: 8.4 g/dL — ABNORMAL LOW (ref 13.0–17.0)
MCH: 32 pg (ref 26.0–34.0)
MCH: 33.2 pg (ref 26.0–34.0)
MCH: 32.5 pg (ref 26.0–34.0)
MCH: 31.8 pg (ref 26.0–34.0)
MCHC: 36.3 g/dL — ABNORMAL HIGH (ref 30.0–36.0)
MCHC: 36.5 g/dL — ABNORMAL HIGH (ref 30.0–36.0)
MCHC: 35.6 g/dL (ref 30.0–36.0)
MCHC: 35.7 g/dL (ref 30.0–36.0)
MCV: 88.1 fL (ref 80.0–100.0)
MCV: 90.9 fL (ref 80.0–100.0)
MCV: 91.3 fL (ref 80.0–100.0)
MCV: 89 fL (ref 80.0–100.0)
Platelets: 121 10*3/uL — ABNORMAL LOW (ref 150–400)
Platelets: 110 10*3/uL — ABNORMAL LOW (ref 150–400)
Platelets: 109 10*3/uL — ABNORMAL LOW (ref 150–400)
Platelets: 119 10*3/uL — ABNORMAL LOW (ref 150–400)
RBC: 2.19 MIL/uL — ABNORMAL LOW (ref 4.22–5.81)
RBC: 2.2 MIL/uL — ABNORMAL LOW (ref 4.22–5.81)
RBC: 2.06 MIL/uL — ABNORMAL LOW (ref 4.22–5.81)
RBC: 2.64 MIL/uL — ABNORMAL LOW (ref 4.22–5.81)
RDW: 14.5 % (ref 11.5–15.5)
RDW: 14.8 % (ref 11.5–15.5)
RDW: 14.7 % (ref 11.5–15.5)
RDW: 15.8 % — ABNORMAL HIGH (ref 11.5–15.5)
WBC: 5.9 10*3/uL (ref 4.0–10.5)
WBC: 5.4 10*3/uL (ref 4.0–10.5)
WBC: 4 10*3/uL (ref 4.0–10.5)
WBC: 4.5 10*3/uL (ref 4.0–10.5)
nRBC: 0 % (ref 0.0–0.2)
nRBC: 0 % (ref 0.0–0.2)
nRBC: 0 % (ref 0.0–0.2)
nRBC: 0 % (ref 0.0–0.2)

## 2018-08-01 LAB — HEMOGLOBIN A1C
Hgb A1c MFr Bld: 12.1 % — ABNORMAL HIGH (ref 4.8–5.6)
Mean Plasma Glucose: 300.57 mg/dL

## 2018-08-01 LAB — URINE CULTURE: Culture: 50000 — AB

## 2018-08-01 LAB — LACTIC ACID, PLASMA
Lactic Acid, Venous: 0.9 mmol/L (ref 0.5–1.9)
Lactic Acid, Venous: 0.8 mmol/L (ref 0.5–1.9)

## 2018-08-01 LAB — PROTIME-INR
INR: 1.3 — ABNORMAL HIGH (ref 0.8–1.2)
Prothrombin Time: 15.9 seconds — ABNORMAL HIGH (ref 11.4–15.2)

## 2018-08-01 LAB — PREPARE RBC (CROSSMATCH)

## 2018-08-01 LAB — APTT: aPTT: 29 seconds (ref 24–36)

## 2018-08-01 SURGERY — ESOPHAGOGASTRODUODENOSCOPY (EGD) WITH PROPOFOL
Anesthesia: Monitor Anesthesia Care

## 2018-08-01 MED ORDER — LIDOCAINE 4 % EX CREA
TOPICAL_CREAM | Freq: Three times a day (TID) | CUTANEOUS | Status: DC | PRN
  Filled 2018-08-01: qty 5

## 2018-08-01 MED ORDER — PHENYLEPHRINE 40 MCG/ML (10ML) SYRINGE FOR IV PUSH (FOR BLOOD PRESSURE SUPPORT)
PREFILLED_SYRINGE | INTRAVENOUS | Status: DC | PRN
  Administered 2018-08-01: 40 ug via INTRAVENOUS
  Administered 2018-08-01: 80 ug via INTRAVENOUS
  Administered 2018-08-01: 120 ug via INTRAVENOUS
  Administered 2018-08-01 (×2): 80 ug via INTRAVENOUS

## 2018-08-01 MED ORDER — PROPOFOL 10 MG/ML IV BOLUS
INTRAVENOUS | Status: DC | PRN
  Administered 2018-08-01 (×2): 22 mg via INTRAVENOUS

## 2018-08-01 MED ORDER — EPHEDRINE SULFATE-NACL 50-0.9 MG/10ML-% IV SOSY
PREFILLED_SYRINGE | INTRAVENOUS | Status: DC | PRN
  Administered 2018-08-01 (×3): 5 mg via INTRAVENOUS

## 2018-08-01 MED ORDER — PHYTONADIONE 5 MG PO TABS
5.0000 mg | ORAL_TABLET | Freq: Every day | ORAL | Status: AC
  Administered 2018-08-01 – 2018-08-03 (×3): 5 mg via ORAL
  Filled 2018-08-01 (×3): qty 1

## 2018-08-01 MED ORDER — PANTOPRAZOLE SODIUM 40 MG PO TBEC
40.0000 mg | DELAYED_RELEASE_TABLET | Freq: Every day | ORAL | Status: DC
  Administered 2018-08-02 – 2018-08-03 (×2): 40 mg via ORAL
  Filled 2018-08-01 (×2): qty 1

## 2018-08-01 MED ORDER — LIDOCAINE 4 % EX CREA
TOPICAL_CREAM | Freq: Once | CUTANEOUS | Status: DC
  Filled 2018-08-01: qty 5

## 2018-08-01 MED ORDER — DEXMEDETOMIDINE HCL 200 MCG/2ML IV SOLN
INTRAVENOUS | Status: DC | PRN
  Administered 2018-08-01: 8 ug via INTRAVENOUS
  Administered 2018-08-01: 20 ug via INTRAVENOUS
  Administered 2018-08-01: 12 ug via INTRAVENOUS

## 2018-08-01 MED ORDER — SODIUM CHLORIDE 0.9% IV SOLUTION
Freq: Once | INTRAVENOUS | Status: AC
  Administered 2018-08-01: 17:00:00 250 mL via INTRAVENOUS

## 2018-08-01 MED ORDER — PROPOFOL 500 MG/50ML IV EMUL
INTRAVENOUS | Status: DC | PRN
  Administered 2018-08-01: 75 ug/kg/min via INTRAVENOUS

## 2018-08-01 MED ORDER — LIDOCAINE HCL (CARDIAC) PF 100 MG/5ML IV SOSY
PREFILLED_SYRINGE | INTRAVENOUS | Status: DC | PRN
  Administered 2018-08-01: 50 mg via INTRAVENOUS

## 2018-08-01 SURGICAL SUPPLY — 15 items

## 2018-08-01 NOTE — Progress Notes (Signed)
Spanish interpreter number [phone] used for pre procedure translation.

## 2018-08-01 NOTE — Progress Notes (Signed)
Family Medicine Teaching Service Daily Progress Note Intern Pager: [phone]  Patient name: Mitchell [name] Medical record number: [id number] Date of birth: 02/02/1975 Age: 43 y.o. Gender: male  Primary Care Provider: Nicki GuadalajaraSimmons, Makiera, MD Consultants: GI Code Status: full  Pt Overview and Major Events to Date:  7/6- admitted with acute upper GI bleed  Assessment and Plan:  Acute upper GI bleed-improved. (possible varices with liver hx and no prior EGD). patient is hemodynamically stable at this time. Tachycardia resolved, s/p 2L boluses and 12000ml/hr fluid. Patient is not actively bleeding at this time. Feels nauseous but not vomiting. No more blood in stool per nurse. On exam- patient has acute tenderness with light palpation of RUQ and around to flank. Unable to assess liver 2/2 pain. No rashes noted. CT abdomen with IV contrast negative for free fluid/air and no evidence of active GI bleed. Lipase wnl, PT/INR mildly elevated. hgb from 8.6 on admission to 7.0 this am. - continue octreotide and protonix gtt pending GI evaluation - endoscopy today - f/u GI recs - continuous telemetry - continue IV maintenance - topical lidocaine for pain - CBC am  IDT2DM with complications- glucose 736 on admission. hgb A1c today 12.1, patient currently NPO in preparation for possible GI procedure. Sugars overnight were 258-312. He is on sliding scale inpatient, 70/30 outpatient. - mSSI - CBGs with meals and QHS - diabetic coordinator   Possible UTI- CT showed hypodensity on superior pole of left kidney suspicious of pyleonephritis and distended urinary bladder. No recent prior CT to compare. Patient denies dysuria. Urinalysis positive for large leukocytes and rare bacteria, negative nitrites, culture pending. Patient has ~1.5L UOP so does not appear to have retention. Normal WBC count, afebrile.  - started on CTX in ED (2230 on 7/6) +/- continuing.  - strict I/O - f/u urine culture and GC/chlamydia -  consult urology  H/o polysubstance abuse/EtOH- reports sobriety x8 years. CT showed stigmata of cirrhosis and splenomegaly. UDS positive for amphetamines, EtOH negtive. Unable to assess liver on exam 2/2 pain. RUQ US in 2017 showed fatty liver and/or hepatocellular dz. LFTs and platelets wnl, PT/INR slightly elevated. Pain has been 3-7 this admission - patient was given fentanyl in ED, and started on morphine 2mg  2qhr PRN - continue morphine for now and consider deescalating as tolerated when back on PO diet. Will investigate pain regimen in previous admissions.  Pseudohyponatremia. Na 134 today from 124. Improved with glucose control - BMP am  Diabetic foot ulcer- on exam does not appear infectious. Cool to the touch on R foot, poor circulation. - pain control - consider repeat ABI  Hyperlipidemia-  - continue home lipitor 40mg  when able to take PO  FEN/GI: NPO, 18400mL/hr NS, octreotide, protonix gtt PPx: SCDs, encourage activity  Disposition: dc pending GI workup  Subjective:  Patient endorses acute pain on RUQ. Positive nausea but no more vomiting or bleeding per rectum.  Objective: Temp:  [98.5 F (36.9 C)-98.6 F (37 C)] 98.5 F (36.9 C) (07/07 0745) Pulse Rate:  [88-128] 93 (07/07 0745) Resp:  [15-24] 15 (07/07 0745) BP: (90-146)/(54-93) 95/65 (07/07 0745) SpO2:  [96 %-100 %] 99 % (07/07 0745) Physical Exam: General: NAD Cardiovascular: RRR, no murmur appreciated Respiratory: no increased WOB, CTAB Abdomen: soft, no masses. Non-tender to other quadrants but is acutely tender to lightest touch on RUQ and round to flank. Did not appreciate any rash.  Extremities: R foot deformity- no active lesion. Exam unchanged from admission image (see h&p clinical image for  further detail)  Laboratory: Recent Labs  Lab 07/31/18 1539 07/31/18 1752 08/01/18 0249  WBC 9.4  --  5.9  HGB 8.6* 17.0 7.0*  HCT 23.2* 50.0 19.3*  PLT 152  --  121*   Recent Labs  Lab 07/31/18 1539  07/31/18 1752 08/01/18 0249  NA 124* 130* 134*  K 4.1 3.7 3.5  CL 91*  --  106  CO2 22  --  20*  BUN 28*  --  15  CREATININE 0.79  --  0.64  CALCIUM 8.3*  --  7.5*  PROT 5.8*  --  4.9*  BILITOT 1.2  --  1.1  ALKPHOS 84  --  78  ALT 23  --  21  AST 23  --  20  GLUCOSE 736*  --  312*   Hgb A1c 12.1 PT/INR- 15.9/1.3 ptt 29 LA 2.2>0.9  Imaging/Diagnostic Tests: Dg Chest Port 1 View  Result Date: 07/31/2018 CLINICAL DATA:  Chest pain and cough EXAM: PORTABLE CHEST 1 VIEW COMPARISON:  February 26, 2017 FINDINGS: Lungs are clear. Heart size and pulmonary vascularity are normal. No adenopathy. No pneumothorax. No bone lesions. IMPRESSION: No edema or consolidation. Electronically Signed   By: Lowella Grip III M.D.   On: 07/31/2018 19:01   Ct Angio Abdomen Pelvis  W &/or Wo Contrast  Result Date: 07/31/2018 CLINICAL DATA:  Abdominal pain, vomiting, suspect GI bleed EXAM: CTA ABDOMEN AND PELVIS WITHOUT AND WITH CONTRAST TECHNIQUE: Multidetector CT imaging of the abdomen and pelvis was performed using the standard protocol during bolus administration of intravenous contrast. Multiplanar reconstructed images and MIPs were obtained and reviewed to evaluate the vascular anatomy. CONTRAST:  134mL OMNIPAQUE IOHEXOL 350 MG/ML SOLN COMPARISON:  12/18/2011. FINDINGS: VASCULAR Normal contour and caliber of the abdominal aorta and branch vasculature. Standard branching pattern of the aorta. No significant atherosclerosis. Review of the MIP images confirms the above findings. NON-VASCULAR Lower chest: No acute abnormality. Hepatobiliary: No solid liver abnormality is seen. No gallstones, gallbladder wall thickening, or biliary dilatation. Pancreas: Unremarkable. No pancreatic ductal dilatation or surrounding inflammatory changes. Spleen: Splenomegaly, maximum coronal span 17.6 cm. Adrenals/Urinary Tract: Adrenal glands are unremarkable. Focal parenchymal hypodensity of the superior pole of the left kidney  (series 16, image 20). No calculus or hydronephrosis. Distended urinary bladder. Stomach/Bowel: Stomach is within normal limits. Appendix appears normal. No evidence of bowel wall thickening, distention, or inflammatory changes. Vascular/Lymphatic: No significant vascular findings are present. No enlarged abdominal or pelvic lymph nodes. Reproductive: No mass or other significant abnormality. Other: No abdominal wall hernia or abnormality. No abdominopelvic ascites. Musculoskeletal: No acute or significant osseous findings. 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 IMPRESSION: 1. No evidence of gastrointestinal bleed. No intraluminal localization of contrast on multiphasic GI bleed protocol examination to suggest nidus of bleed. 2. Focal parenchymal hypodensity of the superior pole of the left kidney (series 16, image 20), suspicious for pyelonephritis. Correlate with urinalysis. 3.  Distended urinary bladder.  Correlate for urinary retention. 4.  Stigmata of cirrhosis and splenomegaly. 5.  Sigmoid diverticulosis without evidence of acute diverticulitis. Electronically Signed   By: Eddie Candle M.D.   On: 07/31/2018 18:01    Richarda Osmond, DO 08/01/2018, 8:15 AM PGY-2, [place] Intern pager: [phone], text pages welcome

## 2018-08-01 NOTE — Anesthesia Procedure Notes (Signed)
Procedure Name: MAC Date/Time: 08/01/2018 12:04 PM Performed by: Mariea Clonts, CRNA Pre-anesthesia Checklist: Patient identified, Emergency Drugs available, Suction available, Patient being monitored and Timeout performed Patient Re-evaluated:Patient Re-evaluated prior to induction Oxygen Delivery Method: Nasal cannula

## 2018-08-01 NOTE — Progress Notes (Signed)
Writer tried to call family with updates on patient's status. Patient wife doesn't speak English and gave me her son's phone number [phone]). Patient's son didn't answer and writer left a message. Will continue to monitor.
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
## 2018-08-01 NOTE — Transfer of Care (Signed)
Immediate Anesthesia Transfer of Care Note  Patient: Mitchell [name]  Procedure(s) Performed: ESOPHAGOGASTRODUODENOSCOPY (EGD) WITH PROPOFOL (N/A ) ESOPHAGEAL BANDING  Patient Location: Endoscopy Unit  Anesthesia Type:MAC  Level of Consciousness: awake, alert  and oriented  Airway & Oxygen Therapy: Patient Spontanous Breathing and Patient connected to nasal cannula oxygen  Post-op Assessment: Report given to RN, Post -op Vital signs reviewed and stable and Patient moving all extremities X 4  Post vital signs: Reviewed and stable  Last Vitals:  Vitals Value Taken Time  BP 83/43 08/01/18 1230  Temp    Pulse 76 08/01/18 1231  Resp 16 08/01/18 1231  SpO2 100 % 08/01/18 1231  Vitals shown include unvalidated device data.  Last Pain:  Vitals:   08/01/18 1141  TempSrc: Oral  PainSc: 0-No pain      Patients Stated Pain Goal: 3 (65/03/54 6568)  Complications: No apparent anesthesia complications

## 2018-08-01 NOTE — Progress Notes (Signed)
Inpatient Diabetes Program Recommendations  AACE/ADA: New Consensus Statement on Inpatient Glycemic Control (2015)  Target Ranges:  Prepandial:   less than 140 mg/dL      Peak postprandial:   less than 180 mg/dL (1-2 hours)      Critically ill patients:  140 - 180 mg/dL     Spoke with patient via interpreter Dyane Dustman [phone]). Patient had not seen his PCP for over 1 year. Patient had been getting his insulin and DM supplies from [facility]. Patient was not checking glucose. Discussed importance of glucose control. Discussed A1c of 12.1% this admission. Discussed glucose and A1c goals.   Patient reports not eating consistently. Patient drinks water. Discussed consistent meal intake with portion control.  Patient reports he needs help getting syringes. Discussed with patient about him using [employer]. Overall patient will need follow up possibly at one of our clinics to also help get his insulin as well.  Spoke with Glenard Haring, RN CM about d/c planning for pt.  Thanks,  Tama Headings RN, MSN, BC-ADM Inpatient Diabetes Coordinator Team Pager [phone] (8a-5p)

## 2018-08-01 NOTE — Progress Notes (Signed)
CRITICAL VALUE ALERT  Critical Value:  Hgb 6.7  Date & Time Notied:  08/01/2018; 15:18  Provider Notified: Dr. Nori Riis  Orders Received/Actions taken:

## 2018-08-01 NOTE — Progress Notes (Signed)
Patient back from Endo; alert and oriented x 4; no acute distress noted, no complaints; VS stable; will continue to monitor.

## 2018-08-01 NOTE — Progress Notes (Signed)
Patient able to sign consent for EGD. Spanish material was printed and handed to patient with information about the procedure and [employer] interpreter was used to communicate with patient.

## 2018-08-01 NOTE — Op Note (Signed)
[facility] Patient Name: Mitchell [name] Procedure Date : 08/01/2018 MRN: [id number] Attending MD: Rachael Feeaniel P  , MD Date of Birth: 11/08/1975 CSN: [id number] Age: 43 Admit Type: Inpatient Procedure:                Upper GI endoscopy Indications:              hematemesis, melena, history of alcochol abuse,                            cirrhosis, crystal meth use Providers:                Rachael Feeaniel P. , MD, Zoe LanJenny Kappus, RN, Beryle BeamsJanie                            Billups, Technician, Albertina SenegalZachary S. Beckner, CRNA Referring MD:              Medicines:                Monitored Anesthesia Care Complications:            No immediate complications. Estimated blood loss:                            None. Estimated Blood Loss:     Estimated blood loss: none. Procedure:                Pre-Anesthesia Assessment:                           - Prior to the procedure, a History and Physical                            was performed, and patient medications and                            allergies were reviewed. The patient's tolerance of                            previous anesthesia was also reviewed. The risks                            and benefits of the procedure and the sedation                            options and risks were discussed with the patient.                            All questions were answered, and informed consent                            was obtained. Prior Anticoagulants: The patient has                            taken no previous anticoagulant or antiplatelet  agents. ASA Grade Assessment: IV - A patient with                            severe systemic disease that is a constant threat                            to life. After reviewing the risks and benefits,                            the patient was deemed in satisfactory condition to                            undergo the procedure.                           After obtaining informed  consent, the endoscope was                            passed under direct vision. Throughout the                            procedure, the patient's blood pressure, pulse, and                            oxygen saturations were monitored continuously. The                            GIF-H190 ([id number] Olympus gastroscope was                            introduced through the mouth, and advanced to the                            second part of duodenum. The upper GI endoscopy was                            accomplished without difficulty. The patient                            tolerated the procedure well. Scope In: Scope Out: Findings:      One broad trunk of non-bleeding Grade II varices were found in the lower       third of the esophagus. There were no stigmata of recent bleeding. Three       bands were successfully placed with complete eradication, resulting in       deflation of varices.      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 No gastric varices.      No blood in the UGI tract.      The exam was otherwise without abnormality. Impression:               - Esophageal varices and portal gastropathy noted.                            The varices are probably the source  of his recent                            overt bleeding and so I placed ligating bands at                            the site (3). Recommendation:           - Continue IV Octreotide for another 48 hours. IV                            Abx.                           - I will stop the PPI infusion, once daily PPI                            orally should suffice.                           - Will allow low salt diet for now.                           - Will follow along. Procedure Code(s):        --- Professional ---                           [phone], Esophagogastroduodenoscopy, flexible,                            transoral; with band ligation of esophageal/gastric                            varices Diagnosis Code(s):         --- Professional ---                           I85.00, Esophageal varices without bleeding                           D62, Acute posthemorrhagic anemia CPT copyright 2019 American Medical Association. All rights reserved. The codes documented in this report are preliminary and upon coder review may  be revised to meet current compliance requirements. Milus Banister, MD 08/01/2018 12:30:29 PM This report has been signed electronically. Number of Addenda: 0

## 2018-08-01 NOTE — Anesthesia Preprocedure Evaluation (Addendum)
Anesthesia Evaluation  Patient identified by MRN, date of birth, ID band Patient awake    Reviewed: Allergy & Precautions, NPO status , Patient's Chart, lab work & pertinent test results  Airway Mallampati: III  TM Distance: >3 FB Neck ROM: Full    Dental  (+) Poor Dentition, Chipped   Pulmonary neg pulmonary ROS,    Pulmonary exam normal breath sounds clear to auscultation       Cardiovascular negative cardio ROS Normal cardiovascular exam Rhythm:Regular Rate:Normal  ECG: ST   Neuro/Psych PSYCHIATRIC DISORDERS Depression negative neurological ROS     GI/Hepatic GERD  ,(+) Cirrhosis     substance abuse  , Hepatitis -  Endo/Other  diabetes, Insulin Dependent, Oral Hypoglycemic Agents  Renal/GU negative Renal ROS     Musculoskeletal negative musculoskeletal ROS (+)   Abdominal   Peds  Hematology  (+) anemia , HLD Thrombocytopenia   Anesthesia Other Findings cg emesis tarry stool  Reproductive/Obstetrics                            Anesthesia Physical Anesthesia Plan  ASA: III  Anesthesia Plan: MAC   Post-op Pain Management:    Induction: Intravenous  PONV Risk Score and Plan: 1 and Propofol infusion and Treatment may vary due to age or medical condition  Airway Management Planned: Nasal Cannula  Additional Equipment:   Intra-op Plan:   Post-operative Plan:   Informed Consent: I have reviewed the patients History and Physical, chart, labs and discussed the procedure including the risks, benefits and alternatives for the proposed anesthesia with the patient or authorized representative who has indicated his/her understanding and acceptance.     Dental advisory given  Plan Discussed with: CRNA  Anesthesia Plan Comments:         Anesthesia Quick Evaluation

## 2018-08-01 NOTE — Anesthesia Postprocedure Evaluation (Signed)
Anesthesia Post Note  Patient: Mitchell [name]  Procedure(s) Performed: ESOPHAGOGASTRODUODENOSCOPY (EGD) WITH PROPOFOL (N/A ) ESOPHAGEAL BANDING     Patient location during evaluation: Endoscopy Anesthesia Type: MAC Level of consciousness: awake and alert Pain management: pain level controlled Vital Signs Assessment: post-procedure vital signs reviewed and stable Respiratory status: spontaneous breathing, nonlabored ventilation, respiratory function stable and patient connected to nasal cannula oxygen Cardiovascular status: stable and blood pressure returned to baseline Postop Assessment: no apparent nausea or vomiting Anesthetic complications: no    Last Vitals:  Vitals:   08/01/18 1653 08/01/18 1722  BP: 112/72 110/65  Pulse: 80 78  Resp: 19 16  Temp: 37.1 C 36.8 C  SpO2: 100% 99%    Last Pain:  Vitals:   08/01/18 1750  TempSrc:   PainSc: Asleep                 Ryan P Ellender

## 2018-08-02 ENCOUNTER — Encounter (HOSPITAL_COMMUNITY): Payer: Self-pay | Admitting: Gastroenterology

## 2018-08-02 DIAGNOSIS — R9389 Abnormal findings on diagnostic imaging of other specified body structures: Secondary | ICD-10-CM

## 2018-08-02 DIAGNOSIS — R161 Splenomegaly, not elsewhere classified: Secondary | ICD-10-CM

## 2018-08-02 DIAGNOSIS — E860 Dehydration: Secondary | ICD-10-CM

## 2018-08-02 DIAGNOSIS — D62 Acute posthemorrhagic anemia: Secondary | ICD-10-CM

## 2018-08-02 DIAGNOSIS — K766 Portal hypertension: Secondary | ICD-10-CM

## 2018-08-02 LAB — CBC
HCT: 24 % — ABNORMAL LOW (ref 39.0–52.0)
Hemoglobin: 8.5 g/dL — ABNORMAL LOW (ref 13.0–17.0)
MCH: 31.4 pg (ref 26.0–34.0)
MCHC: 35.4 g/dL (ref 30.0–36.0)
MCV: 88.6 fL (ref 80.0–100.0)
Platelets: 116 10*3/uL — ABNORMAL LOW (ref 150–400)
RBC: 2.71 MIL/uL — ABNORMAL LOW (ref 4.22–5.81)
RDW: 15.9 % — ABNORMAL HIGH (ref 11.5–15.5)
WBC: 4.1 10*3/uL (ref 4.0–10.5)
nRBC: 0 % (ref 0.0–0.2)

## 2018-08-02 LAB — RPR: RPR Ser Ql: NONREACTIVE

## 2018-08-02 LAB — BPAM RBC
Blood Product Expiration Date: 202007312359
ISSUE DATE / TIME: 202007071658
Unit Type and Rh: 6200

## 2018-08-02 LAB — TYPE AND SCREEN
ABO/RH(D): A POS
Antibody Screen: NEGATIVE
Unit division: 0

## 2018-08-02 LAB — COMPREHENSIVE METABOLIC PANEL
ALT: 18 U/L (ref 0–44)
AST: 22 U/L (ref 15–41)
Albumin: 2.3 g/dL — ABNORMAL LOW (ref 3.5–5.0)
Alkaline Phosphatase: 70 U/L (ref 38–126)
Anion gap: 9 (ref 5–15)
BUN: 6 mg/dL (ref 6–20)
CO2: 20 mmol/L — ABNORMAL LOW (ref 22–32)
Calcium: 7.2 mg/dL — ABNORMAL LOW (ref 8.9–10.3)
Chloride: 105 mmol/L (ref 98–111)
Creatinine, Ser: 0.69 mg/dL (ref 0.61–1.24)
GFR calc Af Amer: >60 mL/min (ref >60)
GFR calc non Af Amer: >60 mL/min (ref >60)
Glucose, Bld: 243 mg/dL — ABNORMAL HIGH (ref 70–99)
Potassium: 3.2 mmol/L — ABNORMAL LOW (ref 3.5–5.1)
Sodium: 134 mmol/L — ABNORMAL LOW (ref 135–145)
Total Bilirubin: 1 mg/dL (ref 0.3–1.2)
Total Protein: 4.8 g/dL — ABNORMAL LOW (ref 6.5–8.1)

## 2018-08-02 LAB — HCV INTERPRETATION

## 2018-08-02 LAB — HIV ANTIBODY (ROUTINE TESTING W REFLEX): HIV Screen 4th Generation wRfx: NONREACTIVE

## 2018-08-02 LAB — HEPATITIS B SURFACE ANTIGEN: Hepatitis B Surface Ag: NEGATIVE

## 2018-08-02 LAB — HEPATITIS B SURFACE ANTIBODY, QUANTITATIVE: Hep B S AB Quant (Post): <3.1 m[IU]/mL — ABNORMAL LOW (ref >9.9)

## 2018-08-02 LAB — GLUCOSE, CAPILLARY
Glucose-Capillary: 243 mg/dL — ABNORMAL HIGH (ref 70–99)
Glucose-Capillary: 231 mg/dL — ABNORMAL HIGH (ref 70–99)
Glucose-Capillary: 210 mg/dL — ABNORMAL HIGH (ref 70–99)
Glucose-Capillary: 240 mg/dL — ABNORMAL HIGH (ref 70–99)

## 2018-08-02 LAB — HCV AB W REFLEX TO QUANT PCR: HCV Ab: <0.1 s/co ratio (ref 0.0–0.9)

## 2018-08-02 MED ORDER — TRAMADOL HCL 50 MG PO TABS
50.0000 mg | ORAL_TABLET | Freq: Once | ORAL | Status: AC
  Administered 2018-08-02: 50 mg via ORAL
  Filled 2018-08-02: qty 1

## 2018-08-02 MED ORDER — HEPARIN SODIUM (PORCINE) 5000 UNIT/ML IJ SOLN: 5000.0000 [IU] | Freq: Three times a day (TID) | INTRAMUSCULAR | Status: DC

## 2018-08-02 MED ORDER — POTASSIUM CHLORIDE 20 MEQ PO PACK
40.0000 meq | PACK | Freq: Once | ORAL | Status: AC
  Administered 2018-08-02: 40 meq via ORAL
  Filled 2018-08-02: qty 2

## 2018-08-02 MED ORDER — AMOXICILLIN 500 MG PO CAPS
500.0000 mg | ORAL_CAPSULE | Freq: Three times a day (TID) | ORAL | Status: DC
  Administered 2018-08-02 – 2018-08-03 (×3): 500 mg via ORAL
  Filled 2018-08-02 (×3): qty 1

## 2018-08-02 MED ORDER — INSULIN ASPART PROT & ASPART (70-30 MIX) 100 UNIT/ML ~~LOC~~ SUSP
10.0000 [IU] | Freq: Every day | SUBCUTANEOUS | Status: DC
  Administered 2018-08-02: 10:00:00 10 [IU] via SUBCUTANEOUS
  Filled 2018-08-02: qty 10

## 2018-08-02 MED ORDER — GABAPENTIN 300 MG PO CAPS
300.0000 mg | ORAL_CAPSULE | Freq: Three times a day (TID) | ORAL | Status: DC
  Administered 2018-08-02 – 2018-08-03 (×5): 300 mg via ORAL
  Filled 2018-08-02 (×6): qty 1

## 2018-08-02 MED ORDER — ACETAMINOPHEN 500 MG PO TABS
500.0000 mg | ORAL_TABLET | ORAL | Status: DC | PRN
  Administered 2018-08-02 – 2018-08-03 (×3): 500 mg via ORAL
  Filled 2018-08-02 (×2): qty 1

## 2018-08-02 MED ORDER — BISACODYL 5 MG PO TBEC
10.0000 mg | DELAYED_RELEASE_TABLET | Freq: Once | ORAL | Status: AC
  Administered 2018-08-02: 13:00:00 10 mg via ORAL
  Filled 2018-08-02: qty 2

## 2018-08-02 NOTE — Evaluation (Signed)
Physical Therapy Evaluation Patient Details Name: Mitchell [name] MRN: [id number] DOB: 1975-02-11 Today's Date: 08/02/2018   History of Present Illness  43 yo male with onset of GI bleed w/N&V and tachycardia from dehydration was admitted, noted DM uncontrolled and UTI present with suspected pyelonephritis, SIRS.  Has foot ulcer with eschar, painful to wb.  PHx:  DM, depression, HLD, cellulitis, partial R foot amputation, EtOH cirrhosis, polysubstance abuse,   Clinical Impression  Pt was seen for evaluation of his tolerance for gait and mobility which was hindered by painful R foot in WB and chest/neck pain from emergent care of intubation and CPR.  Pt is expecting to go home with his son and girlfriend, and is expecting them to be available around the clock due to their work schedules.  He will be seen acutely for strengthening, and asked nursing to refer to wound care for guidance on protecting the R foot ulcer as it is exposed to irritation with gait.  Pt is in agreement with POC via translator, and will work toward his return home with family with HHPT.      Follow Up Recommendations Home health PT;Supervision for mobility/OOB    Equipment Recommendations  Rolling walker with 5" wheels    Recommendations for Other Services       Precautions / Restrictions Precautions Precautions: Fall Precaution Comments: monitor vitals with telemetry Restrictions Weight Bearing Restrictions: No Other Position/Activity Restrictions: may need protective dressing and shoe for R foot      Mobility  Bed Mobility               General bed mobility comments: up in chair when PT arrived  Transfers Overall transfer level: Needs assistance Equipment used: Rolling walker (2 wheeled);1 person hand held assist Transfers: Sit to/from Stand Sit to Stand: Min assist         General transfer comment: assisted with powering up and pt was able to steady on walker  Ambulation/Gait Ambulation/Gait  assistance: Min guard Gait Distance (Feet): 40 Feet Assistive device: Rolling walker (2 wheeled);1 person hand held assist Gait Pattern/deviations: Step-through pattern;Shuffle;Decreased stride length Gait velocity: reduced   General Gait Details: pt took his time due to painful chest and throat from his CPR and intubation recently  Stairs            Wheelchair Mobility    Modified Rankin (Stroke Patients Only)       Balance Overall balance assessment: Needs assistance Sitting-balance support: Feet supported Sitting balance-Leahy Scale: Good     Standing balance support: Bilateral upper extremity supported;During functional activity Standing balance-Leahy Scale: Fair                               Pertinent Vitals/Pain Pain Assessment: 0-10 Pain Score: 7  Pain Location: chest, neck and R foot Pain Intervention(s): Limited activity within patient's tolerance;Monitored during session;Repositioned;Patient requesting pain meds-RN notified    Home Living Family/patient expects to be discharged to:: Private residence Living Arrangements: Spouse/significant other;Children Available Help at Discharge: Family Type of Home: House Home Access: Stairs to enter   Technical brewer of Steps: 4 Home Layout: One level Home Equipment: None Additional Comments: pt reports his girlfriend is there in PM and son in AM, alone one hour a day    Prior Function Level of Independence: Independent         Comments: no AD used per pt     Hand Dominance  Dominant Hand: Right    Extremity/Trunk Assessment   Upper Extremity Assessment Upper Extremity Assessment: Generalized weakness LUE Deficits / Details: limited tolerance from recent intubation LUE Coordination: decreased gross motor    Lower Extremity Assessment Lower Extremity Assessment: Generalized weakness    Cervical / Trunk Assessment Cervical / Trunk Assessment: Normal  Communication    Communication: Prefers language other than [place];Other (comment)(Spanish translator used)  Cognition Arousal/Alertness: Awake/alert Behavior During Therapy: WFL for tasks assessed/performed Overall Cognitive Status: Within Functional Limits for tasks assessed                                 General Comments: answers appropriately with translation      General Comments General comments (skin integrity, edema, etc.): pt was seen for mobility and note his difficulty due to R foot ulcer, recent emergent medical care with resulting upper chest and neck pain, as well as ongoing cardiac symptoms.  Pt is expecting to go home as he has family there.    Exercises     Assessment/Plan    PT Assessment Patient needs continued PT services  PT Problem List Decreased strength;Decreased range of motion;Decreased activity tolerance;Decreased balance;Decreased mobility;Decreased coordination;Decreased knowledge of use of DME;Decreased safety awareness;Decreased knowledge of precautions;Cardiopulmonary status limiting activity;Decreased skin integrity;Pain       PT Treatment Interventions DME instruction;Gait training;Stair training;Functional mobility training;Therapeutic activities;Therapeutic exercise;Balance training;Neuromuscular re-education;Patient/family education    PT Goals (Current goals can be found in the Care Plan section)  Acute Rehab PT Goals Patient Stated Goal: via translator wants to get pain to be relieved PT Goal Formulation: With patient Time For Goal Achievement: 08/16/18 Potential to Achieve Goals: Good    Frequency Min 4X/week   Barriers to discharge Inaccessible home environment;Decreased caregiver support home with family to assist    Co-evaluation               AM-PAC PT "6 Clicks" Mobility  Outcome Measure Help needed turning from your back to your side while in a flat bed without using bedrails?: None Help needed moving from lying on your back  to sitting on the side of a flat bed without using bedrails?: A Little Help needed moving to and from a bed to a chair (including a wheelchair)?: A Little Help needed standing up from a chair using your arms (e.g., wheelchair or bedside chair)?: A Little Help needed to walk in hospital room?: A Little Help needed climbing 3-5 steps with a railing? : A Lot 6 Click Score: 18    End of Session Equipment Utilized During Treatment: Gait belt;Other (comment)(translation cart for entire session) Activity Tolerance: Patient limited by pain;Treatment limited secondary to medical complications (Comment)(light headed initially then felt better after sitting, water) Patient left: in chair;with call bell/phone within reach;with nursing/sitter in room Nurse Communication: Mobility status PT Visit Diagnosis: Unsteadiness on feet (R26.81);Muscle weakness (generalized) (M62.81);Difficulty in walking, not elsewhere classified (R26.2);Pain Pain - Right/Left: Right Pain - part of body: Ankle and joints of foot(chest)    Time: [id number] PT Time Calculation (min) (ACUTE ONLY): 39 min   Charges:   PT Evaluation $PT Eval Moderate Complexity: 1 Mod PT Treatments $Gait Training: 8-22 mins $Therapeutic Exercise: 8-22 mins       Ivar DrapeRuth E Garl Speigner 08/02/2018, 3:35 PM  Samul Dadauth Cassandr Cederberg, PT MS Acute Rehab Dept. Number: [facility] [phone] and [facility] [phone]

## 2018-08-02 NOTE — Discharge Summary (Signed)
[facility] Discharge Summary  Patient name: Mitchell [name] Medical record number: [id number] Date of birth: 1975/03/04 Age: 43 y.o. Gender: male Date of Admission: 07/31/2018  Date of Discharge: 08/03/2018 Admitting Physician: Dickie La, MD  Primary Care Provider: Stark Klein, MD Consultants: GI  Indication for Hospitalization: variceal bleed  Discharge Diagnoses/Problem List:  Variceal bleed (resolved) Poorly controlled IDT2DM  Asymptomatic GBS UTI H/o EtOH abuse Amphetamine positive on admission Diabetic foot ulcer HLD  Disposition: discharge home  Discharge Condition: resolved, improved  Discharge Exam: (from daily progress note) General: NAD Cardiovascular: RRR, no murmur appreciated Respiratory: no increased WOB, CTAB Abdomen: soft, no masses. Mildly tender to palpation epigastric region Derm: skin is warm to the touch diffusely, afebrile. Extremities: R foot deformity- no active lesion. Exam unchanged from admission image (see h&p clinical image for further detail). SCDs in place  Brief Hospital Course:  Patient admitted with positive hematemesis and melena. Active bleed stopped with IV octreotide and protonix. Urgent EGD revealed non-bleeding varices which was banded and deflated. Patient hemodynamically stable throughout admission, however, his hgb dropped to 6.7 so was transfused with 1u pRBC. After transfusion, hgb remained stable ~8.5 and patient was asymptomatic. He was continued on IV octreotide for 48 hours after the banding and continued on PO protonix. hgb 8.4 on day of discharge.   Issues for Follow Up:  1. Follow up with PCP for repeat hgb to ensure stable after transfusion 2. Follow up with GI in 3-4 weeks outpatient to schedule  1. Discussed with GI attending for recommendation on starting a BB for variceal bleed ppx and they recommended against since patient has poor history of follow up.  Significant Procedures: EGD 7/7  with variceal banding  Significant Labs and Imaging:  Recent Labs  Lab 08/01/18 2241 08/02/18 0255 08/03/18 0322  WBC 4.5 4.1 3.8*  HGB 8.4* 8.5* 8.4*  HCT 23.5* 24.0* 23.6*  PLT 119* 116* 122*   Recent Labs  Lab 07/31/18 1539 07/31/18 1752 08/01/18 0249 08/02/18 0255 08/03/18 0322  NA 124* 130* 134* 134* 133*  K 4.1 3.7 3.5 3.2* 3.0*  CL 91*  --  106 105 102  CO2 22  --  20* 20* 23  GLUCOSE 736*  --  312* 243* 232*  BUN 28*  --  15 6 <5*  CREATININE 0.79  --  0.64 0.69 0.56*  CALCIUM 8.3*  --  7.5* 7.2* 7.4*  ALKPHOS 84  --  78 70  --   AST 23  --  20 22  --   ALT 23  --  21 18  --   ALBUMIN 2.9*  --  2.5* 2.3*  --    PT/INR- 15.3/1.2  Results/Tests Pending at Time of Discharge: GC/chlamydia urine  Discharge Medications:  Allergies as of 08/03/2018   No Known Allergies     Medication List    STOP taking these medications   insulin glargine 100 UNIT/ML injection Commonly known as: LANTUS     TAKE these medications   acetaminophen 325 MG tablet Commonly known as: TYLENOL Take 2 tablets (650 mg total) by mouth every 6 (six) hours as needed for mild pain (or Fever >/= 101).   amoxicillin 500 MG capsule Commonly known as: AMOXIL Take 1 capsule (500 mg total) by mouth every 8 (eight) hours for 1 day.   atorvastatin 40 MG tablet Commonly known as: LIPITOR Take 1 tablet (40 mg total) by mouth daily.   gabapentin 300 MG capsule  Commonly known as: NEURONTIN Take 1 capsule (300 mg total) by mouth 3 (three) times daily.   insulin aspart protamine- aspart (70-30) 100 UNIT/ML injection Commonly known as: NOVOLOG MIX 70/30 Inject 0.2 mLs (20 Units total) into the skin daily with breakfast. What changed: Another medication with the same name was removed. Continue taking this medication, and follow the directions you see here.   metFORMIN 1000 MG tablet Commonly known as: GLUCOPHAGE Take 1 tablet (1,000 mg total) by mouth 2 (two) times daily with a meal. What  changed: when to take this   multivitamin with minerals Tabs tablet Take 1 tablet by mouth daily. For for low vitamin   nitroGLYCERIN 0.4 MG SL tablet Commonly known as: NITROSTAT Place 1 tablet (0.4 mg total) under the tongue every 5 (five) minutes as needed for chest pain.   pantoprazole 40 MG tablet Commonly known as: PROTONIX Take 1 tablet (40 mg total) by mouth daily at 6 (six) AM. Start taking on: August 04, 2018   traMADol 50 MG tablet Commonly known as: ULTRAM Take 1 tablet (50 mg total) by mouth every 8 (eight) hours as needed for moderate pain or severe pain. What changed:   how much to take  when to take this  reasons to take this       Discharge Instructions: Please refer to Patient Instructions section of EMR for full details.  Patient was counseled important signs and symptoms that should prompt return to medical care, changes in medications, dietary instructions, activity restrictions, and follow up appointments.   Follow-Up Appointments: Follow-up Information    [facility]. Go on 08/24/2018.   Why: 3:30pm with Dr. Vernie MurdersFult. Hospital follow up and assistance with medications.  Contact information: [address] [place] [id number] [phone]        follow up with GI in 3-4 weeks.  Leeroy BockAnderson, Oliviana Mcgahee L, DO 08/03/2018, 4:26 PM PGY-2, [facility]

## 2018-08-02 NOTE — Plan of Care (Signed)
  Problem: Education: Goal: Knowledge of General Education information will improve Description: Including pain rating scale, medication(s)/side effects and non-pharmacologic comfort measures Outcome: Progressing   Problem: Clinical Measurements: Goal: Ability to maintain clinical measurements within normal limits will improve Outcome: Progressing   

## 2018-08-02 NOTE — Progress Notes (Signed)
Utilized interpreter session number [phone] to complete morning assessment.

## 2018-08-02 NOTE — Progress Notes (Addendum)
Family Medicine Teaching Service Daily Progress Note Intern Pager: [phone]  Patient name: Mitchell [name] Medical record number: [id number] Date of birth: 07/08/1975 Age: 43 y.o. Gender: male  Primary Care Provider: Nicki GuadalajaraSimmons, Makiera, MD Consultants: GI Code Status: full  Pt Overview and Major Events to Date:  7/6- admitted with acute upper GI bleed  Assessment and Plan:  Acute upper GI bleed-improved. S/p EGD yesterday which showed no active bleeding. Grade II varices, bands were placed with complete eradication and deflation of varices. hgb dropped to 6.7 after procedure and received 1upRBC. Post transfusion hgb improved to 8.4, this am stable at 8.5. abdomen is again tender to very light palpation but diffusely in his body now. Does appear to have some mild anasarca now which could be contributing so dc his fluids. - f/u GI recs  - continue IV octreotide at variceal dose (48hrs post EGD) - continue PO protonix - continuous telemetry - continue IV maintenance - topical lidocaine for pain - CBC am  Poorly controlled IDT2DM with complications- CBGs in lower 200s overnight. On sliding scale- received 19u novolog yesterday (was NPO for procedure). Begin adding back on his home regimen of insulin today. - mSSI - started back half home dose of 70/30 (10 units today) - CBGs with meals and QHS - diabetic coordinator working with CM for insulin regimen outpatient, appreciate the help  GBS UTI- CT showed hypodensity on superior pole of left kidney suspicious of pyleonephritis and distended urinary bladder. No recent prior CT to compare. Patient denies dysuria. Urinalysis positive for large leukocytes and rare bacteria, negative nitrites, culture positive for group B strep. Patient has ~0.9L UOP so does not appear to have retention. Dc CTX today and switch to amp for 5 total days of tx. Normal WBC count, afebrile. Discontinued IV fluids. - started on CTX in ED (2230 on 7/6) dc  - amoxicillin  500mg  TID (7/08-31-08) - strict I/O - f/u urine GC/chlamydia (collected) - consulted urology yesterday  H/o polysubstance abuse/EtOH- reports sobriety x8 years. CT showed stigmata of cirrhosis and splenomegaly. UDS positive for amphetamines, EtOH negtive. Pain has been 0-7 overnight. Tenderness to light touch concerns me that it could be a sign of acute withdrawal from amphetamines but pt denies frequent/chronic use. - tylenol PRN for pain - gabapentin 300mg  TID  Pseudohyponatremia. Na 134 stable today. Improved with glucose control - BMP am  Diabetic foot ulcer- on exam does not appear infectious. Warmer to palpation today but his whole body felt the same temperature.  - pain control- tylenol, gabapentin - PT consult - consider repeat ABI  Hyperlipidemia-  - continue home lipitor 40mg  when able to take PO  FEN/GI: diabetic diet, octreotide, protonix gtt PPx: SCDs, encourage activity, consider subq heparin for DVT prophylaxis.   Disposition: dc pending GI workup  Subjective:  Patient endorses pain everywhere. He describes it as a numbness/tingling worse in his hands and legs. Has some nausea but no vomiting.   Objective: Temp:  [97.8 F (36.6 C)-98.7 F (37.1 C)] 98.5 F (36.9 C) (07/07 2151) Pulse Rate:  [75-93] 83 (07/07 2151) Resp:  [13-19] 16 (07/07 2151) BP: (83-124)/(42-78) 124/78 (07/07 2151) SpO2:  [97 %-100 %] 97 % (07/07 2151) Physical Exam: General: NAD, mildly agitated Cardiovascular: RRR, no murmur appreciated Respiratory: no increased WOB, CTAB Abdomen: soft, no masses. Did not appreciate any rash. Tender to light brushing of the skin diffusely, did not palpate deep Derm: skin is warm to the touch diffusely, afebrile. Extremities: R foot deformity-  no active lesion. Exam unchanged from admission image (see h&p clinical image for further detail).   Laboratory: Recent Labs  Lab 08/01/18 1439 08/01/18 2241 08/02/18 0255  WBC 4.0 4.5 4.1  HGB 6.7* 8.4* 8.5*   HCT 18.8* 23.5* 24.0*  PLT 109* 119* 116*   Recent Labs  Lab 07/31/18 1539 07/31/18 1752 08/01/18 0249 08/02/18 0255  NA 124* 130* 134* 134*  K 4.1 3.7 3.5 3.2*  CL 91*  --  106 105  CO2 22  --  20* 20*  BUN 28*  --  15 6  CREATININE 0.79  --  0.64 0.69  CALCIUM 8.3*  --  7.5* 7.2*  PROT 5.8*  --  4.9* 4.8*  BILITOT 1.2  --  1.1 1.0  ALKPHOS 84  --  78 70  ALT 23  --  21 18  AST 23  --  20 22  GLUCOSE 736*  --  312* 243*   Hgb A1c 12.1 PT/INR- 15.9/1.3 ptt 29 LA 2.2>0.9  Imaging/Diagnostic Tests: Dg Chest Port 1 View  Result Date: 07/31/2018 CLINICAL DATA:  Chest pain and cough EXAM: PORTABLE CHEST 1 VIEW COMPARISON:  February 26, 2017 FINDINGS: Lungs are clear. Heart size and pulmonary vascularity are normal. No adenopathy. No pneumothorax. No bone lesions. IMPRESSION: No edema or consolidation. Electronically Signed   By: Lowella Grip III M.D.   On: 07/31/2018 19:01   Ct Angio Abdomen Pelvis  W &/or Wo Contrast  Result Date: 07/31/2018 CLINICAL DATA:  Abdominal pain, vomiting, suspect GI bleed EXAM: CTA ABDOMEN AND PELVIS WITHOUT AND WITH CONTRAST TECHNIQUE: Multidetector CT imaging of the abdomen and pelvis was performed using the standard protocol during bolus administration of intravenous contrast. Multiplanar reconstructed images and MIPs were obtained and reviewed to evaluate the vascular anatomy. CONTRAST:  185mL OMNIPAQUE IOHEXOL 350 MG/ML SOLN COMPARISON:  12/18/2011. FINDINGS: VASCULAR Normal contour and caliber of the abdominal aorta and branch vasculature. Standard branching pattern of the aorta. No significant atherosclerosis. Review of the MIP images confirms the above findings. NON-VASCULAR Lower chest: No acute abnormality. Hepatobiliary: No solid liver abnormality is seen. No gallstones, gallbladder wall thickening, or biliary dilatation. Pancreas: Unremarkable. No pancreatic ductal dilatation or surrounding inflammatory changes. Spleen: Splenomegaly, maximum  coronal span 17.6 cm. Adrenals/Urinary Tract: Adrenal glands are unremarkable. Focal parenchymal hypodensity of the superior pole of the left kidney (series 16, image 20). No calculus or hydronephrosis. Distended urinary bladder. Stomach/Bowel: Stomach is within normal limits. Appendix appears normal. No evidence of bowel wall thickening, distention, or inflammatory changes. Vascular/Lymphatic: No significant vascular findings are present. No enlarged abdominal or pelvic lymph nodes. Reproductive: No mass or other significant abnormality. Other: No abdominal wall hernia or abnormality. No abdominopelvic ascites. Musculoskeletal: No acute or significant osseous findings. 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 IMPRESSION: 1. No evidence of gastrointestinal bleed. No intraluminal localization of contrast on multiphasic GI bleed protocol examination to suggest nidus of bleed. 2. Focal parenchymal hypodensity of the superior pole of the left kidney (series 16, image 20), suspicious for pyelonephritis. Correlate with urinalysis. 3.  Distended urinary bladder.  Correlate for urinary retention. 4.  Stigmata of cirrhosis and splenomegaly. 5.  Sigmoid diverticulosis without evidence of acute diverticulitis. Electronically Signed   By: Eddie Candle M.D.   On: 07/31/2018 18:01    Richarda Osmond, DO 08/02/2018, 6:45 AM PGY-2, [place] Intern pager: [phone], text pages welcome

## 2018-08-02 NOTE — Progress Notes (Addendum)
Daily Rounding Note  08/02/2018, 11:49 AM  LOS: 2 days   SUBJECTIVE:   Chief complaint: Cirrhosis of the liver.  Coffee like emesis.  Nonbleeding esophageal varices.  Portal hypertensive gastropathy.     Complaining of pain in his throat down through his chest.  Also continues to have pain in his right abdomen which started prior to his arrival.  He is asking if he can have a laxative because it is been a couple of days since he last had a bowel movement.  OBJECTIVE:         Vital signs in last 24 hours:    Temp:  [97.8 F (36.6 C)-98.7 F (37.1 C)] 98 F (36.7 C) (07/08 0840) Pulse Rate:  [75-89] 85 (07/08 0840) Resp:  [13-19] 17 (07/08 0840) BP: (83-140)/(42-80) 140/80 (07/08 0840) SpO2:  [97 %-100 %] 99 % (07/08 0840) Last BM Date: 08/02/18 There were no vitals filed for this visit. General: Does not look ill.  Sitting up at the bedside chair. Heart: RRR. Chest: Clear bilaterally.  No labored breathing. Abdomen: Active bowel sounds.  Nondistended.  Tenderness on the right abdomen without guarding or rebound. Extremities: No CCE. Neuro/Psych: cooperative, alert.  Appropriate.    Intake/Output from previous day: 07/07 0701 - 07/08 0700 In: 1612 [P.O.:60; I.V.:1207; Blood:345] Out: 900 [Urine:900]  Intake/Output this shift: Total I/O In: 480 [P.O.:480] Out: 1100 [Urine:1100]  Lab Results: Recent Labs    08/01/18 1439 08/01/18 2241 08/02/18 0255  WBC 4.0 4.5 4.1  HGB 6.7* 8.4* 8.5*  HCT 18.8* 23.5* 24.0*  PLT 109* 119* 116*   BMET Recent Labs    07/31/18 1539 07/31/18 1752 08/01/18 0249 08/02/18 0255  NA 124* 130* 134* 134*  K 4.1 3.7 3.5 3.2*  CL 91*  --  106 105  CO2 22  --  20* 20*  GLUCOSE 736*  --  312* 243*  BUN 28*  --  15 6  CREATININE 0.79  --  0.64 0.69  CALCIUM 8.3*  --  7.5* 7.2*   LFT Recent Labs    07/31/18 1539 08/01/18 0249 08/02/18 0255  PROT 5.8* 4.9* 4.8*  ALBUMIN  2.9* 2.5* 2.3*  AST 23 20 22   ALT 23 21 18   ALKPHOS 84 78 70  BILITOT 1.2 1.1 1.0   PT/INR Recent Labs    07/31/18 1539 08/01/18 0249  LABPROT 15.8* 15.9*  INR 1.3* 1.3*   Hepatitis Panel Recent Labs    08/01/18 0916  HEPBSAG Negative    Studies/Results: Dg Chest Port 1 View  Result Date: 07/31/2018 CLINICAL DATA:  Chest pain and cough EXAM: PORTABLE CHEST 1 VIEW COMPARISON:  February 26, 2017 FINDINGS: Lungs are clear. Heart size and pulmonary vascularity are normal. No adenopathy. No pneumothorax. No bone lesions. IMPRESSION: No edema or consolidation. Electronically Signed   By: William  Woodruff III M.D.   On: 07/31/2018 19:01   Ct Angio Abdomen Pelvis  W &/or Wo Contrast  Result Date: 07/31/2018 CLINICAL DATA:  Abdominal pain, vomiting, suspect GI bleed EXAM: CTA ABDOMEN AND PELVIS WITHOUT AND WITH CONTRAST TECHNIQUE: Multidetector CT imaging of the abdomen and pelvis was performed using the standard protocol during bolus administration of intravenous contrast. Multiplanar reconstructed images and MIPs were obtained and reviewed to evaluate the vascular anatomy. CONTRAST:  <MEASUREMAnnetOllen BoPRunner, broadcasting/film/KensingtoMarland Kitche<MEASUREMENAnnetOllen BoPRunner, broadcasting/film/FiMarland Kitche<MEASUREMENAnnetOllen BoPRunner, broadcasting/film/Marland Kitche<MEASUREMENAOllen BoPRunner, broadcasting/film/Marland Kitche<MEASUREMENAnnetOllen BoPRunner, broadcasting/filMarland Kitche<MEASUREMENAnnetOllen BoPRunner, broadcasting/film/AcresMarland Kitche<MEASUREMENAnnetOllen BoPRunner, broadcasting/film/CovMarland Kitche<MEASUREMENAnnetOllen BoPRunner, broadcasting/filMarland Kitche<MEASUREMENAnnetOllen BoPRunner, broadcasting/film/RMarland Kitche<MEASUREMENAnnetOllen BoPRunner, broadcasting/film/BruMarland Kitche<MEASUREMENAnnetOllen BoPRunner, broadcasting/film/Marland Kitche<MEASUREMENAnnetOllen BoPRunner, broadcasting/film/HorseshoMarland Kitchene Bende DredgeseerXOL 350 MG/ML SOLN COMPARISON:  12/18/2011. FINDINGS: VASCULAR Normal contour and caliber of the abdominal aorta and branch vasculature. Standard branching pattern of  the aorta. No significant atherosclerosis. Review of the MIP images confirms the above findings. NON-VASCULAR Lower chest: No acute abnormality. Hepatobiliary: No solid liver abnormality is seen. No gallstones, gallbladder wall thickening, or biliary dilatation. Pancreas: Unremarkable. No pancreatic ductal dilatation or surrounding inflammatory changes. Spleen: Splenomegaly, maximum coronal span 17.6 cm. Adrenals/Urinary Tract: Adrenal glands are unremarkable. Focal parenchymal hypodensity of the superior pole of the left kidney (series 16, image 20). No calculus or hydronephrosis. Distended urinary bladder. Stomach/Bowel: Stomach is  within normal limits. Appendix appears normal. No evidence of bowel wall thickening, distention, or inflammatory changes. Vascular/Lymphatic: No significant vascular findings are present. No enlarged abdominal or pelvic lymph nodes. Reproductive: No mass or other significant abnormality. Other: No abdominal wall hernia or abnormality. No abdominopelvic ascites. Musculoskeletal: No acute or significant osseous findings. 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 IMPRESSION: 1. No evidence of gastrointestinal bleed. No intraluminal localization of contrast on multiphasic GI bleed protocol examination to suggest nidus of bleed. 2. Focal parenchymal hypodensity of the superior pole of the left kidney (series 16, image 20), suspicious for pyelonephritis. Correlate with urinalysis. 3.  Distended urinary bladder.  Correlate for urinary retention. 4.  Stigmata of cirrhosis and splenomegaly. 5.  Sigmoid diverticulosis without evidence of acute diverticulitis. Electronically Signed   By: Eddie Candle M.D.   On: 07/31/2018 18:01   Scheduled Meds:  amoxicillin  500 mg Oral Q8H   atorvastatin  40 mg Oral Daily   gabapentin  300 mg Oral TID   insulin aspart  0-15 Units Subcutaneous TID WC   insulin aspart protamine- aspart  10 Units Subcutaneous Q breakfast   lidocaine   Topical Once   pantoprazole  40 mg Oral Q0600   phytonadione  5 mg Oral Daily   Continuous Infusions:  octreotide  (SANDOSTATIN)    IV infusion 50 mcg/hr (08/01/18 1715)   PRN Meds:.acetaminophen, iohexol, lidocaine **AND** lidocaine   ASSESMENT:   *  Coffee-like emesis and black stools.    08/01/18 EGD: banding x 3 of non-bleeding, grade 2 esophageal varix.  Moderate portal gastropathy.    *   Blood loss anemia.   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  Hep B and C negative.  LFTs normal.    No ascites on CT.    *   Thrombocytopenia, non-critical.  Splenomegaly on CT.     *  Coagulopathy.  Mild.  PO vit K for 3 days.    *   Hyponatremia, improved  *   Hypokalemia.    *   Poorly controlled DM.      PLAN    *  48 hours octreotide, finishes today at 1715.  Once daily oral PPI.   CBC in AM.   Will arrange ROV with Ardis Hughs in a few weeks, not sure he will show up but will see.   Total abstinence from ETOH.  Avoid NSAIDs, ASA; though in future months, occasional use ok.    Dulcolax po now ordered.     Azucena Freed  08/02/2018, 11:49 AM Phone [phone]   ________________________________________________________________________  Velora Heckler GI MD note:  I personally examined the patient, reviewed the data and agree with the assessment and plan described above.   Owens Loffler, MD [facility] Pager [phone]'

## 2018-08-03 ENCOUNTER — Encounter (HOSPITAL_COMMUNITY): Payer: Self-pay | Admitting: General Practice

## 2018-08-03 LAB — BASIC METABOLIC PANEL
Anion gap: 8 (ref 5–15)
BUN: <5 mg/dL — ABNORMAL LOW (ref 6–20)
CO2: 23 mmol/L (ref 22–32)
Calcium: 7.4 mg/dL — ABNORMAL LOW (ref 8.9–10.3)
Chloride: 102 mmol/L (ref 98–111)
Creatinine, Ser: 0.56 mg/dL — ABNORMAL LOW (ref 0.61–1.24)
GFR calc Af Amer: >60 mL/min (ref >60)
GFR calc non Af Amer: >60 mL/min (ref >60)
Glucose, Bld: 232 mg/dL — ABNORMAL HIGH (ref 70–99)
Potassium: 3 mmol/L — ABNORMAL LOW (ref 3.5–5.1)
Sodium: 133 mmol/L — ABNORMAL LOW (ref 135–145)

## 2018-08-03 LAB — CBC
HCT: 23.6 % — ABNORMAL LOW (ref 39.0–52.0)
Hemoglobin: 8.4 g/dL — ABNORMAL LOW (ref 13.0–17.0)
MCH: 31.8 pg (ref 26.0–34.0)
MCHC: 35.6 g/dL (ref 30.0–36.0)
MCV: 89.4 fL (ref 80.0–100.0)
Platelets: 122 10*3/uL — ABNORMAL LOW (ref 150–400)
RBC: 2.64 MIL/uL — ABNORMAL LOW (ref 4.22–5.81)
RDW: 16.6 % — ABNORMAL HIGH (ref 11.5–15.5)
WBC: 3.8 10*3/uL — ABNORMAL LOW (ref 4.0–10.5)
nRBC: 0 % (ref 0.0–0.2)

## 2018-08-03 LAB — PROTIME-INR
INR: 1.2 (ref 0.8–1.2)
Prothrombin Time: 15.3 seconds — ABNORMAL HIGH (ref 11.4–15.2)

## 2018-08-03 LAB — GC/CHLAMYDIA PROBE AMP (~~LOC~~) NOT AT ARMC
Chlamydia: NEGATIVE
Neisseria Gonorrhea: NEGATIVE

## 2018-08-03 LAB — GLUCOSE, CAPILLARY
Glucose-Capillary: 266 mg/dL — ABNORMAL HIGH (ref 70–99)
Glucose-Capillary: 277 mg/dL — ABNORMAL HIGH (ref 70–99)

## 2018-08-03 MED ORDER — TRAMADOL HCL 50 MG PO TABS: 50.0000 mg | ORAL_TABLET | Freq: Three times a day (TID) | ORAL | 0 refills | Status: AC | PRN

## 2018-08-03 MED ORDER — INSULIN ASPART PROT & ASPART (70-30 MIX) 100 UNIT/ML ~~LOC~~ SUSP
15.0000 [IU] | Freq: Every day | SUBCUTANEOUS | Status: DC
  Administered 2018-08-03: 08:00:00 15 [IU] via SUBCUTANEOUS

## 2018-08-03 MED ORDER — AMOXICILLIN 500 MG PO CAPS: 500.0000 mg | ORAL_CAPSULE | Freq: Three times a day (TID) | ORAL | 0 refills | Status: AC

## 2018-08-03 MED ORDER — GABAPENTIN 300 MG PO CAPS: 300.0000 mg | ORAL_CAPSULE | Freq: Three times a day (TID) | ORAL | 0 refills | Status: DC

## 2018-08-03 MED ORDER — PANTOPRAZOLE SODIUM 40 MG PO TBEC: 40.0000 mg | DELAYED_RELEASE_TABLET | Freq: Every day | ORAL | 0 refills | Status: DC

## 2018-08-03 MED ORDER — TRAMADOL HCL 50 MG PO TABS
50.0000 mg | ORAL_TABLET | Freq: Three times a day (TID) | ORAL | Status: DC | PRN
  Administered 2018-08-03: 50 mg via ORAL
  Filled 2018-08-03: qty 1

## 2018-08-03 NOTE — Progress Notes (Signed)
[facility] Progress Note    Since last GI note: NO overt rebleeding, eating well.  Objective: Vital signs in last 24 hours: Temp:  [98.6 F (37 C)-98.8 F (37.1 C)] 98.8 F (37.1 C) (07/09 0500) Pulse Rate:  [74-87] 80 (07/08 2300) Resp:  [14-21] 17 (07/08 2300) BP: (106-140)/(69-73) 106/69 (07/09 0500) SpO2:  [97 %-100 %] 98 % (07/08 2300) Last BM Date: 08/02/18 General: alert and oriented times 3 Heart: regular rate and rythm Abdomen: soft, non-tender, non-distended, normal bowel sounds   Lab Results: Recent Labs    08/01/18 2241 08/02/18 0255 08/03/18 0322  WBC 4.5 4.1 3.8*  HGB 8.4* 8.5* 8.4*  PLT 119* 116* 122*  MCV 89.0 88.6 89.4   Recent Labs    08/01/18 0249 08/02/18 0255 08/03/18 0322  NA 134* 134* 133*  K 3.5 3.2* 3.0*  CL 106 105 102  CO2 20* 20* 23  GLUCOSE 312* 243* 232*  BUN 15 6 <5*  CREATININE 0.64 0.69 0.56*  CALCIUM 7.5* 7.2* 7.4*   Recent Labs    07/31/18 1539 08/01/18 0249 08/02/18 0255  PROT 5.8* 4.9* 4.8*  ALBUMIN 2.9* 2.5* 2.3*  AST 23 20 22   ALT 23 21 18   ALKPHOS 84 78 70  BILITOT 1.2 1.1 1.0   Recent Labs    08/01/18 0249 08/03/18 0322  INR 1.3* 1.2     Medications: Scheduled Meds: . amoxicillin  500 mg Oral Q8H  . atorvastatin  40 mg Oral Daily  . gabapentin  300 mg Oral TID  . insulin aspart  0-15 Units Subcutaneous TID WC  . insulin aspart protamine- aspart  15 Units Subcutaneous Q breakfast  . lidocaine   Topical Once  . pantoprazole  40 mg Oral Q0600   Continuous Infusions: PRN Meds:.acetaminophen, iohexol, lidocaine **AND** lidocaine, traMADol    Assessment/Plan: 43 y.o. male with resolved UGI bleeding  Portal gasrtopathy and esophageal varices (banded). He is safe for d/c from my perspective.  He should absolutely never drink again and should stop smoking meth as well.  I am not sure what the "20-50" pills are that he reported to take every day but he should stop taking them all and  only take what is on his d/c med list.  SHould be on PPI once daily.  My office will arrange return OV with me in 3-4 weeks, hopefully he will show.  From there, can discuss further liver care and schedule repeat EGD for variceal banding.  Please call or page with any further questions or concerns.   Milus Banister, MD  08/03/2018, 10:28 AM [facility] Pager [phone]

## 2018-08-03 NOTE — Progress Notes (Signed)
Physical Therapy Treatment Patient Details Name: Mitchell [name] MRN: [id number] DOB: 07/29/1975 Today's Date: 08/03/2018    History of Present Illness 43 yo male with onset of GI bleed w/N&V and tachycardia from dehydration was admitted, noted DM uncontrolled and UTI present with suspected pyelonephritis, SIRS.  Has foot ulcer with eschar, painful to wb.  PHx:  DM, depression, HLD, cellulitis, partial R foot amputation, EtOH cirrhosis, polysubstance abuse,     PT Comments    Patient ambulating without assistance, use of RW. VSS on RA. Intends to go home with support of significant other.  Agree with HHPT recs.    Follow Up Recommendations  Home health PT;Supervision for mobility/OOB     Equipment Recommendations  Rolling walker with 5" wheels    Recommendations for Other Services       Precautions / Restrictions Precautions Precautions: Fall Precaution Comments: monitor vitals with telemetry Restrictions Weight Bearing Restrictions: No Other Position/Activity Restrictions: may need protective dressing and shoe for R foot    Mobility  Bed Mobility               General bed mobility comments: up in chair when PT arrived  Transfers Overall transfer level: Needs assistance Equipment used: Rolling walker (2 wheeled);1 person hand held assist Transfers: Sit to/from Stand Sit to Stand: Min assist         General transfer comment: assisted with powering up and pt was able to steady on walker  Ambulation/Gait Ambulation/Gait assistance: Min guard Gait Distance (Feet): 50 Feet Assistive device: Rolling walker (2 wheeled);1 person hand held assist Gait Pattern/deviations: Step-through pattern;Shuffle;Decreased stride length Gait velocity: reduced   General Gait Details: painful, ambulating on RA VSS use of RW no overt LOB    Stairs             Wheelchair Mobility    Modified Rankin (Stroke Patients Only)       Balance Overall balance assessment:  Needs assistance Sitting-balance support: Feet supported Sitting balance-Leahy Scale: Good     Standing balance support: Bilateral upper extremity supported;During functional activity Standing balance-Leahy Scale: Fair                              Cognition Arousal/Alertness: Awake/alert Behavior During Therapy: WFL for tasks assessed/performed Overall Cognitive Status: Within Functional Limits for tasks assessed                                 General Comments: answers appropriately with translation      Exercises      General Comments        Pertinent Vitals/Pain Pain Assessment: 0-10 Pain Score: 7  Pain Location: chest, neck and R foot Pain Intervention(s): Monitored during session;Limited activity within patient's tolerance    Home Living                      Prior Function            PT Goals (current goals can now be found in the care plan section) Acute Rehab PT Goals Patient Stated Goal: via translator wants to get pain to be relieved PT Goal Formulation: With patient Time For Goal Achievement: 08/16/18 Potential to Achieve Goals: Good Progress towards PT goals: Progressing toward goals    Frequency    Min 4X/week      PT Plan  Co-evaluation              AM-PAC PT "6 Clicks" Mobility   Outcome Measure  Help needed turning from your back to your side while in a flat bed without using bedrails?: None Help needed moving from lying on your back to sitting on the side of a flat bed without using bedrails?: A Little Help needed moving to and from a bed to a chair (including a wheelchair)?: A Little Help needed standing up from a chair using your arms (e.g., wheelchair or bedside chair)?: A Little Help needed to walk in hospital room?: A Little Help needed climbing 3-5 steps with a railing? : A Lot 6 Click Score: 18    End of Session Equipment Utilized During Treatment: Gait belt;Other  (comment)(translation cart for entire session) Activity Tolerance: Patient limited by pain;Treatment limited secondary to medical complications (Comment)(light headed initially then felt better after sitting, water) Patient left: in chair;with call bell/phone within reach;with nursing/sitter in room Nurse Communication: Mobility status PT Visit Diagnosis: Unsteadiness on feet (R26.81);Muscle weakness (generalized) (M62.81);Difficulty in walking, not elsewhere classified (R26.2);Pain Pain - Right/Left: Right Pain - part of body: Ankle and joints of foot(chest)     Time: 1100-1120 PT Time Calculation (min) (ACUTE ONLY): 20 min  Charges:  $Gait Training: 8-22 mins                     Reinaldo Berber, PT, DPT Acute Rehabilitation Services Pager: [phone] Office: [phone]     Reinaldo Berber 08/03/2018, 11:30 AM

## 2018-08-03 NOTE — Progress Notes (Signed)
Pt discharged to home. Family here to transport. Social work and Care management spoke with him - he declined front wheeled walker - said he has a cane at home. Discussed pescriptions at [facility], importance of insulin regimen, follow up with [facility]. Pt asked questions, able to teach back. IVs removed. Pt in stable condition.

## 2018-08-03 NOTE — Progress Notes (Signed)
Family Medicine Teaching Service Daily Progress Note Intern Pager: [phone]  Patient name: Mitchell [name] Medical record number: [id number] Date of birth: June 28, 1975 Age: 43 y.o. Gender: male  Primary Care Provider: Stark Klein, MD Consultants: GI Code Status: full  Pt Overview and Major Events to Date:  7/6- admitted with acute upper GI bleed  Assessment and Plan:  Acute upper GI bleed-resolved. GI following and to see outpatient. hgb stable today at 8.4 patient's abdominal pain is improved today. No more episodes of vomiting. He is eating well. - f/u GI recs  - continue IV octreotide at variceal dose (48hrs post EGD- finishes this evening ~5) - continue PO protonix - discontinue telemetry - discontinued IV maintenance yesterday - topical lidocaine for pain - CBC am  Poorly controlled EGB1DV with complications- CBGs in lower 200s overnight. On sliding scale- received 15u novolog yesterday. Increase basal today to 15 units 70/30. Patient is eating 100% of meals consistently. - mSSI - increas 70/30 (15 units today) - CBGs with meals and QHS - diabetic coordinator working with CM for insulin regimen outpatient, appreciate the help  Asymptomatic GBS UTI- Patient has ~2.4L UOP yesterday. Denies adverse effect of Abx. WBC 3.8, afebrile. Cr stable wnl. - started on CTX in ED (2230 on 7/6) dc  - amoxicillin 500mg  TID (7/8-7/10) - strict I/O - f/u urine GC/chlamydia (collected)  H/o polysubstance abuse/EtOH- reports sobriety x8 years. CT showed stigmata of cirrhosis and splenomegaly. UDS positive for amphetamines, EtOH negtive. Pain has been 0-3 overnight. Decreased pain endorsed and on exam.  - tylenol PRN for pain - gabapentin 300mg  TID - tramadol PRN  Pseudohyponatremia. Na 134 stable today. Improved with glucose control. Asymptomatic  - BMP am  Diabetic foot ulcer-stable. No active lesion. - pain control- tylenol, gabapentin, tramadol  - PT following- recommended home  health PT - consider repeat ABI  Hyperlipidemia-  - continue home lipitor 40mg  when able to take PO  FEN/GI: diabetic diet, octreotide, protonix PPx: SCDs, encourage activity, consider subq heparin for DVT prophylaxis.   Disposition: dc pending GI workup, likely tomorrow  Subjective:  Patient appears better today. He is eating well, no vomiting. Improved pain. Continued to have some abdominal pain sounds like GERD or post procedure discomfort. Also has chronic pain in his foot which is stable.   Objective: Temp:  [98 F (36.7 C)-98.8 F (37.1 C)] 98.8 F (37.1 C) (07/09 0500) Pulse Rate:  [74-87] 80 (07/08 2300) Resp:  [14-21] 17 (07/08 2300) BP: (106-140)/(69-80) 106/69 (07/09 0500) SpO2:  [97 %-100 %] 98 % (07/08 2300) Physical Exam: General: NAD Cardiovascular: RRR, no murmur appreciated Respiratory: no increased WOB, CTAB Abdomen: soft, no masses. Mildly tender to palpation epigastric region Derm: skin is warm to the touch diffusely, afebrile. Extremities: R foot deformity- no active lesion. Exam unchanged from admission image (see h&p clinical image for further detail). SCDs in place  Laboratory: Recent Labs  Lab 08/01/18 2241 08/02/18 0255 08/03/18 0322  WBC 4.5 4.1 3.8*  HGB 8.4* 8.5* 8.4*  HCT 23.5* 24.0* 23.6*  PLT 119* 116* 122*   Recent Labs  Lab 07/31/18 1539  08/01/18 0249 08/02/18 0255 08/03/18 0322  NA 124*   < > 134* 134* 133*  K 4.1   < > 3.5 3.2* 3.0*  CL 91*  --  106 105 102  CO2 22  --  20* 20* 23  BUN 28*  --  15 6 <5*  CREATININE 0.79  --  0.64 0.69 0.56*  CALCIUM  8.3*  --  7.5* 7.2* 7.4*  PROT 5.8*  --  4.9* 4.8*  --   BILITOT 1.2  --  1.1 1.0  --   ALKPHOS 84  --  78 70  --   ALT 23  --  21 18  --   AST 23  --  20 22  --   GLUCOSE 736*  --  312* 243* 232*   < > = values in this interval not displayed.   Hgb A1c 12.1 PT/INR- 15.9/1.3 ptt 29 LA 2.2>0.9  Imaging/Diagnostic Tests: No results found.  Leeroy BockAnderson,  L,  DO 08/03/2018, 7:10 AM PGY-2, [place] [facility] FPTS Intern pager: [phone], text pages welcome

## 2018-08-04 NOTE — TOC Initial Note (Signed)
Transition of Care [facility](TOC) - Initial/Assessment Note    Patient Details  Name: Mitchell [name] MRN: [id number] Date of Birth: 08/20/1975  Transition of Care [facility](TOC) CM/SW Contact:    Mearl LatinNadia S Eloise Picone, LCSW Phone Number: 08/04/2018, 9:22 AM  Clinical Narrative:                 CSW and RNCM spoke with patient regarding home care and equipment needs. Patient declined walker and stated he has a cane at home. He reported his sister will be helping him at the house and he has a ride home. He mentioned having difficulty affording medications. CSW set him a [facility] appointment to see if he can get medications cheaper and provided a GoodRX card. Patient has already utilized a Programmer, systemsMATCH letter and cannot get another at this time.   Expected Discharge Plan: Home/Self Care Barriers to Discharge: No Barriers Identified   Patient Goals and CMS Choice Patient states their goals for this hospitalization and ongoing recovery are:: Return home      Expected Discharge Plan and Services Expected Discharge Plan: Home/Self Care In-house Referral: Clinical Social Work, Designer, jewellerynterpreting Services Discharge Planning Services: CM Consult, MATCH Program, Medication Assistance, Follow-up appt scheduled Post Acute Care Choice: NA Living arrangements for the past 2 months: Apartment Expected Discharge Date: 08/03/18               DME Arranged: Patient refused services         HH Arranged: NA          Prior Living Arrangements/Services Living arrangements for the past 2 months: Apartment Lives with:: Siblings Patient language and need for interpreter reviewed:: Yes(Used Research officer, trade unionpanish Interpreter) Do you feel safe going back to the place where you live?: Yes      Need for Family Participation in Patient Care: No (Comment) Care giver support system in place?: Yes (comment) Current home services: DME Criminal Activity/Legal Involvement Pertinent to Current Situation/Hospitalization: No - Comment as  needed  Activities of Daily Living Home Assistive Devices/Equipment: None ADL Screening (condition at time of admission) Patient's cognitive ability adequate to safely complete daily activities?: Yes Is the patient deaf or have difficulty hearing?: No Does the patient have difficulty seeing, even when wearing glasses/contacts?: No Does the patient have difficulty concentrating, remembering, or making decisions?: No Patient able to express need for assistance with ADLs?: Yes Does the patient have difficulty dressing or bathing?: No Independently performs ADLs?: Yes (appropriate for developmental age) Does the patient have difficulty walking or climbing stairs?: Yes Weakness of Legs: Right Weakness of Arms/Hands: None  Permission Sought/Granted                  Emotional Assessment Appearance:: Appears stated age Attitude/Demeanor/Rapport: Gracious Affect (typically observed): Accepting Orientation: : Oriented to Self, Oriented to Place, Oriented to  Time, Oriented to Situation Alcohol / Substance Use: Illicit Drugs Psych Involvement: No (comment)  Admission diagnosis:  SOB (shortness of breath) [R06.02] Upper GI bleed [K92.2] Patient Active Problem List   Diagnosis Date Noted  . Dehydration 08/01/2018  . Acute blood loss anemia 08/01/2018  . Gastrointestinal hemorrhage 08/01/2018  . Splenomegaly 08/01/2018  . Portal hypertension (HCC) 08/01/2018  . Methamphetamine abuse (HCC) 08/01/2018  . Abnormal CT density of left kidney 08/01/2018  . Pyuria 08/01/2018  . Hematuria, microscopic 08/01/2018  . Hypoalbuminemia 08/01/2018  . Cirrhosis of liver (HCC)   . Esophageal varices without bleeding (HCC)   . Hematemesis 07/31/2018  . H/O: alcohol abuse  12/22/2017  . Diabetic hyperosmolar non-ketotic state ([place])   . Thrombocytopenia ([place]) 01/22/2016  . Diabetes mellitus type 2 with complications ([place]) 59/56/3875  . GERD (gastroesophageal reflux disease)    PCP:  Stark Klein, MD Pharmacy:   [facility] [place], [address] [place] [address] Phone: [phone] Fax: [phone]  [place], [address] [place] [place] [address] Phone: [phone] Fax: [phone]     Social Determinants of Health (SDOH) Interventions Alcohol Brief Interventions/Follow-up: Alcohol Education  Readmission Risk Interventions No flowsheet data found.

## 2018-08-19 ENCOUNTER — Encounter (HOSPITAL_COMMUNITY): Payer: Self-pay

## 2018-08-19 ENCOUNTER — Emergency Department (HOSPITAL_COMMUNITY)
Admission: EM | Admit: 2018-08-19 | Discharge: 2018-08-19 | Disposition: A | Discharge status: Home / Self Care | Payer: Self-pay | Attending: Emergency Medicine | Admitting: Emergency Medicine

## 2018-08-19 ENCOUNTER — Other Ambulatory Visit: Payer: Self-pay

## 2018-08-19 DIAGNOSIS — T383X6A Underdosing of insulin and oral hypoglycemic [antidiabetic] drugs, initial encounter: Secondary | ICD-10-CM | POA: Insufficient documentation

## 2018-08-19 DIAGNOSIS — Z79899 Other long term (current) drug therapy: Secondary | ICD-10-CM | POA: Insufficient documentation

## 2018-08-19 DIAGNOSIS — N390 Urinary tract infection, site not specified: Secondary | ICD-10-CM | POA: Insufficient documentation

## 2018-08-19 DIAGNOSIS — R1084 Generalized abdominal pain: Secondary | ICD-10-CM | POA: Insufficient documentation

## 2018-08-19 DIAGNOSIS — E119 Type 2 diabetes mellitus without complications: Secondary | ICD-10-CM | POA: Insufficient documentation

## 2018-08-19 DIAGNOSIS — R110 Nausea: Secondary | ICD-10-CM | POA: Insufficient documentation

## 2018-08-19 DIAGNOSIS — Z794 Long term (current) use of insulin: Secondary | ICD-10-CM | POA: Insufficient documentation

## 2018-08-19 DIAGNOSIS — Z91120 Patient's intentional underdosing of medication regimen due to financial hardship: Secondary | ICD-10-CM | POA: Insufficient documentation

## 2018-08-19 LAB — CBC
HCT: 35.5 % — ABNORMAL LOW (ref 39.0–52.0)
Hemoglobin: 11.9 g/dL — ABNORMAL LOW (ref 13.0–17.0)
MCH: 28.8 pg (ref 26.0–34.0)
MCHC: 33.5 g/dL (ref 30.0–36.0)
MCV: 86 fL (ref 80.0–100.0)
Platelets: 142 10*3/uL — ABNORMAL LOW (ref 150–400)
RBC: 4.13 MIL/uL — ABNORMAL LOW (ref 4.22–5.81)
RDW: 13.7 % (ref 11.5–15.5)
WBC: 8.4 10*3/uL (ref 4.0–10.5)
nRBC: 0 % (ref 0.0–0.2)

## 2018-08-19 LAB — URINALYSIS, ROUTINE W REFLEX MICROSCOPIC
Bilirubin Urine: NEGATIVE
Glucose, UA: >=500 mg/dL — AB
Ketones, ur: 5 mg/dL — AB
Nitrite: POSITIVE — AB
Protein, ur: 30 mg/dL — AB
Specific Gravity, Urine: 1.026 (ref 1.005–1.030)
WBC, UA: >50 WBC/hpf — ABNORMAL HIGH (ref 0–5)
pH: 7 (ref 5.0–8.0)

## 2018-08-19 LAB — CBG MONITORING, ED
Glucose-Capillary: 548 mg/dL (ref 70–99)
Glucose-Capillary: 333 mg/dL — ABNORMAL HIGH (ref 70–99)
Glucose-Capillary: 276 mg/dL — ABNORMAL HIGH (ref 70–99)
Glucose-Capillary: 230 mg/dL — ABNORMAL HIGH (ref 70–99)

## 2018-08-19 LAB — BASIC METABOLIC PANEL
Anion gap: 14 (ref 5–15)
BUN: 7 mg/dL (ref 6–20)
CO2: 21 mmol/L — ABNORMAL LOW (ref 22–32)
Calcium: 8.7 mg/dL — ABNORMAL LOW (ref 8.9–10.3)
Chloride: 94 mmol/L — ABNORMAL LOW (ref 98–111)
Creatinine, Ser: 0.97 mg/dL (ref 0.61–1.24)
GFR calc Af Amer: >60 mL/min (ref >60)
GFR calc non Af Amer: >60 mL/min (ref >60)
Glucose, Bld: 565 mg/dL (ref 70–99)
Potassium: 3.7 mmol/L (ref 3.5–5.1)
Sodium: 129 mmol/L — ABNORMAL LOW (ref 135–145)

## 2018-08-19 MED ORDER — METFORMIN HCL 1000 MG PO TABS: 1000.0000 mg | ORAL_TABLET | Freq: Every day | ORAL | 0 refills | Status: AC

## 2018-08-19 MED ORDER — FAMOTIDINE IN NACL 20-0.9 MG/50ML-% IV SOLN
20.0000 mg | Freq: Once | INTRAVENOUS | Status: AC
  Administered 2018-08-19: 19:00:00 20 mg via INTRAVENOUS
  Filled 2018-08-19: qty 50

## 2018-08-19 MED ORDER — FENTANYL CITRATE (PF) 100 MCG/2ML IJ SOLN
25.0000 ug | Freq: Once | INTRAMUSCULAR | Status: AC
  Administered 2018-08-19: 20:00:00 25 ug via INTRAVENOUS
  Filled 2018-08-19: qty 2

## 2018-08-19 MED ORDER — SODIUM CHLORIDE 0.9 % IV BOLUS
1000.0000 mL | Freq: Once | INTRAVENOUS | Status: AC
  Administered 2018-08-19: 18:00:00 1000 mL via INTRAVENOUS

## 2018-08-19 MED ORDER — INSULIN ASPART PROT & ASPART (70-30 MIX) 100 UNIT/ML ~~LOC~~ SUSP: 20.0000 [IU] | Freq: Every day | SUBCUTANEOUS | 11 refills | Status: AC

## 2018-08-19 MED ORDER — SODIUM CHLORIDE 0.9 % IV BOLUS
500.0000 mL | Freq: Once | INTRAVENOUS | Status: AC
  Administered 2018-08-19: 20:00:00 500 mL via INTRAVENOUS

## 2018-08-19 MED ORDER — CEPHALEXIN 500 MG PO CAPS: 500.0000 mg | ORAL_CAPSULE | Freq: Four times a day (QID) | ORAL | 0 refills | Status: AC

## 2018-08-19 MED ORDER — INSULIN ASPART 100 UNIT/ML ~~LOC~~ SOLN
5.0000 [IU] | Freq: Once | SUBCUTANEOUS | Status: AC
  Administered 2018-08-19: 5 [IU] via SUBCUTANEOUS

## 2018-08-19 NOTE — ED Notes (Signed)
Pt given pepcid for some epigastric pain  Iv now

## 2018-08-19 NOTE — Discharge Instructions (Signed)
Use your insulin as previously prescribed.  I have provided you with a refill of your medications. However you will need to follow up with your primary care provider for further refills of your medications. You will need to take the antibiotics to help with your urinary tract infection. Return to the ED if you start to have worsening symptoms, develop a fever, chest pain, shortness of breath, leg swelling.   Use su insulina segn lo prescrito previamente. Le he proporcionado un reabastecimiento de sus medicamentos. Sin embargo, Development worker, international aid un seguimiento con su proveedor de atencin primaria para obtener ms resurtidos de sus medicamentos. Deber tomar antibiticos para ayudar con la infeccin del tracto urinario. Regrese al servicio de urgencias si comienza a tener sntomas que [place], desarrolla fiebre, dolor en el pecho, falta de [place], hinchazn de las piernas.
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
## 2018-08-19 NOTE — ED Notes (Signed)
Pt wants pain med  2nd liter of fluid  Opened   Insulin given sq

## 2018-08-19 NOTE — ED Triage Notes (Signed)
Pt arrives POV for eval of blt feet numbness onset 2 years which he reports is intermittent. States it was worse this morning than usual.

## 2018-08-19 NOTE — ED Notes (Signed)
Pain med given 

## 2018-08-24 ENCOUNTER — Inpatient Hospital Stay: Payer: Self-pay | Admitting: Family Medicine

## 2018-08-25 ENCOUNTER — Telehealth: Payer: Self-pay | Admitting: Gastroenterology

## 2018-08-25 NOTE — Telephone Encounter (Signed)
Patient  Sister called said that he feels very bad and weak and would like to know what he can do in the meantime until his appt. He says that he feels terrible when he takes the med  nitroGLYCERIN (NITROSTAT) 0.4 MG SL tablet when he takes it. Would like to speak to someone

## 2018-08-25 NOTE — Telephone Encounter (Signed)
Left message on machine to call back  

## 2018-08-28 NOTE — Telephone Encounter (Signed)
Left message on machine to call back unable to reach pt.  He has an appt with Dr Ardis Hughs on 8/4.  Will wait further communication from pt/ f/u on 8/4.

## 2018-08-29 ENCOUNTER — Telehealth: Payer: Self-pay | Admitting: Gastroenterology

## 2018-08-29 ENCOUNTER — Encounter: Payer: Self-pay | Admitting: Gastroenterology

## 2018-08-29 ENCOUNTER — Ambulatory Visit (INDEPENDENT_AMBULATORY_CARE_PROVIDER_SITE_OTHER): Payer: Self-pay | Admitting: Gastroenterology

## 2018-08-29 ENCOUNTER — Other Ambulatory Visit (INDEPENDENT_AMBULATORY_CARE_PROVIDER_SITE_OTHER): Payer: Self-pay

## 2018-08-29 ENCOUNTER — Ambulatory Visit: Payer: Self-pay | Admitting: Gastroenterology

## 2018-08-29 VITALS — BP 114/66 | HR 84 | Temp 98.1°F | Ht 62.6 in | Wt 138.2 lb

## 2018-08-29 DIAGNOSIS — I8500 Esophageal varices without bleeding: Secondary | ICD-10-CM

## 2018-08-29 DIAGNOSIS — I8510 Secondary esophageal varices without bleeding: Secondary | ICD-10-CM

## 2018-08-29 LAB — COMPREHENSIVE METABOLIC PANEL
ALT: 21 U/L (ref 0–53)
AST: 18 U/L (ref 0–37)
Albumin: 4 g/dL (ref 3.5–5.2)
Alkaline Phosphatase: 149 U/L — ABNORMAL HIGH (ref 39–117)
BUN: 10 mg/dL (ref 6–23)
CO2: 26 mEq/L (ref 19–32)
Calcium: 9.5 mg/dL (ref 8.4–10.5)
Chloride: 91 mEq/L — ABNORMAL LOW (ref 96–112)
Creatinine, Ser: 0.73 mg/dL (ref 0.40–1.50)
GFR: 116.54 mL/min (ref >60.00)
Glucose, Bld: 672 mg/dL (ref 70–99)
Potassium: 4.3 mEq/L (ref 3.5–5.1)
Sodium: 127 mEq/L — ABNORMAL LOW (ref 135–145)
Total Bilirubin: 0.5 mg/dL (ref 0.2–1.2)
Total Protein: 7.9 g/dL (ref 6.0–8.3)

## 2018-08-29 LAB — CBC WITH DIFFERENTIAL/PLATELET
Basophils Absolute: 0 10*3/uL (ref 0.0–0.1)
Basophils Relative: 1.1 % (ref 0.0–3.0)
Eosinophils Absolute: 0 10*3/uL (ref 0.0–0.7)
Eosinophils Relative: 0.8 % (ref 0.0–5.0)
HCT: 33.3 % — ABNORMAL LOW (ref 39.0–52.0)
Hemoglobin: 10.8 g/dL — ABNORMAL LOW (ref 13.0–17.0)
Lymphocytes Relative: 18.8 % (ref 12.0–46.0)
Lymphs Abs: 0.7 10*3/uL (ref 0.7–4.0)
MCHC: 32.6 g/dL (ref 30.0–36.0)
MCV: 86.9 fl (ref 78.0–100.0)
Monocytes Absolute: 0.3 10*3/uL (ref 0.1–1.0)
Monocytes Relative: 7.4 % (ref 3.0–12.0)
Neutro Abs: 2.7 10*3/uL (ref 1.4–7.7)
Neutrophils Relative %: 71.9 % (ref 43.0–77.0)
Platelets: 130 10*3/uL — ABNORMAL LOW (ref 150.0–400.0)
RBC: 3.83 Mil/uL — ABNORMAL LOW (ref 4.22–5.81)
RDW: 16.2 % — ABNORMAL HIGH (ref 11.5–15.5)
WBC: 3.7 10*3/uL — ABNORMAL LOW (ref 4.0–10.5)

## 2018-08-29 LAB — PROTIME-INR
INR: 1.1 ratio — ABNORMAL HIGH (ref 0.8–1.0)
Prothrombin Time: 12.7 s (ref 9.6–13.1)

## 2018-08-29 MED ORDER — NADOLOL 20 MG PO TABS: 20.0000 mg | ORAL_TABLET | Freq: Every day | ORAL | 3 refills | Status: DC

## 2018-08-29 NOTE — Telephone Encounter (Signed)
Spoke to Mr Mitchell [name] he will be here today at 2:30pm.
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
## 2018-08-29 NOTE — Patient Instructions (Addendum)
Labs today: CBC, complete metabolic profile, coags, alpha-fetoprotein, hepatitis B surface antibody.   [facility] repeat variceal banding.   New start nadolol (20mg  pill, one pill once daily, dispense 1 month with 3 refills).  RN visit here in our office for repeat heart rate and blood pressure check in 2 weeks   Repeat office visit with Dr. Ardis Hughs in 2 months  Thank you for entrusting me with your care and choosing [facility].  Dr Ardis Hughs

## 2018-08-29 NOTE — Telephone Encounter (Signed)
Barbie Haggis Dr Ardis Hughs has an opening at 2:30 pm can you add him in that spot?

## 2018-08-30 ENCOUNTER — Ambulatory Visit (INDEPENDENT_AMBULATORY_CARE_PROVIDER_SITE_OTHER): Payer: Self-pay | Admitting: Primary Care

## 2018-08-30 ENCOUNTER — Telehealth: Payer: Self-pay | Admitting: Gastroenterology

## 2018-08-30 ENCOUNTER — Other Ambulatory Visit: Payer: Self-pay

## 2018-08-30 ENCOUNTER — Encounter (INDEPENDENT_AMBULATORY_CARE_PROVIDER_SITE_OTHER): Payer: Self-pay | Admitting: Primary Care

## 2018-08-30 VITALS — BP 114/73 | HR 84 | Temp 97.7°F | Ht 63.39 in | Wt 139.0 lb

## 2018-08-30 DIAGNOSIS — E118 Type 2 diabetes mellitus with unspecified complications: Secondary | ICD-10-CM

## 2018-08-30 DIAGNOSIS — R739 Hyperglycemia, unspecified: Secondary | ICD-10-CM

## 2018-08-30 DIAGNOSIS — Z794 Long term (current) use of insulin: Secondary | ICD-10-CM

## 2018-08-30 LAB — GLUCOSE, POCT (MANUAL RESULT ENTRY)
POC Glucose: >500 mg/dl (ref 70–99)
POC Glucose: 351 mg/dl — AB (ref 70–99)
POC Glucose: 430 mg/dl — AB (ref 70–99)
POC Glucose: 556 mg/dl — AB (ref 70–99)

## 2018-08-30 LAB — POCT URINALYSIS DIPSTICK
Bilirubin, UA: NEGATIVE
Glucose, UA: POSITIVE — AB
Ketones, UA: NEGATIVE
Leukocytes, UA: NEGATIVE
Nitrite, UA: NEGATIVE
Protein, UA: NEGATIVE
Spec Grav, UA: 1.01 (ref 1.010–1.025)
Urobilinogen, UA: 0.2 E.U./dL
pH, UA: 6 (ref 5.0–8.0)

## 2018-08-30 LAB — AFP TUMOR MARKER: AFP-Tumor Marker: 3.5 ng/mL (ref <6.1)

## 2018-08-30 LAB — HEPATITIS B SURFACE ANTIBODY,QUALITATIVE: Hep B S Ab: NONREACTIVE

## 2018-08-30 MED ORDER — TRAMADOL HCL 50 MG PO TABS: 50.0000 mg | ORAL_TABLET | Freq: Three times a day (TID) | ORAL | 0 refills | Status: AC | PRN

## 2018-08-30 MED ORDER — INSULIN ASPART 100 UNIT/ML ~~LOC~~ SOLN
24.0000 [IU] | Freq: Once | SUBCUTANEOUS | Status: AC
  Administered 2018-08-30: 15:00:00 24 [IU] via SUBCUTANEOUS

## 2018-08-30 NOTE — Telephone Encounter (Signed)
Spoke to Mr Mitchell [name] and advised him to go to Emergency Room. He tells me he has an office visit with his primary care and does not want to no show or cancel last minute. He will see her first and then head to Emergency room.

## 2018-08-30 NOTE — Telephone Encounter (Signed)
I will continue to call patient until I reach him today.

## 2018-08-30 NOTE — Patient Instructions (Signed)
O diabetes mellitus e a nutrio, adultos Diabetes Mellitus and Nutrition, Adult Quando voc sofre de diabetes (diabetes mellitus),  muito importante ter hbitos de alimentao saudveis, uma vez que os seus nveis de acar no sangue (glicose) so significativamente afetados pelo que voc come e bebe. Comer alimentos saudveis nas quantidades apropriadas e aproximadamente nos mesmos horrios todos os dias pode ajudar voc a:  Controlar seu nvel de glicose sangunea.  Reduzir seu risco de doena cardaca.  Melhorar sua presso arterial.  Alcanar ou manter um peso saudvel. Todas as pessoas com diabetes so diferentes, e cada pessoa tem diferentes necessidades em termos de um plano de refeies. Seu mdico poder recomendar que voc colabore com um especialista em nutrio e dieta (nutricionista) para elaborar o melhor plano de refeies para voc. Seu plano de refeies poder variar dependendo de fatores como:  As calorias de que voc necessita.  Os medicamentos que toma.  Seu peso.  Seus nveis de glicose sangunea, presso arterial e colesterol.  Seu nvel de atividade.  Outros quadros clnicos, como doena cardaca ou renal. Como os carboidratos me afetam? Os carboidratos, tambm chamado de acares, afetam o nvel de sua glicose sangunea mais do que qualquer outro tipo de alimento. A ingesto de carboidratos aumenta naturalmente a quantidade de glicose no seu sangue. A contagem de carboidratos  um mtodo para acompanhar a quantidade de carboidratos que voc ingere. A contagem de carboidratos  importante para manter sua glicose sangunea em um nvel saudvel, principalmente se voc usa insulina ou toma certos medicamentos orais para o diabetes.  importante saber quanto de carboidrato voc pode ingerir com segurana em cada refeio. Isso varia de pessoa para pessoa. Seu nutricionista poder ajud-lo a calcular quanto de carboidrato voc deve consumir em cada refeio e em cada  lanche. Alimentos que contm carboidratos incluem:  Po, cereais, arroz, massas e bolachas.  Batatas e milho.  Ervilhas, feijo e lentilhas.  Leite e iogurte.  Frutas e sucos.  Sobremesas, como bolos, biscoitos, sorvete e doces. Como o lcool me afeta? O lcool pode causar uma sbita reduo do nvel de glicose sangunea (hipoglicemia), especialmente se voc usar insulina ou tomar certos medicamentos orais para o diabetes. A hipoglicemia pode ser um quadro clnico potencialmente fatal. Os sintomas de hipoglicemia (sonolncia, vertigem e confuso) so parecidos com os sintomas da ingesto abusiva de lcool. Caso seu mdico diga que o lcool  seguro para voc, siga essas orientaes:  Limite o consumo de bebidas alcolicas a, no mximo, 1 dose por dia para mulheres no grvidas e 2 doses por dia para homens. Uma dose equivale a 12 onas de cerveja, 5 onas de vinho ou 1 ona de bebida destilada.  No beba de estmago vazio.  Mantenha sua hidratao com gua, refrigerante diet ou ch gelado sem acar.  Tenha em mente que o refrigerante normal, suco e outras bebidas podem conter muito acar e devem ser contadas como carboidratos. Quais so as dicas para seguir este plano?  Leia os rtulos dos alimentos  Comece verificando o tamanho da poro nas "Informaes nutricionais" no rtulo de alimentos e bebidas embalados. A quantidade de calorias, carboidratos, gorduras e outros nutrientes listados no rtulo se baseia em uma poro padro do alimento. Muitos itens contm mais de uma poro por embalagem.  Verifique o total de gramas (g) de carboidratos contidos em uma poro. Voc pode calcular o nmero de pores de carboidratos em uma poro dividindo o total de carboidratos por 15. Por exemplo: supondo que um alimento contenha um   total de 30 g de carboidratos, isso seria igual a 2 pores de carboidratos.  Verifique o nmero de gramas (g) de gorduras saturadas e trans em uma poro.  Escolha alimentos com pouca ou nenhuma quantidade dessas gorduras.  Verifique a quantidade de miligramas (mg) de sal (sdio) em uma poro. A maioria das pessoas deve limitar o consumo de sdio a 2.300 mg por dia.  Sempre verifique as informaes nutricionais dos alimentos rotulados como de "baixo teor de gordura" e "gordura zero". Esses alimentos podem conter elevado teor de acar de adio ou carboidratos refinados e devem ser evitados.  Converse com seu nutricionista para identificar suas metas dirias dos nutrientes listados na tabela. No mercado  Evite comprar alimentos enlatados, semiprontos ou processados. Esses alimentos tendem a conter elevado teor de gordura, sdio e acares adicionados.  Escolha itens das reas mais externas da seo de alimentos. Ela inclui frutas e verduras frescas, cereais a granel, carnes frescas e produtos avirios frescos. Na cozinha  Use mtodos de cozimento de baixo calor, como assar, em vez de mtodos de cozimento de alto calor, como fritura por imerso.  Cozinhe com leos saudveis para o corao, como de oliva, canola ou girassol.  Evite cozinhar com manteiga, creme ou carnes com elevado teor de gordura. O planejamento das refeies  Consuma refeies e lanches regularmente, de preferncia nos mesmos horrios todos os dias. Evite ficar longos perodos sem comer.  Coma alimentos ricos em fibra, como frutas e verduras frescas, feijo e gros integrais. Converse com seu nutricionista sobre quantas pores de carboidratos voc pode comer em cada refeio.  Coma de 4-6 onas de protena magra por dia, como carne magra, frango, peixe, ovos ou tofu. Uma ona de protena magra  igual a: ? 1 ona de carne, frango ou peixe. ? 1 ovo. ?  xcara de tofu.  Coma alguns alimentos todos os dias que contenham gorduras saudveis, como abacate, nozes, sementes e peixe. Estilo de vida  Verifique sua glicose sangunea regularmente.  Exercite-se regularmente de  acordo com as indicaes do seu mdico. Isso pode incluir: ? 150 minutos de exerccios de intensidade moderada ou de intensidade vigorosa por semana. Pode ser uma caminhada leve, andar de bicicleta ou fazer hidroginstica. ? Alongar e realizar exerccios de fora, como ioga ou musculao, pelo menos 2 vezes por semana.  Tome medicamentos somente de acordo com as orientaes do seu mdico.  No use nenhum produto que contenha nicotina ou tabaco, como cigarros tradicionais e cigarros eletrnicos. Caso precise de ajuda para parar de fumar, fale com seu mdico.  Consulte-se com um especialista em diabetes para identificar estratgias para lidar com o estresse e quaisquer desafios emocionais ou sociais. Perguntas a fazer ao mdico  Preciso me consultar com um especialista em diabetes?  Preciso me consultar com um nutricionista?  Para que nmero devo ligar se tiver dvidas?  Quais so os melhores horrios para verificar minha glicose sangunea? Onde conseguir mais informaes:  Associao de Diabetes Americana (American Diabetes Association, ADA): diabetes.org  Academia de Nutrio e Diettica (Academy of Nutrition and Dietetics): www.eatright.org  National Institute of Diabetes and Digestive and Kidney Diseases (Instituto Nacional de Diabetes e Doenas Digestivas e Renais): www.niddk.nih.gov Resumo  Um plano de refeies saudveis ajudar voc a controlar sua glicose sangunea e a manter um estilo de vida saudvel.  Consultar um especialista em nutrio (nutricionista) poder ajudar na elaborao do melhor plano para voc.  Tenha em mente que carboidratos (acares) e lcool tm efeitos imediatos sobre seus nveis de glicose   sangunea.  importante contar os carboidratos e consumir lcool com cautela. Estas informaes no se destinam a substituir as recomendaes de seu mdico. No deixe de discutir quaisquer dvidas com seu mdico. Document Released: 05/05/2015 Document Revised:  09/21/2016 Document Reviewed: 05/13/2016 Elsevier Patient Education  2020 Elsevier Inc.  
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
## 2018-08-31 LAB — MICROALBUMIN / CREATININE URINE RATIO
Creatinine, Urine: 14.4 mg/dL
Microalb/Creat Ratio: 46 mg/g creat — ABNORMAL HIGH (ref 0–29)
Microalbumin, Urine: 6.6 ug/mL

## 2018-09-01 ENCOUNTER — Telehealth (INDEPENDENT_AMBULATORY_CARE_PROVIDER_SITE_OTHER): Payer: Self-pay

## 2018-09-01 NOTE — Telephone Encounter (Signed)
Call placed using pacific interpreter [phone]) after verifying date of birth patient was informed that his results were normal. Nat Christen, CMA

## 2018-09-01 NOTE — Telephone Encounter (Signed)
-----   Message from Kerin Perna, NP sent at 09/01/2018 10:41 AM EDT ----- I have reviewed all labs and they are normal/unremarkable. Please notify patient of the results. Have them schedule appointment if there are any other concerns or questions. Thank you.

## 2018-09-08 ENCOUNTER — Telehealth (INDEPENDENT_AMBULATORY_CARE_PROVIDER_SITE_OTHER): Payer: Self-pay

## 2018-09-08 NOTE — Telephone Encounter (Signed)
Patient sister called to request a letter stating that her brother is in good condition to travel to [place]. Patient sister states that her brother has cirrhosis and is causing him to have some trouble with his throat. Patient sister states that patient wants to go back to [place] to continue treatment as well to check up on his sick mother.   Please advice [phone]  Thank you Whitney Post

## 2018-09-08 NOTE — Telephone Encounter (Signed)
FWD to PCP. Mitchell [name], CMA  

## 2018-09-12 ENCOUNTER — Encounter: Payer: Self-pay | Admitting: Gastroenterology

## 2018-09-12 ENCOUNTER — Other Ambulatory Visit: Payer: Self-pay

## 2018-09-12 NOTE — Telephone Encounter (Signed)
Unable to determine if patient at risk for flying- During this pandemic social distancing is a most , and face mask.

## 2018-09-14 ENCOUNTER — Telehealth: Payer: Self-pay | Admitting: Gastroenterology

## 2018-09-14 MED ORDER — NADOLOL 40 MG PO TABS: 40.0000 mg | ORAL_TABLET | Freq: Every day | ORAL | 11 refills | Status: AC

## 2018-09-14 NOTE — Telephone Encounter (Signed)
OK, thanks  Can you tell him to increase his nadolol to 40mg  once daily (from 20mg  once daily).  He will probably need a new prescription sent in for that as well (30 days with 11 refills, thanks). I will see how he responds at next week's EGD ([facility]).  thanks

## 2018-09-14 NOTE — Telephone Encounter (Signed)
-----   Message from Angie Fava, LPN sent at 2/80/0349  7:42 AM EDT ----- B/P 118/70  Pulse 85 the information is under additional documentation in yesterdays visit.  ----- Message ----- From: Milus Banister, MD Sent: 09/13/2018   5:59 AM EDT To: Angie Fava, LPN  I think he showed up yesterday morning for BP, HR check but I cannot find any notes about it.  Do you know his HR and BP from yesterday?

## 2018-09-14 NOTE — Telephone Encounter (Signed)
I spoke to the patient thru the interpreting phone service Barnett Applebaum) told him the message and confirmed the pharmacy and I have sent the nadolol 40mg  in as instructed.

## 2018-09-15 NOTE — Telephone Encounter (Signed)
Does patient need an appointment to be assessed for clearance to travel. Mitchell [name], CMA

## 2018-09-16 NOTE — Telephone Encounter (Signed)
Patient has surgery scheduled on 8/27/20020 will re-eval and confer with GI

## 2018-09-18 ENCOUNTER — Other Ambulatory Visit (HOSPITAL_COMMUNITY)
Admission: RE | Admit: 2018-09-18 | Discharge: 2018-09-18 | Disposition: A | Discharge status: Home / Self Care | Payer: HRSA Program | Source: Ambulatory Visit | Attending: Gastroenterology | Admitting: Gastroenterology

## 2018-09-18 DIAGNOSIS — I8500 Esophageal varices without bleeding: Secondary | ICD-10-CM | POA: Insufficient documentation

## 2018-09-18 DIAGNOSIS — Z01812 Encounter for preprocedural laboratory examination: Secondary | ICD-10-CM | POA: Insufficient documentation

## 2018-09-18 DIAGNOSIS — Z20828 Contact with and (suspected) exposure to other viral communicable diseases: Secondary | ICD-10-CM | POA: Insufficient documentation

## 2018-09-18 LAB — SARS CORONAVIRUS 2 (TAT 6-24 HRS): SARS Coronavirus 2: NEGATIVE

## 2018-09-20 ENCOUNTER — Other Ambulatory Visit: Payer: Self-pay

## 2018-09-20 ENCOUNTER — Encounter (HOSPITAL_COMMUNITY): Payer: Self-pay | Admitting: *Deleted

## 2018-09-20 NOTE — Telephone Encounter (Signed)
Would be please notify patient. Nat Christen, CMA

## 2018-09-20 NOTE — Progress Notes (Signed)
REQUESTED SPANISH INTERPRETER FOR 09-21-18 ENDOSCOPY, ARRIVE 745 AM 09-21-18 [facility] ADMITTING

## 2018-09-21 ENCOUNTER — Ambulatory Visit (HOSPITAL_COMMUNITY)
Admission: RE | Admit: 2018-09-21 | Discharge: 2018-09-21 | Disposition: A | Discharge status: Home / Self Care | Payer: Self-pay | Attending: Gastroenterology | Admitting: Gastroenterology

## 2018-09-21 ENCOUNTER — Encounter (HOSPITAL_COMMUNITY): Payer: Self-pay | Admitting: Gastroenterology

## 2018-09-21 ENCOUNTER — Encounter (HOSPITAL_COMMUNITY)
Admission: RE | Disposition: A | Discharge status: Home / Self Care | Payer: Self-pay | Source: Home / Self Care | Attending: Gastroenterology

## 2018-09-21 ENCOUNTER — Ambulatory Visit (HOSPITAL_COMMUNITY): Payer: Self-pay | Admitting: Anesthesiology

## 2018-09-21 DIAGNOSIS — I8500 Esophageal varices without bleeding: Secondary | ICD-10-CM

## 2018-09-21 DIAGNOSIS — K219 Gastro-esophageal reflux disease without esophagitis: Secondary | ICD-10-CM | POA: Insufficient documentation

## 2018-09-21 DIAGNOSIS — K766 Portal hypertension: Secondary | ICD-10-CM | POA: Insufficient documentation

## 2018-09-21 DIAGNOSIS — Z89431 Acquired absence of right foot: Secondary | ICD-10-CM | POA: Insufficient documentation

## 2018-09-21 DIAGNOSIS — Z794 Long term (current) use of insulin: Secondary | ICD-10-CM | POA: Insufficient documentation

## 2018-09-21 DIAGNOSIS — F1510 Other stimulant abuse, uncomplicated: Secondary | ICD-10-CM | POA: Insufficient documentation

## 2018-09-21 DIAGNOSIS — I8510 Secondary esophageal varices without bleeding: Secondary | ICD-10-CM | POA: Insufficient documentation

## 2018-09-21 DIAGNOSIS — K7460 Unspecified cirrhosis of liver: Secondary | ICD-10-CM | POA: Insufficient documentation

## 2018-09-21 DIAGNOSIS — K3189 Other diseases of stomach and duodenum: Secondary | ICD-10-CM | POA: Insufficient documentation

## 2018-09-21 DIAGNOSIS — F1410 Cocaine abuse, uncomplicated: Secondary | ICD-10-CM | POA: Insufficient documentation

## 2018-09-21 DIAGNOSIS — E785 Hyperlipidemia, unspecified: Secondary | ICD-10-CM | POA: Insufficient documentation

## 2018-09-21 DIAGNOSIS — Z79899 Other long term (current) drug therapy: Secondary | ICD-10-CM | POA: Insufficient documentation

## 2018-09-21 DIAGNOSIS — F1021 Alcohol dependence, in remission: Secondary | ICD-10-CM | POA: Insufficient documentation

## 2018-09-21 HISTORY — PX: ESOPHAGEAL BANDING: SHX5518

## 2018-09-21 HISTORY — PX: ESOPHAGOGASTRODUODENOSCOPY (EGD) WITH PROPOFOL: SHX5813

## 2018-09-21 LAB — GLUCOSE, CAPILLARY
Glucose-Capillary: 335 mg/dL — ABNORMAL HIGH (ref 70–99)
Glucose-Capillary: 283 mg/dL — ABNORMAL HIGH (ref 70–99)
Glucose-Capillary: 333 mg/dL — ABNORMAL HIGH (ref 70–99)
Glucose-Capillary: 259 mg/dL — ABNORMAL HIGH (ref 70–99)

## 2018-09-21 SURGERY — ESOPHAGOGASTRODUODENOSCOPY (EGD) WITH PROPOFOL
Anesthesia: Monitor Anesthesia Care

## 2018-09-21 MED ORDER — SODIUM CHLORIDE 0.9 % IV SOLN: INTRAVENOUS | Status: DC

## 2018-09-21 MED ORDER — PROPOFOL 10 MG/ML IV BOLUS
INTRAVENOUS | Status: AC
  Filled 2018-09-21: qty 40

## 2018-09-21 MED ORDER — PHENYLEPHRINE 40 MCG/ML (10ML) SYRINGE FOR IV PUSH (FOR BLOOD PRESSURE SUPPORT)
PREFILLED_SYRINGE | INTRAVENOUS | Status: DC | PRN
  Administered 2018-09-21: 80 ug via INTRAVENOUS

## 2018-09-21 MED ORDER — PROPOFOL 10 MG/ML IV BOLUS
INTRAVENOUS | Status: AC
  Filled 2018-09-21: qty 20

## 2018-09-21 MED ORDER — PROPOFOL 500 MG/50ML IV EMUL
INTRAVENOUS | Status: DC | PRN
  Administered 2018-09-21: 100 ug/kg/min via INTRAVENOUS

## 2018-09-21 MED ORDER — LACTATED RINGERS IV SOLN
INTRAVENOUS | Status: DC
  Administered 2018-09-21: 09:00:00 1000 mL via INTRAVENOUS

## 2018-09-21 MED ORDER — INSULIN ASPART 100 UNIT/ML ~~LOC~~ SOLN
SUBCUTANEOUS | Status: AC
  Filled 2018-09-21: qty 1

## 2018-09-21 MED ORDER — INSULIN ASPART 100 UNIT/ML ~~LOC~~ SOLN
7.0000 [IU] | Freq: Once | SUBCUTANEOUS | Status: AC
  Administered 2018-09-21: 7 [IU] via SUBCUTANEOUS

## 2018-09-21 MED ORDER — PROPOFOL 10 MG/ML IV BOLUS
INTRAVENOUS | Status: DC | PRN
  Administered 2018-09-21 (×2): 40 mg via INTRAVENOUS
  Administered 2018-09-21: 20 mg via INTRAVENOUS

## 2018-09-21 SURGICAL SUPPLY — 15 items

## 2018-09-21 NOTE — Anesthesia Postprocedure Evaluation (Signed)
Anesthesia Post Note  Patient: Mitchell [name]  Procedure(s) Performed: ESOPHAGOGASTRODUODENOSCOPY (EGD) WITH PROPOFOL (N/A ) ESOPHAGEAL BANDING     Patient location during evaluation: Endoscopy Anesthesia Type: MAC Level of consciousness: awake and alert Pain management: pain level controlled Vital Signs Assessment: post-procedure vital signs reviewed and stable Respiratory status: spontaneous breathing, nonlabored ventilation, respiratory function stable and patient connected to nasal cannula oxygen Cardiovascular status: stable and blood pressure returned to baseline Postop Assessment: no apparent nausea or vomiting Anesthetic complications: no    Last Vitals:  Vitals:   09/21/18 1130 09/21/18 1140  BP: 124/73 112/78  Pulse: 65 65  Resp: 17 17  Temp:    SpO2: 100% 100%    Last Pain:  Vitals:   09/21/18 1140  TempSrc:   PainSc: 0-No pain                 Ryan P Ellender

## 2018-09-21 NOTE — Transfer of Care (Signed)
Immediate Anesthesia Transfer of Care Note  Patient: Mitchell [name]  Procedure(s) Performed: ESOPHAGOGASTRODUODENOSCOPY (EGD) WITH PROPOFOL (N/A ) ESOPHAGEAL BANDING  Patient Location: Endoscopy Unit  Anesthesia Type:MAC  Level of Consciousness: drowsy  Airway & Oxygen Therapy: Patient Spontanous Breathing and Patient connected to face mask  Post-op Assessment: Report given to RN and Post -op Vital signs reviewed and stable  Post vital signs: Reviewed and stable  Last Vitals:  Vitals Value Taken Time  BP 122/79 09/21/18 1107  Temp 36.5 C 09/21/18 1105  Pulse 57 09/21/18 1108  Resp 15 09/21/18 1108  SpO2 100 % 09/21/18 1108  Vitals shown include unvalidated device data.  Last Pain:  Vitals:   09/21/18 1105  TempSrc: Temporal  PainSc: Asleep         Complications: No apparent anesthesia complications

## 2018-09-21 NOTE — Anesthesia Procedure Notes (Signed)
Procedure Name: MAC Date/Time: 09/21/2018 10:45 AM Performed by: Anne Fu, CRNA Oxygen Delivery Method: Nasal cannula
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
## 2018-09-21 NOTE — Op Note (Signed)
[facility] [place] Patient Name: Mitchell [name] Procedure Date: 09/21/2018 MRN: [id number] Attending MD: Rachael Fee , MD Date of Birth: 12-31-1975 CSN: [id number] Age: 43 Admit Type: Outpatient Procedure:                Upper GI endoscopy Indications:              cirrhosis, esophageal varices noted 07/2018 during                            EGD for overt GI bleeding, felt likely due to the                            varices Providers:                Rachael Fee, MD, Dwain Sarna, RN, Roslynn Amble, RN, Kandice Robinsons, Technician Referring MD:              Medicines:                Monitored Anesthesia Care Complications:             Estimated Blood Loss:     Estimated blood loss: none. Procedure:                Pre-Anesthesia Assessment:                           - Prior to the procedure, a History and Physical                            was performed, and patient medications and                            allergies were reviewed. The patient's tolerance of                            previous anesthesia was also reviewed. The risks                            and benefits of the procedure and the sedation                            options and risks were discussed with the patient.                            All questions were answered, and informed consent                            was obtained. Prior Anticoagulants: The patient has                            taken no previous anticoagulant or antiplatelet  agents. ASA Grade Assessment: IV - A patient with                            severe systemic disease that is a constant threat                            to life. After reviewing the risks and benefits,                            the patient was deemed in satisfactory condition to                            undergo the procedure.                           After obtaining informed consent, the endoscope was                             passed under direct vision. Throughout the                            procedure, the patient's blood pressure, pulse, and                            oxygen saturations were monitored continuously. The                            GIF-H190 ([id number] Olympus gastroscope was                            introduced through the mouth, and advanced to the                            second part of duodenum. The upper GI endoscopy was                            accomplished without difficulty. The patient                            tolerated the procedure well. Scope In: Scope Out: Findings:      Small (< 5 mm) non-bleeding varices were found in the lower third of the       esophagus. Three bands were successfully placed with complete       eradication, resulting in deflation of varices. There was no bleeding       during the maneuver.      Moderate portal gastropathy changes (proximal > distal).      No gastric varices.      The exam was otherwise without abnormality. Impression:               - Small distal esophagus varices, banded today with                            3 ligating bands.                           -  Moderate portal gastropathy. Moderate Sedation:      Not Applicable - Patient had care per Anesthesia. Recommendation:           - Patient has a contact number available for                            emergencies. The signs and symptoms of potential                            delayed complications were discussed with the                            patient. Return to normal activities tomorrow.                            Written discharge instructions were provided to the                            patient.                           - Resume previous diet.                           - Continue present medications.                           - Return office visit in 1-2 months with Dr. Ardis Hughs                            (office will call you). Procedure  Code(s):        --- Professional ---                           [phone], Esophagogastroduodenoscopy, flexible,                            transoral; with band ligation of esophageal/gastric                            varices Diagnosis Code(s):        --- Professional ---                           I85.00, Esophageal varices without bleeding                           R10.13, Epigastric pain CPT copyright 2019 American Medical Association. All rights reserved. The codes documented in this report are preliminary and upon coder review may  be revised to meet current compliance requirements. Milus Banister, MD 09/21/2018 11:06:48 AM This report has been signed electronically. Number of Addenda: 0

## 2018-09-21 NOTE — Discharge Instructions (Signed)
Ligadura de vrices esofgicas Esophageal Variceal Ligation  La ligadura de vrices esofgicas (LVE) es un procedimiento quirrgico para tratar o prevenir el sangrado en el tubo que conecta la garganta con el estmago (esfago). El procedimiento tambin se llama cerclaje de vrices esofgicas. Es posible que necesite este procedimiento si se le forman venas hinchadas (vrices) en el interior del esfago. Estas venas pueden romperse y Therapist, art. Puede estar en riesgo de tener sangrado de vrices esofgicas si tiene enfermedad heptica avanzada. En este procedimiento, un cirujano utiliza un dispositivo flexible denominado endoscopio para observar las vrices en el esfago. A travs del endoscopio, se colocan bandas elsticas alrededor de las vrices para interrumpir la irrigacin sangunea. Con el tiempo, esto causa la cicatrizacin y [place] a Scientist, water quality o Engineer, water. Este procedimiento generalmente se repite en el plazo de 1 a 2 semanas. Pueden ser necesarios 3 o 4 procedimientos para tratar todas las vrices. Informe al pediatra del nio acerca de lo siguiente:  Cualquier alergia que tenga.  Todos los [employer], incluidos vitaminas, hierbas, gotas oftlmicas, cremas y medicamentos de venta libre.  Cualquier problema previo que usted o algn miembro de su familia haya tenido con los anestsicos.  Cualquier trastorno de la sangre que tenga.  Cirugas a las que se haya sometido.  Cualquier afeccin mdica que tenga.  Si est embarazada o podra estarlo. Cules son los riesgos? En general, se trata de un procedimiento seguro. Sin embargo, pueden ocurrir complicaciones, por ejemplo:  Sangrado.  Infeccin.  Llagas (lceras) donde se colocaron las bandas.  Un agujero en Education administrator (perforacin).  Dolor temporal en el pecho.  Dificultad temporal para tragar (disfagia).  Disfagia a largo plazo a causa de la formacin de tejido cicatricial y Pharmacist, hospital del  esfago. Qu ocurre antes del procedimiento? Hidratacin Siga las indicaciones del mdico acerca de mantenerse hidratado, las cuales pueden incluir lo siguiente:  Hasta 2horas antes del procedimiento, puede beber lquidos transparentes, como agua, jugos de fruta sin pulpa, caf negro y t solo. Restricciones en las comidas y bebidas Siga las instrucciones del mdico respecto de las restricciones de comidas o bebidas, las cuales pueden incluir lo siguiente:  8 horas antes del procedimiento, deje de ingerir comidas o alimentos pesados, por ejemplo, carne, alimentos fritos o alimentos grasos.  6 horas antes del procedimiento, deje de ingerir comidas o alimentos livianos, como tostadas o cereales.  6 horas antes del procedimiento, deje de tomar leche o bebidas que [employer].  2 horas antes del procedimiento, deje de beber lquidos transparentes. Medicamentos Consulte al mdico sobre:  Quarry manager o suspender los medicamentos que toma habitualmente. Esto es muy importante si toma medicamentos para la diabetes o anticoagulantes.  Tomar medicamentos como aspirina e ibuprofeno. Estos medicamentos pueden tener un efecto anticoagulante en la [place]. No tome estos medicamentos a menos que el mdico se lo indique.  Tomar medicamentos de [employer], vitaminas, hierbas y suplementos. Informacin general  Se lo revisar con un endoscopio (endoscopa) para observar las vrices esofgicas.  Es posible que deban realizarle un anlisis de sangre para Neurosurgeon su grupo sanguneo en caso de que necesite una transfusin de [place].  Haga que alguien lo lleve a su casa desde el hospital o la clnica.  Haga que un adulto responsable lo cuide durante al menos 24horas despus de que le den el alta del hospital o de la [place]. Esto es importante.  Pregntele al mdico qu medidas se tomarn para prevenir una infeccin. Esto puede incluir tomar antibiticos.  Qu ocurre durante el procedimiento?  Le  colocarn una va intravenosa en una de las venas.  Le administrarn uno o ms de los siguientes medicamentos: ? Un medicamento para ayudarlo a relajarse (sedante). ? Un medicamento que lo har dormir (anestesia general).  Cuando est dormido o relajado, el cirujano le introducir el endoscopio a travs de la boca, por la garganta, Teacher, adult educationhasta llegar al esfago.  Una cmara que tiene el endoscopio enviar imgenes a un monitor. El cirujano tambin podr observar el esfago por medio del endoscopio.  Cuando la punta del endoscopio llegue a la zona donde el esfago se une con el [place], el cirujano utilizar un dispositivo introducido a travs del endoscopio para comenzar a Scientific laboratory techniciancolocar las bandas en las vrices.  En general, se colocan bandas a entre 5 y 10 vrices, empezando desde el [address] y Sports coachavanzando hacia la garganta.  El endoscopio se retirar cuando se hayan colocado las bandas. Este procedimiento puede variar segn el mdico y el hospital. Ladell HeadsQu ocurre despus del procedimiento?  Le controlarn la presin arterial, la frecuencia cardaca, la frecuencia respiratoria y Air cabin crewel nivel de oxgeno en la sangre hasta que le den el alta del hospital o la clnica.  Si no tiene sangrado, podr volver a su casa pronto despus del procedimiento.  No conduzca durante 24horas si le administraron un sedante durante el procedimiento.  Es posible que le den medicamento recetado para ayudar a Printmakerprevenir el sangrado (betabloqueante). Resumen  La ligadura de vrices esofgicas (LVE) es un procedimiento quirrgico para tratar o prevenir el sangrado en el tubo que conecta la garganta con el estmago (esfago).  Es posible que necesite este procedimiento si se le forman venas hinchadas (vrices) en el interior del esfago.  En este procedimiento, a travs de un endoscopio, se colocan bandas elsticas alrededor de las vrices para interrumpir la irrigacin sangunea.  Siga las indicaciones del mdico acerca de cmo  prepararse para este procedimiento.  Si no tiene sangrado, podr volver a su casa pronto despus del procedimiento. Esta informacin no tiene Theme park managercomo fin reemplazar el consejo del mdico. Asegrese de hacerle al mdico cualquier pregunta que tenga. Document Released: 05/20/2017 Document Revised: 05/20/2017 Document Reviewed: 05/20/2017 Elsevier Patient Education  2020 [employer]

## 2018-09-21 NOTE — Interval H&P Note (Signed)
History and Physical Interval Note:  09/21/2018 8:25 AM  Mitchell [name]  has presented today for surgery, with the diagnosis of varices.  The various methods of treatment have been discussed with the patient and family. After consideration of risks, benefits and other options for treatment, the patient has consented to  Procedure(s) with comments: ESOPHAGOGASTRODUODENOSCOPY (EGD) WITH PROPOFOL (N/A) - vrices banding as a surgical intervention.  The patient's history has been reviewed, patient examined, no change in status, stable for surgery.  I have reviewed the patient's chart and labs.  Questions were answered to the patient's satisfaction.     Milus Banister

## 2018-10-02 ENCOUNTER — Other Ambulatory Visit: Payer: Self-pay | Admitting: Student in an Organized Health Care Education/Training Program

## 2018-10-12 NOTE — Progress Notes (Signed)
This encounter was created in error - please disregard.

## 2018-10-27 ENCOUNTER — Ambulatory Visit: Payer: Self-pay | Admitting: Gastroenterology

## 2018-11-30 ENCOUNTER — Ambulatory Visit (INDEPENDENT_AMBULATORY_CARE_PROVIDER_SITE_OTHER): Payer: Self-pay | Admitting: Primary Care
